# Patient Record
Sex: Female | Born: 1945
Health system: Southern US, Community
[De-identification: ages and names within clinical notes are randomized; demographics above are authoritative.]

## PROBLEM LIST (undated history)

## (undated) DIAGNOSIS — F32A Depression, unspecified: Secondary | ICD-10-CM

## (undated) DIAGNOSIS — K7460 Unspecified cirrhosis of liver: Secondary | ICD-10-CM

## (undated) DIAGNOSIS — M1990 Unspecified osteoarthritis, unspecified site: Secondary | ICD-10-CM

## (undated) DIAGNOSIS — B182 Chronic viral hepatitis C: Secondary | ICD-10-CM

## (undated) DIAGNOSIS — E785 Hyperlipidemia, unspecified: Secondary | ICD-10-CM

## (undated) DIAGNOSIS — F329 Major depressive disorder, single episode, unspecified: Secondary | ICD-10-CM

## (undated) DIAGNOSIS — E119 Type 2 diabetes mellitus without complications: Secondary | ICD-10-CM

## (undated) DIAGNOSIS — I10 Essential (primary) hypertension: Secondary | ICD-10-CM

## (undated) HISTORY — PX: ABDOMINAL HYSTERECTOMY: SHX81

## (undated) HISTORY — PX: BREAST SURGERY: SHX581

## (undated) HISTORY — DX: Hyperlipidemia, unspecified: E78.5

## (undated) HISTORY — PX: CHOLECYSTECTOMY: SHX55

## (undated) HISTORY — DX: Type 2 diabetes mellitus without complications: E11.9

## (undated) HISTORY — PX: APPENDECTOMY: SHX54

## (undated) HISTORY — DX: Unspecified cirrhosis of liver: K74.60

---

## 2001-01-28 ENCOUNTER — Emergency Department (HOSPITAL_COMMUNITY): Admission: EM | Admit: 2001-01-28 | Discharge: 2001-01-28 | Payer: Self-pay | Admitting: Emergency Medicine

## 2001-01-28 ENCOUNTER — Encounter: Payer: Self-pay | Admitting: Emergency Medicine

## 2001-10-07 ENCOUNTER — Encounter: Payer: Self-pay | Admitting: Emergency Medicine

## 2001-10-07 ENCOUNTER — Emergency Department (HOSPITAL_COMMUNITY): Admission: EM | Admit: 2001-10-07 | Discharge: 2001-10-07 | Payer: Self-pay | Admitting: Emergency Medicine

## 2002-03-28 ENCOUNTER — Encounter: Payer: Self-pay | Admitting: Emergency Medicine

## 2002-03-28 ENCOUNTER — Emergency Department (HOSPITAL_COMMUNITY): Admission: EM | Admit: 2002-03-28 | Discharge: 2002-03-28 | Payer: Self-pay | Admitting: Emergency Medicine

## 2003-04-14 ENCOUNTER — Encounter: Payer: Self-pay | Admitting: Emergency Medicine

## 2003-04-14 ENCOUNTER — Emergency Department (HOSPITAL_COMMUNITY): Admission: EM | Admit: 2003-04-14 | Discharge: 2003-04-14 | Payer: Self-pay | Admitting: Emergency Medicine

## 2004-09-15 ENCOUNTER — Emergency Department (HOSPITAL_COMMUNITY): Admission: EM | Admit: 2004-09-15 | Discharge: 2004-09-15 | Payer: Self-pay | Admitting: Emergency Medicine

## 2005-01-07 ENCOUNTER — Emergency Department (HOSPITAL_COMMUNITY): Admission: EM | Admit: 2005-01-07 | Discharge: 2005-01-07 | Payer: Self-pay | Admitting: Emergency Medicine

## 2005-08-17 ENCOUNTER — Emergency Department (HOSPITAL_COMMUNITY): Admission: EM | Admit: 2005-08-17 | Discharge: 2005-08-17 | Payer: Self-pay | Admitting: Emergency Medicine

## 2005-09-09 ENCOUNTER — Emergency Department (HOSPITAL_COMMUNITY): Admission: EM | Admit: 2005-09-09 | Discharge: 2005-09-10 | Payer: Self-pay | Admitting: Emergency Medicine

## 2005-11-08 ENCOUNTER — Emergency Department (HOSPITAL_COMMUNITY): Admission: EM | Admit: 2005-11-08 | Discharge: 2005-11-08 | Payer: Self-pay | Admitting: Emergency Medicine

## 2005-11-15 ENCOUNTER — Emergency Department (HOSPITAL_COMMUNITY): Admission: EM | Admit: 2005-11-15 | Discharge: 2005-11-15 | Payer: Self-pay | Admitting: Emergency Medicine

## 2005-12-26 ENCOUNTER — Emergency Department (HOSPITAL_COMMUNITY): Admission: EM | Admit: 2005-12-26 | Discharge: 2005-12-26 | Payer: Self-pay | Admitting: Emergency Medicine

## 2006-02-15 ENCOUNTER — Emergency Department (HOSPITAL_COMMUNITY): Admission: EM | Admit: 2006-02-15 | Discharge: 2006-02-15 | Payer: Self-pay | Admitting: Emergency Medicine

## 2006-04-04 ENCOUNTER — Emergency Department (HOSPITAL_COMMUNITY): Admission: EM | Admit: 2006-04-04 | Discharge: 2006-04-04 | Payer: Self-pay | Admitting: Emergency Medicine

## 2006-05-15 ENCOUNTER — Inpatient Hospital Stay (HOSPITAL_COMMUNITY): Admission: EM | Admit: 2006-05-15 | Discharge: 2006-05-30 | Payer: Self-pay | Admitting: Emergency Medicine

## 2006-05-23 ENCOUNTER — Ambulatory Visit: Payer: Self-pay | Admitting: Cardiology

## 2006-05-31 ENCOUNTER — Inpatient Hospital Stay (HOSPITAL_COMMUNITY): Admission: EM | Admit: 2006-05-31 | Discharge: 2006-06-06 | Payer: Self-pay | Admitting: Emergency Medicine

## 2006-05-31 ENCOUNTER — Encounter (HOSPITAL_COMMUNITY): Admission: RE | Admit: 2006-05-31 | Discharge: 2006-05-31 | Payer: Self-pay | Admitting: General Surgery

## 2006-05-31 ENCOUNTER — Ambulatory Visit: Payer: Self-pay | Admitting: Internal Medicine

## 2006-06-07 ENCOUNTER — Encounter (HOSPITAL_COMMUNITY): Admission: RE | Admit: 2006-06-07 | Discharge: 2006-07-07 | Payer: Self-pay | Admitting: General Surgery

## 2006-07-19 ENCOUNTER — Ambulatory Visit: Payer: Self-pay | Admitting: Gastroenterology

## 2006-07-24 ENCOUNTER — Ambulatory Visit (HOSPITAL_COMMUNITY): Admission: RE | Admit: 2006-07-24 | Discharge: 2006-07-24 | Payer: Self-pay | Admitting: Gastroenterology

## 2006-07-24 ENCOUNTER — Ambulatory Visit: Payer: Self-pay | Admitting: Internal Medicine

## 2006-11-05 ENCOUNTER — Emergency Department (HOSPITAL_COMMUNITY): Admission: EM | Admit: 2006-11-05 | Discharge: 2006-11-05 | Payer: Self-pay | Admitting: Emergency Medicine

## 2006-11-14 ENCOUNTER — Emergency Department (HOSPITAL_COMMUNITY): Admission: EM | Admit: 2006-11-14 | Discharge: 2006-11-14 | Payer: Self-pay | Admitting: Emergency Medicine

## 2007-01-29 ENCOUNTER — Emergency Department (HOSPITAL_COMMUNITY): Admission: EM | Admit: 2007-01-29 | Discharge: 2007-01-29 | Payer: Self-pay | Admitting: Emergency Medicine

## 2007-02-06 ENCOUNTER — Emergency Department (HOSPITAL_COMMUNITY): Admission: EM | Admit: 2007-02-06 | Discharge: 2007-02-06 | Payer: Self-pay | Admitting: Emergency Medicine

## 2007-02-26 ENCOUNTER — Emergency Department (HOSPITAL_COMMUNITY): Admission: EM | Admit: 2007-02-26 | Discharge: 2007-02-26 | Payer: Self-pay | Admitting: Emergency Medicine

## 2007-04-26 ENCOUNTER — Emergency Department (HOSPITAL_COMMUNITY): Admission: EM | Admit: 2007-04-26 | Discharge: 2007-04-26 | Payer: Self-pay | Admitting: Emergency Medicine

## 2007-07-31 ENCOUNTER — Ambulatory Visit (HOSPITAL_COMMUNITY): Admission: RE | Admit: 2007-07-31 | Discharge: 2007-07-31 | Payer: Self-pay | Admitting: Family Medicine

## 2007-11-09 ENCOUNTER — Emergency Department (HOSPITAL_COMMUNITY): Admission: EM | Admit: 2007-11-09 | Discharge: 2007-11-09 | Payer: Self-pay | Admitting: Emergency Medicine

## 2008-04-19 ENCOUNTER — Emergency Department (HOSPITAL_COMMUNITY): Admission: EM | Admit: 2008-04-19 | Discharge: 2008-04-19 | Payer: Self-pay | Admitting: Emergency Medicine

## 2008-06-10 ENCOUNTER — Emergency Department (HOSPITAL_COMMUNITY): Admission: EM | Admit: 2008-06-10 | Discharge: 2008-06-10 | Payer: Self-pay | Admitting: Emergency Medicine

## 2008-06-19 ENCOUNTER — Emergency Department (HOSPITAL_COMMUNITY): Admission: EM | Admit: 2008-06-19 | Discharge: 2008-06-19 | Payer: Self-pay | Admitting: Emergency Medicine

## 2008-09-15 ENCOUNTER — Emergency Department (HOSPITAL_COMMUNITY): Admission: EM | Admit: 2008-09-15 | Discharge: 2008-09-15 | Payer: Self-pay | Admitting: Emergency Medicine

## 2008-12-22 ENCOUNTER — Ambulatory Visit (HOSPITAL_COMMUNITY): Admission: RE | Admit: 2008-12-22 | Discharge: 2008-12-22 | Payer: Self-pay | Admitting: Family Medicine

## 2009-02-15 ENCOUNTER — Emergency Department (HOSPITAL_COMMUNITY): Admission: EM | Admit: 2009-02-15 | Discharge: 2009-02-15 | Payer: Self-pay | Admitting: Emergency Medicine

## 2009-04-14 ENCOUNTER — Inpatient Hospital Stay (HOSPITAL_COMMUNITY): Admission: EM | Admit: 2009-04-14 | Discharge: 2009-04-23 | Payer: Self-pay | Admitting: Emergency Medicine

## 2009-10-20 ENCOUNTER — Emergency Department (HOSPITAL_COMMUNITY): Admission: EM | Admit: 2009-10-20 | Discharge: 2009-10-20 | Payer: Self-pay | Admitting: Family Medicine

## 2009-10-28 ENCOUNTER — Other Ambulatory Visit: Admission: RE | Admit: 2009-10-28 | Discharge: 2009-10-28 | Payer: Self-pay | Admitting: Otolaryngology

## 2009-11-05 ENCOUNTER — Ambulatory Visit (HOSPITAL_COMMUNITY): Admission: RE | Admit: 2009-11-05 | Discharge: 2009-11-05 | Payer: Self-pay | Admitting: Otolaryngology

## 2009-11-12 ENCOUNTER — Other Ambulatory Visit: Admission: RE | Admit: 2009-11-12 | Discharge: 2009-11-12 | Payer: Self-pay | Admitting: Otolaryngology

## 2009-12-02 ENCOUNTER — Emergency Department (HOSPITAL_COMMUNITY): Admission: EM | Admit: 2009-12-02 | Discharge: 2009-12-02 | Payer: Self-pay | Admitting: Emergency Medicine

## 2009-12-15 ENCOUNTER — Emergency Department (HOSPITAL_COMMUNITY): Admission: EM | Admit: 2009-12-15 | Discharge: 2009-12-15 | Payer: Self-pay | Admitting: Emergency Medicine

## 2010-01-16 ENCOUNTER — Emergency Department (HOSPITAL_COMMUNITY): Admission: EM | Admit: 2010-01-16 | Discharge: 2010-01-16 | Payer: Self-pay | Admitting: Emergency Medicine

## 2010-02-07 ENCOUNTER — Emergency Department (HOSPITAL_COMMUNITY): Admission: EM | Admit: 2010-02-07 | Discharge: 2010-02-07 | Payer: Self-pay | Admitting: Emergency Medicine

## 2010-04-09 ENCOUNTER — Ambulatory Visit (HOSPITAL_COMMUNITY): Admission: RE | Admit: 2010-04-09 | Discharge: 2010-04-09 | Payer: Self-pay | Admitting: Otolaryngology

## 2010-04-23 ENCOUNTER — Emergency Department (HOSPITAL_COMMUNITY): Admission: EM | Admit: 2010-04-23 | Discharge: 2010-04-24 | Payer: Self-pay | Admitting: Emergency Medicine

## 2010-08-05 ENCOUNTER — Emergency Department (HOSPITAL_COMMUNITY)
Admission: EM | Admit: 2010-08-05 | Discharge: 2010-08-05 | Payer: Self-pay | Source: Home / Self Care | Admitting: Emergency Medicine

## 2010-10-03 ENCOUNTER — Emergency Department (HOSPITAL_COMMUNITY)
Admission: EM | Admit: 2010-10-03 | Discharge: 2010-10-03 | Disposition: A | Discharge status: Home / Self Care | Payer: Self-pay | Attending: Emergency Medicine | Admitting: Emergency Medicine

## 2010-10-03 DIAGNOSIS — T148XXA Other injury of unspecified body region, initial encounter: Secondary | ICD-10-CM | POA: Insufficient documentation

## 2010-10-03 DIAGNOSIS — I10 Essential (primary) hypertension: Secondary | ICD-10-CM | POA: Insufficient documentation

## 2010-10-03 DIAGNOSIS — F17200 Nicotine dependence, unspecified, uncomplicated: Secondary | ICD-10-CM | POA: Insufficient documentation

## 2010-10-03 DIAGNOSIS — Z8614 Personal history of Methicillin resistant Staphylococcus aureus infection: Secondary | ICD-10-CM | POA: Insufficient documentation

## 2010-10-03 DIAGNOSIS — Y92410 Unspecified street and highway as the place of occurrence of the external cause: Secondary | ICD-10-CM | POA: Insufficient documentation

## 2010-10-25 ENCOUNTER — Other Ambulatory Visit (HOSPITAL_COMMUNITY): Payer: Self-pay | Admitting: *Deleted

## 2010-10-25 DIAGNOSIS — Z139 Encounter for screening, unspecified: Secondary | ICD-10-CM

## 2010-10-26 ENCOUNTER — Ambulatory Visit (HOSPITAL_COMMUNITY): Payer: Self-pay

## 2010-11-04 ENCOUNTER — Emergency Department (HOSPITAL_COMMUNITY): Payer: Medicaid Other

## 2010-11-04 ENCOUNTER — Emergency Department (HOSPITAL_COMMUNITY)
Admission: EM | Admit: 2010-11-04 | Discharge: 2010-11-04 | Disposition: A | Discharge status: Home / Self Care | Payer: Medicaid Other | Attending: Emergency Medicine | Admitting: Emergency Medicine

## 2010-11-04 DIAGNOSIS — R071 Chest pain on breathing: Secondary | ICD-10-CM | POA: Insufficient documentation

## 2010-11-04 DIAGNOSIS — J45909 Unspecified asthma, uncomplicated: Secondary | ICD-10-CM | POA: Insufficient documentation

## 2010-11-04 DIAGNOSIS — M129 Arthropathy, unspecified: Secondary | ICD-10-CM | POA: Insufficient documentation

## 2010-11-04 DIAGNOSIS — R079 Chest pain, unspecified: Secondary | ICD-10-CM | POA: Insufficient documentation

## 2010-11-04 DIAGNOSIS — K219 Gastro-esophageal reflux disease without esophagitis: Secondary | ICD-10-CM | POA: Insufficient documentation

## 2010-11-04 LAB — POCT CARDIAC MARKERS
CKMB, poc: 2.5 ng/mL (ref 1.0–8.0)
CKMB, poc: 1.8 ng/mL (ref 1.0–8.0)
CKMB, poc: 2.4 ng/mL (ref 1.0–8.0)
Myoglobin, poc: 120 ng/mL (ref 12–200)
Myoglobin, poc: 216 ng/mL (ref 12–200)
Myoglobin, poc: 358 ng/mL (ref 12–200)
Troponin i, poc: <0.05 ng/mL (ref 0.00–0.09)
Troponin i, poc: <0.05 ng/mL (ref 0.00–0.09)
Troponin i, poc: <0.05 ng/mL (ref 0.00–0.09)

## 2010-11-04 LAB — URINE CULTURE
Colony Count: 25000
Culture  Setup Time: 201109032002

## 2010-11-04 LAB — BASIC METABOLIC PANEL
BUN: 13 mg/dL (ref 6–23)
BUN: 17 mg/dL (ref 6–23)
CO2: 23 mEq/L (ref 19–32)
CO2: 26 mEq/L (ref 19–32)
Calcium: 8.7 mg/dL (ref 8.4–10.5)
Calcium: 8.9 mg/dL (ref 8.4–10.5)
Chloride: 103 mEq/L (ref 96–112)
Chloride: 109 mEq/L (ref 96–112)
Creatinine, Ser: 0.74 mg/dL (ref 0.4–1.2)
Creatinine, Ser: 1.31 mg/dL — ABNORMAL HIGH (ref 0.4–1.2)
GFR calc Af Amer: >60 mL/min (ref >60)
GFR calc Af Amer: 50 mL/min — ABNORMAL LOW (ref >60)
GFR calc non Af Amer: >60 mL/min (ref >60)
GFR calc non Af Amer: 41 mL/min — ABNORMAL LOW (ref >60)
Glucose, Bld: 108 mg/dL — ABNORMAL HIGH (ref 70–99)
Glucose, Bld: 102 mg/dL — ABNORMAL HIGH (ref 70–99)
Potassium: 3.3 mEq/L — ABNORMAL LOW (ref 3.5–5.1)
Potassium: 3.7 mEq/L (ref 3.5–5.1)
Sodium: 136 mEq/L (ref 135–145)
Sodium: 140 mEq/L (ref 135–145)

## 2010-11-04 LAB — CBC
HCT: 39.4 % (ref 36.0–46.0)
HCT: 39.4 % (ref 36.0–46.0)
Hemoglobin: 13.1 g/dL (ref 12.0–15.0)
Hemoglobin: 13.1 g/dL (ref 12.0–15.0)
MCH: 29.2 pg (ref 26.0–34.0)
MCH: 30 pg (ref 26.0–34.0)
MCHC: 33.2 g/dL (ref 30.0–36.0)
MCHC: 33.2 g/dL (ref 30.0–36.0)
MCV: 87.9 fL (ref 78.0–100.0)
MCV: 90.6 fL (ref 78.0–100.0)
Platelets: 258 10*3/uL (ref 150–400)
Platelets: 266 10*3/uL (ref 150–400)
RBC: 4.48 MIL/uL (ref 3.87–5.11)
RBC: 4.34 MIL/uL (ref 3.87–5.11)
RDW: 12 % (ref 11.5–15.5)
RDW: 12.7 % (ref 11.5–15.5)
WBC: 7.9 10*3/uL (ref 4.0–10.5)
WBC: 15.7 10*3/uL — ABNORMAL HIGH (ref 4.0–10.5)

## 2010-11-04 LAB — DIFFERENTIAL
Basophils Absolute: 0 10*3/uL (ref 0.0–0.1)
Basophils Absolute: 0 10*3/uL (ref 0.0–0.1)
Basophils Relative: 0 % (ref 0–1)
Basophils Relative: 0 % (ref 0–1)
Eosinophils Absolute: 0.2 10*3/uL (ref 0.0–0.7)
Eosinophils Absolute: 0.2 10*3/uL (ref 0.0–0.7)
Eosinophils Relative: 1 % (ref 0–5)
Eosinophils Relative: 3 % (ref 0–5)
Lymphocytes Relative: 20 % (ref 12–46)
Lymphocytes Relative: 38 % (ref 12–46)
Lymphs Abs: 3 10*3/uL (ref 0.7–4.0)
Lymphs Abs: 3.1 10*3/uL (ref 0.7–4.0)
Monocytes Absolute: 0.9 10*3/uL (ref 0.1–1.0)
Monocytes Absolute: 1.4 10*3/uL — ABNORMAL HIGH (ref 0.1–1.0)
Monocytes Relative: 11 % (ref 3–12)
Monocytes Relative: 9 % (ref 3–12)
Neutro Abs: 11.1 10*3/uL — ABNORMAL HIGH (ref 1.7–7.7)
Neutro Abs: 3.8 10*3/uL (ref 1.7–7.7)
Neutrophils Relative %: 48 % (ref 43–77)
Neutrophils Relative %: 70 % (ref 43–77)

## 2010-11-04 LAB — URINALYSIS, ROUTINE W REFLEX MICROSCOPIC
Bilirubin Urine: NEGATIVE
Glucose, UA: NEGATIVE mg/dL
Hgb urine dipstick: NEGATIVE
Nitrite: NEGATIVE
Protein, ur: NEGATIVE mg/dL
Specific Gravity, Urine: 1.025 (ref 1.005–1.030)
Urobilinogen, UA: 0.2 mg/dL (ref 0.0–1.0)
pH: 5.5 (ref 5.0–8.0)

## 2010-11-09 LAB — COMPREHENSIVE METABOLIC PANEL
ALT: 40 U/L — ABNORMAL HIGH (ref 0–35)
AST: 43 U/L — ABNORMAL HIGH (ref 0–37)
Albumin: 3.2 g/dL — ABNORMAL LOW (ref 3.5–5.2)
Alkaline Phosphatase: 106 U/L (ref 39–117)
BUN: 8 mg/dL (ref 6–23)
CO2: 26 mEq/L (ref 19–32)
Calcium: 8.8 mg/dL (ref 8.4–10.5)
Chloride: 106 mEq/L (ref 96–112)
Creatinine, Ser: 0.81 mg/dL (ref 0.4–1.2)
GFR calc Af Amer: >60 mL/min (ref >60)
GFR calc non Af Amer: >60 mL/min (ref >60)
Glucose, Bld: 100 mg/dL — ABNORMAL HIGH (ref 70–99)
Potassium: 3.8 mEq/L (ref 3.5–5.1)
Sodium: 136 mEq/L (ref 135–145)
Total Bilirubin: 0.4 mg/dL (ref 0.3–1.2)
Total Protein: 8.5 g/dL — ABNORMAL HIGH (ref 6.0–8.3)

## 2010-11-09 LAB — DIFFERENTIAL
Basophils Absolute: 0 10*3/uL (ref 0.0–0.1)
Basophils Relative: 0 % (ref 0–1)
Eosinophils Absolute: 0.3 10*3/uL (ref 0.0–0.7)
Eosinophils Relative: 3 % (ref 0–5)
Lymphocytes Relative: 16 % (ref 12–46)
Lymphs Abs: 1.8 10*3/uL (ref 0.7–4.0)
Monocytes Absolute: 1.1 10*3/uL — ABNORMAL HIGH (ref 0.1–1.0)
Monocytes Relative: 10 % (ref 3–12)
Neutro Abs: 7.9 10*3/uL — ABNORMAL HIGH (ref 1.7–7.7)
Neutrophils Relative %: 71 % (ref 43–77)

## 2010-11-09 LAB — BLOOD GAS, ARTERIAL
Acid-Base Excess: 0.1 mmol/L (ref 0.0–2.0)
Bicarbonate: 24 mEq/L (ref 20.0–24.0)
FIO2: 0.21 %
O2 Saturation: 97.1 %
Patient temperature: 37
TCO2: 21.6 mmol/L (ref 0–100)
pCO2 arterial: 37.8 mmHg (ref 35.0–45.0)
pH, Arterial: 7.42 — ABNORMAL HIGH (ref 7.350–7.400)
pO2, Arterial: 84.9 mmHg (ref 80.0–100.0)

## 2010-11-09 LAB — TROPONIN I: Troponin I: 0.01 ng/mL (ref 0.00–0.06)

## 2010-11-09 LAB — CBC
HCT: 36.2 % (ref 36.0–46.0)
Hemoglobin: 12.5 g/dL (ref 12.0–15.0)
MCHC: 34.6 g/dL (ref 30.0–36.0)
MCV: 88.8 fL (ref 78.0–100.0)
Platelets: 217 10*3/uL (ref 150–400)
RBC: 4.07 MIL/uL (ref 3.87–5.11)
RDW: 12.3 % (ref 11.5–15.5)
WBC: 11.1 10*3/uL — ABNORMAL HIGH (ref 4.0–10.5)

## 2010-11-09 LAB — D-DIMER, QUANTITATIVE: D-Dimer, Quant: 0.34 ug/mL-FEU (ref 0.00–0.48)

## 2010-11-09 LAB — URINALYSIS, ROUTINE W REFLEX MICROSCOPIC
Bilirubin Urine: NEGATIVE
Glucose, UA: NEGATIVE mg/dL
Hgb urine dipstick: NEGATIVE
Ketones, ur: NEGATIVE mg/dL
Nitrite: NEGATIVE
Protein, ur: NEGATIVE mg/dL
Specific Gravity, Urine: 1.025 (ref 1.005–1.030)
Urobilinogen, UA: 0.2 mg/dL (ref 0.0–1.0)
pH: 5.5 (ref 5.0–8.0)

## 2010-11-09 LAB — BRAIN NATRIURETIC PEPTIDE: Pro B Natriuretic peptide (BNP): 92.1 pg/mL (ref 0.0–100.0)

## 2010-11-09 LAB — CK TOTAL AND CKMB (NOT AT ARMC)
CK, MB: 5.3 ng/mL — ABNORMAL HIGH (ref 0.3–4.0)
Relative Index: 1.3 (ref 0.0–2.5)
Total CK: 415 U/L — ABNORMAL HIGH (ref 7–177)

## 2010-11-10 LAB — CBC
HCT: 35.6 % — ABNORMAL LOW (ref 36.0–46.0)
Hemoglobin: 12.4 g/dL (ref 12.0–15.0)
MCHC: 34.7 g/dL (ref 30.0–36.0)
MCV: 89.1 fL (ref 78.0–100.0)
Platelets: 222 10*3/uL (ref 150–400)
RBC: 4 MIL/uL (ref 3.87–5.11)
RDW: 12.4 % (ref 11.5–15.5)
WBC: 10.1 10*3/uL (ref 4.0–10.5)

## 2010-11-10 LAB — BASIC METABOLIC PANEL
BUN: 12 mg/dL (ref 6–23)
CO2: 27 mEq/L (ref 19–32)
Calcium: 8.6 mg/dL (ref 8.4–10.5)
Chloride: 105 mEq/L (ref 96–112)
Creatinine, Ser: 0.82 mg/dL (ref 0.4–1.2)
GFR calc Af Amer: >60 mL/min (ref >60)
GFR calc non Af Amer: >60 mL/min (ref >60)
Glucose, Bld: 130 mg/dL — ABNORMAL HIGH (ref 70–99)
Potassium: 2.7 mEq/L — CL (ref 3.5–5.1)
Sodium: 138 mEq/L (ref 135–145)

## 2010-11-10 LAB — POCT CARDIAC MARKERS
CKMB, poc: 2.5 ng/mL (ref 1.0–8.0)
CKMB, poc: 3.6 ng/mL (ref 1.0–8.0)
Myoglobin, poc: 161 ng/mL (ref 12–200)
Myoglobin, poc: 178 ng/mL (ref 12–200)
Troponin i, poc: <0.05 ng/mL (ref 0.00–0.09)
Troponin i, poc: <0.05 ng/mL (ref 0.00–0.09)

## 2010-11-10 LAB — DIFFERENTIAL
Basophils Absolute: 0 10*3/uL (ref 0.0–0.1)
Basophils Relative: 0 % (ref 0–1)
Eosinophils Absolute: 0.3 10*3/uL (ref 0.0–0.7)
Eosinophils Relative: 3 % (ref 0–5)
Lymphocytes Relative: 34 % (ref 12–46)
Lymphs Abs: 3.5 10*3/uL (ref 0.7–4.0)
Monocytes Absolute: 0.7 10*3/uL (ref 0.1–1.0)
Monocytes Relative: 7 % (ref 3–12)
Neutro Abs: 5.7 10*3/uL (ref 1.7–7.7)
Neutrophils Relative %: 56 % (ref 43–77)

## 2010-11-15 LAB — POCT RAPID STREP A (OFFICE): Streptococcus, Group A Screen (Direct): NEGATIVE

## 2010-11-26 LAB — DIFFERENTIAL
Basophils Absolute: 0 10*3/uL (ref 0.0–0.1)
Basophils Absolute: 0 10*3/uL (ref 0.0–0.1)
Basophils Relative: 0 % (ref 0–1)
Basophils Relative: 0 % (ref 0–1)
Eosinophils Absolute: 0.5 10*3/uL (ref 0.0–0.7)
Eosinophils Absolute: 0.6 10*3/uL (ref 0.0–0.7)
Eosinophils Relative: 11 % — ABNORMAL HIGH (ref 0–5)
Eosinophils Relative: 12 % — ABNORMAL HIGH (ref 0–5)
Lymphocytes Relative: 16 % (ref 12–46)
Lymphocytes Relative: 19 % (ref 12–46)
Lymphs Abs: 0.8 10*3/uL (ref 0.7–4.0)
Lymphs Abs: 1 10*3/uL (ref 0.7–4.0)
Monocytes Absolute: 0.2 10*3/uL (ref 0.1–1.0)
Monocytes Absolute: 0.4 10*3/uL (ref 0.1–1.0)
Monocytes Relative: 4 % (ref 3–12)
Monocytes Relative: 8 % (ref 3–12)
Neutro Abs: 3.2 10*3/uL (ref 1.7–7.7)
Neutro Abs: 3.4 10*3/uL (ref 1.7–7.7)
Neutrophils Relative %: 63 % (ref 43–77)
Neutrophils Relative %: 68 % (ref 43–77)

## 2010-11-26 LAB — CBC
HCT: 26.8 % — ABNORMAL LOW (ref 36.0–46.0)
HCT: 27.3 % — ABNORMAL LOW (ref 36.0–46.0)
Hemoglobin: 9.1 g/dL — ABNORMAL LOW (ref 12.0–15.0)
Hemoglobin: 9.3 g/dL — ABNORMAL LOW (ref 12.0–15.0)
MCHC: 34 g/dL (ref 30.0–36.0)
MCHC: 34.2 g/dL (ref 30.0–36.0)
MCV: 94.1 fL (ref 78.0–100.0)
MCV: 94.3 fL (ref 78.0–100.0)
Platelets: 249 10*3/uL (ref 150–400)
Platelets: 251 10*3/uL (ref 150–400)
RBC: 2.84 MIL/uL — ABNORMAL LOW (ref 3.87–5.11)
RBC: 2.9 MIL/uL — ABNORMAL LOW (ref 3.87–5.11)
RDW: 14.1 % (ref 11.5–15.5)
RDW: 14.5 % (ref 11.5–15.5)
WBC: 5 10*3/uL (ref 4.0–10.5)
WBC: 5.1 10*3/uL (ref 4.0–10.5)

## 2010-11-26 LAB — COMPREHENSIVE METABOLIC PANEL
ALT: 17 U/L (ref 0–35)
AST: 22 U/L (ref 0–37)
Albumin: 2.6 g/dL — ABNORMAL LOW (ref 3.5–5.2)
Alkaline Phosphatase: 58 U/L (ref 39–117)
BUN: 9 mg/dL (ref 6–23)
CO2: 21 mEq/L (ref 19–32)
Calcium: 8.4 mg/dL (ref 8.4–10.5)
Chloride: 108 mEq/L (ref 96–112)
Creatinine, Ser: 0.93 mg/dL (ref 0.4–1.2)
GFR calc Af Amer: >60 mL/min (ref >60)
GFR calc non Af Amer: >60 mL/min (ref >60)
Glucose, Bld: 101 mg/dL — ABNORMAL HIGH (ref 70–99)
Potassium: 4.1 mEq/L (ref 3.5–5.1)
Sodium: 136 mEq/L (ref 135–145)
Total Bilirubin: 0.3 mg/dL (ref 0.3–1.2)
Total Protein: 7.4 g/dL (ref 6.0–8.3)

## 2010-11-26 LAB — BASIC METABOLIC PANEL
BUN: 9 mg/dL (ref 6–23)
CO2: 20 mEq/L (ref 19–32)
Calcium: 8.2 mg/dL — ABNORMAL LOW (ref 8.4–10.5)
Chloride: 105 mEq/L (ref 96–112)
Creatinine, Ser: 0.97 mg/dL (ref 0.4–1.2)
GFR calc Af Amer: >60 mL/min (ref >60)
GFR calc non Af Amer: 58 mL/min — ABNORMAL LOW (ref >60)
Glucose, Bld: 160 mg/dL — ABNORMAL HIGH (ref 70–99)
Potassium: 3.5 mEq/L (ref 3.5–5.1)
Sodium: 133 mEq/L — ABNORMAL LOW (ref 135–145)

## 2010-11-27 LAB — BLOOD GAS, ARTERIAL
Acid-base deficit: 4.1 mmol/L — ABNORMAL HIGH (ref 0.0–2.0)
Bicarbonate: 19.8 mEq/L — ABNORMAL LOW (ref 20.0–24.0)
O2 Content: 8 L/min
O2 Saturation: 98.9 %
Patient temperature: 37
TCO2: 18.3 mmol/L (ref 0–100)
pCO2 arterial: 32.1 mmHg — ABNORMAL LOW (ref 35.0–45.0)
pH, Arterial: 7.407 — ABNORMAL HIGH (ref 7.350–7.400)
pO2, Arterial: 122 mmHg — ABNORMAL HIGH (ref 80.0–100.0)

## 2010-11-27 LAB — CBC
HCT: 21.8 % — ABNORMAL LOW (ref 36.0–46.0)
HCT: 24.8 % — ABNORMAL LOW (ref 36.0–46.0)
HCT: 25.3 % — ABNORMAL LOW (ref 36.0–46.0)
HCT: 25.3 % — ABNORMAL LOW (ref 36.0–46.0)
HCT: 25.7 % — ABNORMAL LOW (ref 36.0–46.0)
HCT: 26 % — ABNORMAL LOW (ref 36.0–46.0)
HCT: 27.4 % — ABNORMAL LOW (ref 36.0–46.0)
HCT: 27.5 % — ABNORMAL LOW (ref 36.0–46.0)
HCT: 27.9 % — ABNORMAL LOW (ref 36.0–46.0)
Hemoglobin: 7.5 g/dL — CL (ref 12.0–15.0)
Hemoglobin: 8.5 g/dL — ABNORMAL LOW (ref 12.0–15.0)
Hemoglobin: 8.6 g/dL — ABNORMAL LOW (ref 12.0–15.0)
Hemoglobin: 8.8 g/dL — ABNORMAL LOW (ref 12.0–15.0)
Hemoglobin: 8.8 g/dL — ABNORMAL LOW (ref 12.0–15.0)
Hemoglobin: 8.8 g/dL — ABNORMAL LOW (ref 12.0–15.0)
Hemoglobin: 9.4 g/dL — ABNORMAL LOW (ref 12.0–15.0)
Hemoglobin: 9.4 g/dL — ABNORMAL LOW (ref 12.0–15.0)
Hemoglobin: 9.6 g/dL — ABNORMAL LOW (ref 12.0–15.0)
MCHC: 33.9 g/dL (ref 30.0–36.0)
MCHC: 34 g/dL (ref 30.0–36.0)
MCHC: 34.2 g/dL (ref 30.0–36.0)
MCHC: 34.2 g/dL (ref 30.0–36.0)
MCHC: 34.2 g/dL (ref 30.0–36.0)
MCHC: 34.3 g/dL (ref 30.0–36.0)
MCHC: 34.3 g/dL (ref 30.0–36.0)
MCHC: 34.4 g/dL (ref 30.0–36.0)
MCHC: 34.7 g/dL (ref 30.0–36.0)
MCV: 92.5 fL (ref 78.0–100.0)
MCV: 92.9 fL (ref 78.0–100.0)
MCV: 93.6 fL (ref 78.0–100.0)
MCV: 93.6 fL (ref 78.0–100.0)
MCV: 93.8 fL (ref 78.0–100.0)
MCV: 94 fL (ref 78.0–100.0)
MCV: 94 fL (ref 78.0–100.0)
MCV: 96 fL (ref 78.0–100.0)
MCV: 96 fL (ref 78.0–100.0)
Platelets: 177 10*3/uL (ref 150–400)
Platelets: 179 10*3/uL (ref 150–400)
Platelets: 183 10*3/uL (ref 150–400)
Platelets: 185 10*3/uL (ref 150–400)
Platelets: 191 10*3/uL (ref 150–400)
Platelets: 205 10*3/uL (ref 150–400)
Platelets: 226 10*3/uL (ref 150–400)
Platelets: 243 10*3/uL (ref 150–400)
Platelets: 252 10*3/uL (ref 150–400)
RBC: 2.27 MIL/uL — ABNORMAL LOW (ref 3.87–5.11)
RBC: 2.64 MIL/uL — ABNORMAL LOW (ref 3.87–5.11)
RBC: 2.64 MIL/uL — ABNORMAL LOW (ref 3.87–5.11)
RBC: 2.73 MIL/uL — ABNORMAL LOW (ref 3.87–5.11)
RBC: 2.74 MIL/uL — ABNORMAL LOW (ref 3.87–5.11)
RBC: 2.77 MIL/uL — ABNORMAL LOW (ref 3.87–5.11)
RBC: 2.92 MIL/uL — ABNORMAL LOW (ref 3.87–5.11)
RBC: 2.96 MIL/uL — ABNORMAL LOW (ref 3.87–5.11)
RBC: 2.98 MIL/uL — ABNORMAL LOW (ref 3.87–5.11)
RDW: 14.1 % (ref 11.5–15.5)
RDW: 14.1 % (ref 11.5–15.5)
RDW: 14.3 % (ref 11.5–15.5)
RDW: 14.4 % (ref 11.5–15.5)
RDW: 14.5 % (ref 11.5–15.5)
RDW: 14.5 % (ref 11.5–15.5)
RDW: 14.8 % (ref 11.5–15.5)
RDW: 14.9 % (ref 11.5–15.5)
RDW: 15 % (ref 11.5–15.5)
WBC: 11.7 10*3/uL — ABNORMAL HIGH (ref 4.0–10.5)
WBC: 5.3 10*3/uL (ref 4.0–10.5)
WBC: 5.4 10*3/uL (ref 4.0–10.5)
WBC: 5.5 10*3/uL (ref 4.0–10.5)
WBC: 6.3 10*3/uL (ref 4.0–10.5)
WBC: 6.7 10*3/uL (ref 4.0–10.5)
WBC: 7 10*3/uL (ref 4.0–10.5)
WBC: 9.4 10*3/uL (ref 4.0–10.5)
WBC: 9.4 10*3/uL (ref 4.0–10.5)

## 2010-11-27 LAB — DIFFERENTIAL
Basophils Absolute: 0 10*3/uL (ref 0.0–0.1)
Basophils Absolute: 0 10*3/uL (ref 0.0–0.1)
Basophils Absolute: 0 10*3/uL (ref 0.0–0.1)
Basophils Absolute: 0 10*3/uL (ref 0.0–0.1)
Basophils Absolute: 0 10*3/uL (ref 0.0–0.1)
Basophils Absolute: 0 10*3/uL (ref 0.0–0.1)
Basophils Absolute: 0 10*3/uL (ref 0.0–0.1)
Basophils Absolute: 0 10*3/uL (ref 0.0–0.1)
Basophils Absolute: 0 10*3/uL (ref 0.0–0.1)
Basophils Relative: 0 % (ref 0–1)
Basophils Relative: 0 % (ref 0–1)
Basophils Relative: 0 % (ref 0–1)
Basophils Relative: 0 % (ref 0–1)
Basophils Relative: 0 % (ref 0–1)
Basophils Relative: 0 % (ref 0–1)
Basophils Relative: 0 % (ref 0–1)
Basophils Relative: 0 % (ref 0–1)
Basophils Relative: 0 % (ref 0–1)
Eosinophils Absolute: 0.1 10*3/uL (ref 0.0–0.7)
Eosinophils Absolute: 0.1 10*3/uL (ref 0.0–0.7)
Eosinophils Absolute: 0.1 10*3/uL (ref 0.0–0.7)
Eosinophils Absolute: 0.2 10*3/uL (ref 0.0–0.7)
Eosinophils Absolute: 0.3 10*3/uL (ref 0.0–0.7)
Eosinophils Absolute: 0.4 10*3/uL (ref 0.0–0.7)
Eosinophils Absolute: 0.5 10*3/uL (ref 0.0–0.7)
Eosinophils Absolute: 0.5 10*3/uL (ref 0.0–0.7)
Eosinophils Absolute: 0.6 10*3/uL (ref 0.0–0.7)
Eosinophils Relative: 1 % (ref 0–5)
Eosinophils Relative: 1 % (ref 0–5)
Eosinophils Relative: 1 % (ref 0–5)
Eosinophils Relative: 12 % — ABNORMAL HIGH (ref 0–5)
Eosinophils Relative: 2 % (ref 0–5)
Eosinophils Relative: 4 % (ref 0–5)
Eosinophils Relative: 7 % — ABNORMAL HIGH (ref 0–5)
Eosinophils Relative: 9 % — ABNORMAL HIGH (ref 0–5)
Eosinophils Relative: 9 % — ABNORMAL HIGH (ref 0–5)
Lymphocytes Relative: 10 % — ABNORMAL LOW (ref 12–46)
Lymphocytes Relative: 11 % — ABNORMAL LOW (ref 12–46)
Lymphocytes Relative: 15 % (ref 12–46)
Lymphocytes Relative: 15 % (ref 12–46)
Lymphocytes Relative: 16 % (ref 12–46)
Lymphocytes Relative: 17 % (ref 12–46)
Lymphocytes Relative: 21 % (ref 12–46)
Lymphocytes Relative: 27 % (ref 12–46)
Lymphocytes Relative: 7 % — ABNORMAL LOW (ref 12–46)
Lymphs Abs: 0.8 10*3/uL (ref 0.7–4.0)
Lymphs Abs: 0.8 10*3/uL (ref 0.7–4.0)
Lymphs Abs: 0.9 10*3/uL (ref 0.7–4.0)
Lymphs Abs: 0.9 10*3/uL (ref 0.7–4.0)
Lymphs Abs: 1 10*3/uL (ref 0.7–4.0)
Lymphs Abs: 1 10*3/uL (ref 0.7–4.0)
Lymphs Abs: 1.1 10*3/uL (ref 0.7–4.0)
Lymphs Abs: 1.1 10*3/uL (ref 0.7–4.0)
Lymphs Abs: 1.9 10*3/uL (ref 0.7–4.0)
Monocytes Absolute: 0.5 10*3/uL (ref 0.1–1.0)
Monocytes Absolute: 0.5 10*3/uL (ref 0.1–1.0)
Monocytes Absolute: 0.7 10*3/uL (ref 0.1–1.0)
Monocytes Absolute: 0.8 10*3/uL (ref 0.1–1.0)
Monocytes Absolute: 0.8 10*3/uL (ref 0.1–1.0)
Monocytes Absolute: 1 10*3/uL (ref 0.1–1.0)
Monocytes Absolute: 1.1 10*3/uL — ABNORMAL HIGH (ref 0.1–1.0)
Monocytes Absolute: 1.1 10*3/uL — ABNORMAL HIGH (ref 0.1–1.0)
Monocytes Absolute: 1.2 10*3/uL — ABNORMAL HIGH (ref 0.1–1.0)
Monocytes Relative: 10 % (ref 3–12)
Monocytes Relative: 10 % (ref 3–12)
Monocytes Relative: 11 % (ref 3–12)
Monocytes Relative: 12 % (ref 3–12)
Monocytes Relative: 12 % (ref 3–12)
Monocytes Relative: 14 % — ABNORMAL HIGH (ref 3–12)
Monocytes Relative: 15 % — ABNORMAL HIGH (ref 3–12)
Monocytes Relative: 9 % (ref 3–12)
Monocytes Relative: 9 % (ref 3–12)
Neutro Abs: 2.8 10*3/uL (ref 1.7–7.7)
Neutro Abs: 3.5 10*3/uL (ref 1.7–7.7)
Neutro Abs: 3.6 10*3/uL (ref 1.7–7.7)
Neutro Abs: 4 10*3/uL (ref 1.7–7.7)
Neutro Abs: 4.2 10*3/uL (ref 1.7–7.7)
Neutro Abs: 4.6 10*3/uL (ref 1.7–7.7)
Neutro Abs: 7.2 10*3/uL (ref 1.7–7.7)
Neutro Abs: 7.3 10*3/uL (ref 1.7–7.7)
Neutro Abs: 9.5 10*3/uL — ABNORMAL HIGH (ref 1.7–7.7)
Neutrophils Relative %: 52 % (ref 43–77)
Neutrophils Relative %: 57 % (ref 43–77)
Neutrophils Relative %: 65 % (ref 43–77)
Neutrophils Relative %: 66 % (ref 43–77)
Neutrophils Relative %: 67 % (ref 43–77)
Neutrophils Relative %: 69 % (ref 43–77)
Neutrophils Relative %: 76 % (ref 43–77)
Neutrophils Relative %: 77 % (ref 43–77)
Neutrophils Relative %: 81 % — ABNORMAL HIGH (ref 43–77)

## 2010-11-27 LAB — COMPREHENSIVE METABOLIC PANEL
ALT: 14 U/L (ref 0–35)
ALT: 15 U/L (ref 0–35)
ALT: 16 U/L (ref 0–35)
ALT: 16 U/L (ref 0–35)
ALT: 17 U/L (ref 0–35)
ALT: 18 U/L (ref 0–35)
ALT: 18 U/L (ref 0–35)
AST: 21 U/L (ref 0–37)
AST: 21 U/L (ref 0–37)
AST: 22 U/L (ref 0–37)
AST: 25 U/L (ref 0–37)
AST: 28 U/L (ref 0–37)
AST: 30 U/L (ref 0–37)
AST: 30 U/L (ref 0–37)
Albumin: 2.4 g/dL — ABNORMAL LOW (ref 3.5–5.2)
Albumin: 2.5 g/dL — ABNORMAL LOW (ref 3.5–5.2)
Albumin: 2.5 g/dL — ABNORMAL LOW (ref 3.5–5.2)
Albumin: 2.5 g/dL — ABNORMAL LOW (ref 3.5–5.2)
Albumin: 2.5 g/dL — ABNORMAL LOW (ref 3.5–5.2)
Albumin: 2.8 g/dL — ABNORMAL LOW (ref 3.5–5.2)
Albumin: 3.1 g/dL — ABNORMAL LOW (ref 3.5–5.2)
Alkaline Phosphatase: 58 U/L (ref 39–117)
Alkaline Phosphatase: 60 U/L (ref 39–117)
Alkaline Phosphatase: 62 U/L (ref 39–117)
Alkaline Phosphatase: 63 U/L (ref 39–117)
Alkaline Phosphatase: 65 U/L (ref 39–117)
Alkaline Phosphatase: 69 U/L (ref 39–117)
Alkaline Phosphatase: 75 U/L (ref 39–117)
BUN: 10 mg/dL (ref 6–23)
BUN: 11 mg/dL (ref 6–23)
BUN: 12 mg/dL (ref 6–23)
BUN: 12 mg/dL (ref 6–23)
BUN: 13 mg/dL (ref 6–23)
BUN: 14 mg/dL (ref 6–23)
BUN: 4 mg/dL — ABNORMAL LOW (ref 6–23)
CO2: 23 mEq/L (ref 19–32)
CO2: 23 mEq/L (ref 19–32)
CO2: 23 mEq/L (ref 19–32)
CO2: 23 mEq/L (ref 19–32)
CO2: 24 mEq/L (ref 19–32)
CO2: 26 mEq/L (ref 19–32)
CO2: 26 mEq/L (ref 19–32)
Calcium: 7.6 mg/dL — ABNORMAL LOW (ref 8.4–10.5)
Calcium: 7.9 mg/dL — ABNORMAL LOW (ref 8.4–10.5)
Calcium: 8 mg/dL — ABNORMAL LOW (ref 8.4–10.5)
Calcium: 8 mg/dL — ABNORMAL LOW (ref 8.4–10.5)
Calcium: 8 mg/dL — ABNORMAL LOW (ref 8.4–10.5)
Calcium: 8.1 mg/dL — ABNORMAL LOW (ref 8.4–10.5)
Calcium: 8.2 mg/dL — ABNORMAL LOW (ref 8.4–10.5)
Chloride: 102 mEq/L (ref 96–112)
Chloride: 102 mEq/L (ref 96–112)
Chloride: 102 mEq/L (ref 96–112)
Chloride: 104 mEq/L (ref 96–112)
Chloride: 104 mEq/L (ref 96–112)
Chloride: 105 mEq/L (ref 96–112)
Chloride: 105 mEq/L (ref 96–112)
Creatinine, Ser: 1.01 mg/dL (ref 0.4–1.2)
Creatinine, Ser: 1.05 mg/dL (ref 0.4–1.2)
Creatinine, Ser: 1.07 mg/dL (ref 0.4–1.2)
Creatinine, Ser: 1.11 mg/dL (ref 0.4–1.2)
Creatinine, Ser: 1.11 mg/dL (ref 0.4–1.2)
Creatinine, Ser: 1.35 mg/dL — ABNORMAL HIGH (ref 0.4–1.2)
Creatinine, Ser: 1.43 mg/dL — ABNORMAL HIGH (ref 0.4–1.2)
GFR calc Af Amer: 45 mL/min — ABNORMAL LOW (ref >60)
GFR calc Af Amer: 48 mL/min — ABNORMAL LOW (ref >60)
GFR calc Af Amer: >60 mL/min (ref >60)
GFR calc Af Amer: >60 mL/min (ref >60)
GFR calc Af Amer: >60 mL/min (ref >60)
GFR calc Af Amer: >60 mL/min (ref >60)
GFR calc Af Amer: >60 mL/min (ref >60)
GFR calc non Af Amer: 37 mL/min — ABNORMAL LOW (ref >60)
GFR calc non Af Amer: 40 mL/min — ABNORMAL LOW (ref >60)
GFR calc non Af Amer: 50 mL/min — ABNORMAL LOW (ref >60)
GFR calc non Af Amer: 50 mL/min — ABNORMAL LOW (ref >60)
GFR calc non Af Amer: 52 mL/min — ABNORMAL LOW (ref >60)
GFR calc non Af Amer: 53 mL/min — ABNORMAL LOW (ref >60)
GFR calc non Af Amer: 56 mL/min — ABNORMAL LOW (ref >60)
Glucose, Bld: 100 mg/dL — ABNORMAL HIGH (ref 70–99)
Glucose, Bld: 106 mg/dL — ABNORMAL HIGH (ref 70–99)
Glucose, Bld: 111 mg/dL — ABNORMAL HIGH (ref 70–99)
Glucose, Bld: 112 mg/dL — ABNORMAL HIGH (ref 70–99)
Glucose, Bld: 123 mg/dL — ABNORMAL HIGH (ref 70–99)
Glucose, Bld: 86 mg/dL (ref 70–99)
Glucose, Bld: 88 mg/dL (ref 70–99)
Potassium: 3.2 mEq/L — ABNORMAL LOW (ref 3.5–5.1)
Potassium: 3.4 mEq/L — ABNORMAL LOW (ref 3.5–5.1)
Potassium: 3.6 mEq/L (ref 3.5–5.1)
Potassium: 3.7 mEq/L (ref 3.5–5.1)
Potassium: 3.8 mEq/L (ref 3.5–5.1)
Potassium: 3.9 mEq/L (ref 3.5–5.1)
Potassium: 3.9 mEq/L (ref 3.5–5.1)
Sodium: 132 mEq/L — ABNORMAL LOW (ref 135–145)
Sodium: 132 mEq/L — ABNORMAL LOW (ref 135–145)
Sodium: 133 mEq/L — ABNORMAL LOW (ref 135–145)
Sodium: 133 mEq/L — ABNORMAL LOW (ref 135–145)
Sodium: 134 mEq/L — ABNORMAL LOW (ref 135–145)
Sodium: 135 mEq/L (ref 135–145)
Sodium: 135 mEq/L (ref 135–145)
Total Bilirubin: 0.4 mg/dL (ref 0.3–1.2)
Total Bilirubin: 0.5 mg/dL (ref 0.3–1.2)
Total Bilirubin: 0.7 mg/dL (ref 0.3–1.2)
Total Bilirubin: 0.7 mg/dL (ref 0.3–1.2)
Total Bilirubin: 0.8 mg/dL (ref 0.3–1.2)
Total Bilirubin: 0.8 mg/dL (ref 0.3–1.2)
Total Bilirubin: 1.2 mg/dL (ref 0.3–1.2)
Total Protein: 7.1 g/dL (ref 6.0–8.3)
Total Protein: 7.2 g/dL (ref 6.0–8.3)
Total Protein: 7.5 g/dL (ref 6.0–8.3)
Total Protein: 7.7 g/dL (ref 6.0–8.3)
Total Protein: 7.8 g/dL (ref 6.0–8.3)
Total Protein: 8.1 g/dL (ref 6.0–8.3)
Total Protein: 8.6 g/dL — ABNORMAL HIGH (ref 6.0–8.3)

## 2010-11-27 LAB — URINALYSIS, ROUTINE W REFLEX MICROSCOPIC
Bilirubin Urine: NEGATIVE
Glucose, UA: NEGATIVE mg/dL
Hgb urine dipstick: NEGATIVE
Ketones, ur: NEGATIVE mg/dL
Leukocytes, UA: NEGATIVE
Nitrite: POSITIVE — AB
Protein, ur: NEGATIVE mg/dL
Specific Gravity, Urine: 1.01 (ref 1.005–1.030)
Urobilinogen, UA: 1 mg/dL (ref 0.0–1.0)
pH: 5.5 (ref 5.0–8.0)

## 2010-11-27 LAB — CROSSMATCH
ABO/RH(D): O POS
Antibody Screen: NEGATIVE

## 2010-11-27 LAB — TROPONIN I: Troponin I: 0.03 ng/mL (ref 0.00–0.06)

## 2010-11-27 LAB — FERRITIN: Ferritin: 574 ng/mL — ABNORMAL HIGH (ref 10–291)

## 2010-11-27 LAB — CULTURE, BLOOD (ROUTINE X 2)
Culture: NO GROWTH
Culture: NO GROWTH
Culture: NO GROWTH
Culture: NO GROWTH
Culture: NO GROWTH
Culture: NO GROWTH
Report Status: 8282010
Report Status: 8282010
Report Status: 8302010
Report Status: 8302010
Report Status: 9032010
Report Status: 9052010

## 2010-11-27 LAB — URINALYSIS, MICROSCOPIC ONLY
Bilirubin Urine: NEGATIVE
Glucose, UA: NEGATIVE mg/dL
Hgb urine dipstick: NEGATIVE
Ketones, ur: NEGATIVE mg/dL
Leukocytes, UA: NEGATIVE
Nitrite: NEGATIVE
Protein, ur: NEGATIVE mg/dL
Specific Gravity, Urine: 1.01 (ref 1.005–1.030)
Urobilinogen, UA: 0.2 mg/dL (ref 0.0–1.0)
pH: 5 (ref 5.0–8.0)

## 2010-11-27 LAB — BASIC METABOLIC PANEL
BUN: 5 mg/dL — ABNORMAL LOW (ref 6–23)
BUN: 8 mg/dL (ref 6–23)
CO2: 21 mEq/L (ref 19–32)
CO2: 25 mEq/L (ref 19–32)
Calcium: 7.9 mg/dL — ABNORMAL LOW (ref 8.4–10.5)
Calcium: 8.2 mg/dL — ABNORMAL LOW (ref 8.4–10.5)
Chloride: 105 mEq/L (ref 96–112)
Chloride: 107 mEq/L (ref 96–112)
Creatinine, Ser: 1.03 mg/dL (ref 0.4–1.2)
Creatinine, Ser: 1.08 mg/dL (ref 0.4–1.2)
GFR calc Af Amer: >60 mL/min (ref >60)
GFR calc Af Amer: >60 mL/min (ref >60)
GFR calc non Af Amer: 52 mL/min — ABNORMAL LOW (ref >60)
GFR calc non Af Amer: 54 mL/min — ABNORMAL LOW (ref >60)
Glucose, Bld: 104 mg/dL — ABNORMAL HIGH (ref 70–99)
Glucose, Bld: 127 mg/dL — ABNORMAL HIGH (ref 70–99)
Potassium: 3.1 mEq/L — ABNORMAL LOW (ref 3.5–5.1)
Potassium: 3.7 mEq/L (ref 3.5–5.1)
Sodium: 132 mEq/L — ABNORMAL LOW (ref 135–145)
Sodium: 134 mEq/L — ABNORMAL LOW (ref 135–145)

## 2010-11-27 LAB — SEDIMENTATION RATE: Sed Rate: 82 mm/hr — ABNORMAL HIGH (ref 0–22)

## 2010-11-27 LAB — BRAIN NATRIURETIC PEPTIDE: Pro B Natriuretic peptide (BNP): 472 pg/mL — ABNORMAL HIGH (ref 0.0–100.0)

## 2010-11-27 LAB — VANCOMYCIN, TROUGH: Vancomycin Tr: 11.7 ug/mL (ref 10.0–20.0)

## 2010-11-27 LAB — URINE CULTURE
Colony Count: >=100000
Colony Count: NO GROWTH
Culture: NO GROWTH

## 2010-11-27 LAB — IRON AND TIBC
Iron: 16 ug/dL — ABNORMAL LOW (ref 42–135)
Saturation Ratios: 9 % — ABNORMAL LOW (ref 20–55)
TIBC: 176 ug/dL — ABNORMAL LOW (ref 250–470)
UIBC: 160 ug/dL

## 2010-11-27 LAB — TSH: TSH: 2.276 u[IU]/mL (ref 0.350–4.500)

## 2010-11-27 LAB — D-DIMER, QUANTITATIVE (NOT AT ARMC): D-Dimer, Quant: 0.86 ug/mL-FEU — ABNORMAL HIGH (ref 0.00–0.48)

## 2010-11-27 LAB — ANA: Anti Nuclear Antibody(ANA): POSITIVE — AB

## 2010-11-27 LAB — CARDIAC PANEL(CRET KIN+CKTOT+MB+TROPI)
CK, MB: 0.7 ng/mL (ref 0.3–4.0)
Relative Index: INVALID (ref 0.0–2.5)
Total CK: 44 U/L (ref 7–177)
Troponin I: 0.04 ng/mL (ref 0.00–0.06)

## 2010-11-27 LAB — URINE MICROSCOPIC-ADD ON

## 2010-11-27 LAB — PROTIME-INR
INR: 1.1 (ref 0.00–1.49)
Prothrombin Time: 14.2 seconds (ref 11.6–15.2)

## 2010-11-27 LAB — AMMONIA: Ammonia: 19 umol/L (ref 11–35)

## 2010-11-27 LAB — CK: Total CK: 40 U/L (ref 7–177)

## 2010-11-27 LAB — T4, FREE: Free T4: 0.65 ng/dL — ABNORMAL LOW (ref 0.80–1.80)

## 2010-11-27 LAB — APTT: aPTT: 37 seconds (ref 24–37)

## 2010-11-27 LAB — LACTIC ACID, PLASMA: Lactic Acid, Venous: 1.8 mmol/L (ref 0.5–2.2)

## 2010-11-27 LAB — MAGNESIUM: Magnesium: 1.6 mg/dL (ref 1.5–2.5)

## 2010-11-27 LAB — ANTI-NUCLEAR AB-TITER (ANA TITER): ANA Titer 1: NEGATIVE

## 2010-11-27 LAB — LIPASE, BLOOD: Lipase: 14 U/L (ref 11–59)

## 2010-11-29 LAB — COMPREHENSIVE METABOLIC PANEL
ALT: 14 U/L (ref 0–35)
AST: 22 U/L (ref 0–37)
Albumin: 2.9 g/dL — ABNORMAL LOW (ref 3.5–5.2)
Alkaline Phosphatase: 81 U/L (ref 39–117)
BUN: 7 mg/dL (ref 6–23)
CO2: 25 mEq/L (ref 19–32)
Calcium: 8.3 mg/dL — ABNORMAL LOW (ref 8.4–10.5)
Chloride: 104 mEq/L (ref 96–112)
Creatinine, Ser: 0.77 mg/dL (ref 0.4–1.2)
GFR calc Af Amer: >60 mL/min (ref >60)
GFR calc non Af Amer: >60 mL/min (ref >60)
Glucose, Bld: 143 mg/dL — ABNORMAL HIGH (ref 70–99)
Potassium: 3.2 mEq/L — ABNORMAL LOW (ref 3.5–5.1)
Sodium: 136 mEq/L (ref 135–145)
Total Bilirubin: 0.4 mg/dL (ref 0.3–1.2)
Total Protein: 8 g/dL (ref 6.0–8.3)

## 2010-11-29 LAB — DIFFERENTIAL
Basophils Absolute: 0 10*3/uL (ref 0.0–0.1)
Basophils Relative: 0 % (ref 0–1)
Eosinophils Absolute: 0 10*3/uL (ref 0.0–0.7)
Eosinophils Relative: 0 % (ref 0–5)
Lymphocytes Relative: 32 % (ref 12–46)
Lymphs Abs: 1.4 10*3/uL (ref 0.7–4.0)
Monocytes Absolute: 0.6 10*3/uL (ref 0.1–1.0)
Monocytes Relative: 13 % — ABNORMAL HIGH (ref 3–12)
Neutro Abs: 2.4 10*3/uL (ref 1.7–7.7)
Neutrophils Relative %: 55 % (ref 43–77)

## 2010-11-29 LAB — CBC
HCT: 25 % — ABNORMAL LOW (ref 36.0–46.0)
Hemoglobin: 8.5 g/dL — ABNORMAL LOW (ref 12.0–15.0)
MCHC: 34 g/dL (ref 30.0–36.0)
MCV: 99.2 fL (ref 78.0–100.0)
Platelets: 223 10*3/uL (ref 150–400)
RBC: 2.52 MIL/uL — ABNORMAL LOW (ref 3.87–5.11)
RDW: 14.1 % (ref 11.5–15.5)
WBC: 4.5 10*3/uL (ref 4.0–10.5)

## 2011-01-04 NOTE — Discharge Summary (Signed)
NAME:  Megan Odom, Megan Odom            ACCOUNT NO.:  [id number]   MEDICAL RECORD NO.:  [id number]          PATIENT TYPE:  INP   LOCATION:  A211                          FACILITY:  [facility]   PHYSICIAN:  Melissa L. Ladona Ridgel, MD  DATE OF BIRTH:  10/28/1945   DATE OF ADMISSION:  04/13/2009  DATE OF DISCHARGE:  09/02/2010LH                               DISCHARGE SUMMARY   DISCHARGE DIAGNOSES:  1. Urinary tract infection with sepsis.  2. Hepatitis C, off antiviral medications.  3. Anemia.  4. Chronic headache  5. Weakness.  6. Chronic obstructive pulmonary disease.  7. Fever of uncertain origin.  8. Right ovarian cyst.   CONSULTS:  None.   PERTINENT RADIOLOGICAL EXAMS:  Chest x-ray that was done on admission  that showed hyperaerated lungs consistent with an element of COPD.  She  had a CT of her abdomen and pelvis done on August 31 that showed a mass  in the right pelvis.  This was further evaluated with a transvaginal  ultrasound on September 1 that showed 1.9 cm cyst associated with right  ovary had benign imaging characteristics.  Would recommend follow-up  ultrasound and 1 year.   PROCEDURES:  1. On August 26 the patient had a PICC line placed without any      complications.  2. On August 24 the patient received 2 units of O positive packed red      blood cells without any adverse side effects.   HISTORY AND BRIEF HOSPITAL COURSE:  Ms. Megan Odom is a 7-  year-old [place] American female who is obese with a history of  hepatitis C who had been feeling poorly since she started her antiviral  therapy in February 2010.  She had presented to the [facility] Emergency  Department on the 23rd with lethargy, abdominal pain.  She had been  having some vomiting.  She was admitted, started on IV Rocephin  empirically for urinary tract infection.  The next day, she was still  appearing lethargic.  Over the course of the next 24 hours, she  developed hypotension, became septic and  was transferred to the ICU.  After a brief stay in the ICU, the patient's hypotension resolved.  Her  fevers began to decrease but she still had them intermittently.  She did  have some left lower quadrant abdominal pain that persisted.  A CT  abdomen and pelvis was done.  She was found to have a right pelvic mass.  Vaginal ultrasound was done to follow up.  She was found to have a right  ovarian cyst.  The rest of her hospital course will be based on her  problem list.  1. Urinary tract infection with sepsis.  Her first urinary culture      grew out E. coli.  She was treated with Rocephin initially, then      put on both vancomycin and Levaquin.  She will be discharged with 4      more days of Levaquin.  She had a 2nd set of urinary cultures done      4 days into her hospitalization that  did not grow any bacteria.  2. Hepatitis C.  When the patient was admitted, she was on ribavirin      and peginterferon. These were discontinued at admission secondary      to what we thought were side effects from her antiviral.  She will      be discharged off these medications.  3. Chronic anemia.  The patient was admitted with a hemoglobin of 8.6.      This subsequently dropped to 7.5 overnight.  She received 2 units      of packed red blood cells.  Her hemoglobin has remained between 8.8      and 9.4 for the rest of her hospital stay.  This morning,  at      discharge, she has a hemoglobin of 9.1 and an MCV value of 94.3.      Further iron studies were done during her stay.  These revealed a      serum iron of 16, TIBC 176, serum ferritin 574.  She is being      maintained on Procrit monthly as an outpatient.  4. COPD.  The patient does still smoke tobacco.  She received Atrovent      and Ventolin breathing treatments as needed while she was here.  It      is noteworthy that she did have an elevated BNP of 472 on August      24.  I wonder if her wheeze has a component of CHF in it but in       general her COPD remained stable over the course of her      hospitalization.  She will be discharged on p.r.n. breathing      treatments that she has at home without any oral steroids.  5. Fever.  The patient intermittently spiked fevers during her      hospital stay.  These were thought to be associated with her      urinary tract infection and sepsis.  Once her urinary tract      infection had cleared, these decreased; however, she did spike a      fever of about 100 intermittently until or vancomycin was      discontinued.  Since that time, her highest temperature has been      99.1.  She has had no fevers for the past 48 hours.  6. Ovarian cyst.  The patient was complaining of some lower abdominal      pain towards the end of her hospitalization.  It was decided to      check this with a CT abdomen and pelvis at which time we found a      right pelvic mass.  This was followed with a transvaginal      ultrasound, which revealed a right ovarian cyst with benign      characteristics.  The patient should have a follow-up ultrasound in      approximately 1 year.   OTHER PERTINENT LABS:  The patient was noted to have a positive ANA.  Blood cultures drawn during her hospital stay were negative.  She had a  TSH of 2.276 with a free T4 of 0.65.  This morning when I saw Ms.  Odom, she is lying in bed, comfortable but complaining of a  headache.  She tells me she is ready to go home.  She has been eating  well.  She is able to ambulate out in the hallways somewhat; however, if  she exerts up to much, she will become short of breath.  She is clear-  headed, alert, oriented, asking about her disability and financial  circumstances.  She tells me she is ready for discharge.   PHYSICAL EXAM:  GENERAL:  This is a 65 year old African American, obese  female who appears somewhat sluggish still.  VITAL SIGNS:  This morning are temperature 99.4, pulse 71, respirations  24, blood pressure is  115/60.  HEENT:  Head is atraumatic normocephalic.  Eyes have muddy sclera but  are anicteric.  Pupils are equal, round, reactive to light.  Her  conjunctivae are pink.  Nose shows no nasal discharge.  Mouth has very  poor dentition, as a matter of fact, she has a broken tooth that causes  or some difficulty but she has moist mucous membranes, no ulcerations or  lesions.  NECK:  Supple without lymphadenopathy or JVD.  CHEST:  Shows no accessory muscle use.  She does have some minimal  wheeze with deep expiration posteriorly.  HEART:  Regular rate and rhythm with normal S1-S2, no murmurs, rubs or  gallops appreciated.  ABDOMEN:  Her abdomen is obese, minimally tender in her left lower  quadrant with good bowel sounds.  No distention.  LOWER EXTREMITIES: Show no lesions,  minimal edema in her distal tibial  areas bilaterally.  She has 2+ dorsalis pedis pulses.  PSYCHIATRIC:  The  patient's affect is rather flat.  She appears to have some anxiety about  her chronic disease; however, her long and short-term memory are intact.  She has no suicidal ideations.   DISPOSITION:  She will be discharged home today in stable condition.   FOLLOW UP:  She has an appointment with [facility] infectious disease  specialists in [place] on September 17 at 2:40 p.m.  This is with  Dr. Adrian Saran.  She also has an appointment with Dierdre Forth who is  a PA at the [facility].  This will be within  the next 2 weeks.  She will need to have both her hepatitis C status  reviewed as well as her blood pressure and anemia status.   DISCHARGE MEDICATIONS:  1. Procrit 400,000 units 1 dose monthly  2. Multivitamin 1 tablet daily.  3. Neurontin 300 mg 3 tablets daily.  4. Tramadol HCl/acetaminophen 1 tablet 50 mg 3 times daily.  5. Alprazolam, this dose has been decreased to 0.25 mg 1 tablet q.12 h      as needed for anxiety.  6. Clarinex 5 mg 1 tablet daily.  7. Proventil inhaler  as needed for wheeze.  8. Furosemide 40 mg 1 tablet daily.  9. Nexium 40 mg 1 tablet daily.  10.Phenergan 25 mg 1/2 tablet q.6 h as needed for nausea.  11.Paxil 40 mg 1 tablet daily.  12.Norvasc 5 mg 1 tablet daily.  13.Atropine solution 1% drops 1 in each eye twice a day.  14.Omnipred 1% ophthalmic solution 1 drop in each eye daily.  15.Levaquin 500 mg 1 tablet daily for the next 4 days.  Note, the patient has been taken off her Diovan.  She has been taken off  her Pegasys 180 mEq/0.5 mL.  She has been taken off of her ribavirin.  She has also been taken off of her potassium and had her alprazolam dose  cut to a 1/4 of what it was previously.   Approximately 45 minutes was spent on this discharge summary.   Addendum:  I have seen and evaluated this  patient.  I discussed the  discharge plan with the physician's assitant and I concur with the above  plan.   Melissa L. Ladona Ridgel, M.D.      Stephani Police, PA      Melissa L. Ladona Ridgel, MD  Electronically Signed    MLY/MEDQ  D:  04/23/2009  T:  04/23/2009  Job:  [id number]   cc:   [facility] Dept. Dierdre Forth, PA   Dr. Jethro Bolus [facility]  Marcy Panning

## 2011-01-04 NOTE — H&P (Signed)
NAME:  Megan Odom, Megan Odom NO.:  [id number]   MEDICAL RECORD NO.:  [id number]          PATIENT TYPE:  OBV   LOCATION:  A305                          FACILITY:  [facility]   PHYSICIAN:  Renee Ramus, MD       DATE OF BIRTH:  04-25-1946   DATE OF ADMISSION:  04/13/2009  DATE OF DISCHARGE:  LH                              HISTORY & PHYSICAL   PRIMARY CARE PHYSICIANS:  1. Dr. Freida Busman from the [facility] Department.  2. Dr. Lequita Halt from the [facility] Department.   HISTORY OF PRESENT ILLNESS:  The patient is a 65 year old female, who  complains of fever, increased fatigue x1 week prior to admission.  The  patient also complains of headache, emotional liability, feet myalgias,  and weakness.  The patient has been taking ribavirin and peginterferon  for hepatitis C that she received from needle stick.  The patient has  been on therapy for bouts of three-quarters of a year.  She reports that  she has been having these problems, but they have been getting  progressively worse.  She denies chest pain, shortness of breath, PND,  or orthopnea.  She denies diarrhea or constipation.  The patient will be  admitted for overnight observation and cessation of ribavirin and  peginterferon.   PAST MEDICAL HISTORY:  1. Anemia.  2. Hypertension.  3. Obstructive sleep apnea.  4. COPD.  5. Hepatitis C.  6. Gastroesophageal reflux disease.  7. Obesity.   SOCIAL HISTORY:  The patient reports smoking one third to half a pack  per day.  She denies alcohol use.   FAMILY HISTORY:  Not available.   REVIEW OF SYSTEMS:  All other comprehensive review of systems are  negative.   MEDICATIONS:  The patient has allergies to DARVOCET, PENICILLIN, and  TETRACYCLINE.   CURRENT MEDICATIONS:  1. Procrit 40,000 units subcu weekly as directed.  2. Peginterferon subcu weekly.  3. Nexium 40 mg p.o. daily.  4. Lasix 40 mg p.o. daily.  5. Potassium chloride 10 mEq p.o. daily.  6.  Clarinex 5 mg p.o. daily.  7. Bactrim double strength 1 p.o. b.i.d.  8. Norvasc 5 mg p.o. daily.  9. Ribavirin 200 mg 3 pills in the morning and 3 pills at night p.o.  10.Premarin 0.625 mg p.o. daily.  11.Neurontin 300 mg p.o. t.i.d.  12.Diovan/hydrochlorothiazide 180/12.5 one p.o. daily.  13.Tramadol 50 mg p.o. t.i.d. p.r.n. pain.  14.Proventil 1 puff inhaled q.6 h p.r.n. wheezes.   PHYSICAL EXAMINATION:  GENERAL:  This is a morbidly obese black female,  currently no distress.  VITAL SIGNS:  Blood pressure 121/56, heart rate 86, respiratory rate 20,  and temperature 101.2.  HEENT:  No jugular venous distention.  No lymphadenopathy.  Oropharynx  is clear.  Mucous membranes pink and moist.  TMs clear bilaterally.  Pupils equal, reactive to light and accommodation.  Extraocular muscles  are intact.  CARDIOVASCULAR:  Regular rate and rhythm without murmurs, rubs, or  gallops.  PULMONARY:  Lungs are clear to auscultation bilaterally.  ABDOMEN:  Obese, nontender, and nondistended without hepatosplenomegaly.  Bowel sounds  are present.  She has no rebound or guarding.  EXTREMITIES:  She has no clubbing, cyanosis, or edema.  NEURO:  Cranial nerves II through XII are grossly intact.  She has no  focal neurological deficits.   LABORATORY DATA:  White count 9.4, H and H 8.6 and 5.3, MCV 96,  platelets 205.  Sodium 133, potassium 3.8, chloride 102, bicarb 26, BUN  14, creatinine 1.11, baseline creatinine 0.8, and glucose 106.  Urine  shows specific gravity of 1.026, positive for nitrites, and bacteria.   STUDIES:  Chest x-ray shows COPD, but no acute changes.   ASSESSMENT AND PLAN:  1. Fatigue, fever, emotional liability, myalgias, and weakness.  These      are all side effects to her peginterferon and ribavirin, we will      discontinue those.  Currently, we will keep for observation and      recheck her condition in the a.m.  2. Hypertension, currently stable.  The patient is slightly       hypotensive.  Upon repeat checks, we will hold her antihypertensive      medications.  3. Chronic obstructive pulmonary disease.  We will place the patient      on oxygen, check an ABG, and consider evaluation for home O2.  We      have also encouraged the patient not to smoke.  4. Hepatitis C.  We will discontinue ribavirin and peg as above.  The      patient does not show signs of active hepatitis C infection at this      time next.  5. Gastroesophageal reflux disease.  Continue proton pump inhibitor.  6. Dehydration.  We will give her intravenous fluids.  7. Anemia.  The patient may be suffering from aplastic anemia      secondary to her medications or she may have a iron-deficiency      anemia from on other cause.  We will check iron and ferritin.  Her      previous B12 and folate levels were fine.  We will hold EPO at this      time.  8. Urinary tract infection.  We will place the patient on Avelox,      given her intolerance to PENICILLINS.  9. Disposition.  We will keep the patient for observation likely      discharge in a.m.  H and P was constructed by reviewing past      medical history conferring with emergency medical room physician,      reviewing the emergency medical record.  Time spent 1 hour.      Renee Ramus, MD  Electronically Signed     JF/MEDQ  D:  04/13/2009  T:  04/14/2009  Job:  [id number]

## 2011-01-07 NOTE — Group Therapy Note (Signed)
NAME:  Megan Odom, Megan Odom            ACCOUNT NO.:  [id number]   MEDICAL RECORD NO.:  [id number]          PATIENT TYPE:  INP   LOCATION:  A339                          FACILITY:  [facility]   PHYSICIAN:  Margaretmary Dys, M.D.DATE OF BIRTH:  08-30-1945   DATE OF PROCEDURE:  05/28/2006  DATE OF DISCHARGE:                                   PROGRESS NOTE   SUBJECTIVE:  The patient continues to complain of pain, generalized body  aches and pain, especially in the axilla.  She has been requesting for pain  medication but does not think it is helping her that much.  She denies any  fevers but states she has had some chills overnight.  She still feels  generally weak and tired.   OBJECTIVE:  Conscious, alert, comfortable anxious-looking lady.  VITAL SIGNS:  Blood pressure was 122/59, pulse of 69, respiration of 20.  Temperature 99.4, oxygen saturation was 92% on room air.  HEENT:  Normocephalic, atraumatic.  Oral mucosa was moist with no exudate.  NECK:  Supple with no JVD.  LUNGS:  Clear equally.  Good air entry bilaterally.  HEART:  S1, S2 regular.  No S3 gallops or rubs.  ABDOMEN:  Soft, nontender.  Bowel sounds positive.  EXTREMITIES:  No edema.   LABORATORY/DIAGNOSTIC STUDIES:  White blood cell count was 3.3, hemoglobin  of 10.1, hematocrit 29.8, platelet count was 253,000 with no left shift.  Sodium was 130, potassium 4.1, chloride 101, co2 25, glucose of 85, BUN of  18, creatinine 0.9, calcium was 7.7.   ASSESSMENT:  1. Methicillin-resistant Staphylococcus aureus cellulitis with abscess of      the right axilla.  The patient is much better.  Will continue dressing      chronically on Doxycycline.  Bactrim was discontinued due to ulcers      that formed in her mouth.  She does not have any skin rash to suggest      Stevens-Johnson syndrome.  2. Pneumonia.  The patient currently remains afebrile and will remain on      Doxycycline.  The patient was previously on Levaquin.  This was  discontinued.   DISPOSITION:  Overall the patient is improving.  She continues to complain  of generalized weakness and pain, and does have some degree of anxiety.  Will continue on current treatment and I suspect she might be able to go  home in the next 1-2 days.      Margaretmary Dys, M.D.  Electronically Signed    AM/MEDQ  D:  05/28/2006  T:  05/28/2006  Job:  [id number]

## 2011-01-07 NOTE — H&P (Signed)
NAME:  Megan Odom, Megan Odom            ACCOUNT NO.:  [id number]   MEDICAL RECORD NO.:  [id number]          PATIENT TYPE:  INP   LOCATION:  A322                          FACILITY:  [facility]   PHYSICIAN:  Lonia Blood, M.D.      DATE OF BIRTH:  06-19-1946   DATE OF ADMISSION:  05/31/2006  DATE OF DISCHARGE:  LH                                HISTORY & PHYSICAL   PRIMARY CARE PHYSICIAN:  Is unassigned.   PRESENTING COMPLAINT:  Worsening infection and not feeling well.   HISTORY OF PRESENT ILLNESS:  The patient is a 65 year old female that was  just discharged yesterday from the hospital after admission with left chest  wall abscess that grew methicillin-resistant Staphylococcus aureus.  The  patient was on vancomycin, continued to spike fever.  She was also on  Bactrim and was having fever.  She was presumed to have antibiotic  associated fever as well.  She had an incision and drainage prior to being  discharged.  She went home yesterday and apparently was feeling bad.  She  continued to have fever, feels weak and decided to come to the emergency  room.  She was seen in the emergency room and subsequently is being  readmitted.   For past medical history, social history, medication, review of systems,  etc., please see my recent H&P as well as discharge summary by Osvaldo Shipper, MD yesterday.   PHYSICAL EXAMINATION:  VITAL SIGNS:  Temperature is 99.1, blood pressure  128/74, pulse 72 and respiratory rate 22, oxygen saturations 96% room air.  GENERAL:  She is awake, alert, oriented, in no acute distress.  HEENT:  Pupils equal, round and reactive to light and accommodation.  Extraocular movements are intact.  NECK:  Supple, no jugular venous distention noted.  LUNGS:  She has good air entry bilaterally, no wheezes or rales.  CARDIOVASCULAR:  S1, S2, no murmurs.  ABDOMEN:  Obese, nontender, positive bowel sounds.  EXTREMITIES:  No cyanosis, clubbing or edema.   Left chest wall and axilla  showed draining abscess.  Warm and red.  Not  clear if there is any change since late yesterday.   LABORATORY DATA:  White count 2200 with a left shift.  ANC 15.1.  Hemoglobin  is 9.7, platelet count 324,000.  Sodium 132, potassium 3.6, chloride 103,  CO2 23, glucose 108, BUN 20, creatinine 1.1, calcium 7.8.  Total protein  8.0, albumin is 2.3, AST 36, ALT 34, alkaline phosphatase 76, total  bilirubin 1.4.   ASSESSMENT:  A 65 year old female being readmitted with muscle abscess who  apparently went home and was not feeling well.  Her infection was also not  getting any better.   PLAN:  1. Methicillin-resistant Staphylococcus aureus abscess.  Will readmit the      patient.  Restart her vancomycin IV.      Try to transition her to something like doxycycline.  The patient is      apparently weak, so will hydrate her.  2. Pain control.  Will continue with her home medications at this point.      Since she  is being readmitted, we will resume all her previous orders      as they were yesterday prior to discharge.      Lonia Blood, M.D.  Electronically Signed     LG/MEDQ  D:  05/31/2006  T:  06/01/2006  Job:  [id number]

## 2011-01-07 NOTE — Discharge Summary (Signed)
NAME:  Megan Odom, Megan Odom NO.:  [id number]   MEDICAL RECORD NO.:  [id number]          PATIENT TYPE:  INP   LOCATION:  A339                          FACILITY:  [facility]   PHYSICIAN:  Megan Shipper, MD     DATE OF BIRTH:  24-Jan-1946   DATE OF ADMISSION:  05/31/2006  DATE OF DISCHARGE:  10/16/2007LH                                 DISCHARGE SUMMARY   PRIMARY CARE PHYSICIAN:  The patient does not have a PMD, she is requesting  Dr. Juanetta Odom.  An appointment was attempted to be made for this patient.   GENERAL SURGEON:  Megan Odom, M.D.   GASTROENTEROLOGIST:  Megan Odom, M.D.   DISCHARGE DIAGNOSIS:  1. Right axillary cellulitis and abscess status post incision and      drainage, Methicillin resistant Staphylococcus aureus infection stable.  2. Anemia of unclear etiology, stable.  3. Status post blood transfusion.  4. Hypokalemia requiring treatment.  5. Elevated liver function tests secondary to hepatitis C requiring follow      up.  6. Depression, stable.  7. Hypertension, stable.   Please review H&P dictated by Dr. Mikeal Odom regarding the patient's presenting  illness.  Please also review the discharge summary dictated by myself on  October 9 for details regarding the patient's previous hospitalization.   BRIEF HOSPITAL COURSE:  Briefly, this is a 65 year old African American  female who was discharged on October 9 after having a prolonged course for  right axillary cellulitis and abscess requiring I&D by Dr. Malvin Odom.  She  grew MRSA at that site, however, it was community acquired.  She pretty much  was complaining of profound weakness and malaise during the entire  hospitalization the previous time.  She also had a fever which was not  subsiding for which she underwent a complete evaluation.  The patient was  finally discharged even though she was running low grade temperatures.  She  even had an echo which did not show infectious endocarditis.   The  patient returned the following day because of recurrence of her fever.  She was admitted to the hospital and started on vancomycin.  She was also  given levofloxacin.  Slowly, however, the patient's fever subsided.  No  other etiology was found.  It was felt that she might have had a viral  syndrome which would account for her temperatures.  The patient started  feeling well.  Her p.o. intake improved and she was also able to ambulate,  well.  However, her hemoglobin was noted to be 7.8 on October 12.  She did  not have any overt bleeding, however, was complaining of some upper  abdominal discomfort.  Her occult blood testing, however, was negative.  Because of her anemia, she underwent an EGD and colonoscopy.  Her  colonoscopy was poor prep, hence, she will need a barium enema at some  point.  EGD showed some esophagitis, hence, PPI was recommended.  No other  lesions were found.  The patient was also transfused some blood over the  weekend.  Her H&H remained stable.   Hypokalemia was detected which was corrected with  the aid of potassium.  She  is on Lasix which is the reason for her hypokalemia.   She was also found to have elevated LFTs during the previous admission which  was thought to be related to medications as they were subsiding quickly.  However, at this time, also her LFTs were high which prompted acute  hepatitis profile which came back positive for hepatitis C.  Hence, she will  need follow up with gastroenterologist for further management of this issue.   On the day of discharge, the patient was feeling quite well.  She wanted to  go home, hence, we did not see any reason why not.   She was asked to continue all of the medications that were prescribed during  the previous discharge which included Doxycycline, Combivent, Norvasc,  Lasix, Neurontin, Nexium, Paxil, and potassium chloride.  She was also  prescribed Magic mouthwash during admission.   Follow up with Dr.  Juanetta Odom, with Dr. Malvin Odom.  Diet is heart healthy diet.  Physical activity with no restrictions.  She will need outpatient wound care  for her right axillary wound.   Consultation during this admission obtained from gastroenterologist and she  underwent an EGD and a colonoscopy.  Please review the dictated report on  these.      Megan Shipper, MD  Electronically Signed     GK/MEDQ  D:  06/06/2006  T:  06/06/2006  Job:  [id number]   cc:   Megan Odom, M.D.  Fax: [phone]   Megan Odom, M.D.  [address]  [place]  [place] [address]   Megan Odom, M.D.  Fax: [phone]

## 2011-01-07 NOTE — Discharge Summary (Signed)
NAME:  ABBAGAYLE, Megan Odom NO.:  [id number]   MEDICAL RECORD NO.:  [id number]          PATIENT TYPE:  INP   LOCATION:  A339                          FACILITY:  [facility]   PHYSICIAN:  Osvaldo Shipper, MD     DATE OF BIRTH:  1946/04/08   DATE OF ADMISSION:  05/15/2006  DATE OF DISCHARGE:  10/09/2007LH                                 DISCHARGE SUMMARY   Please review H&P dictated by Dr. Mikeal Hawthorne for details regarding patient's  present illness.   DISCHARGE DIAGNOSES:  1. Methicillin resistant Staphylococcus aureus cellulitis with abscess      formation in the right axilla, status post incision and drainage,      improved.  2. Right lower lobe pneumonia, improved.  3. Persistent fever of unclear etiology.  4. Hepatitis likely secondary to medications, improved.  5. Anemia, stable.  6. Hypertension, stable.  7. Possible obesity hypoventilation syndrome versus obstructive sleep      apnea requiring further evaluation.   BRIEF HOSPITAL COURSE:  PROBLEM #1 -  RIGHT AXILLARY ABSCESS:  This is a 65-  year-old [place] American female who presented complaining of pain in the  right chest wall region.  She underwent evaluation in the emergency  department which suggested cellulitis of the right axillary/right breast.  Because of the concern for MRSA, patient was started on vancomycin and  Levaquin.  Patient underwent a CAT scan of her chest which showed prominent  axillary lymph nodes thought to be likely reactive.  No abscess was noted on  the CAT scan.  Patient was continued on antibiotics, however, she continued  to spike temperatures with no improvement in symptoms.  Hence, Dr. Malvin Johns,  general surgeon, was consulted to evaluate this patient who felt that the  patient had some fluctuation.  Hence, he took her to the OR for I&D and  indeed abscess was drained.  The abscess was sent for culture sensitivity  which grew community acquired MRSA.  Patient was continued for about  7-8  days on vancomycin and then was switched over the doxycycline and Bactrim.  Patient developed some oral ulcerations to the Bactrim.  As a result, this  was discontinued.  Patient was discontinued on doxycycline as she had shown  considerable improvement in her wound healing.  Patient will follow with Dr.  Malvin Johns in about a week's time.  She will also need outpatient wound care  which will be set up for her.   PROBLEM #2 -  FEVER:  Patient continued to have fever despite being on  antibiotics.  Chest x-ray was subsequently done which showed a right lower  lobe infiltrate.  Patient was on Levaquin.  It was possible that the patient  also aspirated.  Patient was continued to monitor in the hospital.  The  subsequent x-ray showed clearing up of the air space disease.  Despite this,  the patient continued to have fever.  We obtained Doppler study of the lower  extremities that were negative for DVT.  We obtained multiple blood cultures  on this individual which all came back negative.  We finally gave  consideration  to the thought that antibiotics may somehow be responsible for  her fever, hence her vancomycin was also discontinued as she was switched  over to just p.o. medications.  Patient started showing some improvement.  However, when she was started on Bactrim, she again started spiking  temperatures and developed oral ulcers.  Hence, Bactrim was thought to be  the cause.  She did not develop any evidence of Stevens-Johnson Syndrome.  She did have elevation in her liver enzymes which also started trending  downwards.  Today her temperature was 100.6 and 100.7.  Patient has been  feeling well otherwise.  Hence, we felt that patient was safe enough to go  home.  She was told that if she develops high temperatures again, which was  not relieved with Tylenol, she needed to seek attention immediately.   PROBLEM #3 -  HEADACHE:  Patient started complaining of headache about 2-3  days  ago.  This prompted a CAT scan of the head which was unremarkable.  There were no signs or symptoms suggestive of meningitis in this individual.   PROBLEM #4 -  HEPATITIS:  As mentioned above, Bactrim was likely responsible  for acute elevation in her liver enzymes which also started trending  downwards.   PROBLEM #5 -  ANEMIA:  Remained stable in this individual. She did not  require transfusions.  Her ferritin was not measured.  Her B12 and folate  were okay.   Patient also was complaining of back pain and lower extremity pain. This  prompted initially x-ray of the lumbosacral spine which did not reveal any  acute findings, did suggest some disc degenerative changes.  Since she was  having some radicular pain, we obtained an MRI of the lumbar spine which  showed multilevel degenerative disc and facet disease changes in the L4-5  region with some bilateral neural foraminal narrowing in the L4-5 region  with question compression of the L4 root.  Small L5 central disc protrusion  was also appreciated.  Patient was, however, able to ambulate.  She did not  have any neurological manifestations.  She was told that she would need to  exercise regularly.  She will need to lose some weight and she will also  benefit from physical therapy.  PT did see the patient, however, patient was  not very motivated.  She was also told that if her pain persists or gets  worse or if she experiences weakness in her lower extremities, she would  benefit from a neurosurgical consultation.  She was also complaining of hip  pain but x-ray did not suggest any abnormalities in the hip.   At times, patient would complain of soreness and aches all over the body.  She would appear to be very tearful, hence the question regarding her mood.  It appeared that the patient was depressed without any suicidal or homicidal thoughts.  She said she was having a lot of stress because of her work,  because of her home  environment and that her fiance was very sick.  Patient  was started on Paxil for these symptoms and she showed improvement in her  mood after about a week or so.   Other studies do not include a mammogram and ultrasound of the breast which  did not reveal any evidence for malignancy.  She also underwent a 2-D  echocardiogram because of her continuous fevers did not suggest any  endocarditis and it showed normal left ventricular size with mild concentric  hypertrophy  and normal regional and global function was noted.   DISCHARGE MEDICATIONS:  1. Doxycycline 100 mg b.i.d. for 10 days.  2. Combivent 2 puffs inhaled q.6h.  3. Norvasc 5 mg p.o. daily.  4. Lasix 40 mg p.o. daily.  5. Neurontin 300 mg daily.  6. Nexium 40 mg daily.  7. Paxil 20 mg daily.  8. Potassium chloride 10 mEq daily.   She was to otherwise continue her other outpatient medications as before.  She was asked to discontinue Celebrex for now.   FOLLOW UP:  1. Dr. Malvin Johns in one week.  2. Outpatient wound care as instructed by Dr. Malvin Johns.  3. Dr. Juanetta Gosling in 2-3 weeks.  Patient would like to see him as her primary      doctor.   DIET:  Heart-healthy diet.   ACTIVITY:  No restrictions are placed.   OTHER STUDIES ORDERED:  Sleep study with Dr. Ronal Fear office.  I do not  remember the exact date this has been scheduled for.   Sheets note patient at times would be mildly hypoxic.  She would appear to  be snoring at times.  Patient is a morbidly obese individual, hence  possibilities for OHS versus OSA are present.  Sleep study should help [place]  evaluate this further.   CONSULTATION:  Dr. Malvin Johns.   PROCEDURE:  I&D of her axillary abscess which grew MRSA.   Patient was also prescribed mupirocin ointment to bilateral nostrils for  five days for MRSA.   Discharge time was about 45 minutes.      Osvaldo Shipper, MD  Electronically Signed     GK/MEDQ  D:  05/30/2006  T:  05/30/2006  Job:  [id number]   cc:    Ramon Dredge L. Juanetta Gosling, M.D.  Fax: [phone]

## 2011-01-07 NOTE — Op Note (Signed)
NAME:  Megan Odom, Megan Odom            ACCOUNT NO.:  [id number]   MEDICAL RECORD NO.:  [id number]          PATIENT TYPE:  INP   LOCATION:  A339                          FACILITY:  [facility]   PHYSICIAN:  Lionel December, M.D.    DATE OF BIRTH:  January 25, 1946   DATE OF PROCEDURE:  06/05/2006  DATE OF DISCHARGE:  06/06/2006                                 OPERATIVE REPORT   PROCEDURE:  Esophagogastroduodenoscopy followed by colonoscopy.   INDICATIONS:  Hilda Lias is a 65 year old African American female with multiple  medical problems who has epigastric pain and intermittent hematochezia.  She  also has anemia and has required 2 units of PRBCs.  Her anemia is felt to be  multifactorial and not solely on the basis of her hematochezia.  She could  have peptic ulcer disease as she has been using NSAIDs on an outpatient  basis.  The procedure risks were reviewed with the patient and informed  consent was obtained.   MEDS FOR CONSCIOUS SEDATION:  Benzocaine spray for pharyngeal topical  anesthesia, Demerol 50 mg IV, Versed 9 mg IV.   FINDINGS:  The procedure was performed in the endoscopy suite.  The  patient's vital signs and O2 sat were monitored during the procedure and  remained stable.   ESOPHAGOGASTRODUODENOSCOPY:  The patient was placed in the left lateral  position.  The Olympus videoscope was passed via oropharynx without any  difficulty into esophagus.   Esophagus:  The mucosa of the esophagus was normal except there was erythema  and friability at the GE junction and a non-critical ring.  The GE junction  was at 35 cm and hiatus was at 38.  No esophageal varices were noted.  Please note that the patient has HCV positive HC antibody and ultrasound  shows a fatty liver.   Stomach:  The stomach was empty and distended very well insufflation.  The  folds of the proximal stomach were normal.  Examination of the mucosa at  body, antrum, pyloric channel as well as angularis, fundus and cardia was  normal.   Duodenum:  The bulbar mucosa was normal.  The scope was passed to the second  part of the duodenum where mucosa and folds were normal.  The endoscope was  withdrawn and the patient prepared for procedure #2.   COLONOSCOPY:  Rectal examination was performed.  No abnormality noted on  external or digital exam.  The Olympus videoscope was placed in the rectum  and advanced under vision into the sigmoid colon and beyond.  The  preparation was fair.  She had lot of thick liquid stool and pieces of  formed stool.  With a lot of washing and suctioning, I was able to advance  the scope into the hepatic flexure and ascending colon.  However, cecal  landmarks were not seen.  As the scope was withdrawn, the colonic mucosa was  examined for the second time and no abnormalities noted.  The rectal mucosa  was normal.  The scope was retroflexed to examine anorectal junction and  small hemorrhoids were noted below the dentate line.  The endoscope was  straightened  and withdrawn.  The patient tolerated the procedure well.   FINAL DIAGNOSIS:  1. Small sliding hiatal hernia with mild changes of reflux esophagitis      limited to the GE junction and noncritical ring which was not      manipulated.  2. Small sliding hiatal hernia.  3. Normal exam of the stomach, first and second part of the duodenum.  4. Incomplete colonoscopy to hepatic flexure.  Exam was suboptimal because      of prep.   RECOMMENDATIONS:  1. She will have BE at a later date.  2. Continue anti-reflux measures and PPI.  3. She will have HCV, RNA by PCR quantitative and a genotype.      Lionel December, M.D.  Electronically Signed     NR/MEDQ  D:  06/05/2006  T:  06/06/2006  Job:  [id number]   cc:   Hanley Hays. Josefine Class, M.D.  Fax: [phone]

## 2011-01-07 NOTE — Group Therapy Note (Signed)
NAME:  Megan Odom, Megan Odom            ACCOUNT NO.:  [id number]   MEDICAL RECORD NO.:  [id number]          PATIENT TYPE:  INP   LOCATION:  A339                          FACILITY:  [facility]   PHYSICIAN:  Margaretmary Dys, M.D.DATE OF BIRTH:  Oct 17, 1945   DATE OF PROCEDURE:  DATE OF DISCHARGE:                                   PROGRESS NOTE   SUBJECTIVE:  Patient feels slightly better. She developed some sores in the  mouth yesterday after starting on Bactrim, says the sores are about the  same, Pain in the right axilla is better. She denies any fevers or chills.  No abdominal pain or chest pain.   OBJECTIVE:  GENERAL: Patient is alert, comfortable in no acute distress.  VITAL SIGNS: Blood pressure 122/73, pulse 72, respiration 24, temperature  98.5.  HEENT: Normocephalic atraumatic, patient has multiple ulcers on the tongue  and buccal mucosa.  There was no evidence of thrush noted.  NECK: Supple, no JVD.  LUNGS: Clear clinically with good air entry bilaterally.  HEART: S1 and S3 regular, no S3, gallops or rubs.  ABDOMEN: Obese but soft.  EXTREMITIES: No edema.   Axilla shows abscess healing fairly well.   LABORATORY DATA:  White blood cell count was 3.3, hemoglobin 10.2,  hematocrit 30.2, platelet count 260 with no left shift, sodium 131,  potassium 4.3, chloride 102, C02 24, glucose 91, BUN 18, creatinine 1.0,  AST 119,  ALT of 63. Total protein 6.9, albumin 2.3, calcium 7.9, urinalysis  was negative.   ASSESSMENT & PLAN:  Mr. Lenhard has methicillin-resistant Staphylococcus  aureus (MRSA) abscess of her right axilla, the patient has done very well  and is responding to antibiotics.  She is status post incision and drainage.  She is currently only on Doxycycline as the Bactrim was discontinued  yesterday due to developing mouth sores. There was concern for possible  Levonne Spiller Syndrome. I would observe patient for one to two more days  .  We anticipate discharge in the  next couple of days.      Margaretmary Dys, M.D.  Electronically Signed     AM/MEDQ  D:  05/27/2006  T:  05/28/2006  Job:  [id number]

## 2011-01-07 NOTE — Op Note (Signed)
NAME:  Megan Odom, DEPOLO NO.:  [id number]   MEDICAL RECORD NO.:  [id number]          PATIENT TYPE:  INP   LOCATION:  A339                          FACILITY:  [facility]   PHYSICIAN:  Barbaraann Barthel, M.D. DATE OF BIRTH:  [birth year]   DATE OF PROCEDURE:  05/18/2006  DATE OF DISCHARGE:                                 OPERATIVE REPORT   SURGICAL DIAGNOSIS:  Right axillary abscess.   NOTE:  This is a 65 year old black female, obese nondiabetic, who presented  to the medical service with cellulitis and an abscess pm the right axilla.  Surgery was consulted, and after antibiotics she was taken to surgery.  We  discussed complications, not limited to but including: bleeding, infection,  and the possibility more surgery may be needed.  Informed consent was  obtained.   PROCEDURE:  Incision and drainage, with debridement of right axillary  abscess.   SPECIMENS:  Cultures obtained for aerobic and anaerobic growth.   TECHNIQUE:  The patient was placed slightly on her left side and her right  axilla was prepped with Betadine solution and draped in the usual manner,  after endotracheal intubation.  An incision was carried out over the  fluctuant mass; obvious pus was removed and then necrotic tissue was  debrided.  Cultures were obtained.  We irrigated copiously with normal  saline solution, controlled the bleeding with a cautery device and packed  the wound open with saline-soaked 2-inch roll gauze.  A sterile dressing was  applied.  Prior to closure, all sponge, needle and instrument counts were  found be correct.  Estimated blood loss was minimal.  The patient received  approximately a liter of crystalloids intraoperatively.  There were no  complications.   DISPOSITION/FOLLOW-UP:  This patient will be returned to the medical service  and I will follow along with the physical therapy department (who had been  consulted for wound care).  This has been explained to  them.      Barbaraann Barthel, M.D.  Electronically Signed     WB/MEDQ  D:  05/18/2006  T:  05/20/2006  Job:  [id number]   cc:   Physical Therapy Department   Hospitalist

## 2011-01-07 NOTE — Group Therapy Note (Signed)
NAME:  Megan Odom, Megan Odom            ACCOUNT NO.:  [id number]   MEDICAL RECORD NO.:  [id number]          PATIENT TYPE:  INP   LOCATION:  A339                          FACILITY:  [facility]   PHYSICIAN:  Margaretmary Dys, M.D.DATE OF BIRTH:  [birth year]   DATE OF PROCEDURE:  05/21/2006  DATE OF DISCHARGE:                                   PROGRESS NOTE   SUBJECTIVE:  The patient is status post I&D for a right axillary abscess.  Was informed this morning that this patient is growing MRSA from the wound  cultures.  She denies any headaches.  No chest pain.  No fevers or chills.  She appears anxious.   OBJECTIVE:  GENERAL:  Anxious, but comfortable, no sign of acute distress.  VITAL SIGNS:  Blood pressure 173/54, pulse 76, respirations 26, T-max 98.3,  oxygen saturation 92% on 2 Liters.  HEENT:  Normocephalic, atraumatic.  Oral mucosa was moist with no exudates.  NECK:  Supple, no JVD, no lymphadenopathy.  LUNGS:  Reduced air entry bilaterally with some crackles at the right base.  HEART:  S1 and S2 regular.  ABDOMEN:  Soft, nontender.  Bowel sounds are positive.  AXILLA:  Wound appears clean and is packed.   LABORATORY DATA:  White blood cell count is improved to 7.8, hemoglobin  10.2, hematocrit 30.1, platelet count was 261, neutrophils of 79%, sodium  134, potassium 4.7, chloride 100, CO2 29, glucose 112, BUN 4, creatinine  0.8, calcium 8.  Wound culture is growing moderate Methicillin resistant Staphylococcus  aureus.   ASSESSMENT AND PLAN:  1. Methicillin resistant Staphylococcus aureus abscess of the right axilla      status post incision and drainage.  The patient does not appear septic,      has no fever.  Will continue her Vancomycin at this time.  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  Will continue treatment for the      infection at this time.  2. Hypertension.  The patient is stable.   Continue current medications.  3. Pneumonia with some element of chronic obstructive pulmonary disease.      Oxygen saturation is improved.  Will continue on Levaquin for now.  She      also likely has some component of obesity hypoventilation syndrome.   DISPOSITION:  The patient has a Methicillin resistant Staphylococcus aureus  abscess of her right axilla status post incision and drainage.  Will  continue on Vancomycin, continue Levaquin for pneumonia.      Margaretmary Dys, M.D.  Electronically Signed     AM/MEDQ  D:  05/21/2006  T:  05/21/2006  Job:  [id number]

## 2011-01-07 NOTE — Procedures (Signed)
NAME:  Megan Odom, Megan Odom NO.:  [id number]   MEDICAL RECORD NO.:  [id number]          PATIENT TYPE:  INP   LOCATION:  A339                          FACILITY:  [facility]   PHYSICIAN:  Gerrit Friends. Dietrich Pates, MD, FACCDATE OF BIRTH:  1946-03-17   DATE OF PROCEDURE:  05/23/2006  DATE OF DISCHARGE:                                  ECHOCARDIOGRAM   CLINICAL DATA:  This is a 65 year old gentleman with fever and sepsis.   FINDINGS:  Aorta is 3.1, left atrium is 4, septum is 1.4, posterior wall is  1.3, LV diastole is 4.5 and LV systole is 2.8.   IMPRESSION:  1. Technically adequate echocardiographic study.  2. Very mild left atrial enlargement; normal right atrium.  3. Normal right ventricular size and function; borderline right      ventricular hypertrophy.  4. Minimal aortic valvular sclerosis; mild calcification of the proximal      ascending aortic wall.  5. Normal mitral valve; mild annular calcification.  6. Normal tricuspid valve; physiologic regurgitation.  7. Normal left ventricular size; mild concentric hypertrophy; normal      regional and global function.      Gerrit Friends. Dietrich Pates, MD, [facility]  Electronically Signed     RMR/MEDQ  D:  05/23/2006  T:  05/24/2006  Job:  [id number]   cc:   Osvaldo Shipper, MD

## 2011-01-07 NOTE — Procedures (Signed)
   NAME:  JESSIA, KIEF                      ACCOUNT NO.:  [id number]   MEDICAL RECORD NO.:  [id number]                   PATIENT TYPE:  EMS   LOCATION:  ED                                   FACILITY:  [facility]   PHYSICIAN:  Fredirick Maudlin, M.D.              DATE OF BIRTH:  [birth year]   DATE OF PROCEDURE:  03/28/2002  DATE OF DISCHARGE:                                EKG INTERPRETATION   TIME:  1617 hours on March 28, 2002.  The rhythm is sinus rhythm with a rate  of about 70.  There is left atrial enlargement.  There are slow R waves  rushing across the precordium which may indicate a previous anterior  myocardial infarction. A clinical correlation is suggested.  There is also  ST elevation in V1, V2, V3, and V4 which could indicate some acute change,  and again clinical correlation is suggested.   IMPRESSION:  Abnormal electrocardiogram.                                               Fredirick Maudlin, M.D.    ELH/MEDQ  D:  03/28/2002  T:  03/31/2002  Job:  [id number]

## 2011-01-07 NOTE — Consult Note (Signed)
NAME:  Megan Odom, Megan Odom            ACCOUNT NO.:  [id number]   MEDICAL RECORD NO.:  [id number]          PATIENT TYPE:  INP   LOCATION:  A228                          FACILITY:  [facility]   PHYSICIAN:  Lionel December, M.D.    DATE OF BIRTH:  September 01, 1945   DATE OF CONSULTATION:  06/02/2006  DATE OF DISCHARGE:                                   CONSULTATION   REASON FOR CONSULTATION:  Anemia, elevated LFTs.   PHYSICIAN REQUESTING CONSULTATION:  [facility].   HISTORY OF PRESENT ILLNESS:  The patient is a 65 year old African-American  female who was hospitalized between September 24 through May 30, 2006,  with right axillary cellulitis secondary to MRSA.  The patient was  discharged on May 30, 2006, only to return the following days with  complaints of not feeling well.  She states she was feeling very weak.  She  has had fever within the last 24 hours.  She also reports postprandial upper  abdominal pain associated with nausea, which is worse.  She had some during  her last hospitalization as well.  She also has chronic GERD which is fairly  well controlled on Nexium 40 mg daily.  Denies any dysphagia, odynophagia.  She complains of diarrhea more recently, especially when she is receiving IV  vancomycin.  It is notable that she has not had any BMs in the last 24  hours.  She complains of small-volume hematochezia.  No melena.   With this readmission she has had a significant drop in her hemoglobin.  She  was generally in the 9-10 range and now today is 7.8.  There has been no  noted large overt GI bleeding.  It is also notable that she had elevated  LFTs during her recent hospitalization, predominantly transaminitis.  At the  time she was noted to have significant reaction to Biaxin with oral ulcers  and was felt that she might be having elevated LFTs related to medication.  On May 31, 2006, her LFTs were normal except albumin of 2.3 and total  bilirubin 1.4.  She also has a  normal B12 and folate level.   MEDICATIONS AT HOME:  1. Doxycycline 100 mg b.i.d.  2. Combivent two puffs inhaler q.6h.  3. Albuterol p.r.n.  4. Norvasc 5 mg daily.  5. Lasix 40 mg daily.  6. Neurontin 300 mg daily.  7. Nexium 40 mg daily.  8. Paxil 20 mg daily.  9. Potassium chloride 10 mEq daily.  10.Diovan HCT 160/12.5 mg daily.  11.Premarin 0.9 mg daily.  12.Allegra 180 mg daily.   ALLERGIES:  1. DARVOCET.  2. PENICILLIN.  3. Recently she developed itching with DILAUDID.   PAST MEDICAL HISTORY:  1. Arthritis.  2. Hypertension.  3. GERD.  4. Seasonal allergies.  5. Tobacco abuse.  6. Asthma.  7. MRSA cellulitis with abscess right axilla requiring I&D recently.  8. Recent right lower lobe pneumonia.  9. Anemia.  10.Hepatitis possibly related to Bactrim.  11.Degenerative disc disease.  12.Status post cholecystectomy and partial hysterectomy.   FAMILY HISTORY:  Negative for chronic GI illnesses, colorectal cancer.  SOCIAL HISTORY:  She lives with her husband.  She smokes a half a pack per  day.  No alcohol use or IV drug use.   REVIEW OF SYSTEMS:  See HPI for GI.  CARDIOPULMONARY:  She denies any chest  pain or shortness of breath.   PHYSICAL EXAMINATION:  VITAL SIGNS:  Temperature 97.9, pulse 66,  respirations 24, blood pressure 122/69.  GENERAL:  Pleasant, morbidly-obese African-American female in no acute  distress.  SKIN:  Warm and dry, no jaundice.  HEENT:  Sclerae nonicteric, oropharyngeal mucosa moist and pink.  No  lymphadenopathy.  CHEST:  Lungs are clear to auscultation.  CARDIAC:  Reveals regular rate and rhythm; no murmurs, rubs, or gallops.  ABDOMEN:  Positive bowel sounds, obese but symmetrical, soft.  Mild to  moderate epigastric tenderness to deep palpation.  No organomegaly or masses  appreciated but limited due to body habitus.  No rebound tenderness or  guarding.  EXTREMITIES:  No edema.   LABORATORY DATA:  White count 10,600; hemoglobin  7.8; hematocrit 23.2; MCV  89; platelets 333,000.  Sodium 128, potassium 3.9, BUN 31, creatinine 2.1,  glucose 86.   IMPRESSION:  1. The patient is a 65 year old lady who recently had a prolonged      hospitalization for methicillin-resistant Staphylococcus aureus      cellulitis with abscess of the right axilla.  She now has acutely      worsening anemia with hemoglobin down to 7.8.  She describes a very      small-volume hematochezia recently but no significant amount of notable      bleeding.  Hemoccults are pending.  She is complaining quite a bit of      postprandial epigastric pain.  She has used significant NSAIDs      recently, had been on ibuprofen for quite a while and has been on      Celebrex since the 1990s per her report.  Regarding her anemia and      abdominal pain, she may benefit from upper endoscopy.  Would recommend      colonoscopy at a later date as she has never had one.  She also was      noted to have elevated LFTs during recent admission, possibly drug      related (Biaxin).  Last LFTs 2 days ago were unremarkable.  Will follow      up pending abdominal ultrasound and hepatitis panel.  2. Complains of diarrhea, no bowel movement in over 24 hours.   RECOMMENDATIONS:  1. Will discuss further with Dr. Karilyn Cota.  Possible EGD.  2. Follow up hemoccult and abdominal ultrasound.  3. Colonoscopy at a later date (as outpatient).  4. Further recommendations to follow.      Tana Coast, P.A.      Lionel December, M.D.  Electronically Signed    LL/MEDQ  D:  06/02/2006  T:  06/03/2006  Job:  [id number]

## 2011-01-07 NOTE — Group Therapy Note (Signed)
NAME:  Megan Odom, Megan Odom            ACCOUNT NO.:  [id number]   MEDICAL RECORD NO.:  [id number]          PATIENT TYPE:  INP   LOCATION:  A339                          FACILITY:  [facility]   PHYSICIAN:  Margaretmary Dys, M.D.DATE OF BIRTH:  1946-03-02   DATE OF PROCEDURE:  05/20/2006  DATE OF DISCHARGE:                                   PROGRESS NOTE   SUBJECTIVE:  Status post I&D for right axillary abscess.  She continues to  have a lot of pain and some fevers.  She denies any headaches.  No chest  pain.   OBJECTIVE:  Conscious, alert, comfortable.  Not in acute distress.  VITAL SIGNS:  Blood pressure was 108/63, pulse 70, respirations 16, T-max  was 99.3 degrees Fahrenheit.  Oxygen saturation was 91% on 2.5 liters.  HEENT EXAM:  Normocephalic, atraumatic.  Oral mucosa was moist.  NECK:  Supple.  No JVD, no lymphadenopathy.  LUNGS:  Clear clinically with good air entry bilaterally.  ABDOMEN:  Soft but soft, nontender, bowel sounds positive.  EXTREMITIES:  No edema.  AXILLA:  The patient is status post I&D.  The site was packed.   LABORATORY DATA:  We do not have results of wound culture, but blood  cultures have been negative thus far.  Wound cultures are also negative thus  far.   ASSESSMENT AND PLAN:  1. Right axilla abscess, status post incision and drainage by Dr.      Malvin Johns.  The patient is doing better although continues to have some      pain in the axilla.  Continue on pain medication and intravenous      antibiotics.  The patient is being treated with intravenous vancomycin      and levofloxacin.  The patient is on vancomycin because of concern for      methicillin-resistant Staphylococcus aureus as her fiance has had      extensive problems with methicillin-resistant Staphylococcus aureus      septic arthritis of his right knee and was actually under the      hospitalists' service over the past 6 months with intermittent      admissions. We will continue dressing as per  general surgery's      recommendation.  2. Hypertension.  The patient is stable.  Blood pressure in satisfactory      range.  Continue current medications.  3. Degenerative joint disease.  Continue Celebrex and p.r.n. analgesia.  4. Asthma/Pneumonia .  The patient is stable.  I think she may have a      component of chronic obstructive pulmonary disease.  I am a little bit      worried about her oxygen level is not that high.  It could be obesity      hypoventilation syndrome.  Will watch this and if the oxygen level      continues to stay low, we may do a blood gas and evaluate her for      chronic obstructive pulmonary disease including pulmonary consult and      pulmonary function tests.   DISPOSITION:  Continue dressing of axillary abscess  and await final results  of cultures.  Anticipate discharge in the next 1-2 days.      Margaretmary Dys, M.D.  Electronically Signed     AM/MEDQ  D:  05/20/2006  T:  05/20/2006  Job:  [id number]

## 2011-01-07 NOTE — H&P (Signed)
NAME:  Megan Odom, Megan Odom            ACCOUNT NO.:  [id number]   MEDICAL RECORD NO.:  [id number]          PATIENT TYPE:  INP   LOCATION:  A339                          FACILITY:  [facility]   PHYSICIAN:  Lonia Blood, M.D.      DATE OF BIRTH:  1946-03-10   DATE OF ADMISSION:  05/15/2006  DATE OF DISCHARGE:  LH                                HISTORY & PHYSICAL   PRIMARY CARE PHYSICIAN:  Health department in [place].   CHIEF COMPLAINT:  Right chest wall pain and swelling.   HISTORY OF PRESENT ILLNESS:  The patient is a 65 year old female with  history of asthma, tobacco abuse and arthritis who is a poor historian.  She  apparently has been having pain and itching on the right side of her chest  wall which has been going on for 2 days now.  She has had some fever with  ache, but no chills.  Her breast has started expanding and becoming red and  painful, hence, she came to the emergency room.  Denied any trauma, no  insect bite.  The patient also denied any cuts.   PAST MEDICAL HISTORY:  1. Arthritis.  2. Hypertension.  3. GERD.  4. Seasonal allergies.  5. Tobacco abuse.  6. Asthma.   ALLERGIES:  DARVOCET-N 100, PENICILLIN and recently she is itching with  DILAUDID in the ER.   MEDICATIONS:  1. Celebrex 200 mg daily.  2. Nexium 40 mg daily.  3. Diovan HCT 160/12.5 one tablet daily.  4. Premarin 0.9 mg daily.  5. Norvasc 5 mg daily.  6. Proventil 2 puffs twice a day.  7. Lasix 40 mg daily.  8. Potassium chloride 10 mEq daily.  9. Allegra 180 mg daily.  10.Neurontin 300 mg daily.  11.Oxycodone 10/325 p.r.n.  12.Albuterol p.r.n.   SOCIAL HISTORY:  The patient lives with her husband here in [place].  She  smokes about 1/2 pack per day.  Denied any alcohol use.  Denied any IV drug  use.   FAMILY HISTORY:  Mainly hypertension.  There is also diabetes in grandmother  and aunt.   REVIEW OF SYSTEMS:  A 12-point review of system is mainly significant for  right leg pain which is  chronic.  Otherwise, per HPI.   PHYSICAL EXAMINATION:  VITAL SIGNS:  Temperature 97.4, blood pressure  129/76, pulse 78, respirations 20, saturations 97% on room air.  GENERAL:  The patient is awake, alert, morbidly obese, poor historian, but  in no acute distress.  HEENT:  PERRLA.  EOMI.  She has injected conjunctivae.  NECK:  Supple, no JVD or lymphadenopathy.  RESPIRATORY:  Good air entry bilaterally.  No wheezes or rales.  CHEST:  Right chest wall area posterolaterally has an indurated, red, tender  area covering about 10 x 12 cm.  It is not fluctuant.  Multiple skin tags in  the area.  No open wounds.  CARDIAC:  S1, S2, no murmurs.  ABDOMEN:  Obese, nontender, positive bowel sounds.  She has a visible scar  in the lower abdomen around suprapubic area.  EXTREMITIES:  No clubbing, cyanosis or  edema.  No visible swelling or  tenderness in joints.   LABORATORY DATA AND X-RAY FINDINGS:  White count 13.5, hemoglobin 12.4,  platelet count 230 with a left shift, ANC 9.6.  Sodium 137, potassium 3.6,  chloride 105, CO2 25, glucose 109, BUN 11, creatinine 0.7, calcium 8.7.  Total protein 8.1, albumin 2.9, AST 28, ALT 32, Alk phos 79, total bilirubin  0.8.   ASSESSMENT:  This is a 65 year old female presenting with right chest wall  cellulitis.  Chances are with a lot of skin tags in the area, the patient  may have been scratching and entered infection through the skin.  There is a  high risk for community-acquired methicillin-resistant Staphylococcus aureus  based on the presentation.   PLAN:  1. Cellulitis.  The patient is not diabetic.  Chances are this is straight      forward community-acquired MRSA.  I will start the patient on      vancomycin.  However, will rule out other possibilities.  She is      penicillin allergic and I will put her on Levaquin as well to cover up      for other polymicrobial infections while getting blood cultures.      Hopefully, the patient will improve  with combination of antibiotics.      If we get any cultures back, we will narrow down her antibiotic.  2. Hypertension.  I will continue with her home medications.  3. Arthritis.  I will continue with her Celebrex and other pain      medications as well.  4. Asthma.  The patient is not having any exacerbation at this point.  I      will put her on albuterol and Atrovent and then continue with her      Proventil.  5. Arthritis.  This seems to be bothering patient acutely.  I will      continue Celebrex, however, will consider imaging her joints once she      is more stable.  6. The rest of her medical problems seem to be stable.  Will check a CT of      her chest wall to rule out any abscess.  Continue with treatment as      dictated above.      Lonia Blood, M.D.  Electronically Signed     LG/MEDQ  D:  05/15/2006  T:  05/15/2006  Job:  [id number]

## 2011-01-07 NOTE — Consult Note (Signed)
NAME:  Megan Odom, BROBERG NO.:  [id number]   MEDICAL RECORD NO.:  [id number]          PATIENT TYPE:  INP   LOCATION:  A339                          FACILITY:  [facility]   PHYSICIAN:  Barbaraann Barthel, M.D. DATE OF BIRTH:  1945/09/12   DATE OF CONSULTATION:  05/17/2006  DATE OF DISCHARGE:                                   CONSULTATION   Surgery was asked to see this 65 year old black female for a right axillary  abscess.  In essence, she is a non-diabetic obese female who has had right  axillary discomfort for approximately 5 days.  She has been admitted to the  hospital for diagnosis, and her diagnosis has been cellulitis, hypertension,  arthritis, and a history of asthma and with continued tobacco use, and  arthritis which has been further evaluated by CT scan.   I have reviewed the chart and discussed the need for an I&D for her in  detail, and we will plan to do this tomorrow.  We will continue antibiotics  today with warm compresses and plan to do this first thing in the morning.      Barbaraann Barthel, M.D.  Electronically Signed     WB/MEDQ  D:  05/17/2006  T:  05/18/2006  Job:  [id number]   cc:   Barbaraann Barthel, M.D.  Fax: [phone]   Hospitalists

## 2011-01-28 ENCOUNTER — Other Ambulatory Visit (HOSPITAL_COMMUNITY): Payer: Self-pay | Admitting: Family Medicine

## 2011-01-28 DIAGNOSIS — Z139 Encounter for screening, unspecified: Secondary | ICD-10-CM

## 2011-02-07 ENCOUNTER — Ambulatory Visit (HOSPITAL_COMMUNITY)
Admission: RE | Admit: 2011-02-07 | Discharge: 2011-02-07 | Disposition: A | Discharge status: Home / Self Care | Payer: Medicaid Other | Source: Ambulatory Visit | Attending: Family Medicine | Admitting: Family Medicine

## 2011-02-07 DIAGNOSIS — Z1231 Encounter for screening mammogram for malignant neoplasm of breast: Secondary | ICD-10-CM | POA: Insufficient documentation

## 2011-02-08 ENCOUNTER — Ambulatory Visit (HOSPITAL_COMMUNITY): Payer: Medicaid Other

## 2011-02-08 ENCOUNTER — Inpatient Hospital Stay (HOSPITAL_COMMUNITY): Admission: RE | Admit: 2011-02-08 | Payer: Medicaid Other | Source: Ambulatory Visit

## 2011-02-26 ENCOUNTER — Emergency Department (HOSPITAL_COMMUNITY): Payer: Medicare PPO

## 2011-02-26 ENCOUNTER — Emergency Department (HOSPITAL_COMMUNITY)
Admission: EM | Admit: 2011-02-26 | Discharge: 2011-02-26 | Disposition: A | Discharge status: Home / Self Care | Payer: Medicare PPO | Attending: Emergency Medicine | Admitting: Emergency Medicine

## 2011-02-26 ENCOUNTER — Other Ambulatory Visit: Payer: Self-pay

## 2011-02-26 ENCOUNTER — Encounter: Payer: Self-pay | Admitting: Emergency Medicine

## 2011-02-26 DIAGNOSIS — R42 Dizziness and giddiness: Secondary | ICD-10-CM | POA: Insufficient documentation

## 2011-02-26 DIAGNOSIS — F17200 Nicotine dependence, unspecified, uncomplicated: Secondary | ICD-10-CM | POA: Insufficient documentation

## 2011-02-26 DIAGNOSIS — R51 Headache: Secondary | ICD-10-CM | POA: Insufficient documentation

## 2011-02-26 DIAGNOSIS — G43909 Migraine, unspecified, not intractable, without status migrainosus: Secondary | ICD-10-CM

## 2011-02-26 DIAGNOSIS — R112 Nausea with vomiting, unspecified: Secondary | ICD-10-CM | POA: Insufficient documentation

## 2011-02-26 DIAGNOSIS — I10 Essential (primary) hypertension: Secondary | ICD-10-CM | POA: Insufficient documentation

## 2011-02-26 DIAGNOSIS — R079 Chest pain, unspecified: Secondary | ICD-10-CM | POA: Insufficient documentation

## 2011-02-26 HISTORY — DX: Essential (primary) hypertension: I10

## 2011-02-26 HISTORY — DX: Chronic viral hepatitis C: B18.2

## 2011-02-26 HISTORY — DX: Unspecified osteoarthritis, unspecified site: M19.90

## 2011-02-26 LAB — CBC
HCT: 42 % (ref 36.0–46.0)
Hemoglobin: 13.9 g/dL (ref 12.0–15.0)
MCH: 30.1 pg (ref 26.0–34.0)
MCHC: 33.1 g/dL (ref 30.0–36.0)
MCV: 90.9 fL (ref 78.0–100.0)
Platelets: 288 10*3/uL (ref 150–400)
RBC: 4.62 MIL/uL (ref 3.87–5.11)
RDW: 12 % (ref 11.5–15.5)
WBC: 11.4 10*3/uL — ABNORMAL HIGH (ref 4.0–10.5)

## 2011-02-26 LAB — BASIC METABOLIC PANEL
BUN: 12 mg/dL (ref 6–23)
CO2: 27 mEq/L (ref 19–32)
Calcium: 9.1 mg/dL (ref 8.4–10.5)
Chloride: 99 mEq/L (ref 96–112)
Creatinine, Ser: 0.95 mg/dL (ref 0.50–1.10)
GFR calc Af Amer: >60 mL/min (ref >60)
GFR calc non Af Amer: 59 mL/min — ABNORMAL LOW (ref >60)
Glucose, Bld: 112 mg/dL — ABNORMAL HIGH (ref 70–99)
Potassium: 3.9 mEq/L (ref 3.5–5.1)
Sodium: 135 mEq/L (ref 135–145)

## 2011-02-26 LAB — TROPONIN I: Troponin I: <0.3 ng/mL (ref <0.30)

## 2011-02-26 LAB — CK TOTAL AND CKMB (NOT AT ARMC)
CK, MB: 2.1 ng/mL (ref 0.3–4.0)
Relative Index: INVALID (ref 0.0–2.5)
Total CK: 92 U/L (ref 7–177)

## 2011-02-26 MED ORDER — SODIUM CHLORIDE 0.9 % IV SOLN
Freq: Once | INTRAVENOUS | Status: AC
  Administered 2011-02-26: 18:00:00 via INTRAVENOUS

## 2011-02-26 MED ORDER — MORPHINE SULFATE 4 MG/ML IJ SOLN
4.0000 mg | Freq: Once | INTRAMUSCULAR | Status: AC
  Administered 2011-02-26: 4 mg via INTRAVENOUS
  Filled 2011-02-26: qty 1

## 2011-02-26 MED ORDER — METOCLOPRAMIDE HCL 5 MG/ML IJ SOLN
10.0000 mg | Freq: Once | INTRAMUSCULAR | Status: AC
  Administered 2011-02-26: 10 mg via INTRAVENOUS
  Filled 2011-02-26: qty 2

## 2011-02-26 MED ORDER — BUTALBITAL-ASA-CAFFEINE 50-325-40 MG PO CAPS: 1.0000 | ORAL_CAPSULE | Freq: Two times a day (BID) | ORAL | Status: AC | PRN

## 2011-02-26 NOTE — ED Notes (Signed)
Pt c/o headache continuously x 2-3 months and dizziness. Pt states that she started having chest pain this afternoon. Alert and oriented x 3. Skin warm and dry. Color pink. Breath sounds clear and equal bilaterally. CCM showing NSR.

## 2011-02-26 NOTE — ED Notes (Signed)
Headache everyday x 2-3 months. Nad. Nondiaphoretic. Does not appear sob

## 2011-02-26 NOTE — ED Notes (Signed)
Family at bedside. 

## 2011-02-26 NOTE — ED Notes (Signed)
Pt back from x-ray. Family at bedside. CCM showing NSR. Denies pain at this time.

## 2011-02-26 NOTE — ED Provider Notes (Signed)
History     Chief Complaint  Patient presents with  . Headache  . Dizziness  . Chest Pain   HPI Comments: Pt also had an episode of brief chest pain today.  No shortness of breath.  Left chest, feels like a quivering. Her main complaint is the headache.  Her doctor told her if it persisted she would get a head CT.  Pt has had episodes of chest pain before.  No history of CAD, PE.  Patient is a 65 y.o. female presenting with headaches and chest pain. The history is provided by the patient.  Headache  This is a chronic problem. Episode onset: several months ago. The problem occurs constantly. The problem has been gradually worsening. The headache is associated with nothing. The pain is located in the bilateral region. The quality of the pain is described as throbbing. The pain is moderate. The pain does not radiate. Associated symptoms include nausea and vomiting. Pertinent negatives include no anorexia, no malaise/fatigue, no near-syncope, no syncope and no shortness of breath. Treatments tried: Pt tried otc meds with some relief but it returns.  Chest Pain Primary symptoms include nausea and vomiting. Pertinent negatives for primary symptoms include no syncope and no shortness of breath.  Pertinent negatives for associated symptoms include no near-syncope.     Past Medical History  Diagnosis Date  . Hypertension   . Asthma   . Arthritis   . Hep C w/o coma, chronic     Past Surgical History  Procedure Date  . Cholecystectomy   . Appendectomy   . Abdominal hysterectomy   . Breast surgery     History reviewed. No pertinent family history.  History  Substance Use Topics  . Smoking status: Current Everyday Smoker  . Smokeless tobacco: Not on file  . Alcohol Use: No    OB History    Grav Para Term Preterm Abortions TAB SAB Ect Mult Living                  Review of Systems  Constitutional: Negative.  Negative for malaise/fatigue.  Respiratory: Negative for shortness of  breath.   Cardiovascular: Positive for chest pain. Negative for syncope and near-syncope.  Gastrointestinal: Positive for nausea and vomiting. Negative for anorexia.  Neurological: Positive for headaches.    Physical Exam  BP 128/80  Pulse 81  Temp(Src) 98.3 F (36.8 C) (Oral)  Resp 20  Ht 5' 5.5" (1.664 m)  Wt 268 lb (121.564 kg)  BMI 43.92 kg/m2  SpO2 99%  Physical Exam  Constitutional: No distress.       obese  HENT:  Head: Normocephalic and atraumatic.  Right Ear: External ear normal.  Left Ear: External ear normal.  Eyes: Conjunctivae are normal. Right eye exhibits no discharge. Left eye exhibits no discharge. No scleral icterus.  Neck: Neck supple. No tracheal deviation present.  Cardiovascular: Normal rate, regular rhythm and intact distal pulses.   Pulmonary/Chest: Effort normal and breath sounds normal. No stridor. No respiratory distress. She has no wheezes. She has no rales. She exhibits tenderness.  Abdominal: Soft. Bowel sounds are normal. She exhibits no distension. There is no tenderness. There is no rebound and no guarding.  Musculoskeletal: She exhibits no edema and no tenderness.  Neurological: She is alert. She has normal strength. She displays no tremor. No cranial nerve deficit ( no gross defecits noted) or sensory deficit. She exhibits normal muscle tone. She displays no seizure activity. Coordination normal. GCS eye subscore is 4.  GCS verbal subscore is 5. GCS motor subscore is 6.       5/5 UE and LE strength, no pronator drift,  No drift le bilaterally  Skin: Skin is warm and dry. No rash noted.  Psychiatric: She has a normal mood and affect.    ED Course  Procedures EKG, rate 74, normal interval, rhythm, axis and st t waves.  No old for comparison MDM All labs and xrays reviewed.  EKG documented in addendum.  Symptoms atypical for cardiac etiology.  Suspect more of a migraine type headache regarding her headache.  Will dc home on po meds.  Follow  upwith PCP      Celene Kras, MD 02/26/11 Ernestina [place]

## 2011-02-26 NOTE — ED Notes (Signed)
Pt to CT. Still c/o chest pain. CCM showing NSR. Pt asking for ice chips. IV infusing with no edema or redness.

## 2011-02-26 NOTE — ED Provider Notes (Signed)
Date: 02/07/2011   Rate: 74  Rhythm: normal sinus rhythm  QRS Axis: normal  Intervals: normal  ST/T Wave abnormalities: normal  Conduction Disutrbances:none  Narrative Interpretation:   Old EKG Reviewed: none available   Celene Kras, MD 02/26/11 [id number]

## 2011-02-26 NOTE — ED Notes (Signed)
AVW:[id number]<BJ> Expected date:02/26/11<BR> Expected time: 3:51 PM<BR> Means of arrival:<BR> Comments:<BR> EMS,

## 2011-05-16 LAB — CULTURE, ROUTINE-ABSCESS: Gram Stain: NONE SEEN

## 2011-05-23 LAB — CULTURE, ROUTINE-ABSCESS: Gram Stain: NONE SEEN

## 2011-06-03 LAB — BASIC METABOLIC PANEL
BUN: 11
CO2: 29
Calcium: 8.8
Chloride: 102
Creatinine, Ser: 0.8
GFR calc Af Amer: >60
GFR calc non Af Amer: >60
Glucose, Bld: 104 — ABNORMAL HIGH
Potassium: 3.3 — ABNORMAL LOW
Sodium: 135

## 2011-06-03 LAB — DIFFERENTIAL
Basophils Absolute: 0
Basophils Relative: 0
Eosinophils Absolute: 0.2
Eosinophils Relative: 2
Lymphocytes Relative: 29
Lymphs Abs: 2.5
Monocytes Absolute: 0.8 — ABNORMAL HIGH
Monocytes Relative: 9
Neutro Abs: 5
Neutrophils Relative %: 59

## 2011-06-03 LAB — POCT CARDIAC MARKERS
CKMB, poc: 1.8
Myoglobin, poc: 86.9
Operator id: 209231
Troponin i, poc: <0.05

## 2011-06-03 LAB — CBC
HCT: 38.7
Hemoglobin: 12.9
MCHC: 33.3
MCV: 89.8
Platelets: 239
RBC: 4.31
RDW: 12.2
WBC: 8.5

## 2011-06-08 LAB — COMPREHENSIVE METABOLIC PANEL
ALT: 27
AST: 27
Albumin: 2.6 — ABNORMAL LOW
Alkaline Phosphatase: 83
BUN: 12
CO2: 29
Calcium: 8.1 — ABNORMAL LOW
Chloride: 108
Creatinine, Ser: 0.96
GFR calc Af Amer: >60
GFR calc non Af Amer: 60 — ABNORMAL LOW
Glucose, Bld: 100 — ABNORMAL HIGH
Potassium: 4
Sodium: 140
Total Bilirubin: 0.6
Total Protein: 6.5

## 2011-06-08 LAB — CBC
HCT: 36.8
Hemoglobin: 12.5
MCHC: 34
MCV: 88.7
Platelets: 293
RBC: 4.15
RDW: 12.1
WBC: 9.8

## 2011-06-08 LAB — DIFFERENTIAL
Basophils Absolute: 0.1
Basophils Relative: 2 — ABNORMAL HIGH
Eosinophils Absolute: 0.4
Eosinophils Relative: 4
Lymphocytes Relative: 39
Lymphs Abs: 3.8 — ABNORMAL HIGH
Monocytes Absolute: 0.3
Monocytes Relative: 3
Neutro Abs: 5.1
Neutrophils Relative %: 52

## 2011-06-08 LAB — URINALYSIS, ROUTINE W REFLEX MICROSCOPIC
Bilirubin Urine: NEGATIVE
Glucose, UA: NEGATIVE
Hgb urine dipstick: NEGATIVE
Ketones, ur: NEGATIVE
Nitrite: NEGATIVE
Protein, ur: NEGATIVE
Specific Gravity, Urine: 1.025
Urobilinogen, UA: 0.2
pH: 6.5

## 2011-07-09 ENCOUNTER — Emergency Department (HOSPITAL_COMMUNITY)
Admission: EM | Admit: 2011-07-09 | Discharge: 2011-07-09 | Disposition: A | Discharge status: Home / Self Care | Payer: Medicare PPO | Attending: Emergency Medicine | Admitting: Emergency Medicine

## 2011-07-09 ENCOUNTER — Emergency Department (HOSPITAL_COMMUNITY): Payer: Medicare PPO

## 2011-07-09 ENCOUNTER — Encounter (HOSPITAL_COMMUNITY): Payer: Self-pay

## 2011-07-09 ENCOUNTER — Other Ambulatory Visit: Payer: Self-pay

## 2011-07-09 DIAGNOSIS — IMO0001 Reserved for inherently not codable concepts without codable children: Secondary | ICD-10-CM | POA: Insufficient documentation

## 2011-07-09 DIAGNOSIS — R062 Wheezing: Secondary | ICD-10-CM | POA: Insufficient documentation

## 2011-07-09 DIAGNOSIS — Z79899 Other long term (current) drug therapy: Secondary | ICD-10-CM | POA: Insufficient documentation

## 2011-07-09 DIAGNOSIS — R0602 Shortness of breath: Secondary | ICD-10-CM | POA: Insufficient documentation

## 2011-07-09 DIAGNOSIS — R6883 Chills (without fever): Secondary | ICD-10-CM | POA: Insufficient documentation

## 2011-07-09 DIAGNOSIS — R51 Headache: Secondary | ICD-10-CM | POA: Insufficient documentation

## 2011-07-09 DIAGNOSIS — J069 Acute upper respiratory infection, unspecified: Secondary | ICD-10-CM | POA: Insufficient documentation

## 2011-07-09 DIAGNOSIS — R0982 Postnasal drip: Secondary | ICD-10-CM | POA: Insufficient documentation

## 2011-07-09 DIAGNOSIS — R070 Pain in throat: Secondary | ICD-10-CM | POA: Insufficient documentation

## 2011-07-09 DIAGNOSIS — F17200 Nicotine dependence, unspecified, uncomplicated: Secondary | ICD-10-CM | POA: Insufficient documentation

## 2011-07-09 DIAGNOSIS — J40 Bronchitis, not specified as acute or chronic: Secondary | ICD-10-CM

## 2011-07-09 MED ORDER — PREDNISONE 20 MG PO TABS
60.0000 mg | ORAL_TABLET | Freq: Once | ORAL | Status: AC
  Administered 2011-07-09: 60 mg via ORAL
  Filled 2011-07-09: qty 3

## 2011-07-09 MED ORDER — ALBUTEROL SULFATE (5 MG/ML) 0.5% IN NEBU
2.5000 mg | INHALATION_SOLUTION | Freq: Once | RESPIRATORY_TRACT | Status: AC
  Administered 2011-07-09: 2.5 mg via RESPIRATORY_TRACT
  Filled 2011-07-09: qty 0.5

## 2011-07-09 MED ORDER — HYDROCOD POLST-CHLORPHEN POLST 10-8 MG/5ML PO LQCR
5.0000 mL | Freq: Once | ORAL | Status: AC
  Administered 2011-07-09: 5 mL via ORAL
  Filled 2011-07-09: qty 5

## 2011-07-09 MED ORDER — HYDROCOD POLST-CHLORPHEN POLST 10-8 MG/5ML PO LQCR: 5.0000 mL | Freq: Two times a day (BID) | ORAL | Status: DC

## 2011-07-09 MED ORDER — PREDNISONE 10 MG PO TABS: ORAL_TABLET | ORAL | Status: DC

## 2011-07-09 MED ORDER — IPRATROPIUM BROMIDE 0.02 % IN SOLN
0.5000 mg | Freq: Once | RESPIRATORY_TRACT | Status: AC
  Administered 2011-07-09: 0.5 mg via RESPIRATORY_TRACT
  Filled 2011-07-09: qty 2.5

## 2011-07-09 MED ORDER — PROMETHAZINE HCL 12.5 MG PO TABS: 12.5000 mg | ORAL_TABLET | Freq: Four times a day (QID) | ORAL | Status: DC | PRN

## 2011-07-09 NOTE — ED Notes (Signed)
Pt c/o cough, congestion, chest pain, and abd pain x 3 days.  Says feels like she has pneumonia.

## 2011-07-09 NOTE — ED Provider Notes (Signed)
History     CSN: [id number] Arrival date & time: 07/09/2011 10:30 AM   None     Chief Complaint  Patient presents with  . URI    (Consider location/radiation/quality/duration/timing/severity/associated sxs/prior treatment) Patient is a 65 y.o. female presenting with cough. The history is provided by the patient.  Cough This is a new problem. The current episode started more than 2 days ago. The problem occurs every few minutes. The problem has been gradually worsening. The cough is non-productive. There has been no fever. Associated symptoms include chills, headaches, sore throat, myalgias, shortness of breath and wheezing. Pertinent negatives include no chest pain. She has tried decongestants and cough syrup for the symptoms. The treatment provided no relief. She is a smoker. Her past medical history is significant for bronchitis, pneumonia and asthma.    Past Medical History  Diagnosis Date  . Hypertension   . Asthma   . Arthritis   . Hep C w/o coma, chronic     Past Surgical History  Procedure Date  . Cholecystectomy   . Appendectomy   . Abdominal hysterectomy   . Breast surgery     No family history on file.  History  Substance Use Topics  . Smoking status: Current Everyday Smoker  . Smokeless tobacco: Not on file  . Alcohol Use: No    OB History    Grav Para Term Preterm Abortions TAB SAB Ect Mult Living                  Review of Systems  Constitutional: Positive for chills. Negative for activity change.       All ROS Neg except as noted in HPI  HENT: Positive for congestion, sore throat and postnasal drip. Negative for nosebleeds and neck pain.   Eyes: Negative for photophobia and discharge.  Respiratory: Positive for cough, shortness of breath and wheezing.   Cardiovascular: Negative for chest pain and palpitations.  Gastrointestinal: Negative for abdominal pain and blood in stool.  Genitourinary: Negative for dysuria, frequency and hematuria.    Musculoskeletal: Positive for myalgias. Negative for back pain and arthralgias.  Skin: Negative.   Neurological: Positive for headaches. Negative for dizziness, seizures and speech difficulty.  Psychiatric/Behavioral: Negative for hallucinations and confusion.    Allergies  Darvocet; Penicillins; and Tetracyclines & related  Home Medications   Current Outpatient Rx  Name Route Sig Dispense Refill  . ALBUTEROL SULFATE HFA 108 (90 BASE) MCG/ACT IN AERS Inhalation Inhale 2 puffs into the lungs every 6 (six) hours as needed. Shortness of breath     . ALPRAZOLAM 0.5 MG PO TABS Oral Take 0.5 mg by mouth at bedtime as needed. Anxiety      . AMITRIPTYLINE HCL 10 MG PO TABS Oral Take 10 mg by mouth at bedtime.      Marland Kitchen AMLODIPINE BESYLATE 5 MG PO TABS Oral Take 5 mg by mouth daily.      . ASPIRIN EC 81 MG PO TBEC Oral Take 81 mg by mouth daily.      . BECLOMETHASONE DIPROPIONATE 40 MCG/ACT IN AERS Inhalation Inhale 2 puffs into the lungs 2 (two) times daily.      Marland Kitchen BENZONATATE 100 MG PO CAPS Oral Take 200 mg by mouth 3 (three) times daily as needed. cough     . OMEGA-3 FATTY ACIDS 1000 MG PO CAPS Oral Take 2 g by mouth daily.      Marland Kitchen FLUTICASONE PROPIONATE 50 MCG/ACT NA SUSP Nasal Place 2 sprays into  the nose daily.      Marland Kitchen FLUTICASONE-SALMETEROL 250-50 MCG/DOSE IN AEPB Inhalation Inhale 1 puff into the lungs every 12 (twelve) hours.      . FUROSEMIDE 40 MG PO TABS Oral Take 40 mg by mouth daily.      . IBUPROFEN 800 MG PO TABS Oral Take 800 mg by mouth every 8 (eight) hours as needed. Pain      . LORATADINE 10 MG PO TABS Oral Take 5 mg by mouth daily.      Marland Kitchen OMEPRAZOLE 40 MG PO CPDR Oral Take 40 mg by mouth 2 (two) times daily.      Marland Kitchen PAROXETINE HCL 20 MG PO TABS Oral Take 20 mg by mouth every morning.      Marland Kitchen POTASSIUM CHLORIDE CRYS CR 20 MEQ PO TBCR Oral Take 20 mEq by mouth 2 (two) times daily.      . TRAMADOL HCL 50 MG PO TABS Oral Take 50 mg by mouth every 6 (six) hours as needed. Pain       . VALSARTAN-HYDROCHLOROTHIAZIDE 160-12.5 MG PO TABS Oral Take 1 tablet by mouth daily.        BP 140/82  Pulse 60  Temp(Src) 98.5 F (36.9 C) (Oral)  Resp 20  Ht 5\' 5"  (1.651 m)  Wt 256 lb (116.121 kg)  BMI 42.60 kg/m2  SpO2 98%  Physical Exam  Nursing note and vitals reviewed. Constitutional: She is oriented to person, place, and time. She appears well-developed and well-nourished.  Non-toxic appearance.  HENT:  Head: Normocephalic.  Right Ear: Tympanic membrane and external ear normal.  Left Ear: Tympanic membrane and external ear normal.       Mild to mod nasal congestion present.  Eyes: EOM and lids are normal. Pupils are equal, round, and reactive to light.  Neck: Normal range of motion. Neck supple. Carotid bruit is not present.  Cardiovascular: Normal rate, regular rhythm, normal heart sounds, intact distal pulses and normal pulses.   Pulmonary/Chest: Tachypnea noted. She has wheezes. She has rhonchi.  Abdominal: Soft. Bowel sounds are normal. There is no tenderness. There is no guarding.  Musculoskeletal: Normal range of motion.  Lymphadenopathy:       Head (right side): No submandibular adenopathy present.       Head (left side): No submandibular adenopathy present.    She has no cervical adenopathy.  Neurological: She is alert and oriented to person, place, and time. She has normal strength. No cranial nerve deficit or sensory deficit.  Skin: Skin is warm and dry.  Psychiatric: She has a normal mood and affect. Her speech is normal.    ED Course  Procedures (including critical care time)  Labs Reviewed - No data to display Dg Chest 2 View  07/09/2011  *RADIOLOGY REPORT*  Clinical Data: Cough.  History of asthma.  CHEST - 2 VIEW  Comparison: PA and lateral chest 04/20/2009 single view of the chest 02/26/2011.  Findings: The lungs are clear.  Heart size is normal.  No pneumothorax or pleural effusion.  IMPRESSION: No acute disease.  Original Report Authenticated  By: Bernadene Bell. Maricela Curet, M.D.     Dx: Bronchitis  2. URI   MDM  I have reviewed nursing notes, vital signs, and all appropriate lab and imaging results for this patient. After albuterol, prednisone and cough meds, pt breathing easier. Pulse ox 95 to 97% on room air. Pt advised to use albuterol every 4 hours. Prednisone daily. And Tussionex for cough. She is to  return if any changes or problem.        Kathie Dike, [place] 07/10/11 1023

## 2011-07-09 NOTE — ED Notes (Signed)
Pt requested ice cream. Pt advocate provided ice cream for pt.

## 2011-07-10 NOTE — ED Provider Notes (Signed)
Medical screening examination/treatment/procedure(s) were performed by non-physician practitioner and as supervising physician I was immediately available for consultation/collaboration.   Shelda Jakes, MD 07/10/11 1025

## 2011-10-03 ENCOUNTER — Emergency Department (HOSPITAL_COMMUNITY)
Admission: EM | Admit: 2011-10-03 | Discharge: 2011-10-03 | Disposition: A | Discharge status: Home / Self Care | Payer: Medicare PPO | Attending: Emergency Medicine | Admitting: Emergency Medicine

## 2011-10-03 ENCOUNTER — Encounter (HOSPITAL_COMMUNITY): Payer: Self-pay

## 2011-10-03 ENCOUNTER — Emergency Department (HOSPITAL_COMMUNITY): Payer: Medicare PPO

## 2011-10-03 DIAGNOSIS — R5381 Other malaise: Secondary | ICD-10-CM | POA: Insufficient documentation

## 2011-10-03 DIAGNOSIS — J3489 Other specified disorders of nose and nasal sinuses: Secondary | ICD-10-CM | POA: Insufficient documentation

## 2011-10-03 DIAGNOSIS — R059 Cough, unspecified: Secondary | ICD-10-CM | POA: Insufficient documentation

## 2011-10-03 DIAGNOSIS — I10 Essential (primary) hypertension: Secondary | ICD-10-CM | POA: Insufficient documentation

## 2011-10-03 DIAGNOSIS — B1920 Unspecified viral hepatitis C without hepatic coma: Secondary | ICD-10-CM | POA: Insufficient documentation

## 2011-10-03 DIAGNOSIS — J45909 Unspecified asthma, uncomplicated: Secondary | ICD-10-CM | POA: Insufficient documentation

## 2011-10-03 DIAGNOSIS — J40 Bronchitis, not specified as acute or chronic: Secondary | ICD-10-CM | POA: Insufficient documentation

## 2011-10-03 DIAGNOSIS — R05 Cough: Secondary | ICD-10-CM | POA: Insufficient documentation

## 2011-10-03 DIAGNOSIS — R509 Fever, unspecified: Secondary | ICD-10-CM | POA: Insufficient documentation

## 2011-10-03 DIAGNOSIS — IMO0001 Reserved for inherently not codable concepts without codable children: Secondary | ICD-10-CM | POA: Insufficient documentation

## 2011-10-03 MED ORDER — AZITHROMYCIN 250 MG PO TABS: ORAL_TABLET | ORAL | Status: DC

## 2011-10-03 MED ORDER — ALBUTEROL SULFATE (5 MG/ML) 0.5% IN NEBU
5.0000 mg | INHALATION_SOLUTION | Freq: Once | RESPIRATORY_TRACT | Status: AC
  Administered 2011-10-03: 5 mg via RESPIRATORY_TRACT
  Filled 2011-10-03: qty 1

## 2011-10-03 MED ORDER — PREDNISONE 10 MG PO TABS: ORAL_TABLET | ORAL | Status: DC

## 2011-10-03 MED ORDER — IPRATROPIUM BROMIDE 0.02 % IN SOLN
0.5000 mg | Freq: Once | RESPIRATORY_TRACT | Status: AC
  Administered 2011-10-03: 0.5 mg via RESPIRATORY_TRACT
  Filled 2011-10-03: qty 2.5

## 2011-10-03 MED ORDER — HYDROCOD POLST-CHLORPHEN POLST 10-8 MG/5ML PO LQCR
5.0000 mL | Freq: Once | ORAL | Status: AC
  Administered 2011-10-03: 5 mL via ORAL
  Filled 2011-10-03: qty 5

## 2011-10-03 MED ORDER — GUAIFENESIN-CODEINE 100-10 MG/5ML PO SYRP: 10.0000 mL | ORAL_SOLUTION | Freq: Three times a day (TID) | ORAL | Status: AC | PRN

## 2011-10-03 NOTE — ED Notes (Signed)
Pt presents with fever, cough, generalized body aches and cough for several days.

## 2011-10-03 NOTE — ED Provider Notes (Signed)
History     CSN: [id number]  Arrival date & time 10/03/11  1617   First MD Initiated Contact with Patient 10/03/11 1748      Chief Complaint  Patient presents with  . Cough  . Generalized Body Aches  . Fever  . Weakness    (Consider location/radiation/quality/duration/timing/severity/associated sxs/prior treatment) HPI Comments: Patient complains of occasionally productive cough, generalized body aches and nasal congestion for one week. She states she developed a low-grade fever today. She states she has been using her albuterol inhaler and over-the-counter cough medications without relief.  Cough is occasionally productive of yellow sputum.  She also complains of tightness in her chest with excessive coughing.  She denies abdominal pain, shortness of breath, or urinary symptoms.  She states she has a history of asthma  Patient is a 66 y.o. female presenting with cough, fever, and weakness. The history is provided by the patient. No language interpreter was used.  Cough This is a new problem. The current episode started more than 2 days ago. The problem occurs every few minutes. The problem has not changed since onset.The cough is productive of sputum. The maximum temperature recorded prior to her arrival was 100 to 100.9 F. The fever has been present for 1 to 2 days. Associated symptoms include chills, sweats, headaches, rhinorrhea, sore throat, myalgias and wheezing. Pertinent negatives include no ear congestion, no ear pain and no shortness of breath. She has tried cough syrup for the symptoms. The treatment provided no relief. She is a smoker. Her past medical history is significant for bronchitis and asthma. Her past medical history does not include pneumonia or COPD.  Fever Primary symptoms of the febrile illness include fever, headaches, cough, wheezing and myalgias. Primary symptoms do not include shortness of breath. The current episode started today. This is a new problem. The  problem has not changed since onset. The headache is associated with weakness.  The cough began 6 to 7 days ago. The cough is new. The cough is productive, harsh and hoarse. The sputum is yellow. It is exacerbated by smoking.  Wheezing began today. Wheezing occurs intermittently. The wheezing has been unchanged since its onset. It is unknown what precipitated the wheezing. The patient's medical history is significant for asthma. The patient's medical history does not include COPD.  The myalgias are associated with weakness.  Weakness The primary symptoms include headaches and fever.  The headache is associated with weakness.  Additional symptoms include weakness.    Past Medical History  Diagnosis Date  . Hypertension   . Asthma   . Arthritis   . Hep C w/o coma, chronic     Past Surgical History  Procedure Date  . Cholecystectomy   . Appendectomy   . Abdominal hysterectomy   . Breast surgery     History reviewed. No pertinent family history.  History  Substance Use Topics  . Smoking status: Current Everyday Smoker  . Smokeless tobacco: Not on file  . Alcohol Use: No    OB History    Grav Para Term Preterm Abortions TAB SAB Ect Mult Living                  Review of Systems  Constitutional: Positive for fever and chills.  HENT: Positive for sore throat and rhinorrhea. Negative for ear pain.   Respiratory: Positive for cough and wheezing. Negative for shortness of breath.   Musculoskeletal: Positive for myalgias.  Neurological: Positive for weakness and headaches.  All other  systems reviewed and are negative.    Allergies  Darvocet; Penicillins; and Tetracyclines & related  Home Medications   Current Outpatient Rx  Name Route Sig Dispense Refill  . ALBUTEROL SULFATE HFA 108 (90 BASE) MCG/ACT IN AERS Inhalation Inhale 2 puffs into the lungs every 6 (six) hours as needed. Shortness of breath     . ALBUTEROL SULFATE (2.5 MG/3ML) 0.083% IN NEBU Nebulization Take  2.5 mg by nebulization every 6 (six) hours as needed. For shortness of breath    . AMITRIPTYLINE HCL 10 MG PO TABS Oral Take 10 mg by mouth at bedtime.      Marland Kitchen AMLODIPINE BESYLATE 5 MG PO TABS Oral Take 5 mg by mouth daily.      . ASPIRIN EC 81 MG PO TBEC Oral Take 81 mg by mouth daily.      . ASPIRIN-ACETAMINOPHEN-CAFFEINE 250-250-65 MG PO TABS Oral Take 1 tablet by mouth as needed. For headache pain    . BECLOMETHASONE DIPROPIONATE 40 MCG/ACT IN AERS Inhalation Inhale 2 puffs into the lungs 2 (two) times daily.      . OMEGA-3 FATTY ACIDS 1000 MG PO CAPS Oral Take 2 g by mouth daily.      Marland Kitchen FLUTICASONE PROPIONATE 50 MCG/ACT NA SUSP Nasal Place 2 sprays into the nose daily.      . FUROSEMIDE 40 MG PO TABS Oral Take 40 mg by mouth daily.      . IBUPROFEN 800 MG PO TABS Oral Take 800 mg by mouth every 8 (eight) hours as needed. Pain      . LORATADINE 10 MG PO TABS Oral Take 5 mg by mouth daily.      Marland Kitchen OMEPRAZOLE 40 MG PO CPDR Oral Take 40 mg by mouth 2 (two) times daily.      Marland Kitchen PAROXETINE HCL 20 MG PO TABS Oral Take 20 mg by mouth 2 (two) times daily.     Marland Kitchen POTASSIUM CHLORIDE CRYS ER 20 MEQ PO TBCR Oral Take 20 mEq by mouth daily.     . TRAMADOL HCL 50 MG PO TABS Oral Take 50 mg by mouth every 6 (six) hours as needed. For pain    . TRAMADOL-ACETAMINOPHEN 37.5-325 MG PO TABS Oral Take 1 tablet by mouth every 6 (six) hours as needed. For pain    . VALSARTAN-HYDROCHLOROTHIAZIDE 160-12.5 MG PO TABS Oral Take 1 tablet by mouth daily.        BP 138/65  Pulse 71  Temp(Src) 99.1 F (37.3 C) (Oral)  Resp 20  Ht 5\' 5"  (1.651 m)  Wt 266 lb (120.657 kg)  BMI 44.26 kg/m2  SpO2 100%  Physical Exam  Nursing note and vitals reviewed. Constitutional: She is oriented to person, place, and time. She appears well-developed and well-nourished. No distress.  HENT:  Head: Normocephalic and atraumatic.  Mouth/Throat: Oropharynx is clear and moist. No oropharyngeal exudate.  Neck: Normal range of motion.  Neck supple.  Cardiovascular: Normal rate, regular rhythm and normal heart sounds.   Pulmonary/Chest: Effort normal. No respiratory distress. She has wheezes. She has no rales.       Coarse lung sounds bilaterally with inspir and expir wheezing.    Abdominal: Soft. She exhibits no distension. There is no tenderness.  Musculoskeletal: Normal range of motion. She exhibits no edema and no tenderness.  Lymphadenopathy:    She has no cervical adenopathy.  Neurological: She is alert and oriented to person, place, and time. She exhibits normal muscle tone. Coordination normal.  Skin: Skin is warm and dry.    ED Course  Procedures (including critical care time)  Labs Reviewed - No data to display Dg Chest 2 View  10/03/2011  *RADIOLOGY REPORT*  Clinical Data: Cough.  Body headache.  Fever.  Weakness.  CHEST - 2 VIEW  Comparison: 07/09/2011  Findings: Heart size is normal.  Mediastinal shadows are unremarkable except calcification of the thoracic aorta.  There is mild bronchial thickening but no infiltrate, mass, effusion or collapse.  No significant bony finding.  IMPRESSION: Bronchial thickening consistent with bronchitis.  No consolidation or collapse.  Original Report Authenticated By: Thomasenia Sales, M.D.        MDM      Patient is alert, no acute distress. Chest low-grade fever without hypoxia, tachypnea, or tachycardia. Mucous membranes are moist. She is nontoxic appearing. Lung sounds are coarse bilaterally with scattered inspiratory and expiratory wheezing. I will prescribe antibiotic, cough medications, and steroids. Patient has albuterol nebulizer solution at home she agrees to close followup with her primary care physician, Dr. Felecia Shelling   Pt feels improved after observation and/or treatment in ED. Patient / Family / Caregiver understand and agree with initial ED impression and plan with expectations set for ED visit. Pt stable in ED with no significant deterioration in condition.    Catrena Vari L. Karns, [place] 10/04/11 2212

## 2011-10-03 NOTE — ED Notes (Signed)
Breathing tx in progress.

## 2011-10-03 NOTE — ED Notes (Signed)
Alert, talking, says she needs a breathing treatment., says she has been sick for 3-4 days, with fever, cough,body aches, chest and head pain.

## 2011-10-04 NOTE — ED Provider Notes (Signed)
Medical screening examination/treatment/procedure(s) were performed by non-physician practitioner and as supervising physician I was immediately available for consultation/collaboration.  Aubert Choyce S. Kenyatta Keidel, MD 10/04/11 2226 

## 2011-12-01 ENCOUNTER — Ambulatory Visit (HOSPITAL_COMMUNITY)
Admission: RE | Admit: 2011-12-01 | Discharge: 2011-12-01 | Disposition: A | Discharge status: Home / Self Care | Payer: Medicare PPO | Source: Ambulatory Visit | Attending: Internal Medicine | Admitting: Internal Medicine

## 2011-12-01 ENCOUNTER — Other Ambulatory Visit (HOSPITAL_COMMUNITY): Payer: Self-pay | Admitting: Internal Medicine

## 2011-12-01 DIAGNOSIS — M25569 Pain in unspecified knee: Secondary | ICD-10-CM | POA: Insufficient documentation

## 2011-12-01 DIAGNOSIS — R52 Pain, unspecified: Secondary | ICD-10-CM

## 2011-12-01 DIAGNOSIS — M25559 Pain in unspecified hip: Secondary | ICD-10-CM | POA: Insufficient documentation

## 2012-02-16 ENCOUNTER — Encounter: Payer: Self-pay | Admitting: Orthopedic Surgery

## 2012-02-16 ENCOUNTER — Ambulatory Visit (INDEPENDENT_AMBULATORY_CARE_PROVIDER_SITE_OTHER): Payer: Medicare PPO | Admitting: Orthopedic Surgery

## 2012-02-16 ENCOUNTER — Ambulatory Visit (INDEPENDENT_AMBULATORY_CARE_PROVIDER_SITE_OTHER): Payer: Medicare PPO

## 2012-02-16 VITALS — BP 140/80 | Ht 65.0 in | Wt 267.0 lb

## 2012-02-16 DIAGNOSIS — M25569 Pain in unspecified knee: Secondary | ICD-10-CM

## 2012-02-16 DIAGNOSIS — M25562 Pain in left knee: Secondary | ICD-10-CM

## 2012-02-16 DIAGNOSIS — M549 Dorsalgia, unspecified: Secondary | ICD-10-CM | POA: Insufficient documentation

## 2012-02-16 DIAGNOSIS — M25561 Pain in right knee: Secondary | ICD-10-CM | POA: Insufficient documentation

## 2012-02-16 NOTE — Progress Notes (Signed)
  Subjective:    Patient ID: Megan Odom, female    DOB: 04-02-1946, 66 y.o.   MRN: [id number]   Referral from Dr. Felecia Shelling  Chief Complaint  Patient presents with  . Knee Pain    bilateral knee pain x years, gradual onset, no known injury    Knee Pain    This is a 66 year old female with multiple medical problems he takes multiple medications and has morbid obesity who presents with bilateral leg pain and bilateral knee pain. She has a history of lumbar disc disease with an MRI from 2007 showing she had spinal stenosis back then. She complains of sharp throbbing stabbing 8/10 constant pain with numbness and tingling in her lower extremities and anterior knee pain and difficulty climbing stairs. She has difficulty standing she's had multiple episodes of her legs giving way. She takes tramadol, ibuprofen and Lortab for pain relief. She has not had any other treatment at this point  Past Medical History  Diagnosis Date  . Hypertension   . Asthma   . Arthritis   . Hep C w/o coma, chronic      Review of Systems Weight gain, blurred vision, redness and watering of the eyes. Heart palpitations. Shortness of breath, wheezing, cough, snoring. Nausea. Joint pain and stiffness muscle pain. Rash and itching. Anxiety. Heat intolerance. Seasonal allergies.      Objective:   Physical Exam  BP 140/80  Ht 5\' 5"  (1.651 m)  Wt 267 lb (121.11 kg)  BMI 44.43 kg/m2 General appearance obesity, well-groomed, no deformity. She is oriented x3 Her mood and affect are normal Her spine is noted for increased lumbar lordosis She ambulates without assistive device but has a waddling gait  Lower extremities she is limited for flexion by her abdominal obesity. Knee flexion 115. Knee alignment valgus. Knee stability normal. Strength in lower extremities normal muscle tone. Skin normal.  She has crepitance and positive patellofemoral compression test for eliciting pain.  Distal pulses are normal,  minimal edema. Normal temperature. No lymphadenopathy. Normal sensation. Straight leg raise reproduces back pain on the right at 45 negative on the left. Balance was normal.  Imaging  MRI from 2007 which showed spinal stenosis at that time primarily at L4 and 5 and then multilevel disc disease not as bad at other levels. A right knee film shows some mild degenerative change of left knee film today shows mild degenerative changes.      Assessment & Plan:  The x-rays show that she does have and very minimal knee pathology. She says her right hip hurts and it's actually coming from her back. She has spinal stenosis and anterior patellofemoral disease.  Surgery cannot fix her problem she needs to have a significant weight loss program. She needs to pool therapy. She needs physical therapy. If she does not improve she should have an MRI and a referral to a lumbar spine surgeon

## 2012-02-16 NOTE — Patient Instructions (Addendum)
START PHYSICAL THERAPY   CONTINUE IBUPROFEN LORTAB TRAMADOL

## 2012-02-17 ENCOUNTER — Ambulatory Visit (HOSPITAL_COMMUNITY)
Admission: RE | Admit: 2012-02-17 | Discharge: 2012-02-17 | Disposition: A | Discharge status: Home / Self Care | Payer: Medicare PPO | Source: Ambulatory Visit | Attending: Orthopedic Surgery | Admitting: Orthopedic Surgery

## 2012-02-17 DIAGNOSIS — M545 Low back pain, unspecified: Secondary | ICD-10-CM | POA: Insufficient documentation

## 2012-02-17 DIAGNOSIS — R262 Difficulty in walking, not elsewhere classified: Secondary | ICD-10-CM | POA: Insufficient documentation

## 2012-02-17 DIAGNOSIS — M25569 Pain in unspecified knee: Secondary | ICD-10-CM | POA: Insufficient documentation

## 2012-02-17 DIAGNOSIS — M6281 Muscle weakness (generalized): Secondary | ICD-10-CM | POA: Insufficient documentation

## 2012-02-17 DIAGNOSIS — R29898 Other symptoms and signs involving the musculoskeletal system: Secondary | ICD-10-CM | POA: Insufficient documentation

## 2012-02-17 DIAGNOSIS — R269 Unspecified abnormalities of gait and mobility: Secondary | ICD-10-CM | POA: Insufficient documentation

## 2012-02-17 DIAGNOSIS — IMO0001 Reserved for inherently not codable concepts without codable children: Secondary | ICD-10-CM | POA: Insufficient documentation

## 2012-02-17 DIAGNOSIS — M25559 Pain in unspecified hip: Secondary | ICD-10-CM | POA: Insufficient documentation

## 2012-02-17 NOTE — Evaluation (Addendum)
Physical Therapy Evaluation  Patient Details  Name: Megan Odom MRN: [id number] Date of Birth: 1945-12-27  Today's Date: 02/17/2012 Time: 0850-0935 PT Time Calculation (min): 45 min  Visit#: 1  of 16   Re-eval: 03/18/12 Assessment Diagnosis: lumbar pain, B knee pain Next MD Visit: July  Authorization: [facility] sent but not authorized yet  Authorization Time Period:    Authorization Visit#:   of     Past Medical History:  Past Medical History  Diagnosis Date  . Hypertension   . Asthma   . Arthritis   . Hep C w/o coma, chronic    Past Surgical History:  Past Surgical History  Procedure Date  . Cholecystectomy   . Appendectomy   . Abdominal hysterectomy   . Breast surgery     Subjective Symptoms/Limitations Symptoms: Pt has had radicular back pain into R hip and pain in both knees for a long time.  She opted to come to therapy due to the fact that she states she is starting to become very limited in her ablility to do anything.  Pertinent History: DM; obesity How long can you sit comfortably?: Able to sit for 45 minutes but she has significant increase pain. How long can you stand comfortably?: Able to  stand for ten minutes How long can you walk comfortably?: able to walk for less than five minutes  Patient Stated Goals: decrease pain and improve functional abiltity Pain Assessment Currently in Pain?: Yes Pain Score:   8 Pain Location: Back Pain Orientation: Lower Pain Type: Chronic pain Pain Radiating Towards: R hip Pain Onset: More than a month ago Pain Frequency: Intermittent Pain Relieving Factors: rest Multiple Pain Sites: Yes  Cognition/Observation Cognition Overall Cognitive Status: Appears within functional limits for tasks assessed  Sensation/Coordination/Flexibility/Functional Tests Functional Tests Functional Tests: LEFS 16/64  Assessment RLE Strength Right Hip Flexion: 3/5 Right Hip Extension: 3-/5 Right Hip ABduction: 3/5 Right Hip  ADduction: 3-/5 Right Knee Flexion: 3+/5 Right Knee Extension: 4/5 Right Ankle Dorsiflexion: 4/5 LLE Strength Left Hip Flexion: 4/5 Left Hip Extension: 3-/5 Left Hip ABduction: 3/5 Left Hip ADduction: 3/5 Left Knee Flexion: 3/5 Left Knee Extension: 4/5 Lumbar AROM Lumbar Flexion: decreased 30% increased pain upon return Lumbar Extension: wnl improved pain Lumbar - Right Side Bend: wfl Lumbar - Left Side Bend: wfl Lumbar - Right Rotation: wfl Lumbar - Left Rotation: wfl  Exercise/Treatments    Seated Other Seated Lumbar Exercises: adductor isometric x 10 Supine Ab Set: 10 reps Glut Set: 10 reps Sidelying Hip Abduction: 10 reps    Physical Therapy Assessment and Plan PT Assessment and Plan Clinical Impression Statement: Pt with multiple complaints whos radicular back pain and knee pain are complicated by obesity and significant mm weakness who will benefit from therapy to begin a lumbar stabilization program. PT Frequency: Min 2X/week PT Duration: 8 weeks PT Treatment/Interventions: Therapeutic exercise;Therapeutic activities;Functional mobility training;Patient/family education PT Plan: begin bent knee raise, bridge, clam, floating SLR(start knees bent extend one leg small pulses up and down) bike next treatment. 3rd will begin prone ex pt will need picture of new ex    Goals Home Exercise Program Pt will Perform Home Exercise Program: Independently PT Short Term Goals Time to Complete Short Term Goals: 4 weeks PT Short Term Goal 1: I HEP PT Short Term Goal 2: Pain decreased by 3 levels PT Short Term Goal 3: Pt to be able to stand for 15 minutes without pain PT Short Term Goal 4: P to be able to  walk for 15 minutes without pain PT Long Term Goals Time to Complete Long Term Goals: 8 weeks PT Long Term Goal 1: I in advance HEp PT Long Term Goal 2: Pain to be no greater than a 4 80% of the day Long Term Goal 3: Pt to be able to sit for an hour  Long Term Goal 4: Pt to  be able to walk for 20 minutes. PT Long Term Goal 5: Pt to be able to stand for 20 minutes Additional PT Long Term Goals?: Yes PT Long Term Goal 6: LEFS to be increased to at least 36/64   GP G-code based on LEFS CL  Goal CI Problem List Patient Active Problem List  Diagnosis  . Back pain  . Bilateral anterior knee pain  . Knee pain, left  . Difficulty in walking  . Bilateral leg weakness    PT - End of Session Activity Tolerance: Patient tolerated treatment well General Behavior During Session: [facility] for tasks performed Cognition: [facility] for tasks performed PT Plan of Care PT Home Exercise Plan: given    Megan Odom,Megan Odom 02/17/2012, 11:26 AM  Physician Documentation Your signature is required to indicate approval of the treatment plan as stated above.  Please sign and either send electronically or make a copy of this report for your files and return this physician signed original.   Please mark one 1.__approve of plan  2. ___approve of plan with the following conditions.   ______________________________                                                          _____________________ Physician Signature                                                                                                             Date

## 2012-02-24 ENCOUNTER — Ambulatory Visit (HOSPITAL_COMMUNITY)
Admission: RE | Admit: 2012-02-24 | Discharge: 2012-02-24 | Disposition: A | Discharge status: Home / Self Care | Payer: Medicare HMO | Source: Ambulatory Visit | Attending: Internal Medicine | Admitting: Internal Medicine

## 2012-02-24 DIAGNOSIS — R269 Unspecified abnormalities of gait and mobility: Secondary | ICD-10-CM | POA: Insufficient documentation

## 2012-02-24 DIAGNOSIS — M545 Low back pain, unspecified: Secondary | ICD-10-CM | POA: Insufficient documentation

## 2012-02-24 DIAGNOSIS — IMO0001 Reserved for inherently not codable concepts without codable children: Secondary | ICD-10-CM | POA: Insufficient documentation

## 2012-02-24 DIAGNOSIS — M25569 Pain in unspecified knee: Secondary | ICD-10-CM | POA: Insufficient documentation

## 2012-02-24 DIAGNOSIS — M6281 Muscle weakness (generalized): Secondary | ICD-10-CM | POA: Insufficient documentation

## 2012-02-24 DIAGNOSIS — R262 Difficulty in walking, not elsewhere classified: Secondary | ICD-10-CM

## 2012-02-24 DIAGNOSIS — M25559 Pain in unspecified hip: Secondary | ICD-10-CM | POA: Insufficient documentation

## 2012-02-24 DIAGNOSIS — R29898 Other symptoms and signs involving the musculoskeletal system: Secondary | ICD-10-CM

## 2012-02-24 NOTE — Progress Notes (Signed)
Physical Therapy Treatment Patient Details  Name: Megan Odom MRN: [id number] Date of Birth: May 23, 1946  Today's Date: 02/24/2012 Time: 1125-1200 PT Time Calculation (min): 35 min Charges:  Visit#: 2  of 16   Re-eval: 03/18/12    Authorization: [employer] sent but not authorized yet   Subjective: Symptoms/Limitations Symptoms: Pt reports that she is doing ok.  She still is having pain to her R knee, R hip and R shoulder  Precautions/Restrictions     Exercise/Treatments Aerobic Tread Mill: 2 min @1 .4 mph, 2 HHA w/max cueing for technique and posture Standing Other Standing Lumbar Exercises: Gait Training w/max cueing for foot placement 4 RT in long hallway Supine Ab Set: 5 reps;Other (comment) (10 sec holds) Glut Set: 5 reps;Other (comment) (10 sec holds) Clam: 10 reps;Other (comment) (BLE w/ab set) Bent Knee Raise: 10 reps (BLE) Bridge: 10 reps (w/ab set) Straight Leg Raise: 5 reps (floating) Isometric Hip Flexion: 5 reps;5 seconds;Other (comment) (BLE) Sidelying Hip Abduction: 10 reps;Other (comment) (BLE) Prone  Straight Leg Raise: 10 reps (BLE) Other Prone Lumbar Exercises: heel squeeze 7x5 sec holds  Physical Therapy Assessment and Plan PT Assessment and Plan Clinical Impression Statement: Reviewed HEP, pt reports mod pain with gluteal sets which resolves over time.  Required mod-max cueing to maintain ab set w/activities.  has significant weakness and impaired coordination to RLE and R TrA compared to L. Continues to struggle with dyspnea during mat exercises and TM training.   Has significant improvement in independent gait mechanics after treatment.  PT Plan: bike next treatment. 3rd will begin prone ex pt will need picture of new ex     Goals    Problem List Patient Active Problem List  Diagnosis  . Back pain  . Bilateral anterior knee pain  . Knee pain, left  . Difficulty in walking  . Bilateral leg weakness    PT - End of Session Activity  Tolerance: Patient tolerated treatment well General Behavior During Session: [facility] for tasks performed Cognition: [facility] for tasks performed PT Plan of Care PT Home Exercise Plan: updated w iso hip, bridges, SLR (prone and supine), heel squeezes, bent knee raise PT Patient Instructions: encourgaed to continue with her HEP 2-3x a day for best results and to continue with proper gait mechanics Consulted and Agree with Plan of Care: Patient  GP    Cady Hafen 02/24/2012, 12:17 PM

## 2012-03-01 ENCOUNTER — Ambulatory Visit (HOSPITAL_COMMUNITY)
Admission: RE | Admit: 2012-03-01 | Discharge: 2012-03-01 | Disposition: A | Discharge status: Home / Self Care | Payer: Medicare HMO | Source: Ambulatory Visit | Attending: Internal Medicine | Admitting: Internal Medicine

## 2012-03-01 DIAGNOSIS — R29898 Other symptoms and signs involving the musculoskeletal system: Secondary | ICD-10-CM

## 2012-03-01 DIAGNOSIS — R262 Difficulty in walking, not elsewhere classified: Secondary | ICD-10-CM

## 2012-03-01 NOTE — Progress Notes (Signed)
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 [place] and felt a sharp pain in her knee    Exercise/Treatments      Aerobic Stationary Bike: 1.0x 6.0 Tread Mill: 1.2 x 3:30   Standing Heel Raises: 10 reps Functional Squats: 10 reps Seated Long Arc Quad on Chair: Strengthening;10 reps;Weights;Limitations LAQ on Chair Weights (lbs): 4 LAQ on Chair Limitations: sit tall abs tight Supine Bent Knee Raise: 10 reps Bridge: 10 reps Straight Leg Raise: 10 reps (floating) Isometric Hip Flexion: 10 reps Sidelying Hip Abduction: 10 reps Prone  Straight Leg Raise: 10 reps Other Prone Lumbar Exercises:  (heelsqueeze x 15)      Physical Therapy Assessment and Plan PT Assessment and Plan Clinical Impression Statement: Pt improving with stabilization but still needs verbal cuing for proper technique.  Added new ex without difficulty Rehab Potential: Good PT Plan: begin SAR and opposite arm leg raise next treatment.  Give pictures of new ex for Advance HEP    Goals    Problem List Patient Active Problem List  Diagnosis  . Back pain  . Bilateral anterior knee pain  . Knee pain, left  . Difficulty in walking  . Bilateral leg weakness    PT - End of Session Activity Tolerance: Patient tolerated treatment well General Behavior During Session: [facility] for tasks performed Cognition: [facility] for tasks performed PT Plan of Care PT Home Exercise Plan: added LAQ, heel raise and minis squats.    Melynda Krzywicki,CINDY 03/01/2012, 8:54 AM

## 2012-03-02 ENCOUNTER — Ambulatory Visit (HOSPITAL_COMMUNITY)
Admission: RE | Admit: 2012-03-02 | Discharge: 2012-03-02 | Disposition: A | Discharge status: Home / Self Care | Payer: Medicare HMO | Source: Ambulatory Visit | Attending: Physical Therapy | Admitting: Physical Therapy

## 2012-03-02 NOTE — Progress Notes (Signed)
Physical Therapy Treatment Patient Details  Name: Megan Odom MRN: [id number] Date of Birth: [birth year]  Today's Date: 03/02/2012 Time: 7829-5621 PT Time Calculation (min): 43 min Charges: 3' manual, 106' TE Visit#: 4  of 16   Re-eval: 03/18/12    Authorization: [employer] sent but not authorized yet   Authorization Time Period:    Authorization Visit#:   of     Subjective: Symptoms/Limitations Symptoms: Pt reports she contiues to have pain in her R knee and it is sharp pain.  Pain Assessment Currently in Pain?: Yes Pain Score:   8 Pain Location: Knee Pain Orientation: Right  Exercise/Treatments Stretches Prone on Elbows Stretch: Limitations Prone on Elbows Stretch Limitations: 2 minutes Aerobic Stationary Bike: 6 min 1.5 for activity tolerance and strength.  cueing to keep feet straight up.  Tread Mill: 1.2 x2 minutes w/max cueing for stride length, foot mechanics and posture Standing Heel Raises: 15 reps;Limitations Heel Raises Limitations: Toe Raises:  Functional Squats: 10 reps;Limitations Functional Squats Limitations: TC's and VC's for proper activation Other Standing Lumbar Exercises: Side stepping w/heel leading 2 RT Sidelying Clam: 10 reps;Other (comment) (10 sec holds) Hip Abduction: 10 reps;Other (comment) (BLE) Prone  Single Arm Raise: Right;Left;10 reps Straight Leg Raise: 10 reps (BLE)  Manual Therapy Manual Therapy: Joint mobilization Joint Mobilization: Patellar mobs in all directions, Grade II to proximal tibia and fibular head to decrease pain.  Physical Therapy Assessment and Plan PT Assessment and Plan Clinical Impression Statement: Pt had mild improvement after joint mobilization to R knee. Pt is improving her core coordination and able to independently contract TrA musculature.  Did not add Opp Arm/eg in prone today secodnary to continued posterior weakness and impaired LE coordination with SLR.  Requires max cueing for proper gait mechanics  and is greatly limited by overall generalized core and LE weakness.  Rehab Potential: Good PT Plan: Continue to progress LE and core strength and gait mechanics.     Problem List Patient Active Problem List  Diagnosis  . Back pain  . Bilateral anterior knee pain  . Knee pain, left  . Difficulty in walking  . Bilateral leg weakness    PT - End of Session Activity Tolerance: Patient tolerated treatment well General Behavior During Session: [facility] for tasks performed PT Plan of Care PT Patient Instructions: Continue with core exercises when low back starts to hurt.   Emeline Simpson 03/02/2012, 10:15 AM

## 2012-03-07 ENCOUNTER — Other Ambulatory Visit (HOSPITAL_COMMUNITY): Payer: Self-pay | Admitting: Internal Medicine

## 2012-03-07 DIAGNOSIS — N6320 Unspecified lump in the left breast, unspecified quadrant: Secondary | ICD-10-CM

## 2012-03-08 ENCOUNTER — Ambulatory Visit (HOSPITAL_COMMUNITY): Payer: Medicare PPO | Admitting: Physical Therapy

## 2012-03-08 ENCOUNTER — Emergency Department (HOSPITAL_COMMUNITY)
Admission: EM | Admit: 2012-03-08 | Discharge: 2012-03-08 | Disposition: A | Discharge status: Home / Self Care | Payer: Medicare HMO | Attending: Emergency Medicine | Admitting: Emergency Medicine

## 2012-03-08 ENCOUNTER — Encounter (HOSPITAL_COMMUNITY): Payer: Self-pay

## 2012-03-08 DIAGNOSIS — I10 Essential (primary) hypertension: Secondary | ICD-10-CM | POA: Insufficient documentation

## 2012-03-08 DIAGNOSIS — F17200 Nicotine dependence, unspecified, uncomplicated: Secondary | ICD-10-CM | POA: Insufficient documentation

## 2012-03-08 DIAGNOSIS — N61 Mastitis without abscess: Secondary | ICD-10-CM

## 2012-03-08 DIAGNOSIS — M129 Arthropathy, unspecified: Secondary | ICD-10-CM | POA: Insufficient documentation

## 2012-03-08 DIAGNOSIS — B1920 Unspecified viral hepatitis C without hepatic coma: Secondary | ICD-10-CM | POA: Insufficient documentation

## 2012-03-08 DIAGNOSIS — N63 Unspecified lump in unspecified breast: Secondary | ICD-10-CM | POA: Insufficient documentation

## 2012-03-08 MED ORDER — SULFAMETHOXAZOLE-TRIMETHOPRIM 800-160 MG PO TABS: 1.0000 | ORAL_TABLET | Freq: Two times a day (BID) | ORAL | Status: AC

## 2012-03-08 MED ORDER — HYDROCODONE-ACETAMINOPHEN 5-325 MG PO TABS: 1.0000 | ORAL_TABLET | Freq: Four times a day (QID) | ORAL | Status: AC | PRN

## 2012-03-08 NOTE — ED Notes (Signed)
Big knot in my left breast per pt. Hurts to the touch per pt. Draining a brownish color per pt.

## 2012-03-08 NOTE — ED Provider Notes (Signed)
History  This chart was scribed for Megan Lennert, MD by Erskine Emery. This patient was seen in room APA03/APA03 and the patient's care was started at 07:06.   CSN: [id number]  Arrival date & time 03/08/12  0510   First MD Initiated Contact with Patient 03/08/12 [phone]      Chief Complaint  Patient presents with  . Breast Mass    (Consider location/radiation/quality/duration/timing/severity/associated sxs/prior Treatment)  Patient is a 66 y.o. female presenting with chest pain. The history is provided by the patient. No language interpreter was used.  Chest Pain The chest pain began yesterday. Chest pain occurs constantly. The chest pain is unchanged. The severity of the pain is moderate. The pain does not radiate. Pertinent negatives for primary symptoms include no cough and no abdominal pain.  Her past medical history is significant for hypertension.  Pertinent negatives for past medical history include no seizures.   Dr. Felecia Shelling is the pt's PCP.   Past Medical History  Diagnosis Date  . Hypertension   . Asthma   . Arthritis   . Hep C w/o coma, chronic     Past Surgical History  Procedure Date  . Cholecystectomy   . Appendectomy   . Abdominal hysterectomy   . Breast surgery     Family History  Problem Relation Age of Onset  . Arthritis    . Cancer    . Asthma    . Diabetes      History  Substance Use Topics  . Smoking status: Current Everyday Smoker  . Smokeless tobacco: Not on file  . Alcohol Use: No    OB History    Grav Para Term Preterm Abortions TAB SAB Ect Mult Living                  Review of Systems  HENT: Negative for congestion, sinus pressure and ear discharge.   Eyes: Negative for discharge.  Respiratory: Negative for cough.   Cardiovascular: Negative for chest pain.  Gastrointestinal: Negative for abdominal pain and diarrhea.  Genitourinary: Negative for frequency and hematuria.  Musculoskeletal: Negative for back pain.  Skin:  Negative for rash.  Neurological: Negative for seizures and headaches.  Hematological: Negative.   Psychiatric/Behavioral: Negative for hallucinations.    Allergies  Darvocet; Penicillins; and Tetracyclines & related  Home Medications   Current Outpatient Rx  Name Route Sig Dispense Refill  . ALBUTEROL SULFATE HFA 108 (90 BASE) MCG/ACT IN AERS Inhalation Inhale 2 puffs into the lungs every 6 (six) hours as needed. Shortness of breath     . ALBUTEROL SULFATE (2.5 MG/3ML) 0.083% IN NEBU Nebulization Take 2.5 mg by nebulization every 6 (six) hours as needed. For shortness of breath    . AMITRIPTYLINE HCL 10 MG PO TABS Oral Take 10 mg by mouth at bedtime.      . ASPIRIN EC 81 MG PO TBEC Oral Take 81 mg by mouth daily.      Marland Kitchen BENZONATATE PO Oral Take by mouth.    Marland Kitchen SLEEP AID PO Oral Take by mouth.    . OMEGA-3 FATTY ACIDS 1000 MG PO CAPS Oral Take 2 g by mouth daily.      Marland Kitchen FLUTICASONE PROPIONATE 50 MCG/ACT NA SUSP Nasal Place 2 sprays into the nose daily.      . FUROSEMIDE 40 MG PO TABS Oral Take 40 mg by mouth daily.      Marland Kitchen LORATADINE 10 MG PO TABS Oral Take 5 mg by mouth daily.      Marland Kitchen  OMEPRAZOLE 40 MG PO CPDR Oral Take 40 mg by mouth 2 (two) times daily.      Marland Kitchen PAROXETINE HCL 20 MG PO TABS Oral Take 20 mg by mouth 2 (two) times daily.     Marland Kitchen POTASSIUM CHLORIDE CRYS ER 20 MEQ PO TBCR Oral Take 20 mEq by mouth daily.     . TRAMADOL HCL 50 MG PO TABS Oral Take 50 mg by mouth every 6 (six) hours as needed. For pain    . TRAMADOL-ACETAMINOPHEN 37.5-325 MG PO TABS Oral Take 1 tablet by mouth every 6 (six) hours as needed. For pain    . VALSARTAN-HYDROCHLOROTHIAZIDE 160-12.5 MG PO TABS Oral Take 1 tablet by mouth daily.      Marland Kitchen AMLODIPINE BESYLATE 5 MG PO TABS Oral Take 5 mg by mouth daily.      . ASPIRIN-ACETAMINOPHEN-CAFFEINE 250-250-65 MG PO TABS Oral Take 1 tablet by mouth as needed. For headache pain    . AZITHROMYCIN 250 MG PO TABS  Take two tablets on day one, then one tab qd days 2-5 6  tablet 0  . BECLOMETHASONE DIPROPIONATE 40 MCG/ACT IN AERS Inhalation Inhale 2 puffs into the lungs 2 (two) times daily.      . IBUPROFEN 800 MG PO TABS Oral Take 800 mg by mouth every 8 (eight) hours as needed. Pain      . PREDNISONE 10 MG PO TABS  Take 6 tablets day one, 5 tablets day two, 4 tablets day three, 3 tablets day four, 2 tablets day five, then 1 tablet day six 21 tablet 0    Triage Vitals: BP 123/59  Pulse 62  Temp 98.8 F (37.1 C) (Oral)  Resp 14  SpO2 96%  Physical Exam  Constitutional: She is oriented to person, place, and time. She appears well-developed.  HENT:  Head: Normocephalic and atraumatic.  Eyes: Conjunctivae and EOM are normal. No scleral icterus.  Neck: Neck supple. No thyromegaly present.  Cardiovascular: Normal rate and regular rhythm.  Exam reveals no gallop and no friction rub.   No murmur heard. Pulmonary/Chest: No stridor. She has no wheezes. She has no rales. She exhibits no tenderness.  Abdominal: She exhibits no distension. There is no tenderness. There is no rebound.  Musculoskeletal: Normal range of motion. She exhibits no edema.  Lymphadenopathy:    She has no cervical adenopathy.  Neurological: She is oriented to person, place, and time. Coordination normal.  Skin: No rash noted. There is erythema.       Areola on left breast is swollen, tender, and erythematous.   Psychiatric: She has a normal mood and affect. Her behavior is normal.    ED Course  Procedures (including critical care time)  DIAGNOSTIC STUDIES: Oxygen Saturation is 96% on room air, adequate by my interpretation.    COORDINATION OF CARE:  7:19--I discussed treatment plan including antibiotics with pt and pt agreed. I instructed the pt to follow up with her PCP.     Labs Reviewed - No data to display No results found.   No diagnosis found.    MDM   The chart was scribed for me under my direct supervision.  I personally performed the history, physical, and  medical decision making and all procedures in the evaluation of this patient.Megan Lennert, MD 03/08/12 [phone]

## 2012-03-09 ENCOUNTER — Ambulatory Visit (HOSPITAL_COMMUNITY)
Admission: RE | Admit: 2012-03-09 | Discharge: 2012-03-09 | Disposition: A | Discharge status: Home / Self Care | Payer: Medicare HMO | Source: Ambulatory Visit | Attending: Internal Medicine | Admitting: Internal Medicine

## 2012-03-09 NOTE — Progress Notes (Signed)
Physical Therapy Treatment Patient Details  Name: Megan Odom MRN: [id number] Date of Birth: Oct 24, 1945  Today's Date: 03/09/2012 Time: 0825 (Pt 25' late for appt; she thought it was at 8:30)-0849 PT Time Calculation (min): 24 min  Visit#: 5  of 16   Re-eval: 03/18/12 Charges: Therex x 23'  Authorization: [employer] sent but not authorized yet     Subjective: Symptoms/Limitations Symptoms: Pt states that she had to go to ED yesterday because a knot in her breat began to drain. Pt states she is going to her primary as soon as possible.  Pain Assessment Currently in Pain?: Yes Pain Score:   8 Pain Location: Knee Pain Orientation: Right   Exercise/Treatments Standing TM 1.2 x 5' (Decreased need for gait cueing this session. Completed more for strengthening and activity tolerance.) Heel Raises: 15 reps;Limitations Heel Raises Limitations: Toe Raises x15 Functional Squats: 10 reps Other Standing Lumbar Exercises: Side stepping w/heel leading 2 RT Seated Long Arc Quad on Chair: 15 reps LAQ on Chair Weights (lbs): 4 LAQ on Chair Limitations: sit tall abs tight  Physical Therapy Assessment and Plan PT Assessment and Plan Clinical Impression Statement: Tx limited by time. Pt is easily fatigued and requires frequent rest breaks. Pt also requires constant cueing for posture. Pt does display improved gait mechanics on tm and decreased need for cueing with functional squats.  Rehab Potential: Good PT Plan: Continue to progress LE and core strength and gait mechanics.      Problem List Patient Active Problem List  Diagnosis  . Back pain  . Bilateral anterior knee pain  . Knee pain, left  . Difficulty in walking  . Bilateral leg weakness    PT - End of Session Activity Tolerance: Patient tolerated treatment well General Behavior During Session: [facility] for tasks performed Cognition: [facility] for tasks performed   Seth Bake, PTA 03/09/2012, 8:54 AM

## 2012-03-14 ENCOUNTER — Ambulatory Visit (HOSPITAL_COMMUNITY): Admission: RE | Admit: 2012-03-14 | Payer: Medicare HMO | Source: Ambulatory Visit

## 2012-03-15 ENCOUNTER — Ambulatory Visit (HOSPITAL_COMMUNITY): Payer: Medicare PPO | Admitting: Physical Therapy

## 2012-03-16 ENCOUNTER — Ambulatory Visit (HOSPITAL_COMMUNITY)
Admission: RE | Admit: 2012-03-16 | Discharge: 2012-03-16 | Disposition: A | Discharge status: Home / Self Care | Payer: Medicare HMO | Source: Ambulatory Visit | Attending: Internal Medicine | Admitting: Internal Medicine

## 2012-03-16 DIAGNOSIS — R29898 Other symptoms and signs involving the musculoskeletal system: Secondary | ICD-10-CM

## 2012-03-16 DIAGNOSIS — R262 Difficulty in walking, not elsewhere classified: Secondary | ICD-10-CM

## 2012-03-16 NOTE — Progress Notes (Signed)
Physical Therapy Treatment Patient Details  Name: Megan Odom MRN: [id number] Date of Birth: 22-Oct-1945  Today's Date: 03/16/2012 Time: 0850-0935 PT Time Calculation (min): 45 min  Visit#: 6  of 16   Re-eval: 03/16/12    Authorization:    Authorization Time Period:    Authorization Visit#:   of     Subjective: Symptoms/Limitations Symptoms: Pt states her stomach is sore.  Pt states more knee pain than back today. Pain Assessment Pain Score:   8 Pain Location: Knee Pain Orientation: Right Pain Type: Chronic pain  Precautions/Restrictions     Exercise/Treatments   Aerobic Stationary Bike: 2.5 x 6' Tread Mill: 1.5 x 5'   Standing Heel Raises: 10 reps Functional Squat: 10 reps Rocker Board: 1 minute Seated Long Arc Quad: Both;10 reps;Weights Long Arc Quad Weight: 5 lbs. Supine Bridges: 10 reps Other Supine Knee Exercises: bent knee raise, isometric hip flexx 10     Physical Therapy Assessment and Plan PT Assessment and Plan Clinical Impression Statement: Pt added new stab ex.  PT needed constant verbal cue to keep back stability during exercises but was able to do correctly with verbal cuing. Pt requested BP at end of session BP 128/78. PT Plan: begin prone stab ex next treatment.  Use two pillow for lumbar support.    Goals    Problem List Patient Active Problem List  Diagnosis  . Back pain  . Bilateral anterior knee pain  . Knee pain, left  . Difficulty in walking  . Bilateral leg weakness    PT - End of Session Activity Tolerance: Patient tolerated treatment well  GP    Donaven Criswell,CINDY 03/16/2012, 9:40 AM

## 2012-03-21 ENCOUNTER — Other Ambulatory Visit (HOSPITAL_COMMUNITY): Payer: Self-pay | Admitting: Internal Medicine

## 2012-03-21 ENCOUNTER — Ambulatory Visit (HOSPITAL_COMMUNITY)
Admission: RE | Admit: 2012-03-21 | Discharge: 2012-03-21 | Disposition: A | Discharge status: Home / Self Care | Payer: Medicare HMO | Source: Ambulatory Visit | Attending: Internal Medicine | Admitting: Internal Medicine

## 2012-03-21 DIAGNOSIS — N6320 Unspecified lump in the left breast, unspecified quadrant: Secondary | ICD-10-CM

## 2012-03-21 DIAGNOSIS — N63 Unspecified lump in unspecified breast: Secondary | ICD-10-CM | POA: Insufficient documentation

## 2012-03-22 ENCOUNTER — Ambulatory Visit (HOSPITAL_COMMUNITY)
Admission: RE | Admit: 2012-03-22 | Discharge: 2012-03-22 | Disposition: A | Discharge status: Home / Self Care | Payer: Medicare HMO | Source: Ambulatory Visit | Attending: Orthopedic Surgery | Admitting: Orthopedic Surgery

## 2012-03-22 DIAGNOSIS — R269 Unspecified abnormalities of gait and mobility: Secondary | ICD-10-CM | POA: Insufficient documentation

## 2012-03-22 DIAGNOSIS — M545 Low back pain, unspecified: Secondary | ICD-10-CM | POA: Insufficient documentation

## 2012-03-22 DIAGNOSIS — M25569 Pain in unspecified knee: Secondary | ICD-10-CM | POA: Insufficient documentation

## 2012-03-22 DIAGNOSIS — M25559 Pain in unspecified hip: Secondary | ICD-10-CM | POA: Insufficient documentation

## 2012-03-22 DIAGNOSIS — R262 Difficulty in walking, not elsewhere classified: Secondary | ICD-10-CM

## 2012-03-22 DIAGNOSIS — IMO0001 Reserved for inherently not codable concepts without codable children: Secondary | ICD-10-CM | POA: Insufficient documentation

## 2012-03-22 DIAGNOSIS — R29898 Other symptoms and signs involving the musculoskeletal system: Secondary | ICD-10-CM

## 2012-03-22 DIAGNOSIS — M6281 Muscle weakness (generalized): Secondary | ICD-10-CM | POA: Insufficient documentation

## 2012-03-22 NOTE — Evaluation (Addendum)
Physical Therapy Re-evaluation  Patient Details  Name: Megan Odom MRN: [id number] Date of Birth: [birth year]  Today's Date: 03/22/2012 Time: 0803-0848 PT Time Calculation (min): 45 min  Visit#: 8  of 16   Re-eval: 04/21/12 Assessment Diagnosis: lumbar pain, B knee pain  Authorization: [employer] check with Angelica Chessman- on authorization  Past Medical History:  Past Medical History  Diagnosis Date  . Hypertension   . Asthma   . Arthritis   . Hep C w/o coma, chronic    Past Surgical History:  Past Surgical History  Procedure Date  . Cholecystectomy   . Appendectomy   . Abdominal hysterectomy   . Breast surgery     Subjective Symptoms/Limitations Symptoms: Megan Odom states that her legs are tired and my hip is bothering me today.  Pt admitts to not walking at home explained how important it is to walk each day How long can you sit comfortably?: The patient states that she is able to sit for an hour and a half now she was only able to sit for 45 min. How long can you stand comfortably?: The patient states that she can stand for 15-20 minutes now where she was standing 10 minutes. How long can you walk comfortably?: the patient states she can walk for 10-15 min. was less than five Pain Assessment Pain Score:   7 Pain Location: Knee Pain Orientation: Right  Cognition Overall Cognitive Status: Appears within functional limits for tasks assessed  Sensation/Coordination/Flexibility/Functional Tests Functional Tests Functional Tests: LEFS was 16/64; now  35/64  Assessment RLE Strength Right Hip Flexion: 4/5 (was 3/5) Right Hip Extension: 3/5 (was 3-/5) Right Hip ABduction: 3+/5 (3/5) Right Hip ADduction: 3-/5 (was 3-/5) Right Knee Flexion: 3+/5 (3+/5) Right Knee Extension: 4/5 (was 4/5) Right Ankle Dorsiflexion: 5/5 (was 4/5) LLE Strength Left Hip Flexion: 5/5 (was 4/5) Left Hip Extension: 3/5 (was 3-/5) Left Hip ABduction: 3/5 Left Hip ADduction: 3/5 (was  3/5) Left Knee Flexion: 3+/5 (was 3/5) Left Knee Extension: 5/5 (was 4/5) Lumbar AROM Lumbar Flexion: wnl was decreased 30% Lumbar Extension: wnl improved pain Lumbar - Right Side Bend: wfl Lumbar - Left Side Bend: wfl Lumbar - Right Rotation: wfl Lumbar - Left Rotation: wfl  Exercise/Treatments   Aerobic Stationary Bike: 6' @ 2.0 Tread Mill: 1.5 x 5:00 Sidelying Hip Abduction: 15 reps Prone  Single Arm Raise: 10 reps Straight Leg Raise: 10 reps Other Prone Lumbar Exercises: Heel squeeze x 10   Physical Therapy Assessment and Plan PT Assessment and Plan Clinical Impression Statement: Pt improving in stab exercises; pain is slowly coming down; Pt has shown improvement in strength and mobility  BP 124/78 Rehab Potential: Good    Goals Home Exercise Program PT Goal: Perform Home Exercise Program - Progress: Progressing toward goal PT Short Term Goals PT Short Term Goal 1 - Progress: Progressing toward goal PT Short Term Goal 2 - Progress: Progressing toward goal PT Short Term Goal 3 - Progress: Met PT Short Term Goal 4 - Progress: Met PT Long Term Goals PT Long Term Goal 1 - Progress: Progressing toward goal PT Long Term Goal 2: Pt pain is still between a 6-8 PT Long Term Goal 2 - Progress: Not met Long Term Goal 3: Pt is able to sit for an hour Long Term Goal 3 Progress: Met Long Term Goal 4: pt able to walk 15 minutes at a the max Long Term Goal 4 Progress: Progressing toward goal PT Long Term Goal 5: Pt can stand between  15-20 min. at a time. Long Term Goal 5 Progress: Progressing toward goal  Problem List Patient Active Problem List  Diagnosis  . Back pain  . Bilateral anterior knee pain  . Knee pain, left  . Difficulty in walking  . Bilateral leg weakness    PT - End of Session Activity Tolerance: Patient tolerated treatment well General Behavior During Session: [facility] for tasks performed Cognition: [facility] for tasks performed GP G-code based on LEFS  CK   Goal CI  Continue therapy   Megan Odom,CINDY 03/22/2012, 12:13 PM  Physician Documentation Your signature is required to indicate approval of the treatment plan as stated above.  Please sign and either send electronically or make a copy of this report for your files and return this physician signed original.   Please mark one 1.__approve of plan  2. ___approve of plan with the following conditions.   ______________________________                                                          _____________________ Physician Signature                                                                                                             Date

## 2012-03-23 ENCOUNTER — Ambulatory Visit (HOSPITAL_COMMUNITY): Payer: Medicare PPO | Admitting: Physical Therapy

## 2012-03-29 ENCOUNTER — Ambulatory Visit (HOSPITAL_COMMUNITY)
Admission: RE | Admit: 2012-03-29 | Discharge: 2012-03-29 | Disposition: A | Discharge status: Home / Self Care | Payer: Medicare HMO | Source: Ambulatory Visit | Attending: Orthopedic Surgery | Admitting: Orthopedic Surgery

## 2012-03-29 NOTE — Progress Notes (Signed)
Physical Therapy Treatment Patient Details  Name: Megan Odom MRN: [id number] Date of Birth: 1946/02/26  Today's Date: 03/29/2012 Time: 0804-0850 PT Time Calculation (min): 46 min Visit#: 9  of 16   Re-eval: 04/20/12 Authorization: [employer]: checked with Angelica Chessman and has not received authorization yet; states she re-faxed it.  Charges:  therex 39'   USE LUMBAR FLOW SHEET    Subjective: Symptoms/Limitations Symptoms: Pt. states her knees were a little sore this more, bilateral and equal. Pain Assessment Currently in Pain?: Yes Pain Score:   7 Pain Location: Knee Pain Orientation: Right   Exercise/Treatments Stretches Prone on Elbows Stretch: Limitations Prone on Elbows Stretch Limitations: 2 minutes Aerobic Stationary Bike: 6' @ 3.0 UBE (Upper Arm Bike): 4' backward Standing Heel Raises: 15 reps;Limitations Heel Raises Limitations: Toe Raises x15 Functional Squats: 15 reps Functional Squats Limitations: TC's and VC's for proper activation Other Standing Lumbar Exercises: Side stepping w/heel leading 2 RT Other Standing Lumbar Exercises: rockerboard 2 minutes Seated Long Arc Quad on Chair: 15 reps LAQ on Chair Weights (lbs): 5 Supine Bent Knee Raise: 10 reps Bridge: 10 reps Straight Leg Raise: 10 reps Sidelying Hip Abduction: 15 reps Prone  Single Arm Raise: 10 reps Straight Leg Raise: 10 reps Other Prone Lumbar Exercises: Heel squeeze x 10 Other Prone Lumbar Exercises: hamstring curl 15 reps 3#  Manual Therapy Manual Therapy: Massage Massage: To R knee X 5'  Physical Therapy Assessment and Plan PT Assessment and Plan Clinical Impression Statement: Able to increase reps today without diffiuculty; requires constant verbal and tactile cues to perform with stabilization from core muscles/complete activities slowly.  Pt. requested massage of R knee; reported pain reduction to 6/10 after treatment (decreased 1 level). Pt. questioning how to loose weight; stating  she is disgusted with it; educated pt. on a walking program/cardiac activity for weight loss. PT Plan: Continue with core stab and LE strengthening.     Problem List Patient Active Problem List  Diagnosis  . Back pain  . Bilateral anterior knee pain  . Knee pain, left  . Difficulty in walking  . Bilateral leg weakness    PT - End of Session Activity Tolerance: Patient tolerated treatment well General Behavior During Session: [facility] for tasks performed Cognition: [facility] for tasks performed   Lurena Nida, PTA/CLT 03/29/2012, 9:03 AM

## 2012-03-30 ENCOUNTER — Ambulatory Visit (HOSPITAL_COMMUNITY)
Admission: RE | Admit: 2012-03-30 | Discharge: 2012-03-30 | Disposition: A | Discharge status: Home / Self Care | Payer: Medicare HMO | Source: Ambulatory Visit | Attending: Orthopedic Surgery | Admitting: Orthopedic Surgery

## 2012-03-30 NOTE — Progress Notes (Signed)
Physical Therapy Treatment Patient Details  Name: Megan Odom MRN: [id number] Date of Birth: [birth year]  Today's Date: 03/30/2012 Time: 0803-0853 PT Time Calculation (min): 50 min  Visit#: 10  of 16   Re-eval: 04/20/12   Subjective: Symptoms/Limitations Symptoms: Pt states that she was at the pool this morning; states her right knee is really bothering her especially when she goes up and down the step.s. Pertinent History: Pt request BP and HR :  140/80  68      Exercise/Treatments   Aerobic Elliptical: 1:00 UBE (Upper Arm Bike): 4' backward   Standing Heel Raises: 15 reps Functional Squats: 15 reps Seated   Supine Dead Bug: 15 reps Bridge: 15 reps;Limitations Bridge Limitations: with ball squeeze Straight Leg Raise: 15 reps Sidelying Hip Abduction: 10 reps;Weights Hip Abduction Weights (lbs): 3 Prone  Straight Leg Raise: 10 reps Opposite Arm/Leg Raise: 5 reps Other Prone Lumbar Exercises: ham curl w/3#/IR/ER     Physical Therapy Assessment and Plan PT Assessment and Plan PT Frequency: Min 2X/week    Goals    Problem List Patient Active Problem List  Diagnosis  . Back pain  . Bilateral anterior knee pain  . Knee pain, left  . Difficulty in walking  . Bilateral leg weakness    General Behavior During Session: [facility] for tasks performed      RUSSELL,CINDY 03/30/2012, 8:57 AM

## 2012-04-05 ENCOUNTER — Ambulatory Visit (HOSPITAL_COMMUNITY)
Admission: RE | Admit: 2012-04-05 | Discharge: 2012-04-05 | Disposition: A | Discharge status: Home / Self Care | Payer: Medicare HMO | Source: Ambulatory Visit | Attending: Orthopedic Surgery | Admitting: Orthopedic Surgery

## 2012-04-05 DIAGNOSIS — R29898 Other symptoms and signs involving the musculoskeletal system: Secondary | ICD-10-CM

## 2012-04-05 DIAGNOSIS — R262 Difficulty in walking, not elsewhere classified: Secondary | ICD-10-CM

## 2012-04-05 NOTE — Progress Notes (Signed)
Physical Therapy Treatment Patient Details  Name: Megan Odom MRN: [id number] Date of Birth: [birth year]  Today's Date: 04/05/2012 Time: 0758-0900 PT Time Calculation (min): 62 min  Visit#: 11  of 16   Re-eval: 04/20/12  Charge: therex 44', manual 8', ice 10  Authorization: [employer]: checked with Angelica Chessman and has not received authorization yet; states she re-faxed it.   Subjective: Symptoms/Limitations Symptoms: Pt stated R knee really bothering her this morning, pain scale 7/10.  Back is feeling good today.  Pt stated pain increase with stairs.  BP 138/80 and weighs 264.5 lbs today, pt reported lost 2 lbs. Pain Assessment Currently in Pain?: Yes Pain Score:   7 Pain Location: Knee Pain Orientation: Right  Objective:   Exercise/Treatments Aerobic Stationary Bike: 6' @ 3.0 Standing Heel Raises: 15 reps Heel Raises Limitations: Toe Raises x15 Functional Squats: 15 reps;Limitations Functional Squats Limitations: Functional squats lifting orange Seated Long Arc Quad on Chair: 15 reps LAQ on Chair Weights (lbs): 5 LAQ on Chair Limitations: sit tall abs tight Sidelying Clam: 10 reps;Other (comment) Hip Abduction: 15 reps;Weights Hip Abduction Weights (lbs): 3 Prone  Straight Leg Raise: 15 reps Opposite Arm/Leg Raise: 10 reps Other Prone Lumbar Exercises: Multifidus Other Prone Lumbar Exercises: ham curl w/3#/IR/ER  Modalities Modalities: Cryotherapy Manual Therapy Manual Therapy: Joint mobilization Joint Mobilization: Patellar mobs in all directions, Grade II to proximal tibia and fibular head to decrease pain Massage: To R knee x 6' Cryotherapy Number Minutes Cryotherapy: 10 Minutes Cryotherapy Location: Knee Type of Cryotherapy: Ice pack  Physical Therapy Assessment and Plan PT Assessment and Plan Clinical Impression Statement: Changed aerobics back to bike secondary to increased knee pain at beginning of session.  Added new therex activities to address lumbar  and LE weakness, pt required constant verabl and tactile cues for stability with core activities.  Pt stated knee pain reduced at end of session following manual joint mobilization and ice. PT Plan: Continue with core stabilization and LE strengthening.  Resume elliptical or begin treadmill next session at beginning of next  session.    Goals    Problem List Patient Active Problem List  Diagnosis  . Back pain  . Bilateral anterior knee pain  . Knee pain, left  . Difficulty in walking  . Bilateral leg weakness    PT - End of Session Activity Tolerance: Patient tolerated treatment well General Behavior During Session: [facility] for tasks performed Cognition: [facility] for tasks performed  GP    Juel Burrow 04/05/2012, 9:07 AM

## 2012-04-06 ENCOUNTER — Inpatient Hospital Stay (HOSPITAL_COMMUNITY): Admission: RE | Admit: 2012-04-06 | Payer: Medicare HMO | Source: Ambulatory Visit | Admitting: Physical Therapy

## 2012-04-12 ENCOUNTER — Ambulatory Visit (HOSPITAL_COMMUNITY): Payer: Medicare HMO

## 2012-04-12 ENCOUNTER — Ambulatory Visit (HOSPITAL_COMMUNITY)
Admission: RE | Admit: 2012-04-12 | Discharge: 2012-04-12 | Disposition: A | Discharge status: Home / Self Care | Payer: Medicare HMO | Source: Ambulatory Visit | Attending: Orthopedic Surgery | Admitting: Orthopedic Surgery

## 2012-04-12 DIAGNOSIS — R262 Difficulty in walking, not elsewhere classified: Secondary | ICD-10-CM

## 2012-04-12 DIAGNOSIS — R29898 Other symptoms and signs involving the musculoskeletal system: Secondary | ICD-10-CM

## 2012-04-12 NOTE — Progress Notes (Signed)
Physical Therapy Treatment Patient Details  Name: Megan Odom MRN: [id number] Date of Birth: March 16, 1946  Today's Date: 04/12/2012 Time: 0800-0856 PT Time Calculation (min): 56 min  Visit#: 12  of 16   Re-eval: 04/20/12  Charge: therex 38, manual 8'. ice 10'  Authorization: approved from 03/16/2012-04/30/2012   Subjective: Symptoms/Limitations Symptoms: "R knee hurts when I bend it" pain scale 6/10 today. Pain Assessment Currently in Pain?: Yes Pain Score:   6 Pain Location: Knee Pain Orientation: Right  Objective:   Exercise/Treatments Aerobic Elliptical: 4' @ L1 Standing Heel Raises: 15 reps Heel Raises Limitations: Toe Raises x15 Functional Squats: 15 reps;Limitations Functional Squats Limitations: proper lifting orange ball Seated Long Arc Quad on Chair: 15 reps LAQ on Chair Weights (lbs): 5 LAQ on Chair Limitations: sit tall abs tight Supine Dead Bug: 15 reps Bridge: 15 reps;Limitations Bridge Limitations: with ball squeeze Straight Leg Raise: 15 reps Sidelying Clam: 10 reps;Other (comment) Hip Abduction: 15 reps;Weights Hip Abduction Weights (lbs): 3  Modalities Modalities: Cryotherapy Manual Therapy Manual Therapy: Joint mobilization Joint Mobilization: Patellar mobs in all directions, Grade II to proximal tibia and fibular head to decrease pain Cryotherapy Number Minutes Cryotherapy: 10 Minutes Cryotherapy Location: Knee Type of Cryotherapy: Ice pack  Physical Therapy Assessment and Plan PT Assessment and Plan Clinical Impression Statement: Pt wtih decreased activity tolerance, reumed elliptical with increased fatigue.  Pt able to complete therex with multimodal cueing for proper form, technique and for core activation with activity.  Able to reduce knee pain with manual and ice at end of session. PT Plan: Continue with core stabilization and LE strengthening.    Goals    Problem List Patient Active Problem List  Diagnosis  . Back pain  .  Bilateral anterior knee pain  . Knee pain, left  . Difficulty in walking  . Bilateral leg weakness    PT - End of Session Activity Tolerance: Patient tolerated treatment well General Behavior During Session: [facility] for tasks performed Cognition: [facility] for tasks performed  GP    Juel Burrow 04/12/2012, 9:31 AM

## 2012-04-13 ENCOUNTER — Ambulatory Visit (HOSPITAL_COMMUNITY)
Admission: RE | Admit: 2012-04-13 | Discharge: 2012-04-13 | Disposition: A | Discharge status: Home / Self Care | Payer: Medicare HMO | Source: Ambulatory Visit | Attending: Internal Medicine | Admitting: Internal Medicine

## 2012-04-13 ENCOUNTER — Ambulatory Visit (HOSPITAL_COMMUNITY): Payer: Medicare HMO | Admitting: Physical Therapy

## 2012-04-13 DIAGNOSIS — R262 Difficulty in walking, not elsewhere classified: Secondary | ICD-10-CM

## 2012-04-13 DIAGNOSIS — R29898 Other symptoms and signs involving the musculoskeletal system: Secondary | ICD-10-CM

## 2012-04-13 NOTE — Progress Notes (Signed)
Physical Therapy Treatment Patient Details  Name: Megan Odom MRN: [id number] Date of Birth: Dec 08, 1945  Today's Date: 04/13/2012 Time: 1308-6578 PT Time Calculation (min): 60 min  Visit#: 13  of 16   Re-eval: 04/20/12  Charge: therex 38 Manual 8 Ice 10  Authorization: approved from 03/16/2012-04/30/2012   Subjective: Symptoms/Limitations Symptoms: "Knees feeling better today though it still hurts when I bend it", pain scale 5/10 today.  Backs feeling good today.  Pt reported going to the [facility] this morning and completeing water aerobics. Pain Assessment Currently in Pain?: Yes Pain Location: Knee Pain Orientation: Right  Objective:   Exercise/Treatments Aerobic Elliptical: 4' @ L1 UBE (Upper Arm Bike): 5' @ 5.0 backwards Standing Heel Raises: 15 reps Heel Raises Limitations: Toe Raises x15 Functional Squats: 15 reps;Limitations Functional Squats Limitations: proper lifting orange ball Scapular Retraction: 15 reps;Theraband Theraband Level (Scapular Retraction): Level 3 (Green) Row: 15 reps;Theraband Theraband Level (Row): Level 3 (Green) Shoulder Extension: 15 reps;Theraband Theraband Level (Shoulder Extension): Level 3 (Green) Seated Other Seated Lumbar Exercises: Cybex h/s 3 PL 15 reps Other Seated Lumbar Exercises: Cybex quad 1.5 Pl 15 reps Prone  Opposite Arm/Leg Raise: 10 reps Other Prone Lumbar Exercises: Multifidus 15x 10" holds following verbal/tactile cueing  Modalities Modalities: Cryotherapy Manual Therapy Joint Mobilization: Patellar mobs in all directions, Grade II to proximal tibia and fibular head to decrease pain Cryotherapy Number Minutes Cryotherapy: 10 Minutes Cryotherapy Location: Knee Type of Cryotherapy: Ice pack  Physical Therapy Assessment and Plan PT Assessment and Plan Clinical Impression Statement: LE strength improving, able to progress to cybex quad and hamstring machines with cueing for control.  Resumed theraband for postural  strengthening with multimodal cuieng required for form and technique.  Able to reduce pain with manual joint mobs and ice at the end of session.  Pt expressed concern with her balance, would like to perform balance activities next session. PT Plan: Continue with current POC for LE strengthening and core stabilization.  Next session begin rocker board, tandem stance/gait and SLS.    Goals    Problem List Patient Active Problem List  Diagnosis  . Back pain  . Bilateral anterior knee pain  . Knee pain, left  . Difficulty in walking  . Bilateral leg weakness    PT - End of Session Activity Tolerance: Patient tolerated treatment well General Behavior During Session: [facility] for tasks performed Cognition: [facility] for tasks performed  GP    Juel Burrow 04/13/2012, 9:29 AM

## 2012-04-18 ENCOUNTER — Ambulatory Visit (HOSPITAL_COMMUNITY): Payer: Medicare HMO

## 2012-04-19 ENCOUNTER — Ambulatory Visit (HOSPITAL_COMMUNITY): Payer: Medicare HMO

## 2012-04-19 ENCOUNTER — Inpatient Hospital Stay (HOSPITAL_COMMUNITY): Admission: RE | Admit: 2012-04-19 | Payer: Medicare HMO | Source: Ambulatory Visit | Admitting: Physical Therapy

## 2012-04-20 ENCOUNTER — Ambulatory Visit (HOSPITAL_COMMUNITY)
Admission: RE | Admit: 2012-04-20 | Discharge: 2012-04-20 | Disposition: A | Discharge status: Home / Self Care | Payer: Medicare HMO | Source: Ambulatory Visit | Attending: Orthopedic Surgery | Admitting: Orthopedic Surgery

## 2012-04-20 ENCOUNTER — Ambulatory Visit (HOSPITAL_COMMUNITY): Payer: Medicare HMO | Admitting: Physical Therapy

## 2012-04-20 DIAGNOSIS — R29898 Other symptoms and signs involving the musculoskeletal system: Secondary | ICD-10-CM

## 2012-04-20 DIAGNOSIS — R262 Difficulty in walking, not elsewhere classified: Secondary | ICD-10-CM

## 2012-04-20 NOTE — Evaluation (Signed)
Physical Therapy Evaluation  Patient Details  Name: Megan Odom MRN: [id number] Date of Birth: 27-Jan-1946  Today's Date: 04/20/2012 Time: 0800-0930 PT Time Calculation (min): 90 min  Visit#: 14  of 15   Re-eval: 04/27/12 Assessment Diagnosis: lumbar pain, B knee pain  Authorization: approved from 7/26 thrrough 9/9  Authorization Time Period:    Authorization Visit#:   of     Past Medical History:  Past Medical History  Diagnosis Date  . Hypertension   . Asthma   . Arthritis   . Hep C w/o coma, chronic    Past Surgical History:  Past Surgical History  Procedure Date  . Cholecystectomy   . Appendectomy   . Abdominal hysterectomy   . Breast surgery     Subjective Symptoms/Limitations Symptoms: Pt states she felt terrible yesterday and did not get out of bed.  Pt states that she is feeling pain going down her right arm to her finger tips.   Pertinent History: BP 110/70 How long can you sit comfortably?: The patient states that she is able to sit for an hour was and hour and a half due to her knee feeling stiff. How long can you stand comfortably?: The patient is able to stand for 15 minutes was 15 minutes. How long can you walk comfortably?: Th patient states that she can walk for ten minutes was ten minutes. Pain Assessment Currently in Pain?: Yes Pain Score: 10-Worst pain ever (my knee is a 10/10 backi 6/10 ; arm 8/10)  Precautions/Restrictions     Prior Functioning     Cognition/Observation Cognition Overall Cognitive Status: Appears within functional limits for tasks assessed  Sensation/Coordination/Flexibility/Functional Tests Functional Tests Functional Tests: LEFS was  35/ now 28/64  Assessment RLE Strength Right Hip Flexion: 4/5 (was 3/5) Right Hip Extension: 3+/5 (was 3/5) Right Hip ABduction: 3+/5 (was 3+) Right Hip ADduction: 3-/5 (was 3-/5) Right Knee Flexion: 3+/5 (3+/5) Right Knee Extension:  (4+ was 4+/5) Right Ankle Dorsiflexion:  5/5 (was 4/5) LLE Strength Left Hip Flexion: 5/5 (was 4/5) Left Hip Extension: 3+/5 (was 3+/5) Left Hip ABduction: 4/5 (was 3/5) Left Hip ADduction: 4/5 (was 3/5) Left Knee Flexion: 4/5 (was 3+/5 ) Left Knee Extension: 5/5 (was 4/5) Lumbar AROM Lumbar Flexion: wnl was decreased 30% Lumbar Extension: wnl improved pain Lumbar - Right Side Bend: wfl Lumbar - Left Side Bend: wfl Lumbar - Right Rotation: wfl Lumbar - Left Rotation: wfl  Exercise/Treatments   Aerobic Stationary Bike: 6' @ 3.0 Machines for Strengthening   Standing Heel Raises: 15 reps Functional Squats: 15 reps Supine Ab Set: 10 reps Glut Set: 10 reps Bridge: 15 reps Bridge Limitations: with ball squeeze Straight Leg Raise: 5 reps Other Supine Lumbar Exercises: decompressive ex 1-5/ T-band decompressive ex. Prone SLR x 5   Cryotherapy Number Minutes Cryotherapy: 10 Minutes Cryotherapy Location: Knee Type of Cryotherapy: Ice pack  Physical Therapy Assessment and Plan PT Assessment and Plan Clinical Impression Statement: Pt continues to improve in strength but c/o of knee pain. Will attempt pelvic traction for two visits to determine if this helps radicular pain.  Questionable whether knee pain is from OA of the knee or radicular pain from the back. Rehab Potential: Good PT Frequency: Min 2X/week PT Duration:  (one more week) PT Plan: Try pelvic traction max 75/ min 50 x 15 min to attempt to decrease knee pain if caused from radicular sx;  if no improvement after 2 sessions D/C if improved request more visits from insurance.  Goals Home Exercise Program PT Goal: Perform Home Exercise Program - Progress: Met PT Short Term Goals PT Short Term Goal 1 - Progress: Met PT Short Term Goal 2 - Progress: Not met PT Short Term Goal 3 - Progress: Met PT Short Term Goal 4 - Progress: Progressing toward goal PT Long Term Goals PT Long Term Goal 1 - Progress: Met PT Long Term Goal 2: Pain continutes to be high  back is a 6 but knee is an 8;  possible trial of lumbar traction. Long Term Goal 3 Progress: Met Long Term Goal 4: Pt continute to only be able to walk for 10-15 minutes at a time. Long Term Goal 4 Progress: Progressing toward goal PT Long Term Goal 5: Pt continues to be only be able to stand for 15-20 minutes at a time. Long Term Goal 5 Progress: Progressing toward goal  Problem List Patient Active Problem List  Diagnosis  . Back pain  . Bilateral anterior knee pain  . Knee pain, left  . Difficulty in walking  . Bilateral leg weakness    PT - End of Session Activity Tolerance: Patient tolerated treatment well General Behavior During Session: [facility] for tasks performed Cognition: [facility] for tasks performed PT Plan of Care PT Home Exercise Plan: given new decompression exercises.  GP    RUSSELL,CINDY 04/20/2012, 9:45 AM  Physician Documentation Your signature is required to indicate approval of the treatment plan as stated above.  Please sign and either send electronically or make a copy of this report for your files and return this physician signed original.   Please mark one 1.__approve of plan  2. ___approve of plan with the following conditions.   ______________________________                                                          _____________________ Physician Signature                                                                                                             Date

## 2012-04-24 ENCOUNTER — Emergency Department (HOSPITAL_COMMUNITY)
Admission: EM | Admit: 2012-04-24 | Discharge: 2012-04-24 | Disposition: A | Discharge status: Home / Self Care | Payer: Medicare HMO | Attending: Emergency Medicine | Admitting: Emergency Medicine

## 2012-04-24 ENCOUNTER — Emergency Department (HOSPITAL_COMMUNITY): Payer: Medicare HMO

## 2012-04-24 ENCOUNTER — Encounter (HOSPITAL_COMMUNITY): Payer: Self-pay

## 2012-04-24 DIAGNOSIS — R5383 Other fatigue: Secondary | ICD-10-CM | POA: Insufficient documentation

## 2012-04-24 DIAGNOSIS — R079 Chest pain, unspecified: Secondary | ICD-10-CM

## 2012-04-24 DIAGNOSIS — Z8619 Personal history of other infectious and parasitic diseases: Secondary | ICD-10-CM | POA: Insufficient documentation

## 2012-04-24 DIAGNOSIS — I10 Essential (primary) hypertension: Secondary | ICD-10-CM | POA: Insufficient documentation

## 2012-04-24 DIAGNOSIS — Z79899 Other long term (current) drug therapy: Secondary | ICD-10-CM | POA: Insufficient documentation

## 2012-04-24 DIAGNOSIS — H539 Unspecified visual disturbance: Secondary | ICD-10-CM | POA: Insufficient documentation

## 2012-04-24 DIAGNOSIS — R5381 Other malaise: Secondary | ICD-10-CM | POA: Insufficient documentation

## 2012-04-24 DIAGNOSIS — R51 Headache: Secondary | ICD-10-CM

## 2012-04-24 LAB — CBC WITH DIFFERENTIAL/PLATELET
Basophils Absolute: 0 10*3/uL (ref 0.0–0.1)
Basophils Relative: 0 % (ref 0–1)
Eosinophils Absolute: 0.2 10*3/uL (ref 0.0–0.7)
Eosinophils Relative: 2 % (ref 0–5)
HCT: 41.2 % (ref 36.0–46.0)
Hemoglobin: 13.5 g/dL (ref 12.0–15.0)
Lymphocytes Relative: 39 % (ref 12–46)
Lymphs Abs: 3.8 10*3/uL (ref 0.7–4.0)
MCH: 29.9 pg (ref 26.0–34.0)
MCHC: 32.8 g/dL (ref 30.0–36.0)
MCV: 91.2 fL (ref 78.0–100.0)
Monocytes Absolute: 0.7 10*3/uL (ref 0.1–1.0)
Monocytes Relative: 7 % (ref 3–12)
Neutro Abs: 4.9 10*3/uL (ref 1.7–7.7)
Neutrophils Relative %: 52 % (ref 43–77)
Platelets: 256 10*3/uL (ref 150–400)
RBC: 4.52 MIL/uL (ref 3.87–5.11)
RDW: 12.2 % (ref 11.5–15.5)
WBC: 9.6 10*3/uL (ref 4.0–10.5)

## 2012-04-24 LAB — BASIC METABOLIC PANEL
BUN: 10 mg/dL (ref 6–23)
CO2: 26 mEq/L (ref 19–32)
Calcium: 9.3 mg/dL (ref 8.4–10.5)
Chloride: 103 mEq/L (ref 96–112)
Creatinine, Ser: 0.79 mg/dL (ref 0.50–1.10)
GFR calc Af Amer: >90 mL/min (ref >90)
GFR calc non Af Amer: 86 mL/min — ABNORMAL LOW (ref >90)
Glucose, Bld: 106 mg/dL — ABNORMAL HIGH (ref 70–99)
Potassium: 4.2 mEq/L (ref 3.5–5.1)
Sodium: 136 mEq/L (ref 135–145)

## 2012-04-24 LAB — TROPONIN I: Troponin I: <0.3 ng/mL (ref <0.30)

## 2012-04-24 MED ORDER — TRAMADOL HCL 50 MG PO TABS
50.0000 mg | ORAL_TABLET | Freq: Once | ORAL | Status: AC
  Administered 2012-04-24: 50 mg via ORAL
  Filled 2012-04-24 (×2): qty 1

## 2012-04-24 MED ORDER — TRAMADOL HCL 50 MG PO TABS: 50.0000 mg | ORAL_TABLET | Freq: Four times a day (QID) | ORAL | Status: AC | PRN

## 2012-04-24 NOTE — ED Notes (Signed)
Pt reports right sided weakness and pain x 6 months.  Says has been having PT to help strengthen r side.  Pt also reports right sided  chest pain since last night that radiates down r arm.    ALso c/o headache.

## 2012-04-24 NOTE — ED Provider Notes (Signed)
History     CSN: [id number]  Arrival date & time 04/24/12  1237   First MD Initiated Contact with Patient 04/24/12 1356      Chief Complaint  Patient presents with  . Headache  . Chest Pain  . Weakness    (Consider location/radiation/quality/duration/timing/severity/associated sxs/prior treatment) Patient is a 66 y.o. female presenting with headaches, chest pain, and weakness. The history is provided by the patient.  Headache  This is a new problem. The current episode started yesterday. The problem occurs constantly. The pain is located in the right unilateral region. The quality of the pain is described as sharp. The pain is at a severity of 8/10. The pain is moderate. The pain does not radiate. Associated symptoms include chest pressure and nausea. Pertinent negatives include no fever, no near-syncope, no syncope, no shortness of breath and no vomiting. She has tried nothing for the symptoms.  Chest Pain Primary symptoms include nausea. Pertinent negatives for primary symptoms include no fever, no syncope, no shortness of breath, no cough and no vomiting.  Associated symptoms include weakness.  Pertinent negatives for associated symptoms include no near-syncope.    Weakness The primary symptoms include headaches and nausea. Primary symptoms do not include syncope, fever or vomiting.  The headache is associated with weakness.  Additional symptoms include weakness.    Patient will Friday complaints include right sided weakness and pain for 6 months now has some right-sided chest pain started last night at 7:00 and a headache is mostly on the right side that started last night at 7:00. Patient also reports right-sided chest pain as mentioned radiates down the right arm. The chest pain is rated as an 8/10 radiates as stated. Pain scribe sharp.  Past Medical History  Diagnosis Date  . Hypertension   . Asthma   . Arthritis   . Hep C w/o coma, chronic     Past Surgical History    Procedure Date  . Cholecystectomy   . Appendectomy   . Abdominal hysterectomy   . Breast surgery     Family History  Problem Relation Age of Onset  . Arthritis    . Cancer    . Asthma    . Diabetes      History  Substance Use Topics  . Smoking status: Current Everyday Smoker  . Smokeless tobacco: Not on file  . Alcohol Use: No    OB History    Grav Para Term Preterm Abortions TAB SAB Ect Mult Living                  Review of Systems  Constitutional: Negative for fever.  HENT: Negative for neck pain.   Eyes: Positive for visual disturbance.  Respiratory: Negative for cough and shortness of breath.   Cardiovascular: Positive for chest pain. Negative for syncope and near-syncope.  Gastrointestinal: Positive for nausea. Negative for vomiting.  Genitourinary: Negative for dysuria.  Musculoskeletal: Negative for back pain.  Skin: Negative for rash.  Neurological: Positive for weakness and headaches.  Hematological: Does not bruise/bleed easily.    Allergies  Darvocet; Latex; Penicillins; and Tetracyclines & related  Home Medications   Current Outpatient Rx  Name Route Sig Dispense Refill  . ALBUTEROL SULFATE HFA 108 (90 BASE) MCG/ACT IN AERS Inhalation Inhale 2 puffs into the lungs every 6 (six) hours as needed. Shortness of breath     . ALBUTEROL SULFATE (2.5 MG/3ML) 0.083% IN NEBU Nebulization Take 2.5 mg by nebulization every 6 (six) hours as  needed. For shortness of breath    . AMITRIPTYLINE HCL 10 MG PO TABS Oral Take 10 mg by mouth at bedtime.      Marland Kitchen AMLODIPINE BESYLATE 5 MG PO TABS Oral Take 5 mg by mouth daily.      . ASPIRIN EC 81 MG PO TBEC Oral Take 81 mg by mouth daily.      . ASPIRIN-ACETAMINOPHEN-CAFFEINE 250-250-65 MG PO TABS Oral Take 1 tablet by mouth as needed. For headache pain    . BECLOMETHASONE DIPROPIONATE 40 MCG/ACT IN AERS Inhalation Inhale 2 puffs into the lungs 2 (two) times daily.      Marland Kitchen SLEEP AID PO Oral Take 1 tablet by mouth at  bedtime as needed. Sleep    . FLUTICASONE PROPIONATE 50 MCG/ACT NA SUSP Nasal Place 2 sprays into the nose daily.      . FUROSEMIDE 40 MG PO TABS Oral Take 40 mg by mouth daily.      . IBUPROFEN 800 MG PO TABS Oral Take 800 mg by mouth every 8 (eight) hours as needed. Pain      . LORATADINE 10 MG PO TABS Oral Take 5 mg by mouth daily.      Marland Kitchen OMEPRAZOLE 40 MG PO CPDR Oral Take 40 mg by mouth 2 (two) times daily.      Marland Kitchen PAROXETINE HCL 20 MG PO TABS Oral Take 20 mg by mouth 2 (two) times daily.     Marland Kitchen POTASSIUM CHLORIDE CRYS ER 20 MEQ PO TBCR Oral Take 20 mEq by mouth daily.     . TRAMADOL-ACETAMINOPHEN 37.5-325 MG PO TABS Oral Take 1 tablet by mouth every 6 (six) hours as needed. For pain    . VALSARTAN-HYDROCHLOROTHIAZIDE 160-12.5 MG PO TABS Oral Take 1 tablet by mouth daily.      . TRAMADOL HCL 50 MG PO TABS Oral Take 1 tablet (50 mg total) by mouth every 6 (six) hours as needed for pain. 15 tablet 0    BP 143/70  Pulse 67  Temp 98.1 F (36.7 C) (Oral)  Resp 22  Ht 5\' 5"  (1.651 m)  Wt 264 lb (119.75 kg)  BMI 43.93 kg/m2  SpO2 95%  Physical Exam  Nursing note and vitals reviewed. Constitutional: She is oriented to person, place, and time. She appears well-developed and well-nourished.  HENT:  Head: Normocephalic and atraumatic.  Mouth/Throat: Oropharynx is clear and moist.  Eyes: Conjunctivae and EOM are normal. Pupils are equal, round, and reactive to light.  Neck: Normal range of motion. Neck supple.  Cardiovascular: Normal rate and regular rhythm.   No murmur heard. Pulmonary/Chest: Effort normal and breath sounds normal.  Abdominal: Soft. Bowel sounds are normal. There is no tenderness.  Musculoskeletal: Normal range of motion. She exhibits no tenderness.  Neurological: She is alert and oriented to person, place, and time. No cranial nerve deficit. She exhibits normal muscle tone. Coordination normal.  Skin: Skin is warm. No rash noted.    ED Course  Procedures (including  critical care time)  Labs Reviewed  BASIC METABOLIC PANEL - Abnormal; Notable for the following:    Glucose, Bld 106 (*)     GFR calc non Af Amer 86 (*)     All other components within normal limits  CBC WITH DIFFERENTIAL  TROPONIN I   Ct Head Wo Contrast  04/24/2012  *RADIOLOGY REPORT*  Clinical Data: Headache.  Right-sided weakness and pain.  CT HEAD WITHOUT CONTRAST  Technique:  Contiguous axial images were obtained from  the base of the skull through the vertex without contrast.  Comparison: 02/26/2011.  Findings: No mass lesion, mass effect, midline shift, hydrocephalus, hemorrhage.  No territorial ischemia or acute infarction.  Intracranial atherosclerosis.  Mastoid air cells clear.  Left-sided nasal septal spur incidentally noted. 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  IMPRESSION:  1.  Negative CT brain. 2.  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   Original Report Authenticated By: Andreas Newport, M.D.    Dg Chest Portable 1 View  04/24/2012  *RADIOLOGY REPORT*  Clinical Data: Right-sided chest pain.  PORTABLE CHEST - 1 VIEW  Comparison: 10/03/2011.  Findings:  Cardiopericardial silhouette within normal limits. Mediastinal contours normal. Trachea midline.  No airspace disease or effusion. Monitoring leads are projected over the chest.  IMPRESSION: No active cardiopulmonary disease.   Original Report Authenticated By: Andreas Newport, M.D.    Results for orders placed during the hospital encounter of 04/24/12  CBC WITH DIFFERENTIAL      Component Value Range   WBC 9.6  4.0 - 10.5 K/uL   RBC 4.52  3.87 - 5.11 MIL/uL   Hemoglobin 13.5  12.0 - 15.0 g/dL   HCT 16.1  09.6 - 04.5 %   MCV 91.2  78.0 - 100.0 fL   MCH 29.9  26.0 - 34.0 pg   MCHC 32.8  30.0 - 36.0 g/dL   RDW 40.9  81.1 - 91.4 %   Platelets 256  150 - 400 K/uL   Neutrophils Relative  52  43 - 77 %   Neutro Abs 4.9  1.7 - 7.7 K/uL   Lymphocytes Relative 39  12 - 46 %   Lymphs Abs 3.8  0.7 - 4.0 K/uL   Monocytes Relative 7  3 - 12 %   Monocytes Absolute 0.7  0.1 - 1.0 K/uL   Eosinophils Relative 2  0 - 5 %   Eosinophils Absolute 0.2  0.0 - 0.7 K/uL   Basophils Relative 0  0 - 1 %   Basophils Absolute 0.0  0.0 - 0.1 K/uL  BASIC METABOLIC PANEL      Component Value Range   Sodium 136  135 - 145 mEq/L   Potassium 4.2  3.5 - 5.1 mEq/L   Chloride 103  96 - 112 mEq/L   CO2 26  19 - 32 mEq/L   Glucose, Bld 106 (*) 70 - 99 mg/dL   BUN 10  6 - 23 mg/dL   Creatinine, Ser 7.82  0.50 - 1.10 mg/dL   Calcium 9.3  8.4 - 95.6 mg/dL   GFR calc non Af Amer 86 (*) >90 mL/min   GFR calc Af Amer >90  >90 mL/min  TROPONIN I      Component Value Range   Troponin I <0.30  <0.30 ng/mL    Date: 04/24/2012  Rate: 65  Rhythm: normal sinus rhythm  QRS Axis: normal  Intervals: normal  ST/T Wave abnormalities: nonspecific ST changes  Conduction Disutrbances:left anterior fascicular block  Narrative Interpretation:   Old EKG Reviewed: unchanged  From  07/09/11  1. Headache   2. Chest pain       MDM   Patient with complaint of headache and visual change in the right 9 since yesterday. Also with right-sided chest pain and the right-sided discomfort in the extremities for the past 6 months. The chest pain started last night is 7 PM. Workup  here today troponins negative EKG without any acute changes no leukocytosis electrolytes are normal head CT showed no abnormalities. We'll have patient follow back up with Dr. Felecia Shelling give her referral to neurology. Patient's headache and pain improved with a UlTRAM prescription provided. Take at home she's had that in the past.        Shelda Jakes, MD 04/24/12 [phone]

## 2012-04-25 ENCOUNTER — Ambulatory Visit (HOSPITAL_COMMUNITY): Payer: Medicare HMO | Admitting: *Deleted

## 2012-04-26 ENCOUNTER — Ambulatory Visit (HOSPITAL_COMMUNITY): Payer: Medicare HMO | Admitting: Physical Therapy

## 2012-04-26 ENCOUNTER — Ambulatory Visit (HOSPITAL_COMMUNITY)
Admission: RE | Admit: 2012-04-26 | Discharge: 2012-04-26 | Disposition: A | Discharge status: Home / Self Care | Payer: Medicare HMO | Source: Ambulatory Visit | Attending: Internal Medicine | Admitting: Internal Medicine

## 2012-04-26 DIAGNOSIS — R262 Difficulty in walking, not elsewhere classified: Secondary | ICD-10-CM

## 2012-04-26 DIAGNOSIS — M25569 Pain in unspecified knee: Secondary | ICD-10-CM | POA: Insufficient documentation

## 2012-04-26 DIAGNOSIS — R269 Unspecified abnormalities of gait and mobility: Secondary | ICD-10-CM | POA: Insufficient documentation

## 2012-04-26 DIAGNOSIS — R29898 Other symptoms and signs involving the musculoskeletal system: Secondary | ICD-10-CM

## 2012-04-26 DIAGNOSIS — M6281 Muscle weakness (generalized): Secondary | ICD-10-CM | POA: Insufficient documentation

## 2012-04-26 DIAGNOSIS — M545 Low back pain, unspecified: Secondary | ICD-10-CM | POA: Insufficient documentation

## 2012-04-26 DIAGNOSIS — M25559 Pain in unspecified hip: Secondary | ICD-10-CM | POA: Insufficient documentation

## 2012-04-26 DIAGNOSIS — IMO0001 Reserved for inherently not codable concepts without codable children: Secondary | ICD-10-CM | POA: Insufficient documentation

## 2012-04-26 NOTE — Progress Notes (Signed)
Physical Therapy Treatment Patient Details  Name: Megan Odom MRN: [id number] Date of Birth: 04-13-1946  Today's Date: 04/26/2012 Time: 0810-0855 PT Time Calculation (min): 45 min  Visit#: 15  of 16   Re-eval: 04/27/12    Authorization:   [employer] medicare Authorization Time Period:    Authorization Visit#:   of     Subjective: Symptoms/Limitations Symptoms: I was in the ER I thought I was having ministrokes.  My back is feeling much better.  Pt states her legs do not seem to get as tired as easily now.; Pain Assessment Pain Score:   4 Pain Location: Back Pain Radiating Towards: to knee;  pain is at a 6 pt states that she feels as if there is something in her knee.   Precautions/Restrictions     Exercise/Treatments      Supine Other Supine Lumbar Exercises: decompression ex  Sidelying   Prone  Single Arm Raise: 10 reps Straight Leg Raise: 10 reps Opposite Arm/Leg Raise: 10 reps Other Prone Lumbar Exercises: heelsqueeze x 15.    Modalities Modalities: Traction Traction Type of Traction: Lumbar Min (lbs): 50 Max (lbs): 75 Hold Time: 60 Rest Time: 20 Time: 15  Physical Therapy Assessment and Plan PT Assessment and Plan Clinical Impression Statement: Pt states back pain iimproving with improved functional tolerance of activity.  Attempted pelvic traction for the first time.  Pt authorized through next Tuesday, however pt can only come Thurs. and Fri due to work schedule.  If traction seems to help Pt will need to be reauthorized for further visits next visit. [employer] PT Frequency: Min 2X/week PT Plan: assess how traction felt.  If traction did will increase to 100# max; 75 min next treatment.  Reauthorize visits if traction is appearing to benefit the patient.    Problem List Patient Active Problem List  Diagnosis  . Back pain  . Bilateral anterior knee pain  . Knee pain, left  . Difficulty in walking  . Bilateral leg weakness    PT - End of  Session Activity Tolerance: Patient tolerated treatment well General Behavior During Session: [facility] for tasks performed Cognition: [facility] for tasks performed PT Plan of Care PT Home Exercise Plan: Pt has not tried decompression exercises at home.  Reviewed decompression ex and encouraged pt to complete them at home  Consulted and Agree with Plan of Care: Patient  GP    Megan Odom,Megan Odom 04/26/2012, 8:52 AM

## 2012-04-27 ENCOUNTER — Ambulatory Visit (HOSPITAL_COMMUNITY)
Admission: RE | Admit: 2012-04-27 | Discharge: 2012-04-27 | Disposition: A | Discharge status: Home / Self Care | Payer: Medicare HMO | Source: Ambulatory Visit | Attending: Internal Medicine | Admitting: Internal Medicine

## 2012-04-27 ENCOUNTER — Ambulatory Visit (HOSPITAL_COMMUNITY): Payer: Medicare HMO | Admitting: Physical Therapy

## 2012-04-27 DIAGNOSIS — R29898 Other symptoms and signs involving the musculoskeletal system: Secondary | ICD-10-CM

## 2012-04-27 DIAGNOSIS — R262 Difficulty in walking, not elsewhere classified: Secondary | ICD-10-CM

## 2012-04-27 NOTE — Evaluation (Signed)
hysical Therapy Evaluation  Patient Details  Name: Megan Odom MRN: [id number] Date of Birth: [birth year]  Today's Date: 04/27/2012 Time: 1191-4782 PT Time Calculation (min): 53 min  Visit#:  16 of   16 Re-eval: 05/27/12 Assessment Diagnosis: lumbar pain, B knee pain  Authorization: put in new authorization to be good  until 10/8-  has not returned sent today check with Angelica Chessman      Past Medical History:  Past Medical History  Diagnosis Date  . Hypertension   . Asthma   . Arthritis   . Hep C w/o coma, chronic    Past Surgical History:  Past Surgical History  Procedure Date  . Cholecystectomy   . Appendectomy   . Abdominal hysterectomy   . Breast surgery     Subjective Symptoms/Limitations Symptoms:  Pt states that the traction felt good.  States she had quite a bit of pain relief Pain Assessment Currently in Pain?: Yes Pain Score:   2 Pain Location: Back   Cognition/Observation Cognition Overall Cognitive Status: Appears within functional limits for tasks assessed  Sensation/Coordination/Flexibility/Functional Tests Functional Tests Functional Tests: LEFS was  35/ now 28/64  Assessment RLE Strength Right Hip Flexion: 4/5 (was 3/5) Right Hip Extension: 3+/5 (was 3/5) Right Hip ABduction: 3+/5 (was 3+) Right Hip ADduction: 3-/5 (was 3-/5) Right Knee Flexion: 3+/5 (3+/5) Right Knee Extension:  (4+ was 4+/5) Right Ankle Dorsiflexion: 5/5 (was 4/5) LLE Strength Left Hip Flexion: 5/5 (was 4/5) Left Hip Extension: 3+/5 (was 3+/5) Left Hip ABduction: 4/5 (was 3/5) Left Hip ADduction: 4/5 (was 3/5) Left Knee Flexion: 4/5 (was 3+/5 ) Left Knee Extension: 5/5 (was 4/5) Lumbar AROM Lumbar Flexion: wnl was decreased 30% Lumbar Extension: wnl improved pain Lumbar - Right Side Bend: wfl Lumbar - Left Side Bend: wfl Lumbar - Right Rotation: wfl Lumbar - Left Rotation: wfl  Exercise/Treatments     Standing Heel Raises: 15 reps;Other (comment) Heel  Raises Limitations: reach arms up as to get into the cabinet Lifting: From 12";15 reps Forward Lunge: 15 reps   Prone  Single Arm Raise: 15 reps;Limitations Single Arm Raises Limitations: double  Straight Leg Raise: 15 reps Opposite Arm/Leg Raise: 15 reps Other Prone Lumbar Exercises:  (heelsqueeze x 15)     Modalities Modalities: Traction Traction Type of Traction: Lumbar Min (lbs): 75 Max (lbs): 100 Hold Time: 60 Rest Time: 20 Time: 15  Physical Therapy Assessment and Plan PT Assessment and Plan Clinical Impression Statement: Pt with good pain relief from traction.  Will continue if insurance okays;  working on more functional activities. PT Frequency: Min 2X/week PT Duration: 4 weeks PT Plan: continue to see pt if okayed with insurance    Goals    Problem List Patient Active Problem List  Diagnosis  . Back pain  . Bilateral anterior knee pain  . Knee pain, left  . Difficulty in walking  . Bilateral leg weakness    PT - End of Session Activity Tolerance: Patient tolerated treatment well General Behavior During Session: [facility] for tasks performed Cognition: [facility] for tasks performed PT Plan of Care Consulted and Agree with Plan of Care: Patient  GP Functional Assessment Tool Used: based on oswestry Functional Limitation: Mobility: Walking and moving around Mobility: Walking and Moving Around Current Status ([id number]): At least 40 percent but less than 60 percent impaired, limited or restricted Mobility: Walking and Moving Around Goal Status [phone]): At least 1 percent but less than 20 percent impaired, limited or restricted  Oneal Schoenberger,CINDY 04/27/2012, 2:37  PM  Physician Documentation Your signature is required to indicate approval of the treatment plan as stated above.  Please sign and either send electronically or make a copy of this report for your files and return this physician signed original.   Please mark one 1.__approve of plan  2. ___approve of plan with  the following conditions.   ______________________________                                                          _____________________ Physician Signature                                                                                                             Date

## 2012-05-03 ENCOUNTER — Inpatient Hospital Stay (HOSPITAL_COMMUNITY): Admission: RE | Admit: 2012-05-03 | Payer: Medicare HMO | Source: Ambulatory Visit

## 2012-05-03 ENCOUNTER — Ambulatory Visit (HOSPITAL_COMMUNITY): Payer: Medicare HMO | Admitting: Physical Therapy

## 2012-05-04 ENCOUNTER — Ambulatory Visit (HOSPITAL_COMMUNITY): Payer: Medicare HMO | Admitting: Physical Therapy

## 2012-05-04 ENCOUNTER — Ambulatory Visit (HOSPITAL_COMMUNITY)
Admission: RE | Admit: 2012-05-04 | Discharge: 2012-05-04 | Disposition: A | Discharge status: Home / Self Care | Payer: Medicare HMO | Source: Ambulatory Visit | Attending: Physical Therapy | Admitting: Physical Therapy

## 2012-05-04 DIAGNOSIS — R29898 Other symptoms and signs involving the musculoskeletal system: Secondary | ICD-10-CM

## 2012-05-04 DIAGNOSIS — R262 Difficulty in walking, not elsewhere classified: Secondary | ICD-10-CM

## 2012-05-04 NOTE — Progress Notes (Signed)
Physical Therapy Treatment Patient Details  Name: ALEESE KAMPS MRN: [id number] Date of Birth: 09-22-1945  Today's Date: 05/04/2012 Time: 0865-7846 PT Time Calculation (min): 58 min Charges: 1 Traction, Manual x8 min, TE x 15, NMR x 18 Visit#: 17  of 24   Re-eval: 05/27/12   Authorization: put in new authorization to be good until 10/8- has not returned sent today check with Angelica Chessman - still no word on 05/04/12  Authorization Time Period:    Authorization Visit#:   of     Subjective: Symptoms/Limitations Symptoms: Pt reports that she has been using her TM quite a lot at home so her legs are sore.   Exercise/Treatments Supine Bridge: 15 reps Straight Leg Raise: 15 reps (BLE, cueing for ab set) Sidelying Hip Abduction: 20 reps (BLE) Prone  Single Arm Raise: Right;Left;10 reps Straight Leg Raise: 10 reps (BLE) Opposite Arm/Leg Raise: Right arm/Left leg;Left arm/Right leg;15 reps Other Prone Lumbar Exercises: heelsqueeze 15x5 sec holds, TC's for activation of gluteal Other Prone Lumbar Exercises: Knee flexion 3# BLE x20; Anterior Tilt 5x5 sec holds w/VC's and TC's for multifidus activation  Modalities Modalities: Traction Manual Therapy Joint Mobilization: Grade I-II to R knee to decrease knee pain in Supine position w/knee flexion.  Patellar mobs all directions.  STM to R quadricepManual x8 minutes  Traction Type of Traction: Lumbar Min (lbs): 75 Max (lbs): 115 Hold Time: 60 Rest Time: 20 Time: 18  Physical Therapy Assessment and Plan PT Assessment and Plan Clinical Impression Statement: Pt continues to have greatest limitation with overall decreased LE strength and obesity which is causing improper posture and gait mechanics and pain.  requires multimodal cueing to improve multifidus and TrA contraction PT Plan: Continue to improve LE and core strength and stability to improve posture and eliminate pain.  Continue to encourage appropriate postural mechanics.  Check for  authorization from insurance.    Goals    Problem List Patient Active Problem List  Diagnosis  . Back pain  . Bilateral anterior knee pain  . Knee pain, left  . Difficulty in walking  . Bilateral leg weakness    PT Plan of Care PT Patient Instructions: Educated pt on importance of strengthening to improve overall posture which will eventually elimate pain.  Discussed importance of weight loss with knee and LBP  Tempest Frankland, PT 05/04/2012, 9:42 AM

## 2012-05-10 ENCOUNTER — Ambulatory Visit (HOSPITAL_COMMUNITY)
Admission: RE | Admit: 2012-05-10 | Discharge: 2012-05-10 | Disposition: A | Discharge status: Home / Self Care | Payer: Medicare HMO | Source: Ambulatory Visit | Attending: Internal Medicine | Admitting: Internal Medicine

## 2012-05-10 NOTE — Progress Notes (Signed)
Physical Therapy Treatment Patient Details  Name: Megan Odom MRN: [id number] Date of Birth: October 22, 1945  Today's Date: 05/10/2012 Time: 0805-0858 PT Time Calculation (min): 53 min  Visit#: 18  of 24   Re-eval: 05/27/12 Charges: Therex x 23' Traction x 18'  Authorization: Approved through 10/21    Subjective: Symptoms/Limitations Symptoms: Pt states that the traction is halping her back and hip. Pain Assessment Currently in Pain?: Yes Pain Score:   7 Pain Location: Back Pain Orientation: Right Pain Radiating Towards: R hip   Exercise/Treatments Supine Bridge: 15 reps Straight Leg Raise: 15 reps Sidelying Hip Abduction: 20 reps (BLE)  Modalities Modalities: Traction Traction Type of Traction: Lumbar Min (lbs): 75 Max (lbs): 115 Hold Time: 60 Rest Time: 20 Time: 18  Physical Therapy Assessment and Plan PT Assessment and Plan Clinical Impression Statement: Pt requires multimodal cueing to improve core control with therex. Pt also requires vc's to stabilize hips with SL hip abduction. Traction continued this session secondary to positive results with previous tx. Pt reports pain decrease to 4-5/10 at end of session. PT Plan: Continue to improve LE and core strength and stability to improve posture and eliminate pain per PT POC. Continue to encourage appropriate postural mechanics     Problem List Patient Active Problem List  Diagnosis  . Back pain  . Bilateral anterior knee pain  . Knee pain, left  . Difficulty in walking  . Bilateral leg weakness    PT - End of Session Activity Tolerance: Patient tolerated treatment well General Behavior During Session: [facility] for tasks performed Cognition: [facility] for tasks performed  Seth Bake, PTA 05/10/2012, 9:42 AM

## 2012-05-11 ENCOUNTER — Ambulatory Visit (HOSPITAL_COMMUNITY)
Admission: RE | Admit: 2012-05-11 | Discharge: 2012-05-11 | Disposition: A | Discharge status: Home / Self Care | Payer: Medicare HMO | Source: Ambulatory Visit

## 2012-05-11 DIAGNOSIS — R29898 Other symptoms and signs involving the musculoskeletal system: Secondary | ICD-10-CM

## 2012-05-11 DIAGNOSIS — R262 Difficulty in walking, not elsewhere classified: Secondary | ICD-10-CM

## 2012-05-11 NOTE — Progress Notes (Signed)
Physical Therapy Treatment Patient Details  Name: WRIGLEY WINBORNE MRN: [id number] Date of Birth: 06-02-1946  Today's Date: 05/11/2012 Time: 0850-0953 PT Time Calculation (min): 63 min  Visit#: 19  of 24   Re-eval: 05/27/12  Charge: therex 40 Lumbar traction 18'- 1 unit  Authorization: Approved through 10/21   Authorization Time Period:    Authorization Visit#:   of     Subjective: Symptoms/Limitations Symptoms: Pt stated good results with traction, helping her back and hip.  Pt does complian of increased pain R shoulder Pain Assessment Currently in Pain?: Yes Pain Score:   8 Pain Location: Shoulder Pain Orientation: Right  Objective:   Exercise/Treatments Standing Other Standing Lumbar Exercises: Gastoc st with slant board 3x 30" Seated Other Seated Lumbar Exercises: Seated posture training x 2 min with max cueing for ab sets and to reduce posterior lean Supine Bridge: 20 reps Sidelying Hip Abduction: 20 reps Prone  Single Arm Raise: Right;Left;10 reps;5 seconds Straight Leg Raise: 10 reps;Limitations Straight Leg Raises Limitations: BLE Opposite Arm/Leg Raise: Right arm/Left leg;Left arm/Right leg;15 reps Other Prone Lumbar Exercises: heelsqueeze 15x5 sec holds, TC's for activation of gluteal Other Prone Lumbar Exercises: Knee flexion 3# BLE x20; Anterior Tilt 5x5 sec holds w/VC's and TC's for multifidus activation   Lumbar traction Max 115 Min 75 Hold 60 Relax 20 Time: 34' Physical Therapy Assessment and Plan PT Assessment and Plan Clinical Impression Statement: Pt continues to require multimodal cueing for core stabillity with therex.  Pt reported pain resolved in back and legs and feels better in R shoulder following lumbar traction.   PT Plan: Continue to improve LE and core strength and stability to improve posture and eliminate pain per PT POC. Continue to encourage appropriate postural mechanics    Goals    Problem List Patient Active Problem List   Diagnosis  . Back pain  . Bilateral anterior knee pain  . Knee pain, left  . Difficulty in walking  . Bilateral leg weakness    PT - End of Session Activity Tolerance: Patient tolerated treatment well General Behavior During Session: [facility] for tasks performed Cognition: [facility] for tasks performed  GP    Juel Burrow 05/11/2012, 11:51 AM

## 2012-05-17 ENCOUNTER — Ambulatory Visit (HOSPITAL_COMMUNITY)
Admission: RE | Admit: 2012-05-17 | Discharge: 2012-05-17 | Disposition: A | Discharge status: Home / Self Care | Payer: Medicare HMO | Source: Ambulatory Visit | Attending: Physical Therapy | Admitting: Physical Therapy

## 2012-05-17 ENCOUNTER — Inpatient Hospital Stay (HOSPITAL_COMMUNITY): Admission: RE | Admit: 2012-05-17 | Payer: Medicare HMO | Source: Ambulatory Visit

## 2012-05-17 DIAGNOSIS — R29898 Other symptoms and signs involving the musculoskeletal system: Secondary | ICD-10-CM

## 2012-05-17 DIAGNOSIS — R262 Difficulty in walking, not elsewhere classified: Secondary | ICD-10-CM

## 2012-05-17 NOTE — Progress Notes (Signed)
Physical Therapy Treatment Patient Details  Name: Megan Odom MRN: [id number] Date of Birth: 07-Nov-1945  Today's Date: 05/17/2012 Time: 1500-1602 PT Time Calculation (min): 62 min  Visit#: 20  of 24   Re-eval: 05/27/12  Charge: Therex 40', lumbar traction 17'= 1 unit  Authorization: Approved through 10/21   Subjective: Symptoms/Limitations Symptoms: Megan Odom states her knees are bothering her more than her back. Pt states that her back bothers her when she goes sit to stand. She states that she has a TM but is not using it; states her husband uses it.  Pt was urged to use TM Pain Assessment Currently in Pain?: Yes Pain Score:   5 Pain Location: Back Pain Orientation: Right Pain Type: Chronic pain Pain Frequency: Intermittent  Objective:   Exercise/Treatments Aerobic Tread Mill: 1.2 MPH x 6' Machines for Strengthening Cybex Knee Extension: 2 PL 15 x Cybex Knee Flexion: 3Pl 15 reps Standing Functional Squats: 15 reps;5 seconds;Limitations Functional Squats Limitations: Minisquats Seated Other Seated Lumbar Exercises: sit to stand keeping tight abs. 5 reps each foot Supine Bridge: 20 reps  Modalities Modalities: Traction Manual Therapy Manual Therapy: Joint mobilization Joint Mobilization: Grade I-II to R knee to decrease knee pain in Supine position w/knee flexion. Patellar mobs all directions. STM to R quadricepManual x8 minutes Traction Type of Traction: Lumbar Min (lbs): 75 Max (lbs): 115 Hold Time: 45 Rest Time: 15 Time: 17'  Physical Therapy Assessment and Plan PT Assessment and Plan Clinical Impression Statement: Session began by Megan Odom, PT.  Pt with improved gait mechanics following mod vc-ing for ab sets, posture and to increase stride length with ambulation.  Pt continues to require multimodal cueing for core activation with activity to reduce lumbar pain.  Pt stated pain reduced following traction, requested increase weights next  session. PT Plan: Continue to improve LE and core strength and stability to improve posture and eliminate pain per PT POC. Continue to encourage appropriate postural mechanics   Increase weight with lumbar traction next session.    Goals    Problem List Patient Active Problem List  Diagnosis  . Back pain  . Bilateral anterior knee pain  . Knee pain, left  . Difficulty in walking  . Bilateral leg weakness    PT - End of Session Activity Tolerance: Patient tolerated treatment well General Behavior During Session: [facility] for tasks performed Cognition: [facility] for tasks performed  GP    Megan Odom 05/17/2012, 3:53 PM

## 2012-05-18 ENCOUNTER — Ambulatory Visit (HOSPITAL_COMMUNITY)
Admission: RE | Admit: 2012-05-18 | Discharge: 2012-05-18 | Disposition: A | Discharge status: Home / Self Care | Payer: Medicare HMO | Source: Ambulatory Visit | Attending: Physical Therapy | Admitting: Physical Therapy

## 2012-05-18 DIAGNOSIS — R29898 Other symptoms and signs involving the musculoskeletal system: Secondary | ICD-10-CM

## 2012-05-18 DIAGNOSIS — R262 Difficulty in walking, not elsewhere classified: Secondary | ICD-10-CM

## 2012-05-18 NOTE — Progress Notes (Signed)
Physical Therapy Treatment Patient Details  Name: Megan Odom MRN: [id number] Date of Birth: 1946-02-24  Today's Date: 05/18/2012 Time: 0842-0950 PT Time Calculation (min): 68 min Charges: 1 traction, 30' NMR, 10' Manual  Visit#: 21  of 24   Re-eval: 05/27/12   Authorization: Francine [employer]  Authorization Time Period:  Approved through 10/21  Authorization Visit#:   of     Subjective: Symptoms/Limitations Symptoms: Pt continues to report that her knee continues to bother her.  She states that she has been to the gym this morning in the water to work on exercises to help with her back pain.  Pain Assessment Currently in Pain?: Yes (Hurts a little but it only hurts when I go from STS) Pain Location: Back  Exercise/Treatments Aerobic Stationary Bike: upright bike 3 minutes  Tread Mill: 1.2 MPH x 10' (mod cueing for posture and core activiation) Machines for Strengthening Cybex Knee Extension: 3.5 PL x10 (cueing for core activation) Cybex Knee Flexion: 4.5 Pl 10 reps (cueing for core activiation) Standing Row: Both;20 reps;Theraband (VC and TC for proper posture and core activation) Theraband Level (Row): Level 4 (Blue) Shoulder Extension: Both;20 reps;Theraband (VC and TC for proper posture and core activation) Theraband Level (Shoulder Extension): Level 4 (Blue) Shoulder ADduction: Both;20 reps;Theraband Theraband Level (Shoulder Adduction): Level 4 (Blue) (VC and TC for proper posture and core activation) Seated Other Seated Lumbar Exercises: STS x2 w/cueing for posture Other Seated Lumbar Exercises: Heel roll outs w/cueing for proper posture and technqiue 10x10 sec holds  Modalities Modalities: Traction Manual Therapy Manual Therapy: Other (comment) Other Manual Therapy: PRONE: dynamic quad and hip IR and ER stretching w/Passive 30 sec hold after x5 BLE.  x10 minutes Traction Type of Traction: Lumbar Min (lbs): 95 Max (lbs): 120 Rest Time: STATIC Time: 23'  Physical  Therapy Assessment and Plan PT Assessment and Plan Clinical Impression Statement: Increased weight to traction today to decrease pain.  She has become indpendent w/her HEP and is starting to complete at the gym and home when able. Improved body awareness and able to explain proper posture with standing and STS activities. Demonstrates increased fatigue w/standing activities, however able to complete 10 min on TM w/discussion on proper posture and continue encouragment for using stabalizers.  PT Plan: Cont to improve core and LE strength in order to improve activity tolerance to reach goals.     Goals    Problem List Patient Active Problem List  Diagnosis  . Back pain  . Bilateral anterior knee pain  . Knee pain, left  . Difficulty in walking  . Bilateral leg weakness    PT - End of Session Activity Tolerance: Patient tolerated treatment well General Behavior During Session: [facility] for tasks performed Cognition: [facility] for tasks performed PT Plan of Care PT Patient Instructions: Educated on importance of core stabilization throughout her life.  Discussed importance of strength to assist with proper posture.  Discussed important of weight loss w/ knee and back pain. Encouraged to continue with gym and water activities and suggested trying to go for 15-20 minutes after work to help decrease pain and improve strength.   Kamali Sakata, PT 05/18/2012, 9:59 AM

## 2012-05-24 ENCOUNTER — Ambulatory Visit (HOSPITAL_COMMUNITY)
Admission: RE | Admit: 2012-05-24 | Discharge: 2012-05-24 | Disposition: A | Discharge status: Home / Self Care | Payer: Medicare HMO | Source: Ambulatory Visit | Attending: Orthopedic Surgery | Admitting: Orthopedic Surgery

## 2012-05-24 DIAGNOSIS — M6281 Muscle weakness (generalized): Secondary | ICD-10-CM | POA: Insufficient documentation

## 2012-05-24 DIAGNOSIS — M545 Low back pain, unspecified: Secondary | ICD-10-CM | POA: Insufficient documentation

## 2012-05-24 DIAGNOSIS — M25569 Pain in unspecified knee: Secondary | ICD-10-CM | POA: Insufficient documentation

## 2012-05-24 DIAGNOSIS — R269 Unspecified abnormalities of gait and mobility: Secondary | ICD-10-CM | POA: Insufficient documentation

## 2012-05-24 DIAGNOSIS — M25559 Pain in unspecified hip: Secondary | ICD-10-CM | POA: Insufficient documentation

## 2012-05-24 DIAGNOSIS — IMO0001 Reserved for inherently not codable concepts without codable children: Secondary | ICD-10-CM | POA: Insufficient documentation

## 2012-05-24 NOTE — Progress Notes (Signed)
Physical Therapy Treatment Patient Details  Name: Megan Odom MRN: [id number] Date of Birth: 03/29/1946  Today's Date: 05/24/2012 Time: 0800-0850 PT Time Calculation (min): 50 min Charges:  therex 28', lumbar traction 15' Visit#: 22  of 24   Re-eval: 05/27/12    Authorization: Francine [employer]ven  Authorization Time Period:  Approved through 10/21  Authorization Visit#: 6  of 10    Subjective: Symptoms/Limitations Symptoms: Pt. states she's been having those shooting pains into her arms and down her legs.  States she hasn't been walking on the treadmill or going to the gym like she should this week due to the pain and just not feeling good.  Currently with 6/10 pain. Pain Assessment Currently in Pain?: Yes Pain Score:   6 Pain Location: Back Pain Orientation: Right;Left Pain Radiating Towards: B LE's and B UE's "shooting" pain.   Exercise/Treatments Aerobic Tread Mill: 1.3 MPH x 10' (4 30" standing rest breaks every 2.5 minutes) UBE (Upper Arm Bike): 4' @ 2.5 backwards Machines for Strengthening Cybex Knee Extension: 3.5 PL x15 Cybex Knee Flexion: 4.5 Pl 15 reps   Modalities Modalities: Traction Traction Type of Traction: Lumbar Min (lbs): n/a Max (lbs): 120 Hold Time: STATIC Rest Time: STATIC Time: 46'  Physical Therapy Assessment and Plan PT Assessment and Plan Clinical Impression Statement: Pt. pleased with relief achieved from lumbar traction; states she can tell a difference in her mobility but the pain has not subsided as she wants.  Encouraged pt. to continue activity outside of therapy, i.e. gym or walking program.  Improving postural awareness. PT Plan: Cont to improve core and LE strength in order to improve activity tolerance to reach goals. Re-eval X 2 more sessions     Problem List Patient Active Problem List  Diagnosis  . Back pain  . Bilateral anterior knee pain  . Knee pain, left  . Difficulty in walking  . Bilateral leg weakness    PT - End of  Session Activity Tolerance: Patient tolerated treatment well General Behavior During Session: [facility] for tasks performed Cognition: [facility] for tasks performed   Lurena Nida, PTA/CLT 05/24/2012, 8:51 AM

## 2012-05-25 ENCOUNTER — Ambulatory Visit (HOSPITAL_COMMUNITY): Payer: Medicare HMO

## 2012-05-31 ENCOUNTER — Ambulatory Visit (HOSPITAL_COMMUNITY)
Admission: RE | Admit: 2012-05-31 | Discharge: 2012-05-31 | Payer: Medicare HMO | Source: Ambulatory Visit | Attending: Orthopedic Surgery | Admitting: Orthopedic Surgery

## 2012-05-31 NOTE — Progress Notes (Signed)
Pt presents to therapy reporting a headache, nausea, and R arm pain. Pt states that her arm is hurting because she has had a knot in it for a while. Pt reports that she is not up to participating in therapy today. Therapist offered to take pt to the ED but pt refused. Pt advised to contact MD if sx persist. Therapy held today.  Seth Bake, PTA 05/31/12

## 2012-06-01 ENCOUNTER — Inpatient Hospital Stay (HOSPITAL_COMMUNITY): Admission: RE | Admit: 2012-06-01 | Payer: Medicare HMO | Source: Ambulatory Visit | Admitting: Physical Therapy

## 2012-06-07 ENCOUNTER — Ambulatory Visit (HOSPITAL_COMMUNITY): Payer: Medicare HMO | Admitting: Physical Therapy

## 2012-06-07 ENCOUNTER — Ambulatory Visit (HOSPITAL_COMMUNITY)
Admission: RE | Admit: 2012-06-07 | Discharge: 2012-06-07 | Disposition: A | Discharge status: Home / Self Care | Payer: Medicare HMO | Source: Ambulatory Visit | Attending: Orthopedic Surgery | Admitting: Orthopedic Surgery

## 2012-06-07 DIAGNOSIS — R262 Difficulty in walking, not elsewhere classified: Secondary | ICD-10-CM

## 2012-06-07 DIAGNOSIS — R29898 Other symptoms and signs involving the musculoskeletal system: Secondary | ICD-10-CM

## 2012-06-07 NOTE — Evaluation (Signed)
Physical Therapy Re-evaluation  Patient Details  Name: Megan Odom MRN: [id number] Date of Birth: 09-Mar-1946  Today's Date: 06/07/2012 Time: 4098-1191 PT Time Calculation (min): 43 min  Visit#: 23  of 23    Authorization: [employer]  Authorization Time Period: through 10/21    Past Medical History:  Past Medical History  Diagnosis Date  . Hypertension   . Asthma   . Arthritis   . Hep C w/o coma, chronic    Past Surgical History:  Past Surgical History  Procedure Date  . Cholecystectomy   . Appendectomy   . Abdominal hysterectomy   . Breast surgery     Subjective Symptoms/Limitations Symptoms: Megan Odom states that she is going to go see an neurologist next Thursday.   How long can you sit comfortably?: The patient states she is able to sit better now. She is able to go to the theater and sit through a movie not. How long can you stand comfortably?: The patient is able to stand for about 20 minute now.  She was able to stand for 15 minutes. How long can you walk comfortably?: The patient states she is walking better but she is not being consistant in her walking.  She states that the longest she has walked for is 15 minutes this is easier than it was. Pain Assessment Currently in Pain?: Yes Pain Score:   4 Pain Location: Back Pain Orientation: Lower Pain Radiating Towards: right hip   Sensation/Coordination/Flexibility/Functional Tests Functional Tests Functional Tests: LEFS was  28/ now 28/64  Assessment RLE Strength Right Hip Flexion: 5/5 (was 4/5) Right Hip Extension: 4/5 (was 3+/5) Right Hip ABduction: 3+/5 (was 3+) Right Hip ADduction: 3/5 (was 3-/5) Right Knee Flexion: 5/5 (4+/5 was 3+/5) Right Knee Extension: 5/5 (was 4+/5) Right Ankle Dorsiflexion: 5/5 (was 4/5) LLE Strength Left Hip Flexion: 5/5 Left Hip Extension: 4/5 (was 3+/5) Left Hip ABduction: 4/5 (was 4/5) Left Hip ADduction: 4/5 (was 3/5) Left Knee Flexion: 5/5 (was 4/5) Left  Knee Extension: 5/5 (was 4/5) Lumbar AROM Lumbar Flexion: wnl Lumbar Extension: wnl improved pain Lumbar - Right Side Bend: wfl Lumbar - Left Side Bend: wfl Lumbar - Right Rotation: wfl Lumbar - Left Rotation: wfl  Exercise/Treatments Supine Bridge: 15 reps Straight Leg Raise: 10 reps Sidelying Hip Abduction: 15 reps Prone  Straight Leg Raise: 15 reps    Physical Therapy Assessment and Plan PT Assessment and Plan Clinical Impression Statement: Pt states that she can tell that she has improved with therapy.  PT still has pain but walking and functional mobility is easier.  Pt is I in HEP at this time. PT Plan: Pt to be discharged to home program.  Pt has progressed well although she still has demonstated weakness.   Pt was given a HEP concentrating on current deficits.      Goals Home Exercise Program Pt will Perform Home Exercise Program: Independently PT Goal: Perform Home Exercise Program - Progress: Met PT Short Term Goals PT Short Term Goal 1: I in HEP PT Short Term Goal 1 - Progress: Met PT Short Term Goal 2: Pain has gone down 3 levels. PT Short Term Goal 3: Pt is able to stand for 20 minutes was 15. PT Short Term Goal 3 - Progress: Met PT Short Term Goal 4: Pt is able to walk for 15 minutes nowl PT Short Term Goal 4 - Progress: Met PT Long Term Goals Time to Complete Long Term Goals: 8 weeks PT Long Term Goal 1:  Pt is I in HEP PT Long Term Goal 1 - Progress: Met PT Long Term Goal 2: Pt back pain has been a 7 at the highest; lowest is a 0.  Had rated from an 8 to 12 out of 10 PT Long Term Goal 2 - Progress: Progressing toward goal Long Term Goal 3: Pt is able to sit for an hour and a half which is at goal Long Term Goal 3 Progress: Met Long Term Goal 4: Pt has not worked up to walking for 30 minutes yet. Long Term Goal 4 Progress: Progressing toward goal PT Long Term Goal 5: Pt goal was to be able to stand for 45 minutes currently pt is able to stand for 20 minutes  at a time. Long Term Goal 5 Progress: Progressing toward goal PT Long Term Goal 6: LEFS is at a 31/64 goal was 36/64  Problem List Patient Active Problem List  Diagnosis  . Back pain  . Bilateral anterior knee pain  . Knee pain, left  . Difficulty in walking  . Bilateral leg weakness    PT - End of Session Activity Tolerance: Patient tolerated treatment well General Behavior During Session: [facility] for tasks performed Cognition: [facility] for tasks performed PT Plan of Care PT Home Exercise Plan: given.  GP Functional Assessment Tool Used: based on professional assessment. Mobility: Walking and Moving Around Current Status [phone]): At least 20 percent but less than 40 percent impaired, limited or restricted Mobility: Walking and Moving Around Goal Status [phone]): At least 1 percent but less than 20 percent impaired, limited or restricted  RUSSELL,CINDY 06/07/2012, 1:32 PM  Physician Documentation Your signature is required to indicate approval of the treatment plan as stated above.  Please sign and either send electronically or make a copy of this report for your files and return this physician signed original.   Please mark one 1.__approve of plan  2. ___approve of plan with the following conditions.   ______________________________                                                          _____________________ Physician Signature                                                                                                             Date

## 2012-11-08 ENCOUNTER — Emergency Department (HOSPITAL_COMMUNITY): Payer: Medicare HMO

## 2012-11-08 ENCOUNTER — Emergency Department (HOSPITAL_COMMUNITY)
Admission: EM | Admit: 2012-11-08 | Discharge: 2012-11-08 | Disposition: A | Discharge status: Home / Self Care | Payer: Medicare HMO | Attending: Emergency Medicine | Admitting: Emergency Medicine

## 2012-11-08 ENCOUNTER — Encounter (HOSPITAL_COMMUNITY): Payer: Self-pay | Admitting: *Deleted

## 2012-11-08 DIAGNOSIS — IMO0002 Reserved for concepts with insufficient information to code with codable children: Secondary | ICD-10-CM | POA: Insufficient documentation

## 2012-11-08 DIAGNOSIS — R059 Cough, unspecified: Secondary | ICD-10-CM | POA: Insufficient documentation

## 2012-11-08 DIAGNOSIS — J45909 Unspecified asthma, uncomplicated: Secondary | ICD-10-CM | POA: Insufficient documentation

## 2012-11-08 DIAGNOSIS — I10 Essential (primary) hypertension: Secondary | ICD-10-CM | POA: Insufficient documentation

## 2012-11-08 DIAGNOSIS — F17200 Nicotine dependence, unspecified, uncomplicated: Secondary | ICD-10-CM | POA: Insufficient documentation

## 2012-11-08 DIAGNOSIS — Z79899 Other long term (current) drug therapy: Secondary | ICD-10-CM | POA: Insufficient documentation

## 2012-11-08 DIAGNOSIS — K7682 Hepatic encephalopathy: Secondary | ICD-10-CM | POA: Insufficient documentation

## 2012-11-08 DIAGNOSIS — N390 Urinary tract infection, site not specified: Secondary | ICD-10-CM | POA: Insufficient documentation

## 2012-11-08 DIAGNOSIS — G44209 Tension-type headache, unspecified, not intractable: Secondary | ICD-10-CM

## 2012-11-08 DIAGNOSIS — R110 Nausea: Secondary | ICD-10-CM | POA: Insufficient documentation

## 2012-11-08 DIAGNOSIS — R61 Generalized hyperhidrosis: Secondary | ICD-10-CM | POA: Insufficient documentation

## 2012-11-08 DIAGNOSIS — R05 Cough: Secondary | ICD-10-CM | POA: Insufficient documentation

## 2012-11-08 DIAGNOSIS — R3915 Urgency of urination: Secondary | ICD-10-CM | POA: Insufficient documentation

## 2012-11-08 DIAGNOSIS — R079 Chest pain, unspecified: Secondary | ICD-10-CM | POA: Insufficient documentation

## 2012-11-08 DIAGNOSIS — J029 Acute pharyngitis, unspecified: Secondary | ICD-10-CM | POA: Insufficient documentation

## 2012-11-08 DIAGNOSIS — K7290 Hepatic failure, unspecified without coma: Secondary | ICD-10-CM | POA: Insufficient documentation

## 2012-11-08 LAB — URINALYSIS, ROUTINE W REFLEX MICROSCOPIC
Bilirubin Urine: NEGATIVE
Glucose, UA: NEGATIVE mg/dL
Hgb urine dipstick: NEGATIVE
Ketones, ur: NEGATIVE mg/dL
Nitrite: POSITIVE — AB
Protein, ur: NEGATIVE mg/dL
Specific Gravity, Urine: >1.03 — ABNORMAL HIGH (ref 1.005–1.030)
Urobilinogen, UA: 4 mg/dL — ABNORMAL HIGH (ref 0.0–1.0)
pH: 6 (ref 5.0–8.0)

## 2012-11-08 LAB — URINE MICROSCOPIC-ADD ON

## 2012-11-08 LAB — CBC
HCT: 40.7 % (ref 36.0–46.0)
Hemoglobin: 13.5 g/dL (ref 12.0–15.0)
MCH: 30.3 pg (ref 26.0–34.0)
MCHC: 33.2 g/dL (ref 30.0–36.0)
MCV: 91.5 fL (ref 78.0–100.0)
Platelets: 283 10*3/uL (ref 150–400)
RBC: 4.45 MIL/uL (ref 3.87–5.11)
RDW: 12.4 % (ref 11.5–15.5)
WBC: 8.6 10*3/uL (ref 4.0–10.5)

## 2012-11-08 LAB — BASIC METABOLIC PANEL
BUN: 15 mg/dL (ref 6–23)
CO2: 21 mEq/L (ref 19–32)
Calcium: 8.5 mg/dL (ref 8.4–10.5)
Chloride: 103 mEq/L (ref 96–112)
Creatinine, Ser: 0.69 mg/dL (ref 0.50–1.10)
GFR calc Af Amer: >90 mL/min (ref >90)
GFR calc non Af Amer: >90 mL/min (ref >90)
Glucose, Bld: 135 mg/dL — ABNORMAL HIGH (ref 70–99)
Potassium: 3.1 mEq/L — ABNORMAL LOW (ref 3.5–5.1)
Sodium: 135 mEq/L (ref 135–145)

## 2012-11-08 LAB — TROPONIN I: Troponin I: <0.3 ng/mL (ref <0.30)

## 2012-11-08 LAB — POCT PREGNANCY, URINE: Preg Test, Ur: NEGATIVE

## 2012-11-08 MED ORDER — DEXAMETHASONE SODIUM PHOSPHATE 4 MG/ML IJ SOLN
10.0000 mg | Freq: Once | INTRAMUSCULAR | Status: AC
  Administered 2012-11-08: 10 mg via INTRAVENOUS
  Filled 2012-11-08: qty 3

## 2012-11-08 MED ORDER — HYDROCODONE-ACETAMINOPHEN 5-325 MG PO TABS: 1.0000 | ORAL_TABLET | ORAL | Status: DC | PRN

## 2012-11-08 MED ORDER — SODIUM CHLORIDE 0.9 % IV SOLN
INTRAVENOUS | Status: DC
  Administered 2012-11-08: 14:00:00 via INTRAVENOUS

## 2012-11-08 MED ORDER — METOCLOPRAMIDE HCL 5 MG/ML IJ SOLN
10.0000 mg | Freq: Once | INTRAMUSCULAR | Status: AC
  Administered 2012-11-08: 10 mg via INTRAVENOUS
  Filled 2012-11-08: qty 2

## 2012-11-08 MED ORDER — SULFAMETHOXAZOLE-TRIMETHOPRIM 800-160 MG PO TABS: 1.0000 | ORAL_TABLET | Freq: Two times a day (BID) | ORAL | Status: DC

## 2012-11-08 MED ORDER — DIPHENHYDRAMINE HCL 50 MG/ML IJ SOLN
25.0000 mg | Freq: Once | INTRAMUSCULAR | Status: AC
  Administered 2012-11-08: 25 mg via INTRAVENOUS
  Filled 2012-11-08: qty 1

## 2012-11-08 MED ORDER — NYSTATIN-TRIAMCINOLONE 100000-0.1 UNIT/GM-% EX CREA
TOPICAL_CREAM | Freq: Two times a day (BID) | CUTANEOUS | Status: DC
  Administered 2012-11-08: 14:00:00 via TOPICAL
  Filled 2012-11-08: qty 15

## 2012-11-08 NOTE — ED Notes (Signed)
Chest pain and headache,  Sent from Dr Letitia Neri office for evaluation.  Nausea, and sweats

## 2012-11-08 NOTE — ED Notes (Signed)
Pt also complains of HA 8/10 over entire head.

## 2012-11-08 NOTE — ED Provider Notes (Signed)
History     This chart was scribed for Osvaldo Human, MD, MD by Smitty Pluck, ED Scribe. The patient was seen in room APA04/APA04 and the patient's care was started at 1:57 PM.   CSN: [id number]  Arrival date & time 11/08/12  1303       Chief Complaint  Patient presents with  . Chest Pain    The history is provided by the patient and medical records. No language interpreter was used.   Megan Odom is a 67 y.o. female with h/o HTN, asthma, arthritis and Hepatitis C who presents to the Emergency Department complaining of constant, moderate, sharp, left chest pain onset 2 days ago. Pain is rated at 8/10. The episodes of pain last 1 minute. Pt was seen by Dr. Felecia Shelling and he referred her to ED. She states that Dr Felecia Shelling reduced her medications today. She reports having diaphoresis, moderate generalized headache, non-productive cough, sore throat and nausea. She states she has urinary urgency but states she takes diuretic medication. Pt denies radiation of pain, fever, chills, vomiting, diarrhea, weakness, cough, SOB and any other pain. She reports having epidural injections for arthritis. Pt states she has hx of cholecystectomy, appendectomy, hysterectomy and breast surgery. She states she smokes cigarettes 1 pack/3 days. She denies drinking alcohol and using illegal drugs.   PCP is Dr. Felecia Shelling Past Medical History  Diagnosis Date  . Hypertension   . Asthma   . Arthritis   . Hep C w/o coma, chronic     Past Surgical History  Procedure Laterality Date  . Cholecystectomy    . Appendectomy    . Abdominal hysterectomy    . Breast surgery      Family History  Problem Relation Age of Onset  . Arthritis    . Cancer    . Asthma    . Diabetes      History  Substance Use Topics  . Smoking status: Current Every Day Smoker  . Smokeless tobacco: Not on file  . Alcohol Use: No    OB History   Grav Para Term Preterm Abortions TAB SAB Ect Mult Living                  Review  of Systems  Constitutional: Positive for diaphoresis. Negative for fever and chills.  HENT: Positive for sore throat.   Eyes: Negative for visual disturbance.  Respiratory: Positive for cough. Negative for shortness of breath.   Gastrointestinal: Positive for nausea. Negative for vomiting, abdominal pain and diarrhea.  Genitourinary: Positive for urgency. Negative for dysuria.  Musculoskeletal: Negative for back pain.  Skin: Negative for rash.  Neurological: Positive for headaches.  All other systems reviewed and are negative.    Allergies  Darvocet; Latex; Penicillins; and Tetracyclines & related  Home Medications   Current Outpatient Rx  Name  Route  Sig  Dispense  Refill  . albuterol (PROVENTIL HFA;VENTOLIN HFA) 108 (90 BASE) MCG/ACT inhaler   Inhalation   Inhale 2 puffs into the lungs every 6 (six) hours as needed. Shortness of breath          . albuterol (PROVENTIL) (2.5 MG/3ML) 0.083% nebulizer solution   Nebulization   Take 2.5 mg by nebulization every 6 (six) hours as needed. For shortness of breath         . amitriptyline (ELAVIL) 10 MG tablet   Oral   Take 10 mg by mouth at bedtime.           Marland Kitchen  amLODipine (NORVASC) 5 MG tablet   Oral   Take 5 mg by mouth daily.           Marland Kitchen aspirin EC 81 MG tablet   Oral   Take 81 mg by mouth daily.           Marland Kitchen aspirin-acetaminophen-caffeine (HEADACHE RELIEF) 250-250-65 MG per tablet   Oral   Take 1 tablet by mouth as needed. For headache pain         . beclomethasone (QVAR) 40 MCG/ACT inhaler   Inhalation   Inhale 2 puffs into the lungs 2 (two) times daily.           . Doxylamine Succinate, Sleep, (SLEEP AID PO)   Oral   Take 1 tablet by mouth at bedtime as needed. Sleep         . fluticasone (FLONASE) 50 MCG/ACT nasal spray   Nasal   Place 2 sprays into the nose daily.           . furosemide (LASIX) 40 MG tablet   Oral   Take 40 mg by mouth daily.           Marland Kitchen ibuprofen (ADVIL,MOTRIN) 800 MG  tablet   Oral   Take 800 mg by mouth every 8 (eight) hours as needed. Pain           . loratadine (CLARITIN) 10 MG tablet   Oral   Take 5 mg by mouth daily.           Marland Kitchen omeprazole (PRILOSEC) 40 MG capsule   Oral   Take 40 mg by mouth 2 (two) times daily.           Marland Kitchen PARoxetine (PAXIL) 20 MG tablet   Oral   Take 20 mg by mouth 2 (two) times daily.          . potassium chloride SA (K-DUR,KLOR-CON) 20 MEQ tablet   Oral   Take 20 mEq by mouth daily.          . traMADol-acetaminophen (ULTRACET) 37.5-325 MG per tablet   Oral   Take 1 tablet by mouth every 6 (six) hours as needed. For pain         . valsartan-hydrochlorothiazide (DIOVAN-HCT) 160-12.5 MG per tablet   Oral   Take 1 tablet by mouth daily.             BP 137/75  Pulse 61  Temp(Src) 98.2 F (36.8 C) (Oral)  Resp 20  Ht 5\' 5"  (1.651 m)  Wt 280 lb (127.007 kg)  BMI 46.59 kg/m2  SpO2 96%  Physical Exam  Nursing note and vitals reviewed. Constitutional: She is oriented to person, place, and time. She appears well-developed and well-nourished. No distress.  HENT:  Head: Normocephalic and atraumatic.  Scaling rash at corners of mouth   Eyes: EOM are normal. Pupils are equal, round, and reactive to light.  Neck: Normal range of motion. Neck supple. No tracheal deviation present.  Cardiovascular: Normal rate, regular rhythm and normal heart sounds.   No murmur heard. Pulmonary/Chest: Effort normal. No respiratory distress. She has no wheezes. She has no rales.  Abdominal: Soft. Bowel sounds are normal. She exhibits no distension. There is no tenderness. There is no rebound and no guarding.  Musculoskeletal: Normal range of motion. She exhibits no edema.  Neurological: She is alert and oriented to person, place, and time.  Skin: Skin is warm and dry.  Psychiatric: She has a normal mood and affect.  Her behavior is normal.    ED Course  Procedures (including critical care time) DIAGNOSTIC  STUDIES: Oxygen Saturation is 96% on room air, adequate by my interpretation.    COORDINATION OF CARE: 2:09 PM Discussed ED treatment with pt and pt agrees.   Results for orders placed during the hospital encounter of 11/08/12  CBC      Result Value Range   WBC 8.6  4.0 - 10.5 K/uL   RBC 4.45  3.87 - 5.11 MIL/uL   Hemoglobin 13.5  12.0 - 15.0 g/dL   HCT 16.1  09.6 - 04.5 %   MCV 91.5  78.0 - 100.0 fL   MCH 30.3  26.0 - 34.0 pg   MCHC 33.2  30.0 - 36.0 g/dL   RDW 40.9  81.1 - 91.4 %   Platelets 283  150 - 400 K/uL  POCT PREGNANCY, URINE      Result Value Range   Preg Test, Ur NEGATIVE  NEGATIVE       Dg Chest Port 1 View  11/08/2012  *RADIOLOGY REPORT*  Clinical Data: Chest pain.  History of asthma and hepatitis C.  PORTABLE CHEST - 1 VIEW  Comparison: 04/24/2012.  Findings: Portable technique slightly limited. The patient would eventually benefit from follow-up two-view chest with cardiac leads removed.  No infiltrate, congestive heart failure or pneumothorax.  Heart size top normal.  Slightly tortuous aorta.  Mild central pulmonary vascular prominence stable.  IMPRESSION: No acute abnormality.  Please see above.   Original Report Authenticated By: Lacy Duverney, M.D.       1:50 PM  Date: 11/08/2012  Rate: 60  Rhythm: normal sinus rhythm  QRS Axis: left  Intervals: normal QRS:  Left ventricular hypertroph  ST/T Wave abnormalities: normal  Conduction Disutrbances:left anterior fascicular block  Narrative Interpretation: Abnormal EKG  Old EKG Reviewed: unchanged  3:02 PM Results for orders placed during the hospital encounter of 11/08/12  TROPONIN I      Result Value Range   Troponin I <0.30  <0.30 ng/mL  CBC      Result Value Range   WBC 8.6  4.0 - 10.5 K/uL   RBC 4.45  3.87 - 5.11 MIL/uL   Hemoglobin 13.5  12.0 - 15.0 g/dL   HCT 78.2  95.6 - 21.3 %   MCV 91.5  78.0 - 100.0 fL   MCH 30.3  26.0 - 34.0 pg   MCHC 33.2  30.0 - 36.0 g/dL   RDW 08.6  57.8 - 46.9 %    Platelets 283  150 - 400 K/uL  BASIC METABOLIC PANEL      Result Value Range   Sodium 135  135 - 145 mEq/L   Potassium 3.1 (*) 3.5 - 5.1 mEq/L   Chloride 103  96 - 112 mEq/L   CO2 21  19 - 32 mEq/L   Glucose, Bld 135 (*) 70 - 99 mg/dL   BUN 15  6 - 23 mg/dL   Creatinine, Ser 6.29  0.50 - 1.10 mg/dL   Calcium 8.5  8.4 - 52.8 mg/dL   GFR calc non Af Amer >90  >90 mL/min   GFR calc Af Amer >90  >90 mL/min  URINALYSIS, ROUTINE W REFLEX MICROSCOPIC      Result Value Range   Color, Urine YELLOW  YELLOW   APPearance CLEAR  CLEAR   Specific Gravity, Urine >1.030 (*) 1.005 - 1.030   pH 6.0  5.0 - 8.0   Glucose, UA NEGATIVE  NEGATIVE mg/dL   Hgb urine dipstick NEGATIVE  NEGATIVE   Bilirubin Urine NEGATIVE  NEGATIVE   Ketones, ur NEGATIVE  NEGATIVE mg/dL   Protein, ur NEGATIVE  NEGATIVE mg/dL   Urobilinogen, UA 4.0 (*) 0.0 - 1.0 mg/dL   Nitrite POSITIVE (*) NEGATIVE   Leukocytes, UA SMALL (*) NEGATIVE  URINE MICROSCOPIC-ADD ON      Result Value Range   Squamous Epithelial / LPF FEW (*) RARE   WBC, UA TOO NUMEROUS TO COUNT  <3 WBC/hpf   Bacteria, UA MANY (*) RARE  POCT PREGNANCY, URINE      Result Value Range   Preg Test, Ur NEGATIVE  NEGATIVE   Dg Chest Port 1 View  11/08/2012  *RADIOLOGY REPORT*  Clinical Data: Chest pain.  History of asthma and hepatitis C.  PORTABLE CHEST - 1 VIEW  Comparison: 04/24/2012.  Findings: Portable technique slightly limited. The patient would eventually benefit from follow-up two-view chest with cardiac leads removed.  No infiltrate, congestive heart failure or pneumothorax.  Heart size top normal.  Slightly tortuous aorta.  Mild central pulmonary vascular prominence stable.  IMPRESSION: No acute abnormality.  Please see above.   Original Report Authenticated By: Lacy Duverney, M.D.     UA shows a UTI.  Chest x-ray negative.  EKG and TNI negative, no evidence of ACS.   Rx UTI with Bactrim DS bid x 7 days.  Rx headache with Rx for hydrocodone-acetaminophen,  prescription for 12 tablets.   1. UTI (lower urinary tract infection)   2. Tension headache   3. Nonspecific chest pain     I personally performed the services described in this documentation, which was scribed in my presence. The recorded information has been reviewed and is accurate.  Osvaldo Human, MD        Carleene Cooper III, MD 11/08/12 [phone]

## 2012-11-10 LAB — URINE CULTURE: Colony Count: >=100000

## 2012-11-11 ENCOUNTER — Telehealth (HOSPITAL_COMMUNITY): Payer: Self-pay | Admitting: Emergency Medicine

## 2012-11-11 NOTE — ED Notes (Signed)
+  Urine. Patient given Bactrim. Resistant. Chart sent to EDP office for review. °

## 2012-11-11 NOTE — ED Notes (Signed)
Patient has +Urine culture. Checking to see if treated appropriately. °

## 2012-11-12 ENCOUNTER — Other Ambulatory Visit (HOSPITAL_COMMUNITY): Payer: Self-pay | Admitting: Internal Medicine

## 2012-11-12 ENCOUNTER — Ambulatory Visit (HOSPITAL_COMMUNITY)
Admission: RE | Admit: 2012-11-12 | Discharge: 2012-11-12 | Disposition: A | Discharge status: Home / Self Care | Payer: Medicare HMO | Source: Ambulatory Visit | Attending: Internal Medicine | Admitting: Internal Medicine

## 2012-11-12 DIAGNOSIS — R0602 Shortness of breath: Secondary | ICD-10-CM

## 2012-11-12 DIAGNOSIS — J45909 Unspecified asthma, uncomplicated: Secondary | ICD-10-CM | POA: Insufficient documentation

## 2012-11-12 NOTE — ED Notes (Signed)
Urine Culture shows sensitivity to Ciprofloxacin patient should be switched to cipro immediate release 500 mg q12 hours for 7 days disp 14 refiill 0 per [facility] PAC

## 2012-11-14 ENCOUNTER — Telehealth (HOSPITAL_COMMUNITY): Payer: Self-pay | Admitting: Emergency Medicine

## 2012-11-28 LAB — PACEMAKER DEVICE OBSERVATION

## 2012-12-04 ENCOUNTER — Encounter (HOSPITAL_COMMUNITY): Payer: Self-pay | Admitting: *Deleted

## 2012-12-04 ENCOUNTER — Emergency Department (HOSPITAL_COMMUNITY)
Admission: EM | Admit: 2012-12-04 | Discharge: 2012-12-04 | Disposition: A | Discharge status: Home / Self Care | Payer: Medicare Other | Attending: Emergency Medicine | Admitting: Emergency Medicine

## 2012-12-04 ENCOUNTER — Emergency Department (HOSPITAL_COMMUNITY): Payer: Medicare Other

## 2012-12-04 DIAGNOSIS — T148XXA Other injury of unspecified body region, initial encounter: Secondary | ICD-10-CM

## 2012-12-04 DIAGNOSIS — W1809XA Striking against other object with subsequent fall, initial encounter: Secondary | ICD-10-CM | POA: Insufficient documentation

## 2012-12-04 DIAGNOSIS — Z7982 Long term (current) use of aspirin: Secondary | ICD-10-CM | POA: Insufficient documentation

## 2012-12-04 DIAGNOSIS — R062 Wheezing: Secondary | ICD-10-CM | POA: Insufficient documentation

## 2012-12-04 DIAGNOSIS — I10 Essential (primary) hypertension: Secondary | ICD-10-CM | POA: Insufficient documentation

## 2012-12-04 DIAGNOSIS — S40012A Contusion of left shoulder, initial encounter: Secondary | ICD-10-CM

## 2012-12-04 DIAGNOSIS — S40019A Contusion of unspecified shoulder, initial encounter: Secondary | ICD-10-CM | POA: Insufficient documentation

## 2012-12-04 DIAGNOSIS — Z8739 Personal history of other diseases of the musculoskeletal system and connective tissue: Secondary | ICD-10-CM | POA: Insufficient documentation

## 2012-12-04 DIAGNOSIS — S40029A Contusion of unspecified upper arm, initial encounter: Secondary | ICD-10-CM | POA: Insufficient documentation

## 2012-12-04 DIAGNOSIS — Z8619 Personal history of other infectious and parasitic diseases: Secondary | ICD-10-CM | POA: Insufficient documentation

## 2012-12-04 DIAGNOSIS — J45909 Unspecified asthma, uncomplicated: Secondary | ICD-10-CM | POA: Insufficient documentation

## 2012-12-04 DIAGNOSIS — W010XXA Fall on same level from slipping, tripping and stumbling without subsequent striking against object, initial encounter: Secondary | ICD-10-CM | POA: Insufficient documentation

## 2012-12-04 DIAGNOSIS — Z79899 Other long term (current) drug therapy: Secondary | ICD-10-CM | POA: Insufficient documentation

## 2012-12-04 DIAGNOSIS — Y9289 Other specified places as the place of occurrence of the external cause: Secondary | ICD-10-CM | POA: Insufficient documentation

## 2012-12-04 DIAGNOSIS — F17200 Nicotine dependence, unspecified, uncomplicated: Secondary | ICD-10-CM | POA: Insufficient documentation

## 2012-12-04 DIAGNOSIS — IMO0002 Reserved for concepts with insufficient information to code with codable children: Secondary | ICD-10-CM | POA: Insufficient documentation

## 2012-12-04 DIAGNOSIS — Y9301 Activity, walking, marching and hiking: Secondary | ICD-10-CM | POA: Insufficient documentation

## 2012-12-04 MED ORDER — HYDROCODONE-ACETAMINOPHEN 5-325 MG PO TABS
2.0000 | ORAL_TABLET | Freq: Once | ORAL | Status: AC
  Administered 2012-12-04: 2 via ORAL
  Filled 2012-12-04: qty 2

## 2012-12-04 MED ORDER — ONDANSETRON HCL 4 MG PO TABS
4.0000 mg | ORAL_TABLET | Freq: Once | ORAL | Status: AC
  Administered 2012-12-04: 4 mg via ORAL
  Filled 2012-12-04: qty 1

## 2012-12-04 MED ORDER — HYDROCODONE-ACETAMINOPHEN 5-325 MG PO TABS: ORAL_TABLET | ORAL | Status: DC

## 2012-12-04 NOTE — ED Notes (Signed)
Pt fell yesterday in water while mopping the floor, landed against the refrigerator, c/o pain to left shoulder, bilateral thighs, is able to walk but has pain in both legs when she does, denies any loc,

## 2012-12-04 NOTE — ED Provider Notes (Signed)
History     CSN: [id number]  Arrival date & time 12/04/12  1412   First MD Initiated Contact with Patient 12/04/12 1528      Chief Complaint  Patient presents with  . Fall    (Consider location/radiation/quality/duration/timing/severity/associated sxs/prior treatment) Patient is a 67 y.o. female presenting with fall. The history is provided by the patient.  Fall The accident occurred yesterday. The fall occurred while walking (slipped on a wet floor). She landed on a hard floor (Pt hit  a refrigerator during the fall). There was no blood loss. The point of impact was the left shoulder. The pain is present in the left shoulder (right and left upper thigh/pelvis). The pain is at a severity of 10/10. Pertinent negatives include no numbness, no abdominal pain, no bowel incontinence, no hematuria, no headaches, no loss of consciousness and no tingling. The symptoms are aggravated by standing and ambulation. She has tried NSAIDs for the symptoms. The treatment provided no relief.    Past Medical History  Diagnosis Date  . Hypertension   . Asthma   . Arthritis   . Hep C w/o coma, chronic     Past Surgical History  Procedure Laterality Date  . Cholecystectomy    . Appendectomy    . Abdominal hysterectomy    . Breast surgery      Family History  Problem Relation Age of Onset  . Arthritis    . Cancer    . Asthma    . Diabetes      History  Substance Use Topics  . Smoking status: Current Every Day Smoker  . Smokeless tobacco: Not on file  . Alcohol Use: No    OB History   Grav Para Term Preterm Abortions TAB SAB Ect Mult Living                  Review of Systems  Constitutional: Negative for activity change.       All ROS Neg except as noted in HPI  HENT: Negative for nosebleeds and neck pain.   Eyes: Negative for photophobia and discharge.  Respiratory: Positive for wheezing. Negative for cough and shortness of breath.   Cardiovascular: Negative for chest pain and  palpitations.  Gastrointestinal: Negative for abdominal pain, blood in stool and bowel incontinence.  Genitourinary: Negative for dysuria, frequency and hematuria.  Musculoskeletal: Positive for arthralgias. Negative for back pain.  Skin: Negative.   Neurological: Negative for dizziness, tingling, seizures, loss of consciousness, speech difficulty, numbness and headaches.  Psychiatric/Behavioral: Negative for hallucinations and confusion.    Allergies  Darvocet; Latex; Penicillins; Tetracyclines & related; and Adhesive  Home Medications   Current Outpatient Rx  Name  Route  Sig  Dispense  Refill  . albuterol (PROVENTIL HFA;VENTOLIN HFA) 108 (90 BASE) MCG/ACT inhaler   Inhalation   Inhale 2 puffs into the lungs every 6 (six) hours as needed. Shortness of breath          . albuterol (PROVENTIL) (2.5 MG/3ML) 0.083% nebulizer solution   Nebulization   Take 2.5 mg by nebulization every 6 (six) hours as needed. For shortness of breath         . amLODipine (NORVASC) 5 MG tablet   Oral   Take 5 mg by mouth daily.           Marland Kitchen aspirin EC 81 MG tablet   Oral   Take 81 mg by mouth daily.           Marland Kitchen  beclomethasone (QVAR) 40 MCG/ACT inhaler   Inhalation   Inhale 2 puffs into the lungs 2 (two) times daily.           . benzonatate (TESSALON) 100 MG capsule   Oral   Take 200 mg by mouth 3 (three) times daily as needed for cough.         . fluticasone (FLONASE) 50 MCG/ACT nasal spray   Nasal   Place 2 sprays into the nose daily.           . furosemide (LASIX) 40 MG tablet   Oral   Take 40 mg by mouth daily.           Marland Kitchen HYDROcodone-acetaminophen (NORCO/VICODIN) 5-325 MG per tablet   Oral   Take 1 tablet by mouth every 4 (four) hours as needed for pain.   12 tablet   0   . ibuprofen (ADVIL,MOTRIN) 800 MG tablet   Oral   Take 800 mg by mouth every 8 (eight) hours as needed. Pain           . loratadine (CLARITIN) 10 MG tablet   Oral   Take 10 mg by mouth  daily.          Marland Kitchen omeprazole (PRILOSEC) 40 MG capsule   Oral   Take 40 mg by mouth 2 (two) times daily.           Marland Kitchen PARoxetine (PAXIL) 20 MG tablet   Oral   Take 20 mg by mouth 2 (two) times daily.          . potassium chloride SA (K-DUR,KLOR-CON) 20 MEQ tablet   Oral   Take 20 mEq by mouth daily.          . valsartan-hydrochlorothiazide (DIOVAN-HCT) 160-12.5 MG per tablet   Oral   Take 1 tablet by mouth daily.           . Vitamin D, Ergocalciferol, (DRISDOL) 50000 UNITS CAPS   Oral   Take 50,000 Units by mouth every 7 (seven) days.           BP 106/68  Pulse 73  Temp(Src) 97.1 F (36.2 C) (Oral)  Resp 20  SpO2 100%  Physical Exam  Nursing note and vitals reviewed. Constitutional: She is oriented to person, place, and time. She appears well-developed and well-nourished.  Non-toxic appearance.  HENT:  Head: Normocephalic.  Right Ear: Tympanic membrane and external ear normal.  Left Ear: Tympanic membrane and external ear normal.  Eyes: EOM and lids are normal. Pupils are equal, round, and reactive to light.  Neck: Normal range of motion. Neck supple. Carotid bruit is not present.  Cardiovascular: Normal rate, regular rhythm, normal heart sounds, intact distal pulses and normal pulses.   Pulmonary/Chest: Breath sounds normal. No respiratory distress.  Abdominal: Soft. Bowel sounds are normal. There is no tenderness. There is no guarding.  Musculoskeletal: Normal range of motion.  Lymphadenopathy:       Head (right side): No submandibular adenopathy present.       Head (left side): No submandibular adenopathy present.    She has no cervical adenopathy.  Neurological: She is alert and oriented to person, place, and time. She has normal strength. No cranial nerve deficit or sensory deficit.  Skin: Skin is warm and dry.  Psychiatric: She has a normal mood and affect. Her speech is normal.    ED Course  Procedures (including critical care time)  Labs  Reviewed - No data to display No results  found.   No diagnosis found.    MDM  I have reviewed nursing notes, vital signs, and all appropriate lab and imaging results for this patient. Pt fell on a wet floor, hit a refrigerator on the way down. She has pain of the left shoulder, both thighs, and soreness of multiple areas. Xray of the left shoulder is negative. Xray of the pelvis is negative for fx or dislocation. Rx for norco  Added to current medication.       Kathie Dike, PA-C 12/06/12 1553

## 2012-12-04 NOTE — ED Notes (Signed)
Pt c/o left shoulder pain and bilateral leg pain x1 day. Pt states she slipped on fell yesterday and fell onto a refrigerator. Pt states pain has gradually worsened.

## 2012-12-06 ENCOUNTER — Other Ambulatory Visit (HOSPITAL_COMMUNITY): Payer: Self-pay | Admitting: Cardiovascular Disease

## 2012-12-06 DIAGNOSIS — R079 Chest pain, unspecified: Secondary | ICD-10-CM

## 2012-12-06 DIAGNOSIS — R0602 Shortness of breath: Secondary | ICD-10-CM

## 2012-12-07 NOTE — ED Provider Notes (Signed)
Medical screening examination/treatment/procedure(s) were performed by non-physician practitioner and as supervising physician I was immediately available for consultation/collaboration.   Ednamae Schiano L Tanay Massiah, MD 12/07/12 1243 

## 2012-12-10 ENCOUNTER — Ambulatory Visit (HOSPITAL_COMMUNITY)
Admission: RE | Admit: 2012-12-10 | Discharge: 2012-12-10 | Disposition: A | Discharge status: Home / Self Care | Payer: Medicare Other | Source: Ambulatory Visit | Attending: Neurology | Admitting: Neurology

## 2012-12-10 ENCOUNTER — Encounter (HOSPITAL_COMMUNITY): Payer: Self-pay | Admitting: Physical Therapy

## 2012-12-10 DIAGNOSIS — R262 Difficulty in walking, not elsewhere classified: Secondary | ICD-10-CM | POA: Insufficient documentation

## 2012-12-10 DIAGNOSIS — IMO0001 Reserved for inherently not codable concepts without codable children: Secondary | ICD-10-CM | POA: Insufficient documentation

## 2012-12-10 DIAGNOSIS — M545 Low back pain, unspecified: Secondary | ICD-10-CM | POA: Insufficient documentation

## 2012-12-10 DIAGNOSIS — M6281 Muscle weakness (generalized): Secondary | ICD-10-CM | POA: Insufficient documentation

## 2012-12-10 DIAGNOSIS — R29898 Other symptoms and signs involving the musculoskeletal system: Secondary | ICD-10-CM

## 2012-12-10 DIAGNOSIS — M79609 Pain in unspecified limb: Secondary | ICD-10-CM | POA: Insufficient documentation

## 2012-12-10 NOTE — Evaluation (Addendum)
Physical Therapy Evaluation  Patient Details  Name: MACEE VENABLES MRN: [id number] Date of Birth: [birth year] Charge:  Evaluation/ pelvic traction Today's Date: 12/10/2012 Time: 1515-1610 PT Time Calculation (min): 55 min              Visit#: 1 of 8  Re-eval: 01/09/13 Assessment Diagnosis: lumbar pain, B knee pain  Authorization: [employer]     Past Medical History:  Past Medical History  Diagnosis Date  . Hypertension   . Asthma   . Arthritis   . Hep C w/o coma, chronic    Past Surgical History:  Past Surgical History  Procedure Laterality Date  . Cholecystectomy    . Appendectomy    . Abdominal hysterectomy    . Breast surgery      Subjective Symptoms/Limitations Symptoms: Ms. Rickert states that her right leg is giving her trouble.  She states that she has been falling.  The last time she fell was on 12/03/2012.  She was taken out of work until today.  She has had therapy for her back last year and states that she feel  that the traciton helpped her pain.  How long can you sit comfortably?: Able to sit for 30 minutes and then she feels like there is pins sticking in her buttock. How long can you stand comfortably?: She states that she is not able to stand with any comfort.   How long can you walk comfortably?: She is unable to walk greater than 20 minutes and then her legs and breathing cause her to stop. Pain Assessment Currently in Pain?: No/denies (transitional motion causes pain 8/10; pt took pain meds prio) Pain Location: Back Pain Orientation: Left;Right Pain Type: Chronic pain Pain Radiating Towards: Right knee  sometimes to the foot  Pain Onset: More than a month ago Pain Frequency: Constant Pain Relieving Factors: rest   Balance:  Pt has fallen due to weakness in leg several times. Sensation/Coordination/Flexibility/Functional Tests Functional Tests Functional Tests: LEFS 26/80  Assessment RLE Strength Right Hip Flexion: 4/5 Right Hip Extension:  3/5 Right Hip ABduction: 3+/5 (was 3+) Right Hip ADduction: 3/5 (was 3-/5) Right Knee Flexion: 3+/5 Right Knee Extension: 4/5 Right Ankle Dorsiflexion:  (4+/5 ) LLE Strength Left Hip Flexion: 4/5 Left Hip Extension: 3/5 Left Hip ABduction: 4/5 ( 4-/5) Left Hip ADduction: 4/5 (was 3/5) Left Knee Flexion: 3+/5 Left Knee Extension: 4/5 ( 4/5) Lumbar AROM Lumbar Flexion: wnl with difficulty upon return. Lumbar Extension: wnl improved pain Lumbar - Right Side Bend: wfl Lumbar - Left Side Bend: wfl Lumbar - Right Rotation: wfl Lumbar - Left Rotation: wfl  Exercise/Treatments   Modalities Modalities: Traction Traction Type of Traction: Lumbar Max (lbs): 80 Hold Time: static Time: 16  Physical Therapy Assessment and Plan PT Assessment and Plan Clinical Impression Statement: Pt with decreased LE and core strength causing increased pain with functional activity as well as transitional motion.  PT will benefit from skilled PT to improve strength and decrease pain. Rehab Potential: Good PT Frequency: Min 2X/week PT Duration: 4 weeks PT Treatment/Interventions: Therapeutic exercise;Patient/family education;Modalities PT Plan: Pt to begin stab exercises next treatment.    Goals Home Exercise Program Pt will Perform Home Exercise Program: Independently PT Goal: Perform Home Exercise Program - Progress: Met PT Short Term Goals Time to Complete Short Term Goals: 2 weeks PT Short Term Goal 1: I in HEP PT Short Term Goal 2: Pain has gone down 3 levels. PT Short Term Goal 3: Pt to be able to  stand for 10 minutes without increased pain, PT Short Term Goal 4: Pt to be walking 15 minutes at a time PT Long Term Goals Time to Complete Long Term Goals: 4 weeks PT Long Term Goal 1: I in advance HEP PT Long Term Goal 2: Pain level to be no greater than a 3/10 Long Term Goal 3: LE strength to be increased 1 grade to allow sit to stand without pain Long Term Goal 4: Pt to be wallking 20  minutes at a time.  PT Long Term Goal 5: Pt to be able to stand for 20 nminutes painfree PT Long Term Goal 6: LEFS to be 30 currently at 21  Problem List Patient Active Problem List  Diagnosis  . Back pain  . Bilateral anterior knee pain  . Knee pain, left  . Difficulty in walking  . Bilateral leg weakness    PT - End of Session Activity Tolerance: Patient tolerated treatment well PT Plan of Care PT Home Exercise Plan: given  GP Functional Assessment Tool Used: oswestry Functional Limitation: Mobility: Walking and moving around Mobility: Walking and Moving Around Current Status (W0981): At least 60 percent but less than 80 percent impaired, limited or restricted Mobility: Walking and Moving Around Goal Status [phone]): At least 20 percent but less than 40 percent impaired, limited or restricted  RUSSELL,CINDY 12/10/2012, 4:29 PM  Physician Documentation Your signature is required to indicate approval of the treatment plan as stated above.  Please sign and either send electronically or make a copy of this report for your files and return this physician signed original.   Please mark one 1.__approve of plan  2. ___approve of plan with the following conditions.   ______________________________                                                          _____________________ Physician Signature                                                                                                             Date

## 2012-12-12 ENCOUNTER — Ambulatory Visit (HOSPITAL_COMMUNITY)
Admission: RE | Admit: 2012-12-12 | Discharge: 2012-12-12 | Disposition: A | Discharge status: Home / Self Care | Payer: Medicare Other | Source: Ambulatory Visit | Attending: Internal Medicine | Admitting: Internal Medicine

## 2012-12-12 NOTE — Progress Notes (Signed)
Physical Therapy Treatment Patient Details  Name: Megan Odom MRN: [id number] Date of Birth: 08-Jan-1946  Today's Date: 12/12/2012 Time: 1478-2956 PT Time Calculation (min): 50 min  Visit#: 2 of 8  Re-eval: 01/09/13  charge:  There ex 30; traction  Authorization: [employer]  Subjective: Symptoms/Limitations Symptoms: [name] states traction decreased her leg pain.  Pt walked 10-15 minutes yesterday. Pain Assessment Currently in Pain?: Yes Pain Score:   5 (Pt states no leg pain today ) Pain Location: Back    Exercise/Treatments  Stretches Active Hamstring Stretch: 3 reps;30 seconds Single Knee to Chest Stretch: 3 reps;30 seconds Supine Clam: 10 reps Bent Knee Raise: 10 reps Bridge: 10 reps Sidelying Hip Abduction: 10 reps  Modalities Modalities: Traction Traction Type of Traction: Lumbar Max (lbs): 90 Hold Time: static Time: 15  Physical Therapy Assessment and Plan PT Assessment and Plan Clinical Impression Statement: PT performed stabilization exercises with verbal and manual cuing.  Pt has decreased leg pain with continued back pain.   Rehab Potential: Good PT Plan: begin prone exercises next treatment followed by wall pushups and wall squats .    Goals    Problem List Patient Active Problem List  Diagnosis  . Back pain  . Bilateral anterior knee pain  . Knee pain, left  . Difficulty in walking  . Bilateral leg weakness    PT - End of Session Activity Tolerance: Patient tolerated treatment well  GP    RUSSELL,CINDY 12/12/2012, 8:40 AM

## 2012-12-18 ENCOUNTER — Inpatient Hospital Stay (HOSPITAL_COMMUNITY): Admission: RE | Admit: 2012-12-18 | Payer: Medicare Other | Source: Ambulatory Visit | Admitting: *Deleted

## 2012-12-19 ENCOUNTER — Inpatient Hospital Stay (HOSPITAL_COMMUNITY): Admission: RE | Admit: 2012-12-19 | Payer: Medicare Other | Source: Ambulatory Visit | Admitting: Physical Therapy

## 2012-12-20 ENCOUNTER — Ambulatory Visit (HOSPITAL_COMMUNITY)
Admission: RE | Admit: 2012-12-20 | Discharge: 2012-12-20 | Disposition: A | Discharge status: Home / Self Care | Payer: Medicare Other | Source: Ambulatory Visit | Attending: Cardiovascular Disease | Admitting: Cardiovascular Disease

## 2012-12-20 VITALS — Ht 65.0 in | Wt 281.0 lb

## 2012-12-20 DIAGNOSIS — R5381 Other malaise: Secondary | ICD-10-CM | POA: Insufficient documentation

## 2012-12-20 DIAGNOSIS — R0602 Shortness of breath: Secondary | ICD-10-CM | POA: Insufficient documentation

## 2012-12-20 DIAGNOSIS — R002 Palpitations: Secondary | ICD-10-CM | POA: Insufficient documentation

## 2012-12-20 DIAGNOSIS — R079 Chest pain, unspecified: Secondary | ICD-10-CM

## 2012-12-20 DIAGNOSIS — R42 Dizziness and giddiness: Secondary | ICD-10-CM | POA: Insufficient documentation

## 2012-12-20 MED ORDER — AMINOPHYLLINE 25 MG/ML IV SOLN
75.0000 mg | Freq: Once | INTRAVENOUS | Status: AC
  Administered 2012-12-20: 75 mg via INTRAVENOUS

## 2012-12-20 MED ORDER — TECHNETIUM TC 99M SESTAMIBI GENERIC - CARDIOLITE: 306.0000 | Freq: Once | INTRAVENOUS | Status: DC | PRN

## 2012-12-20 MED ORDER — REGADENOSON 0.4 MG/5ML IV SOLN
0.4000 mg | Freq: Once | INTRAVENOUS | Status: AC
  Administered 2012-12-20: 0.4 mg via INTRAVENOUS

## 2012-12-20 MED ORDER — TECHNETIUM TC 99M SESTAMIBI GENERIC - CARDIOLITE
30.0000 | Freq: Once | INTRAVENOUS | Status: AC | PRN
  Administered 2012-12-20: 30 via INTRAVENOUS

## 2012-12-20 MED ORDER — TECHNETIUM TC 99M SESTAMIBI GENERIC - CARDIOLITE
11.0000 | Freq: Once | INTRAVENOUS | Status: AC | PRN
  Administered 2012-12-20: 11 via INTRAVENOUS

## 2012-12-20 NOTE — Procedures (Addendum)
[facility] [place] [facility] [address] [place] [address] [place] [address] [phone]  Cardiology Nuclear Med Study  Megan Odom is a 67 y.o. female     MRN : [id number]     DOB: 1946-07-27  Procedure Date: 12/20/2012  Nuclear Med Background Indication for Stress Test:  Evaluation for Ischemia History:  COPD Cardiac Risk Factors: Family History - CAD, Hypertension, Lipids, NIDDM, Obesity and Smoker  Symptoms:  Chest Pain, Dizziness, DOE, Fatigue, Light-Headedness, Palpitations and SOB   Nuclear Pre-Procedure Caffeine/Decaff Intake:  8:00pm NPO After: 6:00am   IV Site: R Antecubital  IV 0.9% NS with Angio Cath:  22g  Chest Size (in):  N/a  IV Started by: Emmit Pomfret, RN  Height: 5\' 5"  (1.651 m)  Cup Size: D  BMI:  Body mass index is 46.76 kg/(m^2). Weight:  281 lb (127.461 kg)   Tech Comments:  N/A     Nuclear Med Study 1 or 2 day study: 1 day  Stress Test Type:  Stress  Order Authorizing Provider:  Thurmon Fair, MD   Resting Radionuclide: Technetium 6m Sestamibi  Resting Radionuclide Dose: 11.0 mCi   Stress Radionuclide:  Technetium 56m Sestamibi  Stress Radionuclide Dose: 30.6 mCi           Stress Protocol Rest HR: 64 Stress HR: 89  Rest BP: 150/85 Stress BP: 126/75  Exercise Time (min): n/a METS: n/a   Predicted Max HR: 155 bpm % Max HR: 57.42 bpm Rate Pressure Product: 41324  Dose of Adenosine (mg):  n/a Dose of Lexiscan: 0.4 mg  Dose of Atropine (mg): n/a Dose of Dobutamine: n/a mcg/kg/min (at max HR)  Stress Test Technologist: Esperanza Sheets, CCT Nuclear Technologist: Gonzella Lex, CNMT   Rest Procedure:  Myocardial perfusion imaging was performed at rest 45 minutes following the intravenous administration of Technetium 3m Sestamibi. Stress Procedure:  The patient received IV Lexiscan 0.4 mg over 15-seconds.  Technetium 78m Sestamibi injected at 30-seconds.  The Patient experienced marked SOB and increase in a  pre-exisiting headache; 75 mg of IV Aminophylline was administered with resolution of symptoms.  There were no significant changes with Lexiscan.  Quantitative spect images were obtained after a 45 minute delay.  Transient Ischemic Dilatation (Normal <1.22):  0.74 Lung/Heart Ratio (Normal <0.45):  0.20 QGS EDV:  85 ml QGS ESV:  20 ml LV Ejection Fraction: 77%  Rest ECG: NSR - Normal EKG  Stress ECG: No significant change from baseline ECG  QPS Raw Data Images:  Normal; no motion artifact; normal heart/lung ratio. Stress Images:  Normal homogeneous uptake in all areas of the myocardium. Rest Images:  Small fixed septal defect likely artifact Subtraction (SDS):  No evidence of ischemia.  Impression Exercise Capacity:  Lexiscan with no exercise. BP Response:  Normal blood pressure response. Clinical Symptoms:  No significant symptoms noted. ECG Impression:  No significant ECG changes with Lexiscan. Comparison with Prior Nuclear Study: No previous nuclear study performed  Overall Impression:  Normal stress nuclear study.  LV Wall Motion:  NL LV Function; NL Wall Motion; EF 77%.  Chrystie Nose, MD, [facility] Board Certified in Nuclear Cardiology Attending Cardiologist [facility]  Chrystie Nose, MD  12/20/2012 1:33 PM

## 2012-12-20 NOTE — Progress Notes (Signed)
2D Echo Performed 12/20/2012    Quina Wilbourne, RCS  

## 2012-12-24 ENCOUNTER — Ambulatory Visit (HOSPITAL_COMMUNITY): Payer: Medicare Other | Admitting: *Deleted

## 2012-12-25 ENCOUNTER — Ambulatory Visit (HOSPITAL_COMMUNITY)
Admission: RE | Admit: 2012-12-25 | Discharge: 2012-12-25 | Disposition: A | Discharge status: Home / Self Care | Payer: Medicare Other | Source: Ambulatory Visit | Attending: Internal Medicine | Admitting: Internal Medicine

## 2012-12-25 DIAGNOSIS — M545 Low back pain, unspecified: Secondary | ICD-10-CM | POA: Insufficient documentation

## 2012-12-25 DIAGNOSIS — M6281 Muscle weakness (generalized): Secondary | ICD-10-CM | POA: Insufficient documentation

## 2012-12-25 DIAGNOSIS — R262 Difficulty in walking, not elsewhere classified: Secondary | ICD-10-CM | POA: Insufficient documentation

## 2012-12-25 DIAGNOSIS — IMO0001 Reserved for inherently not codable concepts without codable children: Secondary | ICD-10-CM | POA: Insufficient documentation

## 2012-12-25 DIAGNOSIS — M79609 Pain in unspecified limb: Secondary | ICD-10-CM | POA: Insufficient documentation

## 2012-12-25 NOTE — Progress Notes (Signed)
Physical Therapy Treatment Patient Details  Name: Megan Odom MRN: [id number] Date of Birth: May 22, 1946  Today's Date: 12/25/2012 Time: 0835-0930 PT Time Calculation (min): 55 min Charge: therex 38', lumbar traction x 1  Visit#: 3 of 8  Re-eval: 01/09/13    Authorization: [employer]  Authorization Time Period:    Authorization Visit#:   of     Subjective: Symptoms/Limitations Symptoms: Pt woke up with knees in flexion this morning and reported legs gave up on her, had to stabilize herself with arms.  Reported pain scale 6/10 Pain Assessment Currently in Pain?: Yes Pain Score:   6 Pain Location: Leg Pain Orientation: Right;Left  Objective:   Exercise/Treatments Stretches Active Hamstring Stretch: 2 reps;30 seconds;Limitations Active Hamstring Stretch Limitations: with towel ASupine Bent Knee Raise: 15 reps Bridge: 15 reps Sidelying Clam: 15 reps Hip Abduction: 15 reps Prone  Straight Leg Raise: 10 reps Other Prone Lumbar Exercises: heel squeeze 5x 10"  Modalities Modalities: Traction Traction Type of Traction: Lumbar Max (lbs): 100 Hold Time: static Time: 15  Physical Therapy Assessment and Plan PT Assessment and Plan Clinical Impression Statement: Added prone exercises for gluteal strengthening, pt required multimodal cueing for stabilty and technique with therex exercises.  Pt reported decrased in leg pain following therex exercises.  Ended session with lumbar traction for radicular pain relief. PT Plan: Next session begin wall pushups and wall squats .    Goals    Problem List Patient Active Problem List   Diagnosis Date Noted  . Difficulty in walking 02/17/2012  . Bilateral leg weakness 02/17/2012  . Back pain 02/16/2012  . Bilateral anterior knee pain 02/16/2012  . Knee pain, left 02/16/2012    PT - End of Session Activity Tolerance: Patient tolerated treatment well General Behavior During Therapy: [facility] for tasks  assessed/performed Cognition: WFL for tasks performed  GP    Juel Burrow 12/25/2012, 9:12 AM

## 2012-12-26 ENCOUNTER — Ambulatory Visit (HOSPITAL_COMMUNITY)
Admission: RE | Admit: 2012-12-26 | Discharge: 2012-12-26 | Disposition: A | Discharge status: Home / Self Care | Payer: Medicare Other | Source: Ambulatory Visit | Attending: Internal Medicine | Admitting: Internal Medicine

## 2012-12-26 NOTE — Progress Notes (Signed)
Physical Therapy Treatment Patient Details  Name: Megan Odom MRN: [id number] Date of Birth: Jul 26, 1946  Today's Date: 12/26/2012 Time: 0800-0845 PT Time Calculation (min): 45 min  Visit#: 4 of 8  Re-eval: 01/09/13 Authorization: [employer]  Charges:  therex 24', traction 15'  Subjective: Symptoms/Limitations Symptoms: Pt states her back is feeling much better; her B hips still hurt, 7/10. Pain Assessment Currently in Pain?: Yes Pain Score:   7 Pain Location: Hip Pain Orientation: Right;Left   Exercise/Treatments Standing Wall Slides: 10 reps;5 seconds Other Standing Lumbar Exercises: wall push ups 10X Sidelying Clam: 15 reps Hip Abduction: 15 reps Prone  Straight Leg Raise: 10 reps Other Prone Lumbar Exercises: heel squeeze 10x 10"    Modalities Modalities: Traction Traction Type of Traction: Lumbar Min (lbs): n/a Max (lbs): 110 (pt. weighs 289#) Hold Time: static Rest Time: n/a Time: 15  Physical Therapy Assessment and Plan PT Assessment and Plan Clinical Impression Statement: Pt. with continued need for cues to perform exercises in proper form without substitution.  Overall reduction in bilateral hip pain at end of session.  Added wall push ups/wallslides and Increased traction 10# today with good results.  Unable to complete all exercises due to patient having to leave and pick up daughter by 9am. PT Plan: Continue to progress; may increase traction with max value of 140#.     PT - End of Session Activity Tolerance: Patient tolerated treatment well General Behavior During Therapy: WFL for tasks assessed/performed Cognition: WFL for tasks performed   Megan Odom, PTA/CLT 12/26/2012, 8:36 AM

## 2012-12-31 ENCOUNTER — Ambulatory Visit (HOSPITAL_COMMUNITY): Payer: Medicare Other | Admitting: Physical Therapy

## 2013-01-02 ENCOUNTER — Telehealth (HOSPITAL_COMMUNITY): Payer: Self-pay

## 2013-01-02 ENCOUNTER — Ambulatory Visit (HOSPITAL_COMMUNITY): Payer: Medicare Other | Admitting: Physical Therapy

## 2013-01-03 ENCOUNTER — Ambulatory Visit (HOSPITAL_COMMUNITY)
Admission: RE | Admit: 2013-01-03 | Discharge: 2013-01-03 | Disposition: A | Discharge status: Home / Self Care | Payer: Medicare Other | Source: Ambulatory Visit | Attending: Internal Medicine | Admitting: Internal Medicine

## 2013-01-03 NOTE — Progress Notes (Signed)
Physical Therapy Treatment Patient Details  Name: Megan Odom MRN: [id number] Date of Birth: 05-16-1946  Today's Date: 01/03/2013 Time: 6213-0865 PT Time Calculation (min): 55 min  Visit#: 5 of 8  Re-eval: 01/09/13 Charges: Therex x 28' Traction x 1  Authorization: [employer]     Subjective: Symptoms/Limitations Symptoms: Pt states that traction is decreasing her pain. Pain Assessment Currently in Pain?: Yes Pain Score:   6 Pain Location: Back Pain Orientation: Right;Left Pain Radiating Towards: Right hip   Exercise/Treatments Standing Wall Slides: 10 reps;5 seconds Other Standing Lumbar Exercises: Wall plank 5x10" Sidelying Clam: 15 reps Hip Abduction: 15 reps Prone  Straight Leg Raise: 10 reps Other Prone Lumbar Exercises: heel squeeze 10x 10" Other Prone Lumbar Exercises: Glute sets 10x10"  Modalities Modalities: Traction Traction Type of Traction: Lumbar Min (lbs): n/a Max (lbs): 115 (pt. weighs 289#) Hold Time: static Rest Time: n/a Time: 15  Physical Therapy Assessment and Plan PT Assessment and Plan Clinical Impression Statement: Pt completes therex well after initial cueing and demo. Pt displays most significant weakness in bilateral gluteal. Pt requires multimodal cueing to avoid using lumbar musculature with prone SLR. Traction continued secondary to beneficial results with previous sessions. PT Plan: Continue to progress; may increase traction with max value of 140#.     Problem List Patient Active Problem List   Diagnosis Date Noted  . Difficulty in walking 02/17/2012  . Bilateral leg weakness 02/17/2012  . Back pain 02/16/2012  . Bilateral anterior knee pain 02/16/2012  . Knee pain, left 02/16/2012    PT - End of Session Activity Tolerance: Patient tolerated treatment well General Behavior During Therapy: [facility] for tasks assessed/performed Cognition: [facility] for tasks performed  Seth Bake, PTA  01/03/2013, 11:03 AM

## 2013-01-07 ENCOUNTER — Ambulatory Visit (HOSPITAL_COMMUNITY): Payer: Medicare Other | Admitting: *Deleted

## 2013-01-08 ENCOUNTER — Ambulatory Visit (HOSPITAL_COMMUNITY)
Admission: RE | Admit: 2013-01-08 | Discharge: 2013-01-08 | Disposition: A | Discharge status: Home / Self Care | Payer: Medicare Other | Source: Ambulatory Visit | Attending: Internal Medicine | Admitting: Internal Medicine

## 2013-01-08 NOTE — Evaluation (Signed)
Physical Therapy Reassessment Patient Details  Name: Megan Odom MRN: [id number] Date of Birth: [birth year]  Today's Date: 01/08/2013 Time: 0800-0855 PT Time Calculation (min): 55 min              Visit#: 6 of 8  Re-eval: 01/09/13 Charges: Traction x 1 MMT x 1 ROMM x 1 Self care x 20'  Authorization: [employer]     Past Medical History:  Past Medical History  Diagnosis Date  . Hypertension   . Asthma   . Arthritis   . Hep C w/o coma, chronic    Past Surgical History:  Past Surgical History  Procedure Laterality Date  . Cholecystectomy    . Appendectomy    . Abdominal hysterectomy    . Breast surgery      Subjective Symptoms/Limitations Symptoms: Pt states that she feels like therapy is helping her pain. Pain Assessment Currently in Pain?: Yes Pain Score:   6 Pain Location: Hip Pain Orientation: Right;Posterior   Sensation/Coordination/Flexibility/Functional Tests Functional Tests Functional Tests: LEFS 26/80 (was 26/80)  Assessment RLE Strength Right Hip Flexion: 4/5 (was 4/5) Right Hip Extension: 3+/5 (was 3/5) Right Hip ABduction: 3+/5 (was 3+/5) Right Hip ADduction: 3/5 (was 3/5) Right Knee Flexion: 5/5 (was 3+/5) Right Knee Extension: 4/5 (was 4/5) Right Ankle Dorsiflexion: 5/5 (was 4+/5) LLE Strength Left Hip Flexion: 4/5 (was 4/5) Left Hip Extension: 3+/5 (was 3/5) Left Hip ABduction: 4/5 (was 4/5) Left Hip ADduction: 4/5 (was 4/5) Left Knee Flexion: 5/5 (was 3+/5) Left Knee Extension: 4/5 (was 4/5) Lumbar AROM Lumbar Flexion: wnl Lumbar Extension: wnl Lumbar - Right Side Bend: wnl Lumbar - Left Side Bend: wnl Lumbar - Right Rotation: wnl Lumbar - Left Rotation: wnl  Exercise/Treatments  Modalities Modalities: Traction Traction Type of Traction: Lumbar Min (lbs): n/a Max (lbs): 125 (pt. weighs 289#) Hold Time: static Rest Time: n/a Time: 15  Physical Therapy Assessment and Plan PT Assessment and Plan Clinical Impression  Statement: Pt presents with decreased pain but minimal improvements in strength. Pt's LEFS improved 4 points. Educated pt on the importance of completing HEP to improve her strength. Pt states that she will be diligent with HEP. Pt feels that therapy has been beneficial and would like to continue PT to improve remaining deficits. PT Plan: Recommend to continue PT 2 times a week x 4 weeks to improve remaining deficits.     Goals Home Exercise Program Pt will Perform Home Exercise Program: Independently PT Short Term Goals Time to Complete Short Term Goals: 2 weeks PT Short Term Goal 1: I in HEP PT Short Term Goal 1 - Progress: Met PT Short Term Goal 2: Pain has gone down 3 levels. PT Short Term Goal 2 - Progress: Met PT Short Term Goal 3: Pt to be able to stand for 10 minutes without increased pain, PT Short Term Goal 3 - Progress: Met PT Short Term Goal 4: Pt to be walking 15 minutes at a time PT Short Term Goal 4 - Progress: Met PT Long Term Goals Time to Complete Long Term Goals: 4 weeks PT Long Term Goal 1: I in advance HEP PT Long Term Goal 1 - Progress: Met PT Long Term Goal 2: Pain level to be no greater than a 3/10 PT Long Term Goal 2 - Progress: Progressing toward goal Long Term Goal 3: LE strength to be increased 1 grade to allow sit to stand without pain Long Term Goal 3 Progress: Progressing toward goal Long Term Goal 4: Pt to  be wallking 20 minutes at a time.  Long Term Goal 4 Progress: Progressing toward goal PT Long Term Goal 5: Pt to be able to stand for 20 nminutes painfree Long Term Goal 5 Progress: Progressing toward goal PT Long Term Goal 6: LEFS to be 30 currently at 21 (Met)  Problem List Patient Active Problem List   Diagnosis Date Noted  . Difficulty in walking 02/17/2012  . Bilateral leg weakness 02/17/2012  . Back pain 02/16/2012  . Bilateral anterior knee pain 02/16/2012  . Knee pain, left 02/16/2012    PT - End of Session Activity Tolerance: Patient  tolerated treatment well General Behavior During Therapy: [facility] for tasks assessed/performed Cognition: WFL for tasks performed  Seth Bake, PTA  01/08/2013, 9:22 AM  Physician Documentation Your signature is required to indicate approval of the treatment plan as stated above.  Please sign and either send electronically or make a copy of this report for your files and return this physician signed original.   Please mark one 1.__approve of plan  2. ___approve of plan with the following conditions.   ______________________________                                                          _____________________ Physician Signature                                                                                                             Date

## 2013-01-09 ENCOUNTER — Encounter: Payer: Self-pay | Admitting: *Deleted

## 2013-01-10 ENCOUNTER — Ambulatory Visit (HOSPITAL_COMMUNITY)
Admission: RE | Admit: 2013-01-10 | Discharge: 2013-01-10 | Disposition: A | Discharge status: Home / Self Care | Payer: Medicare Other | Source: Ambulatory Visit | Attending: Internal Medicine | Admitting: Internal Medicine

## 2013-01-10 NOTE — Progress Notes (Signed)
Physical Therapy Treatment Patient Details  Name: Megan Odom MRN: [id number] Date of Birth: 1945/09/16  Today's Date: 01/10/2013 Time: 4098-1191 PT Time Calculation (min): 42 min Visit#: 7 of 16  Re-eval: 01/09/13 Charges:  therex 25', traction 15'   Subjective: Symptoms/Limitations Symptoms: Pt. states her pain is decreasing.  Only having 2-3/10 pain today in her LB without radiculopathy.   Exercise/Treatments Standing Wall Slides: 10 reps;5 seconds Other Standing Lumbar Exercises: Wall plank 5x10" Sidelying Clam: 15 reps Hip Abduction: 15 reps Prone  Straight Leg Raise: 10 reps Other Prone Lumbar Exercises: heel squeeze 15x 10" Other Prone Lumbar Exercises: Glute sets 15x10"   Modalities Modalities: Traction Traction Type of Traction: Lumbar Min (lbs): n/a Max (lbs): 125 (pt. weighs 289#) Hold Time: static Rest Time: n/a Time: 15  Physical Therapy Assessment and Plan PT Assessment and Plan Clinical Impression Statement: Pt continues to require verbal and tactile cues to perform exercises correctly.  Pt. states she feels she is improving with overall symptom reduction.   PT Plan: Continue to progress towards goals.  Add abdominal ball activities.     Problem List Patient Active Problem List   Diagnosis Date Noted  . Difficulty in walking 02/17/2012  . Bilateral leg weakness 02/17/2012  . Back pain 02/16/2012  . Bilateral anterior knee pain 02/16/2012  . Knee pain, left 02/16/2012    PT - End of Session Activity Tolerance: Patient tolerated treatment well General Behavior During Therapy: [facility] for tasks assessed/performed Cognition: WFL for tasks performed   Lurena Nida, PTA/CLT 01/10/2013, 9:15 AM

## 2013-01-15 ENCOUNTER — Ambulatory Visit (HOSPITAL_COMMUNITY)
Admission: RE | Admit: 2013-01-15 | Discharge: 2013-01-15 | Disposition: A | Discharge status: Home / Self Care | Payer: Medicare Other | Source: Ambulatory Visit | Attending: Internal Medicine | Admitting: Internal Medicine

## 2013-01-15 ENCOUNTER — Encounter: Payer: Self-pay | Admitting: Cardiovascular Disease

## 2013-01-15 NOTE — Progress Notes (Signed)
Physical Therapy Treatment Patient Details  Name: Megan Odom MRN: [id number] Date of Birth: Apr 24, 1946  Today's Date: 01/15/2013 Time: 4098-1191 PT Time Calculation (min): 54 min Charge: therex 30', Traction x 1  Visit#: 8 of 16  Re-eval: 02/05/13    Authorization: [employer]  Authorization Time Period:    Authorization Visit#:   of     Subjective: Symptoms/Limitations Symptoms: Pt stated pain free this session Pain Assessment Currently in Pain?: No/denies  Objective:   Exercise/Treatments Standing Wall Slides: 10 reps;5 seconds Other Standing Lumbar Exercises: Wall plank 5x10" Other Standing Lumbar Exercises: wall push-ups 10x Supine Large Ball Abdominal Isometric: 5 reps;5 seconds Large Ball Oblique Isometric: 5 reps;5 seconds Prone  Single Arm Raise: 10 reps Straight Leg Raise: 10 reps Other Prone Lumbar Exercises: heel squeeze 15x 10" Other Prone Lumbar Exercises: Glute sets 15x10"  Physical Therapy Assessment and Plan PT Assessment and Plan Clinical Impression Statement: Added wall pushups for stability strengthening and progressed core strengthening with therapeutic ball for rectus abdominus and oblique strenghtening.  Pt continues to require verbal and tactile cueing to perform exercises ciorrectly.  Pt reported she has increased ease with bed mobility activities without increased pain. PT Plan: Continue to progress towards goals.      Goals    Problem List Patient Active Problem List   Diagnosis Date Noted  . Difficulty in walking 02/17/2012  . Bilateral leg weakness 02/17/2012  . Back pain 02/16/2012  . Bilateral anterior knee pain 02/16/2012  . Knee pain, left 02/16/2012       GP    Juel Burrow 01/15/2013, 9:18 AM

## 2013-01-15 NOTE — Progress Notes (Signed)
Physical Therapy Treatment Patient Details  Name: Megan Odom MRN: [id number] Date of Birth: 1946/08/13  Today's Date: 01/15/2013 Time: 5284-1324 PT Time Calculation (min): 54 min Charge; Therex x 30', traction x 17'  Visit#: 8 of 16  Re-eval: 02/05/13    Authorization: [employer]  Authorization Time Period:    Authorization Visit#:   of     Subjective: Symptoms/Limitations Symptoms: Pt stated pain free this session Pain Assessment Currently in Pain?: No/denies  Objective:   Exercise/Treatments Standing Wall Slides: 10 reps;5 seconds Other Standing Lumbar Exercises: Wall plank 5x10" Other Standing Lumbar Exercises: wall push-ups 10x Supine Large Ball Abdominal Isometric: 5 reps;5 seconds Large Ball Oblique Isometric: 5 reps;5 seconds Prone  Single Arm Raise: 10 reps Straight Leg Raise: 10 reps Other Prone Lumbar Exercises: heel squeeze 15x 10" Other Prone Lumbar Exercises: Glute sets 15x10"  Modalities Modalities: Traction Traction Type of Traction: Lumbar Min (lbs): n/a Max (lbs): 125 (pt. weighs 289#) Hold Time: static Time: 15  Physical Therapy Assessment and Plan PT Assessment and Plan Clinical Impression Statement: Added wall pushups for stability strengthening and progressed core strengthening with therapeutic ball for rectus abdominus and oblique strenghtening.  Pt continues to require verbal and tactile cueing to perform exercises ciorrectly.  Pt reported she has increased ease with bed mobility activities without increased pain. PT Plan: Continue to progress towards goals.      Goals    Problem List Patient Active Problem List   Diagnosis Date Noted  . Difficulty in walking 02/17/2012  . Bilateral leg weakness 02/17/2012  . Back pain 02/16/2012  . Bilateral anterior knee pain 02/16/2012  . Knee pain, left 02/16/2012       GP    Juel Burrow 01/15/2013, 9:57 AM

## 2013-01-16 ENCOUNTER — Encounter: Payer: Self-pay | Admitting: Cardiovascular Disease

## 2013-01-16 ENCOUNTER — Ambulatory Visit (INDEPENDENT_AMBULATORY_CARE_PROVIDER_SITE_OTHER): Payer: Medicare Other | Admitting: Cardiovascular Disease

## 2013-01-16 VITALS — BP 134/78 | HR 64 | Ht 65.5 in | Wt 278.0 lb

## 2013-01-16 DIAGNOSIS — G4733 Obstructive sleep apnea (adult) (pediatric): Secondary | ICD-10-CM

## 2013-01-16 DIAGNOSIS — I471 Supraventricular tachycardia: Secondary | ICD-10-CM

## 2013-01-16 DIAGNOSIS — R079 Chest pain, unspecified: Secondary | ICD-10-CM

## 2013-01-16 DIAGNOSIS — I10 Essential (primary) hypertension: Secondary | ICD-10-CM

## 2013-01-16 MED ORDER — VERAPAMIL HCL ER 120 MG PO TBCR: 120.0000 mg | EXTENDED_RELEASE_TABLET | Freq: Every day | ORAL | Status: DC

## 2013-01-16 NOTE — Progress Notes (Signed)
Patient ID: Megan Odom, female   DOB: [birth year], 67 y.o.   MRN: [id number]   Reason for office visit  Megan Odom is a morbidly obese 67 year old woman returns in followup for evaluation of chest pain and palpitations. Her myocardial perfusion study was normal, without any indication of coronary insufficiency. Her event monitor showed evidence of paroxysmal atrial tachycardia but no evidence of atrial fibrillation or significant ventricular arrhythmia. We switched from amlodipine to verapamil and her palpitations have improved. During the last several days of wearing the event monitor while on verapamil therapy no significant atrial arrhythmias were noted.  On review of systems she has multiple findings suggestive of probable obstructive sleep apnea. She is a very loud snorer. She wakes up feeling tired. She takes "cat naps" during the day. She has fallen asleep waiting at a red light while driving. She has fallen asleep during conversations.    Allergies  Allergen Reactions  . Darvocet (Propoxyphene-Acetaminophen) Other (See Comments)    Hurts ears and side of face   . Latex Swelling  . Penicillins Other (See Comments)    Hurts ears and side of face   . Tetracyclines & Related Other (See Comments)    Hurts ears and side of face  . Adhesive (Tape) Itching and Rash    Telemetry pads cause itching and rash at site    Current Outpatient Prescriptions  Medication Sig Dispense Refill  . albuterol (PROVENTIL HFA;VENTOLIN HFA) 108 (90 BASE) MCG/ACT inhaler Inhale 2 puffs into the lungs every 6 (six) hours as needed. Shortness of breath       . albuterol (PROVENTIL) (2.5 MG/3ML) 0.083% nebulizer solution Take 2.5 mg by nebulization every 6 (six) hours as needed. For shortness of breath      . aspirin EC 81 MG tablet Take 81 mg by mouth daily.        . beclomethasone (QVAR) 40 MCG/ACT inhaler Inhale 2 puffs into the lungs 2 (two) times daily.        . benzonatate (TESSALON) 100 MG  capsule Take 200 mg by mouth 3 (three) times daily as needed for cough.      . fluticasone (FLONASE) 50 MCG/ACT nasal spray Place 2 sprays into the nose daily.        . furosemide (LASIX) 40 MG tablet Take 40 mg by mouth daily.        Marland Kitchen HYDROcodone-acetaminophen (NORCO/VICODIN) 5-325 MG per tablet Take 1 tablet by mouth every 4 (four) hours as needed for pain.  12 tablet  0  . ibuprofen (ADVIL,MOTRIN) 800 MG tablet Take 800 mg by mouth every 8 (eight) hours as needed. Pain        . loratadine (CLARITIN) 10 MG tablet Take 10 mg by mouth daily.       Marland Kitchen losartan-hydrochlorothiazide (HYZAAR) 100-25 MG per tablet Take 1 tablet by mouth daily.      Marland Kitchen omeprazole (PRILOSEC) 40 MG capsule Take 40 mg by mouth 2 (two) times daily.        Marland Kitchen PARoxetine (PAXIL) 20 MG tablet Take 20 mg by mouth 2 (two) times daily.       . potassium chloride SA (K-DUR,KLOR-CON) 20 MEQ tablet Take 10 mEq by mouth daily.       . verapamil (CALAN-SR) 120 MG CR tablet Take 1 tablet (120 mg total) by mouth at bedtime.  90 tablet  3  . Vitamin D, Ergocalciferol, (DRISDOL) 50000 UNITS CAPS Take 50,000 Units by mouth every 7 (seven) days.      Marland Kitchen  amLODipine (NORVASC) 5 MG tablet Take 5 mg by mouth daily.        . valsartan-hydrochlorothiazide (DIOVAN-HCT) 160-12.5 MG per tablet Take 1 tablet by mouth daily.         No current facility-administered medications for this visit.    Past Medical History  Diagnosis Date  . Hypertension   . Asthma   . Arthritis   . Hep C w/o coma, chronic   . Type 2 diabetes mellitus   . Hyperlipemia     Past Surgical History  Procedure Laterality Date  . Cholecystectomy    . Appendectomy    . Abdominal hysterectomy    . Breast surgery      Family History  Problem Relation Age of Onset  . Arthritis    . Cancer    . Asthma    . Diabetes      History   Social History  . Marital Status: Widowed    Spouse Name: N/A    Number of Children: N/A  . Years of Education: GED    Occupational History  .     Social History Main Topics  . Smoking status: Current Every Day Smoker -- 0.50 packs/day for 50 years  . Smokeless tobacco: Not on file     Comment: smoking for the past 50 years  . Alcohol Use: No  . Drug Use: No  . Sexually Active: Yes    Birth Control/ Protection: Surgical   Other Topics Concern  . Not on file   Social History Narrative  . No narrative on file    Review of systems: The patient specifically denies any chest pain with exertion, dyspnea at rest, but does have class II dyspnea on exertion; she denies orthopnea, paroxysmal nocturnal dyspnea, syncope, palpitations, focal neurological deficits, intermittent claudication, lower extremity edema, unexplained weight gain, cough, hemoptysis or wheezing.   PHYSICAL EXAM BP 134/78  Pulse 64  Ht 5' 5.5" (1.664 m)  Wt 278 lb (126.1 kg)  BMI 45.54 kg/m2 Her head exam is normal.  Pupils are equal, round and reactive. The extraocular movements are intact. The mouth, ears, nose and the oropharynx are generally within normal limits, but there is a rather crowded airway suggestive of a Mallampati score of about 3.  The neck is supple. Jugular venous pulsations cannot be seen due to obesity. There are normal carotid pulses, and no carotid bruits are present. She does have very prominent acanthosis.  The lungs are clear to auscultation bilaterally with symmetrical respiratory excursions and no evidence of abnormal fremitus, dullness to percussion, egophony or other signs of consolidation.  The cardiovascular exam shows a normal position and quality of the apical impulse, regular rate and rhythm, normal 1st and 2nd heart sounds without any murmurs, rubs, or gallops.  The abdomen is soft, nontender, nondistended without any palpable masses, abnormal pulsatility or flank bruits, but the examination is seriously limited by her extreme obesity.  Her extremities show no clubbing or cyanosis. She does have 2+  edema of the ankles bilaterally. Pulses are 2+ symmetrically in the brachials radials ulnar is dorsalis pedis and posterior tibials bilaterally; the popliteals and femorals are very difficult to locate; there are no bruits overlying the subclavian or femoral arteries The neurological exam is grossly nonfocal.  EKG: Sinus rhythm, normal  Nuclear perfusion images were reviewed as were the event monitor rhythm strips  Lipid Panel  No results found for this basename: chol, trig, hdl, cholhdl, vldl, ldlcalc    BMET  Component Value Date/Time   NA 135 11/08/2012 1344   K 3.1* 11/08/2012 1344   CL 103 11/08/2012 1344   CO2 21 11/08/2012 1344   GLUCOSE 135* 11/08/2012 1344   BUN 15 11/08/2012 1344   CREATININE 0.69 11/08/2012 1344   CALCIUM 8.5 11/08/2012 1344   GFRNONAA >90 11/08/2012 1344   GFRAA >90 11/08/2012 1344     ASSESSMENT AND PLAN  Paroxysmal atrial tachycardia This appears to have responded well to treatment with verapamil. There is plenty of room to increase the dose of verapamil if necessary for symptom control. If this also is a drop in blood pressure we could reduce the doses of her other antihypertensives.  I suspect that the underlying pathophysiology for the arrhythmia is represented by obstructive sleep apnea and right heart strain.  Sleep apnea, obstructive Judging by her symptom complex, body habitus and the type of arrhythmia that she has demonstrated, I think that this diagnosis is highly likely. I have made arrangements for a split-night sleep study.  We also discussed the adverse long-term effects of untreated obstructive sleep apnea and reviewed the likely benefits that significant weight loss would have on this disorder and its complications.  Morbid obesity We discussed healthy diet changes, caloric restriction and physical exercise in detail.  Chest pain at rest With a normal nuclear perfusion study I would not pursue a diagnosis of coronary disease any  further. I think it is most likely that her symptoms are gastroesophageal in nature since there are other features to suggest significant acid reflux (as noted by her ENT physician).   Thurmon Fair, MD, [facility] [phone] office [phone] pager

## 2013-01-16 NOTE — Patient Instructions (Signed)
Your physician has recommended that you have a sleep study. This test records several body functions during sleep, including: brain activity, eye movement, oxygen and carbon dioxide blood levels, heart rate and rhythm, breathing rate and rhythm, the flow of air through your mouth and nose, snoring, body muscle movements, and chest and belly movement.  

## 2013-01-17 ENCOUNTER — Ambulatory Visit (HOSPITAL_COMMUNITY): Payer: Medicare Other | Admitting: Physical Therapy

## 2013-01-17 DIAGNOSIS — I471 Supraventricular tachycardia, unspecified: Secondary | ICD-10-CM | POA: Insufficient documentation

## 2013-01-17 DIAGNOSIS — G4733 Obstructive sleep apnea (adult) (pediatric): Secondary | ICD-10-CM | POA: Insufficient documentation

## 2013-01-17 DIAGNOSIS — R079 Chest pain, unspecified: Secondary | ICD-10-CM | POA: Insufficient documentation

## 2013-01-17 DIAGNOSIS — I10 Essential (primary) hypertension: Secondary | ICD-10-CM | POA: Insufficient documentation

## 2013-01-17 NOTE — Assessment & Plan Note (Signed)
We discussed healthy diet changes, caloric restriction and physical exercise in detail.

## 2013-01-17 NOTE — Assessment & Plan Note (Signed)
Judging by her symptom complex, body habitus and the type of arrhythmia that she has demonstrated, I think that this diagnosis is highly likely. I have made arrangements for a split-night sleep study.  We also discussed the adverse long-term effects of untreated obstructive sleep apnea and reviewed the likely benefits that significant weight loss would have on this disorder and its complications.

## 2013-01-17 NOTE — Assessment & Plan Note (Signed)
With a normal nuclear perfusion study I would not pursue a diagnosis of coronary disease any further. I think it is most likely that her symptoms are gastroesophageal in nature since there are other features to suggest significant acid reflux (as noted by her ENT physician).

## 2013-01-17 NOTE — Assessment & Plan Note (Signed)
This appears to have responded well to treatment with verapamil. There is plenty of room to increase the dose of verapamil if necessary for symptom control. If this also is a drop in blood pressure we could reduce the doses of her other antihypertensives.  I suspect that the underlying pathophysiology for the arrhythmia is represented by obstructive sleep apnea and right heart strain.

## 2013-01-21 ENCOUNTER — Ambulatory Visit (HOSPITAL_COMMUNITY): Payer: Medicare Other | Admitting: Physical Therapy

## 2013-01-24 ENCOUNTER — Ambulatory Visit (HOSPITAL_COMMUNITY)
Admission: RE | Admit: 2013-01-24 | Discharge: 2013-01-24 | Disposition: A | Discharge status: Home / Self Care | Payer: Medicare Other | Source: Ambulatory Visit | Attending: Neurology | Admitting: Neurology

## 2013-01-24 ENCOUNTER — Emergency Department (HOSPITAL_COMMUNITY): Payer: Medicare Other

## 2013-01-24 ENCOUNTER — Emergency Department (HOSPITAL_COMMUNITY)
Admission: EM | Admit: 2013-01-24 | Discharge: 2013-01-24 | Disposition: A | Discharge status: Home / Self Care | Payer: Medicare Other | Attending: Emergency Medicine | Admitting: Emergency Medicine

## 2013-01-24 ENCOUNTER — Encounter (HOSPITAL_COMMUNITY): Payer: Self-pay | Admitting: Emergency Medicine

## 2013-01-24 DIAGNOSIS — R51 Headache: Secondary | ICD-10-CM | POA: Insufficient documentation

## 2013-01-24 DIAGNOSIS — Z79899 Other long term (current) drug therapy: Secondary | ICD-10-CM | POA: Insufficient documentation

## 2013-01-24 DIAGNOSIS — M545 Low back pain, unspecified: Secondary | ICD-10-CM | POA: Insufficient documentation

## 2013-01-24 DIAGNOSIS — Z91040 Latex allergy status: Secondary | ICD-10-CM | POA: Insufficient documentation

## 2013-01-24 DIAGNOSIS — Z8639 Personal history of other endocrine, nutritional and metabolic disease: Secondary | ICD-10-CM | POA: Insufficient documentation

## 2013-01-24 DIAGNOSIS — M129 Arthropathy, unspecified: Secondary | ICD-10-CM | POA: Insufficient documentation

## 2013-01-24 DIAGNOSIS — F329 Major depressive disorder, single episode, unspecified: Secondary | ICD-10-CM | POA: Insufficient documentation

## 2013-01-24 DIAGNOSIS — Z8619 Personal history of other infectious and parasitic diseases: Secondary | ICD-10-CM | POA: Insufficient documentation

## 2013-01-24 DIAGNOSIS — R209 Unspecified disturbances of skin sensation: Secondary | ICD-10-CM | POA: Insufficient documentation

## 2013-01-24 DIAGNOSIS — R110 Nausea: Secondary | ICD-10-CM | POA: Insufficient documentation

## 2013-01-24 DIAGNOSIS — J45909 Unspecified asthma, uncomplicated: Secondary | ICD-10-CM | POA: Insufficient documentation

## 2013-01-24 DIAGNOSIS — M79621 Pain in right upper arm: Secondary | ICD-10-CM

## 2013-01-24 DIAGNOSIS — Z880 Allergy status to penicillin: Secondary | ICD-10-CM | POA: Insufficient documentation

## 2013-01-24 DIAGNOSIS — M79609 Pain in unspecified limb: Secondary | ICD-10-CM | POA: Insufficient documentation

## 2013-01-24 DIAGNOSIS — Z7982 Long term (current) use of aspirin: Secondary | ICD-10-CM | POA: Insufficient documentation

## 2013-01-24 DIAGNOSIS — IMO0001 Reserved for inherently not codable concepts without codable children: Secondary | ICD-10-CM | POA: Insufficient documentation

## 2013-01-24 DIAGNOSIS — R262 Difficulty in walking, not elsewhere classified: Secondary | ICD-10-CM | POA: Insufficient documentation

## 2013-01-24 DIAGNOSIS — I10 Essential (primary) hypertension: Secondary | ICD-10-CM | POA: Insufficient documentation

## 2013-01-24 DIAGNOSIS — IMO0002 Reserved for concepts with insufficient information to code with codable children: Secondary | ICD-10-CM | POA: Insufficient documentation

## 2013-01-24 DIAGNOSIS — Z87440 Personal history of urinary (tract) infections: Secondary | ICD-10-CM | POA: Insufficient documentation

## 2013-01-24 DIAGNOSIS — Z862 Personal history of diseases of the blood and blood-forming organs and certain disorders involving the immune mechanism: Secondary | ICD-10-CM | POA: Insufficient documentation

## 2013-01-24 DIAGNOSIS — F3289 Other specified depressive episodes: Secondary | ICD-10-CM | POA: Insufficient documentation

## 2013-01-24 DIAGNOSIS — E119 Type 2 diabetes mellitus without complications: Secondary | ICD-10-CM | POA: Insufficient documentation

## 2013-01-24 DIAGNOSIS — F17200 Nicotine dependence, unspecified, uncomplicated: Secondary | ICD-10-CM | POA: Insufficient documentation

## 2013-01-24 DIAGNOSIS — M6281 Muscle weakness (generalized): Secondary | ICD-10-CM | POA: Insufficient documentation

## 2013-01-24 HISTORY — DX: Depression, unspecified: F32.A

## 2013-01-24 HISTORY — DX: Major depressive disorder, single episode, unspecified: F32.9

## 2013-01-24 LAB — CBC WITH DIFFERENTIAL/PLATELET
Basophils Absolute: 0 10*3/uL (ref 0.0–0.1)
Basophils Relative: 0 % (ref 0–1)
Eosinophils Absolute: 0.2 10*3/uL (ref 0.0–0.7)
Eosinophils Relative: 2 % (ref 0–5)
HCT: 41.1 % (ref 36.0–46.0)
Hemoglobin: 13.7 g/dL (ref 12.0–15.0)
Lymphocytes Relative: 30 % (ref 12–46)
Lymphs Abs: 3.7 10*3/uL (ref 0.7–4.0)
MCH: 30.7 pg (ref 26.0–34.0)
MCHC: 33.3 g/dL (ref 30.0–36.0)
MCV: 92.2 fL (ref 78.0–100.0)
Monocytes Absolute: 0.9 10*3/uL (ref 0.1–1.0)
Monocytes Relative: 7 % (ref 3–12)
Neutro Abs: 7.5 10*3/uL (ref 1.7–7.7)
Neutrophils Relative %: 61 % (ref 43–77)
Platelets: 257 10*3/uL (ref 150–400)
RBC: 4.46 MIL/uL (ref 3.87–5.11)
RDW: 12.4 % (ref 11.5–15.5)
WBC: 12.3 10*3/uL — ABNORMAL HIGH (ref 4.0–10.5)

## 2013-01-24 LAB — BASIC METABOLIC PANEL
BUN: 16 mg/dL (ref 6–23)
CO2: 28 mEq/L (ref 19–32)
Calcium: 8.8 mg/dL (ref 8.4–10.5)
Chloride: 101 mEq/L (ref 96–112)
Creatinine, Ser: 0.73 mg/dL (ref 0.50–1.10)
GFR calc Af Amer: >90 mL/min (ref >90)
GFR calc non Af Amer: 88 mL/min — ABNORMAL LOW (ref >90)
Glucose, Bld: 134 mg/dL — ABNORMAL HIGH (ref 70–99)
Potassium: 3.3 mEq/L — ABNORMAL LOW (ref 3.5–5.1)
Sodium: 137 mEq/L (ref 135–145)

## 2013-01-24 MED ORDER — ACETAMINOPHEN 500 MG PO TABS
1000.0000 mg | ORAL_TABLET | Freq: Once | ORAL | Status: AC
  Administered 2013-01-24: 1000 mg via ORAL
  Filled 2013-01-24: qty 2

## 2013-01-24 MED ORDER — IOHEXOL 300 MG/ML  SOLN
80.0000 mL | Freq: Once | INTRAMUSCULAR | Status: AC | PRN
  Administered 2013-01-24: 80 mL via INTRAVENOUS

## 2013-01-24 MED ORDER — CLINDAMYCIN HCL 150 MG PO CAPS: 150.0000 mg | ORAL_CAPSULE | Freq: Four times a day (QID) | ORAL | Status: DC

## 2013-01-24 NOTE — ED Provider Notes (Signed)
History  This chart was scribed for Flint Melter, MD by Bennett Scrape, ED Scribe. This patient was seen in room APA04/APA04 and the patient's care was started at 12:55 PM.  CSN: [id number]  Arrival date & time 01/24/13  1123   First MD Initiated Contact with Patient 01/24/13 1255      Chief Complaint  Patient presents with  . Headache  . Abscess     Patient is a 67 y.o. female presenting with abscess. The history is provided by the patient. No language interpreter was used.  Abscess Location:  Shoulder/arm Shoulder/arm abscess location:  R axilla Abscess quality: painful   Abscess quality: not draining   Red streaking: no   Progression:  Unchanged Pain details:    Severity:  Mild Context: diabetes   Ineffective treatments:  None tried Associated symptoms: headaches and nausea   Associated symptoms: no fever and no vomiting   Risk factors: prior abscess     HPI Comments: ZAVANNAH DEBLOIS is a 67 y.o. female who presents to the Emergency Department complaining of one abscess in the right axilla with associated HA and nausea that started while she was at therapy for her legs. She is vague but states that neurology sent her for numbness. She reports one prior episode that got " a quart of infection out" in the right axilla. Pt states that she smelt "an infection odor" today. Pt states that she rubbed the area with alcohol pads and came back with "dried yellow infection". She also reports that she has a h/o frequent HAs that she has daily. She also reports that she was recently diagnosed with an UTI and states that the symptoms have since cleared with antibiotic treatment. She denies fevers, chills and emesis as associated symptoms. She has a h/o Hep C, HTN, HLD and DM. Pt is a current everyday smoker but denies alcohol use.  Past Medical History  Diagnosis Date  . Hypertension   . Asthma   . Arthritis   . Hep C w/o coma, chronic   . Type 2 diabetes mellitus   .  Hyperlipemia   . Depression     Past Surgical History  Procedure Laterality Date  . Cholecystectomy    . Appendectomy    . Abdominal hysterectomy    . Breast surgery      Family History  Problem Relation Age of Onset  . Arthritis    . Cancer    . Asthma    . Diabetes      History  Substance Use Topics  . Smoking status: Current Every Day Smoker -- 0.50 packs/day for 50 years  . Smokeless tobacco: Not on file     Comment: smoking for the past 50 years  . Alcohol Use: No    OB History   Grav Para Term Preterm Abortions TAB SAB Ect Mult Living   4 2 2  2  2   2       Review of Systems  Constitutional: Negative for fever and chills.  Gastrointestinal: Positive for nausea. Negative for vomiting.  Skin: Positive for wound.  Neurological: Positive for numbness (chronic) and headaches. Negative for weakness.  All other systems reviewed and are negative.    Allergies  Darvocet; Latex; Penicillins; Tetracyclines & related; and Adhesive  Home Medications   Current Outpatient Rx  Name  Route  Sig  Dispense  Refill  . albuterol (PROVENTIL) (2.5 MG/3ML) 0.083% nebulizer solution   Nebulization   Take 2.5 mg by  nebulization every 6 (six) hours as needed. For shortness of breath         . aspirin EC 81 MG tablet   Oral   Take 81 mg by mouth daily.           . fish oil-omega-3 fatty acids 1000 MG capsule   Oral   Take 1 g by mouth daily.         . fluticasone (FLONASE) 50 MCG/ACT nasal spray   Nasal   Place 2 sprays into the nose daily.           . furosemide (LASIX) 40 MG tablet   Oral   Take 40 mg by mouth daily.           Marland Kitchen ibuprofen (ADVIL,MOTRIN) 800 MG tablet   Oral   Take 800 mg by mouth every 8 (eight) hours as needed. Pain           . loratadine (CLARITIN) 10 MG tablet   Oral   Take 10 mg by mouth daily.          Marland Kitchen losartan-hydrochlorothiazide (HYZAAR) 100-25 MG per tablet   Oral   Take 1 tablet by mouth daily.         Marland Kitchen  omeprazole (PRILOSEC) 40 MG capsule   Oral   Take 40 mg by mouth 2 (two) times daily.           Marland Kitchen PARoxetine (PAXIL) 20 MG tablet   Oral   Take 20 mg by mouth 2 (two) times daily.          . potassium chloride SA (K-DUR,KLOR-CON) 20 MEQ tablet   Oral   Take 10 mEq by mouth daily.          . valsartan-hydrochlorothiazide (DIOVAN-HCT) 160-12.5 MG per tablet   Oral   Take 1 tablet by mouth daily.           . verapamil (CALAN-SR) 120 MG CR tablet   Oral   Take 1 tablet (120 mg total) by mouth at bedtime.   90 tablet   3   . albuterol (PROVENTIL HFA;VENTOLIN HFA) 108 (90 BASE) MCG/ACT inhaler   Inhalation   Inhale 2 puffs into the lungs every 6 (six) hours as needed. Shortness of breath          . beclomethasone (QVAR) 40 MCG/ACT inhaler   Inhalation   Inhale 2 puffs into the lungs 2 (two) times daily.           . clindamycin (CLEOCIN) 150 MG capsule   Oral   Take 1 capsule (150 mg total) by mouth every 6 (six) hours.   28 capsule   0     Triage Vitals: BP 129/95  Pulse 68  Temp(Src) 97.6 F (36.4 C) (Oral)  Resp 18  Ht 5' 5.5" (1.664 m)  Wt 278 lb (126.1 kg)  BMI 45.54 kg/m2  SpO2 99%  Physical Exam  Nursing note and vitals reviewed. Constitutional: She is oriented to person, place, and time. She appears well-developed and well-nourished.  HENT:  Head: Normocephalic and atraumatic.  Eyes: Conjunctivae and EOM are normal. Pupils are equal, round, and reactive to light.  Neck: Normal range of motion and phonation normal. Neck supple.  Cardiovascular: Normal rate, regular rhythm and intact distal pulses.   Pulmonary/Chest: Effort normal and breath sounds normal. She exhibits no tenderness.  Abdominal: Soft. She exhibits no distension. There is no tenderness. There is no guarding.  Musculoskeletal: Normal range of  motion.  mild fullness in the right axilla with tenderness, no discrete mass, well-healed surgical scar, no drainage and no redness, normal  ROM of the right shoulder  Neurological: She is alert and oriented to person, place, and time. She has normal strength. She exhibits normal muscle tone.  Skin: Skin is warm and dry.  Psychiatric: She has a normal mood and affect. Her behavior is normal. Judgment and thought content normal.    ED Course  Procedures (including critical care time)  DIAGNOSTIC STUDIES: Oxygen Saturation is 99% on room air, normal by my interpretation.    COORDINATION OF CARE: 1:04 PM-Discussed treatment plan which includes CT of the right shoulder, CBC panel, and BMP with pt at bedside and pt agreed to plan.   3:54 PM-Pt rechecked and informed of negative CT result. Discussed antibiotic and f/u with PCP in one week.  Labs Reviewed  CBC WITH DIFFERENTIAL - Abnormal; Notable for the following:    WBC 12.3 (*)    All other components within normal limits  BASIC METABOLIC PANEL - Abnormal; Notable for the following:    Potassium 3.3 (*)    Glucose, Bld 134 (*)    GFR calc non Af Amer 88 (*)    All other components within normal limits   Ct Chest W Contrast  01/24/2013   *RADIOLOGY REPORT*  Clinical Data: 67 year old female with right axillary pain.  Query soft tissue abscess.  Nausea.  Recent UTI.  Hepatitis C and hypertension.  CT CHEST WITH CONTRAST  Technique:  Multidetector CT imaging of the chest was performed following the standard protocol during bolus administration of intravenous contrast.  Contrast: 80mL OMNIPAQUE IOHEXOL 300 MG/ML  SOLN  Comparison: Chest radiographs 11/12/2012 and earlier. Chest CT 05/15/2006.  Findings: Negative thoracic inlet.  No right axillary lymphadenopathy, inflammatory stranding, or fluid collection.  Right breast tissue and chest wall appear within normal limits. Right upper extremity IV contrast injection was performed, visualized right upper extremity, axillary, and subclavian veins appear within normal limits.  No left axillary lymphadenopathy.  Negative visualized upper  abdominal viscera except for evidence of cholecystectomy.  Major airways are patent.  Paraseptal emphysema in the anterior right upper lobe.  Chronic pleural plaque or calcification in the right lung laterally on series 3 image 29 is only mildly increased since 2007 and therefore felt benign.  Negative right lung elsewhere.  Negative left lung.  No pleural effusion.  No pericardial effusion.  No mediastinal lymphadenopathy.  Occasional calcified atherosclerosis of the thoracic aorta.  Evidence of coronary calcified atherosclerosis.  No acute osseous abnormality identified.  IMPRESSION: 1.  No abscess or inflammatory changes in the right axilla, chest wall, or elsewhere in the chest. 2.  9 mm right lateral lung pleural plaque present in 2007 and only mildly increased since that time compatible with benign etiology.   Original Report Authenticated By: Erskine Speed, M.D.     1. Pain in axilla, right       MDM  Nonspecific right axillary pain, recurrent, and suspected as infection, by the patient. No evidence for abscess formation on CT. Mild elevation of white blood cell count, but stable metabolic status. No overt sign of infection. Doubt metabolic instability, serious bacterial infection or impending vascular collapse; the patient is stable for discharge. Will cover for early, unrecognized, right axillary infection.  Nursing Notes Reviewed/ Care Coordinated, and agree without changes. Applicable Imaging Reviewed.  Interpretation of Laboratory Data incorporated into ED treatment   Plan: Home Medications-  clindamycin; Home Treatments- heat; Recommended follow up- PCP, when necessary     I personally performed the services described in this documentation, which was scribed in my presence. The recorded information has been reviewed and is accurate.         Flint Melter, MD 01/24/13 [phone]

## 2013-01-24 NOTE — ED Notes (Signed)
Patient c/o headache with nausea. Per patient during therapy today she "smelt a smell" under her arm in which she has had before. Per patient has had MRSA in the past.

## 2013-01-24 NOTE — Progress Notes (Signed)
Physical Therapy Treatment Patient Details  Name: Megan Odom MRN: [id number] Date of Birth: February 28, 1946  Today's Date: 01/24/2013 Time: 4098-1191 PT Time Calculation (min): 51 min  Visit#: 9 of 16  Re-eval: 02/05/13 Charges: Therex x 28' Mechanical traction x 1  Authorization: [employer]   Subjective: Symptoms/Limitations Symptoms: Pt states that she is having pain in her hip today. Pain Assessment Currently in Pain?: Yes Pain Score:   6 Pain Location: Hip Pain Orientation: Right;Posterior  Exercise/Treatments Standing Wall Slides: 10 reps;5 seconds Other Standing Lumbar Exercises: Wall plank 5x10" Other Standing Lumbar Exercises: wall push-ups 10x Supine Large Ball Abdominal Isometric: 10 reps;5 seconds Large Ball Oblique Isometric: 10 reps;5 seconds Prone  Straight Leg Raise: 10 reps Other Prone Lumbar Exercises: heel squeeze 15x 10" Other Prone Lumbar Exercises: Glute sets 15x10"  Modalities Modalities: Traction Traction Type of Traction: Lumbar Min (lbs): n/a Max (lbs): 125 (pt. weighs 289#) Hold Time: static Time: 15  Physical Therapy Assessment and Plan PT Assessment and Plan Clinical Impression Statement: Pt requires multimodal cueing to facilitate core contraction with standing and supine exercises. Pt also requires vc's to isolate gluteal musculature with prone SLR. Mechanical traction completed as pt reports decrease radicular sx with previous treatments. Pt reports pain decrease to 4/10 at end of session. PT Plan: Continue to progress core/LE strength and decrease radicular sx per PT POC.     Problem List Patient Active Problem List   Diagnosis Date Noted  . Sleep apnea, obstructive 01/17/2013  . Paroxysmal atrial tachycardia 01/17/2013  . HTN (hypertension) 01/17/2013  . Morbid obesity 01/17/2013  . Chest pain at rest 01/17/2013  . Difficulty in walking 02/17/2012  . Bilateral leg weakness 02/17/2012  . Back pain 02/16/2012  . Bilateral  anterior knee pain 02/16/2012  . Knee pain, left 02/16/2012    PT - End of Session Activity Tolerance: Patient tolerated treatment well General Behavior During Therapy: [facility] for tasks assessed/performed Cognition: [facility] for tasks performed  Seth Bake, PTA  01/24/2013, 10:16 AM

## 2013-01-24 NOTE — ED Notes (Signed)
Patient with no complaints at this time. Respirations even and unlabored. Skin warm/dry. Discharge instructions reviewed with patient at this time. Patient given opportunity to voice concerns/ask questions. IV removed per policy and band-aid applied to site. Patient discharged at this time and left Emergency Department with steady gait.  

## 2013-01-24 NOTE — ED Notes (Signed)
Patient given ice chip and ginger ale per RN approval. Patient asked for something for her headache. RN made aware. Patient also states she would like some ice cream it will help with her headache. RN aware.

## 2013-01-29 ENCOUNTER — Ambulatory Visit (HOSPITAL_COMMUNITY): Payer: Medicare Other | Admitting: Physical Therapy

## 2013-02-05 ENCOUNTER — Ambulatory Visit (HOSPITAL_COMMUNITY)
Admission: RE | Admit: 2013-02-05 | Discharge: 2013-02-05 | Disposition: A | Discharge status: Home / Self Care | Payer: Medicare Other | Source: Ambulatory Visit | Attending: Internal Medicine | Admitting: Internal Medicine

## 2013-02-05 NOTE — Evaluation (Signed)
Physical Therapy Discharge Summary  Patient Details  Name: Megan Odom MRN: [id number] Date of Birth: 1946-07-21  Today's Date: 02/05/2013 Time: 1302-1340 PT Time Calculation (min): 38 min              Visit#: 10 of 16  10 visits in 7 week period of time. Re-eval: 02/05/13 Assessment Diagnosis: lumbar pain, B knee pain Charges: MMT x 1 Self care x 23'  Authorization: [employer]      Past Medical History:  Past Medical History  Diagnosis Date  . Hypertension   . Asthma   . Arthritis   . Hep C w/o coma, chronic   . Type 2 diabetes mellitus   . Hyperlipemia   . Depression    Past Surgical History:  Past Surgical History  Procedure Laterality Date  . Cholecystectomy    . Appendectomy    . Abdominal hysterectomy    . Breast surgery      Subjective Symptoms/Limitations Symptoms: Pt states that she was having some leg pain this morning but she is currently pain free. Pain Assessment Currently in Pain?: No/denies   Sensation/Coordination/Flexibility/Functional Tests Functional Tests Functional Tests: LEFS 30/80  Assessment RLE Strength Right Hip Flexion:  (4+/5) last reevaluation was a 4/5 Right Hip Extension:  (4+/5) was 3+/5 Right Hip ABduction:  (4+/5) was 3+/5 Right Hip ADduction:  (4+/5) was 3/5 Right Knee Flexion: 5/5 was 5/5 Right Knee Extension: 5/5 was 4/5 Right Ankle Dorsiflexion: 5/5 LLE Strength Left Hip Flexion:  (4+/5) was 4/5 Left Hip Extension:  (4+/5) was 3+/5 Left Hip ABduction:  (4+/5) was 3+/5 Left Hip ADduction:  (4+/5) was 4/5 Left Knee Flexion: 5/5 was 5/5 Left Knee Extension: 5/5 was 4/5 Lumbar AROM Lumbar Flexion: wnl Lumbar Extension: wnl Lumbar - Right Side Bend: wnl Lumbar - Left Side Bend: wnl Lumbar - Right Rotation: wnl Lumbar - Left Rotation: wnl   Physical Therapy Assessment and Plan PT Assessment and Plan Clinical Impression Statement: Pt has progressed well with therapy. Pt is currently pain free. 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 Pt has met or is progressing toward all goals. Pt is independent with HEP.  PT Plan: Recommend D/C to HEP.    Goals Home Exercise Program Pt will Perform Home Exercise Program: Independently PT Short Term Goals Time to Complete Short Term Goals: 2 weeks PT Short Term Goal 1: I in HEP PT Short Term Goal 1 - Progress: Met PT Short Term Goal 2: Pain has gone down 3 levels. 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  (Pt can walk 10  minutes) Long Term Goal 4 Progress: Progressing toward goal PT Long Term Goal 5: Pt to be able to stand for 20 minutes painfree (Pt can stand 15 minutes) Long Term Goal 5 Progress: Progressing toward goal PT Long Term Goal 6: LEFS to be 30 currently at 21 (Met)  Problem List Patient Active Problem List   Diagnosis Date Noted  . Sleep apnea, obstructive 01/17/2013  . Paroxysmal atrial tachycardia 01/17/2013  . HTN (hypertension) 01/17/2013  . Morbid obesity 01/17/2013  . Chest pain at rest 01/17/2013  . Difficulty in walking 02/17/2012  . Bilateral leg weakness 02/17/2012  . Back pain 02/16/2012  . Bilateral anterior knee pain 02/16/2012  . Knee pain, left 02/16/2012    PT  - End of Session Activity Tolerance: Patient tolerated treatment well General Behavior During Therapy: WFL for tasks assessed/performed Cognition: WFL for tasks performed PT Plan of Care PT Patient Instructions: Body mechanics with vacuuming and mopping.   Seth Bake, PTA  02/05/2013, 1:52 PM  Physician Documentation Your signature is required to indicate approval of the treatment plan as stated above.  Please sign and either send electronically or make a copy of this report for your files and return this physician signed original.   Please mark one 1.__approve of plan  2. ___approve of plan with the following conditions.   ______________________________                                                          _____________________ Physician Signature                                                                                                             Date

## 2013-02-07 ENCOUNTER — Ambulatory Visit (HOSPITAL_COMMUNITY): Payer: Medicare Other | Admitting: *Deleted

## 2013-02-12 ENCOUNTER — Ambulatory Visit (HOSPITAL_COMMUNITY): Payer: Medicare Other | Admitting: *Deleted

## 2013-02-13 ENCOUNTER — Emergency Department (HOSPITAL_COMMUNITY)
Admission: EM | Admit: 2013-02-13 | Discharge: 2013-02-13 | Disposition: A | Discharge status: Home / Self Care | Payer: Medicare Other | Attending: Emergency Medicine | Admitting: Emergency Medicine

## 2013-02-13 ENCOUNTER — Emergency Department (HOSPITAL_COMMUNITY): Payer: Medicare Other

## 2013-02-13 ENCOUNTER — Telehealth: Payer: Self-pay | Admitting: Cardiovascular Disease

## 2013-02-13 DIAGNOSIS — Z8619 Personal history of other infectious and parasitic diseases: Secondary | ICD-10-CM | POA: Insufficient documentation

## 2013-02-13 DIAGNOSIS — Z880 Allergy status to penicillin: Secondary | ICD-10-CM | POA: Insufficient documentation

## 2013-02-13 DIAGNOSIS — Z8639 Personal history of other endocrine, nutritional and metabolic disease: Secondary | ICD-10-CM | POA: Insufficient documentation

## 2013-02-13 DIAGNOSIS — M129 Arthropathy, unspecified: Secondary | ICD-10-CM | POA: Insufficient documentation

## 2013-02-13 DIAGNOSIS — R51 Headache: Secondary | ICD-10-CM | POA: Insufficient documentation

## 2013-02-13 DIAGNOSIS — F3289 Other specified depressive episodes: Secondary | ICD-10-CM | POA: Insufficient documentation

## 2013-02-13 DIAGNOSIS — J45909 Unspecified asthma, uncomplicated: Secondary | ICD-10-CM | POA: Insufficient documentation

## 2013-02-13 DIAGNOSIS — R519 Headache, unspecified: Secondary | ICD-10-CM

## 2013-02-13 DIAGNOSIS — I10 Essential (primary) hypertension: Secondary | ICD-10-CM | POA: Insufficient documentation

## 2013-02-13 DIAGNOSIS — Z862 Personal history of diseases of the blood and blood-forming organs and certain disorders involving the immune mechanism: Secondary | ICD-10-CM | POA: Insufficient documentation

## 2013-02-13 DIAGNOSIS — IMO0002 Reserved for concepts with insufficient information to code with codable children: Secondary | ICD-10-CM | POA: Insufficient documentation

## 2013-02-13 DIAGNOSIS — Z91040 Latex allergy status: Secondary | ICD-10-CM | POA: Insufficient documentation

## 2013-02-13 DIAGNOSIS — K111 Hypertrophy of salivary gland: Secondary | ICD-10-CM | POA: Insufficient documentation

## 2013-02-13 DIAGNOSIS — E119 Type 2 diabetes mellitus without complications: Secondary | ICD-10-CM | POA: Insufficient documentation

## 2013-02-13 DIAGNOSIS — Z79899 Other long term (current) drug therapy: Secondary | ICD-10-CM | POA: Insufficient documentation

## 2013-02-13 DIAGNOSIS — Z7982 Long term (current) use of aspirin: Secondary | ICD-10-CM | POA: Insufficient documentation

## 2013-02-13 DIAGNOSIS — R209 Unspecified disturbances of skin sensation: Secondary | ICD-10-CM | POA: Insufficient documentation

## 2013-02-13 DIAGNOSIS — F17200 Nicotine dependence, unspecified, uncomplicated: Secondary | ICD-10-CM | POA: Insufficient documentation

## 2013-02-13 DIAGNOSIS — F329 Major depressive disorder, single episode, unspecified: Secondary | ICD-10-CM | POA: Insufficient documentation

## 2013-02-13 DIAGNOSIS — R200 Anesthesia of skin: Secondary | ICD-10-CM

## 2013-02-13 LAB — COMPREHENSIVE METABOLIC PANEL
ALT: 80 U/L — ABNORMAL HIGH (ref 0–35)
AST: 105 U/L — ABNORMAL HIGH (ref 0–37)
Albumin: 3 g/dL — ABNORMAL LOW (ref 3.5–5.2)
Alkaline Phosphatase: 142 U/L — ABNORMAL HIGH (ref 39–117)
BUN: 15 mg/dL (ref 6–23)
CO2: 27 mEq/L (ref 19–32)
Calcium: 9.2 mg/dL (ref 8.4–10.5)
Chloride: 99 mEq/L (ref 96–112)
Creatinine, Ser: 0.81 mg/dL (ref 0.50–1.10)
GFR calc Af Amer: 86 mL/min — ABNORMAL LOW (ref >90)
GFR calc non Af Amer: 75 mL/min — ABNORMAL LOW (ref >90)
Glucose, Bld: 99 mg/dL (ref 70–99)
Potassium: 3.8 mEq/L (ref 3.5–5.1)
Sodium: 135 mEq/L (ref 135–145)
Total Bilirubin: 0.7 mg/dL (ref 0.3–1.2)
Total Protein: 9.7 g/dL — ABNORMAL HIGH (ref 6.0–8.3)

## 2013-02-13 LAB — CBC WITH DIFFERENTIAL/PLATELET
Basophils Absolute: 0 10*3/uL (ref 0.0–0.1)
Basophils Relative: 0 % (ref 0–1)
Eosinophils Absolute: 0.2 10*3/uL (ref 0.0–0.7)
Eosinophils Relative: 2 % (ref 0–5)
HCT: 42.1 % (ref 36.0–46.0)
Hemoglobin: 14 g/dL (ref 12.0–15.0)
Lymphocytes Relative: 32 % (ref 12–46)
Lymphs Abs: 3.9 10*3/uL (ref 0.7–4.0)
MCH: 30.7 pg (ref 26.0–34.0)
MCHC: 33.3 g/dL (ref 30.0–36.0)
MCV: 92.3 fL (ref 78.0–100.0)
Monocytes Absolute: 0.9 10*3/uL (ref 0.1–1.0)
Monocytes Relative: 7 % (ref 3–12)
Neutro Abs: 7.3 10*3/uL (ref 1.7–7.7)
Neutrophils Relative %: 59 % (ref 43–77)
Platelets: 263 10*3/uL (ref 150–400)
RBC: 4.56 MIL/uL (ref 3.87–5.11)
RDW: 12.7 % (ref 11.5–15.5)
WBC: 12.3 10*3/uL — ABNORMAL HIGH (ref 4.0–10.5)

## 2013-02-13 MED ORDER — DIPHENHYDRAMINE HCL 50 MG/ML IJ SOLN
25.0000 mg | Freq: Once | INTRAMUSCULAR | Status: AC
  Administered 2013-02-13: 25 mg via INTRAVENOUS
  Filled 2013-02-13: qty 1

## 2013-02-13 MED ORDER — PROMETHAZINE HCL 25 MG PO TABS: 25.0000 mg | ORAL_TABLET | Freq: Four times a day (QID) | ORAL | Status: DC | PRN

## 2013-02-13 MED ORDER — OXYCODONE-ACETAMINOPHEN 5-325 MG PO TABS: 2.0000 | ORAL_TABLET | ORAL | Status: DC | PRN

## 2013-02-13 MED ORDER — METOCLOPRAMIDE HCL 5 MG/ML IJ SOLN
10.0000 mg | Freq: Once | INTRAMUSCULAR | Status: AC
  Administered 2013-02-13: 10 mg via INTRAVENOUS
  Filled 2013-02-13: qty 2

## 2013-02-13 MED ORDER — SODIUM CHLORIDE 0.9 % IV SOLN
Freq: Once | INTRAVENOUS | Status: AC
  Administered 2013-02-13: 14:00:00 via INTRAVENOUS

## 2013-02-13 MED ORDER — KETOROLAC TROMETHAMINE 30 MG/ML IJ SOLN
30.0000 mg | Freq: Once | INTRAMUSCULAR | Status: AC
  Administered 2013-02-13: 30 mg via INTRAVENOUS
  Filled 2013-02-13: qty 1

## 2013-02-13 NOTE — Telephone Encounter (Signed)
Stop verapamil, bring in to see PA/NP please. Consider Bystolic instead.

## 2013-02-13 NOTE — ED Notes (Signed)
States that she started having headaches and left leg numbness since last Friday, states that she called her cardiologist over a sleep apnea study and advised them of her symptoms and they referred her to the ER.  States that she has had nausea, no vomiting.  States that she has been feeling dizziness and has fell a few times recently.

## 2013-02-13 NOTE — ED Provider Notes (Signed)
History  This chart was scribed for Donnetta Hutching, MD by Bennett Scrape, ED Scribe. This patient was seen in room [facility] and the patient's care was started at 1:02 PM.  CSN: [id number] Arrival date & time 02/13/13  1247   First MD Initiated Contact with Patient 02/13/13 1302     Chief Complaint  Patient presents with  . Headache  . Numbness    The history is provided by the patient. No language interpreter was used.    HPI Comments: Megan Odom is a 67 y.o. female who presents to the Emergency Department complaining of 5 days of non-changing, constant left leg numbness with an associated occipitally located HA and nausea. She reports that the nausea started first with the HA and leg numbness starting shortly after.  She states that she is still able to ambulate but the entire leg feels numb and she feels unsteady on her feet. Pt reports that she is currently being followed by her Cardiologist for a sleep apnea study and was advised to be evaluated after telling them about the symptoms. She is also being followed by a neurologist for chronic sharp lower back pains that radiate into bilateral legs and was receiving an unknown "shot into the nerve" with mild improvement. She denies similarities with that pain. She denies emesis as an associated symptom. She has a h/o HTN, DM and Hep C. Pt is a current everyday smoker but denies alcohol use.  Neurologist is Dr. Gerilyn Pilgrim  Past Medical History  Diagnosis Date  . Hypertension   . Asthma   . Arthritis   . Hep C w/o coma, chronic   . Type 2 diabetes mellitus   . Hyperlipemia   . Depression    Past Surgical History  Procedure Laterality Date  . Cholecystectomy    . Appendectomy    . Abdominal hysterectomy    . Breast surgery     Family History  Problem Relation Age of Onset  . Arthritis    . Cancer    . Asthma    . Diabetes     History  Substance Use Topics  . Smoking status: Current Every Day Smoker -- 0.50 packs/day  for 50 years  . Smokeless tobacco: Not on file     Comment: smoking for the past 50 years  . Alcohol Use: No   OB History   Grav Para Term Preterm Abortions TAB SAB Ect Mult Living   4 2 2  2  2   2      Review of Systems  A complete 10 system review of systems was obtained and all systems are negative except as noted in the HPI and PMH.   Allergies  Darvocet; Latex; Penicillins; Tetracyclines & related; and Adhesive  Home Medications   Current Outpatient Rx  Name  Route  Sig  Dispense  Refill  . albuterol (PROVENTIL HFA;VENTOLIN HFA) 108 (90 BASE) MCG/ACT inhaler   Inhalation   Inhale 2 puffs into the lungs every 6 (six) hours as needed. Shortness of breath          . albuterol (PROVENTIL) (2.5 MG/3ML) 0.083% nebulizer solution   Nebulization   Take 2.5 mg by nebulization every 6 (six) hours as needed. For shortness of breath         . aspirin EC 81 MG tablet   Oral   Take 81 mg by mouth daily.           . beclomethasone (QVAR) 40 MCG/ACT inhaler  Inhalation   Inhale 2 puffs into the lungs 2 (two) times daily.           . clindamycin (CLEOCIN) 150 MG capsule   Oral   Take 1 capsule (150 mg total) by mouth every 6 (six) hours.   28 capsule   0   . fish oil-omega-3 fatty acids 1000 MG capsule   Oral   Take 1 g by mouth daily.         . fluticasone (FLONASE) 50 MCG/ACT nasal spray   Nasal   Place 2 sprays into the nose daily.           . furosemide (LASIX) 40 MG tablet   Oral   Take 40 mg by mouth daily.           Marland Kitchen ibuprofen (ADVIL,MOTRIN) 800 MG tablet   Oral   Take 800 mg by mouth every 8 (eight) hours as needed. Pain           . loratadine (CLARITIN) 10 MG tablet   Oral   Take 10 mg by mouth daily.          Marland Kitchen losartan-hydrochlorothiazide (HYZAAR) 100-25 MG per tablet   Oral   Take 1 tablet by mouth daily.         Marland Kitchen omeprazole (PRILOSEC) 40 MG capsule   Oral   Take 40 mg by mouth 2 (two) times daily.           Marland Kitchen PARoxetine  (PAXIL) 20 MG tablet   Oral   Take 20 mg by mouth 2 (two) times daily.          . potassium chloride SA (K-DUR,KLOR-CON) 20 MEQ tablet   Oral   Take 10 mEq by mouth daily.          . valsartan-hydrochlorothiazide (DIOVAN-HCT) 160-12.5 MG per tablet   Oral   Take 1 tablet by mouth daily.           . verapamil (CALAN-SR) 120 MG CR tablet   Oral   Take 1 tablet (120 mg total) by mouth at bedtime.   90 tablet   3    Triage Vitals: BP 135/79  Pulse 62  Temp(Src) 98.3 F (36.8 C) (Oral)  Resp 20  Ht 5' 5.5" (1.664 m)  Wt 278 lb (126.1 kg)  BMI 45.54 kg/m2  SpO2 94%  Physical Exam  Nursing note and vitals reviewed. Constitutional: She is oriented to person, place, and time. No distress.  obese  HENT:  Head: Normocephalic and atraumatic.  Eyes: Conjunctivae and EOM are normal. Pupils are equal, round, and reactive to light.  Neck: Normal range of motion. Neck supple.  Cardiovascular: Normal rate, regular rhythm and normal heart sounds.   Pulmonary/Chest: Effort normal and breath sounds normal.  Abdominal: Soft. Bowel sounds are normal.  Musculoskeletal: Normal range of motion.  Neurological: She is alert and oriented to person, place, and time. No cranial nerve deficit.  Grossly normal neurological exam  Skin: Skin is warm and dry.  Psychiatric: She has a normal mood and affect.    ED Course  Procedures (including critical care time)  Medications  0.9 %  sodium chloride infusion ( Intravenous New Bag/Given 02/13/13 1342)  metoCLOPramide (REGLAN) injection 10 mg (10 mg Intravenous Given 02/13/13 1342)  diphenhydrAMINE (BENADRYL) injection 25 mg (25 mg Intravenous Given 02/13/13 1342)  ketorolac (TORADOL) 30 MG/ML injection 30 mg (30 mg Intravenous Given 02/13/13 1342)    DIAGNOSTIC STUDIES: Oxygen Saturation is  94% on room air, adequate by my interpretation.    COORDINATION OF CARE: 1:32 PM-Discussed treatment plan which includes medications, CT of head, CBC  panel and CMP with pt at bedside and pt agreed to plan.   Labs Reviewed  CBC WITH DIFFERENTIAL - Abnormal; Notable for the following:    WBC 12.3 (*)    All other components within normal limits  COMPREHENSIVE METABOLIC PANEL - Abnormal; Notable for the following:    Total Protein 9.7 (*)    Albumin 3.0 (*)    AST 105 (*)    ALT 80 (*)    Alkaline Phosphatase 142 (*)    GFR calc non Af Amer 75 (*)    GFR calc Af Amer 86 (*)    All other components within normal limits     Ct Head Wo Contrast  02/13/2013   *RADIOLOGY REPORT*  Clinical Data: Headaches and numbness.  Diabetic hypertensive patient.  CT HEAD WITHOUT CONTRAST  Technique:  Contiguous axial images were obtained from the base of the skull through the vertex without contrast.  Comparison: 04/24/2012, 02/26/2011 and 11/05/2009.  Findings: No intracranial hemorrhage.  No CT evidence of large acute infarct.  No hydrocephalus.  No intracranial mass lesion detected on this unenhanced exam.  Vascular calcifications.  Bilateral parotid lesions measuring up to 2.5 cm incompletely assessed on the present examination.  These appear different than on the 11/05/2009 neck CT.  Clinical correlation recommended.  Stable appearance of subtle calvarial lucencies most notable left frontal calvarium of questionable etiology.  Mastoid air cells, middle ear cavities and paranasal sinuses are clear.  Orbital structures unremarkable with exception of mild exophthalmos.  IMPRESSION: No intracranial hemorrhage.  No CT evidence of large acute infarct.  No hydrocephalus.  No intracranial mass lesion detected on this unenhanced exam.  Vascular calcifications.  Bilateral parotid lesions measuring up to 2.5 cm incompletely assessed on the present examination.  These appear different than on the 11/05/2009 neck CT.  Clinical correlation recommended.  Stable appearance of subtle calvarial lucencies most notable left frontal calvarium of questionable etiology.   Original  Report Authenticated By: Lacy Duverney, M.D.    Date: 02/13/2013  Rate: 59  Rhythm: sinus brady  QRS Axis: normal  Intervals: normal  ST/T Wave abnormalities: normal  Conduction Disutrbances: none  Narrative Interpretation: unremarkable     No results found. No diagnosis found.  MDM  No clinical evidence of stroke. Neuro exam normal. Patient is ambulatory. Symptoms of left leg numbness are not new.  Discussed parotid gland enlargement with patient and her daughter. Also discussed elevated liver functions. Patient will follow up with otolaryngology  I personally performed the services described in this documentation, which was scribed in my presence. The recorded information has been reviewed and is accurate.    Donnetta Hutching, MD 02/13/13 [phone]

## 2013-02-13 NOTE — Telephone Encounter (Signed)
Returned call.  No answer.  Chart reviewed and pt at [facility].  Will follow-up tomorrow.

## 2013-02-13 NOTE — Telephone Encounter (Signed)
Head hurting so bad and left side of body hurting!Not sure it might be the new BP medicine she is on-This goes on for long period of times during the day!

## 2013-02-13 NOTE — Telephone Encounter (Signed)
Returned call.  Pt stated she was supposed to be getting a sleep study.  Stated since she started the pills she has been having a HA.  Pt c/o HA around the whole head.  Stated sometimes she has to go in the room in the dark to get some relief.  Stated she had a HA before the pills, but not like this.  Stated 3 nights ago BP was 180/111.  Asked pt to check BP now: 187/98 HR 59.  HA 8/10 on pain scale.  Stated it's usually 10 all of the time.  Stated the pills are the new BP pills and name of med is verapamil 120mg .  Pt stated she has never had a HA like this before.  Denied facial drooping.  Admitted she has somewhat of an unsteady gait and a little slurred speech.  Asked pt if she had someone to take her to ER and pt stated her daughter is w/ her and can take her.  Pt advised to have daughter take her to ER now for evaluation.  Pt agreed w/ plan.  Message forwarded to Dr. Royann Shivers [facility]).

## 2013-02-14 ENCOUNTER — Ambulatory Visit (HOSPITAL_COMMUNITY): Payer: Medicare Other | Admitting: *Deleted

## 2013-02-27 ENCOUNTER — Ambulatory Visit: Payer: Medicare Other | Attending: Cardiovascular Disease | Admitting: Sleep Medicine

## 2013-02-27 DIAGNOSIS — R0989 Other specified symptoms and signs involving the circulatory and respiratory systems: Secondary | ICD-10-CM | POA: Insufficient documentation

## 2013-02-27 DIAGNOSIS — G4733 Obstructive sleep apnea (adult) (pediatric): Secondary | ICD-10-CM

## 2013-02-27 DIAGNOSIS — G4710 Hypersomnia, unspecified: Secondary | ICD-10-CM | POA: Insufficient documentation

## 2013-02-27 DIAGNOSIS — E669 Obesity, unspecified: Secondary | ICD-10-CM | POA: Insufficient documentation

## 2013-02-27 DIAGNOSIS — R5383 Other fatigue: Secondary | ICD-10-CM | POA: Insufficient documentation

## 2013-02-27 DIAGNOSIS — R5381 Other malaise: Secondary | ICD-10-CM | POA: Insufficient documentation

## 2013-02-27 DIAGNOSIS — R0609 Other forms of dyspnea: Secondary | ICD-10-CM | POA: Insufficient documentation

## 2013-03-04 ENCOUNTER — Telehealth: Payer: Self-pay | Admitting: *Deleted

## 2013-03-04 NOTE — Procedures (Signed)
[facility] Kimberely Mccannon A. Gerilyn Pilgrim, MD     [email or web address]        NAME:  Megan Odom, Megan Odom            ACCOUNT NO.:  [id number]  MEDICAL RECORD NO.:  [id number]          PATIENT TYPE:  OUT  LOCATION:  SLEEP LAB                     FACILITY:  [facility]  PHYSICIAN:  Eloyse Causey A. Gerilyn Pilgrim, M.D. DATE OF BIRTH:  [birth year]  DATE OF STUDY:  02/27/2013                           NOCTURNAL POLYSOMNOGRAM  REFERRING PHYSICIAN:  Thurmon Fair, MD  INDICATION:  A 67 year old woman, who presents with loud snoring, obesity, fatigue, and hypersomnia.   MEDICATIONS:  Albuterol, Flonase, aspirin, ibuprofen, loratadine, omeprazole, Lasix, potassium, verapamil, losartan, hydrochlorothiazide, vitamin D, and Paxil.  EPWORTH SLEEPINESS SCALE:  8.  BMI:  46.  ARCHITECTURAL SUMMARY:  The total recording time is 361 minutes.  Sleep efficiency 89%.  Sleep latency 12 minutes.  REM latency 184 minutes. Stage N1 10%, N2 75%, N3 7%, and REM sleep 9%.  RESPIRATORY SUMMARY:  Baseline oxygen saturation is 96, lowest saturation 85 during REM sleep.  Diagnostic AHI is 4 and RDI also 4. The REM AHI is 13.  LIMB MOVEMENT SUMMARY:  PLM index 0.  ELECTROCARDIOGRAM SUMMARY:  Average heart rate is 56, with no significant dysrhythmias observed.  IMPRESSION:  Unremarkable nocturnal polysomnography.  Thank you for this referral.     Tammi Boulier A. Gerilyn Pilgrim, M.D.    KAD/MEDQ  D:  03/04/2013 10:09:49  T:  03/04/2013 10:31:20  Job:  [id number]

## 2013-03-04 NOTE — Telephone Encounter (Signed)
Spoke to patient.Informed her the sleep study was negative per Dr C. Voiced understanding. Next appt 5 2015.

## 2013-03-27 ENCOUNTER — Other Ambulatory Visit: Payer: Self-pay

## 2013-05-23 ENCOUNTER — Encounter (HOSPITAL_COMMUNITY): Payer: Self-pay | Admitting: *Deleted

## 2013-05-23 ENCOUNTER — Emergency Department (HOSPITAL_COMMUNITY): Payer: Medicare Other

## 2013-05-23 ENCOUNTER — Emergency Department (HOSPITAL_COMMUNITY)
Admission: EM | Admit: 2013-05-23 | Discharge: 2013-05-23 | Disposition: A | Discharge status: Home / Self Care | Payer: Medicare Other | Attending: Emergency Medicine | Admitting: Emergency Medicine

## 2013-05-23 DIAGNOSIS — S92919A Unspecified fracture of unspecified toe(s), initial encounter for closed fracture: Secondary | ICD-10-CM | POA: Insufficient documentation

## 2013-05-23 DIAGNOSIS — F17200 Nicotine dependence, unspecified, uncomplicated: Secondary | ICD-10-CM | POA: Insufficient documentation

## 2013-05-23 DIAGNOSIS — Z862 Personal history of diseases of the blood and blood-forming organs and certain disorders involving the immune mechanism: Secondary | ICD-10-CM | POA: Insufficient documentation

## 2013-05-23 DIAGNOSIS — X58XXXA Exposure to other specified factors, initial encounter: Secondary | ICD-10-CM | POA: Insufficient documentation

## 2013-05-23 DIAGNOSIS — Y939 Activity, unspecified: Secondary | ICD-10-CM | POA: Insufficient documentation

## 2013-05-23 DIAGNOSIS — F329 Major depressive disorder, single episode, unspecified: Secondary | ICD-10-CM | POA: Insufficient documentation

## 2013-05-23 DIAGNOSIS — Z7982 Long term (current) use of aspirin: Secondary | ICD-10-CM | POA: Insufficient documentation

## 2013-05-23 DIAGNOSIS — IMO0002 Reserved for concepts with insufficient information to code with codable children: Secondary | ICD-10-CM | POA: Insufficient documentation

## 2013-05-23 DIAGNOSIS — M129 Arthropathy, unspecified: Secondary | ICD-10-CM | POA: Insufficient documentation

## 2013-05-23 DIAGNOSIS — Z8619 Personal history of other infectious and parasitic diseases: Secondary | ICD-10-CM | POA: Insufficient documentation

## 2013-05-23 DIAGNOSIS — Z79899 Other long term (current) drug therapy: Secondary | ICD-10-CM | POA: Insufficient documentation

## 2013-05-23 DIAGNOSIS — F3289 Other specified depressive episodes: Secondary | ICD-10-CM | POA: Insufficient documentation

## 2013-05-23 DIAGNOSIS — S92911A Unspecified fracture of right toe(s), initial encounter for closed fracture: Secondary | ICD-10-CM

## 2013-05-23 DIAGNOSIS — R109 Unspecified abdominal pain: Secondary | ICD-10-CM | POA: Insufficient documentation

## 2013-05-23 DIAGNOSIS — J40 Bronchitis, not specified as acute or chronic: Secondary | ICD-10-CM

## 2013-05-23 DIAGNOSIS — Z8639 Personal history of other endocrine, nutritional and metabolic disease: Secondary | ICD-10-CM | POA: Insufficient documentation

## 2013-05-23 DIAGNOSIS — Y929 Unspecified place or not applicable: Secondary | ICD-10-CM | POA: Insufficient documentation

## 2013-05-23 DIAGNOSIS — Z880 Allergy status to penicillin: Secondary | ICD-10-CM | POA: Insufficient documentation

## 2013-05-23 DIAGNOSIS — Z91040 Latex allergy status: Secondary | ICD-10-CM | POA: Insufficient documentation

## 2013-05-23 DIAGNOSIS — J45901 Unspecified asthma with (acute) exacerbation: Secondary | ICD-10-CM | POA: Insufficient documentation

## 2013-05-23 DIAGNOSIS — E119 Type 2 diabetes mellitus without complications: Secondary | ICD-10-CM | POA: Insufficient documentation

## 2013-05-23 DIAGNOSIS — I10 Essential (primary) hypertension: Secondary | ICD-10-CM | POA: Insufficient documentation

## 2013-05-23 MED ORDER — TRAMADOL HCL 50 MG PO TABS: 50.0000 mg | ORAL_TABLET | Freq: Four times a day (QID) | ORAL | Status: DC | PRN

## 2013-05-23 MED ORDER — PREDNISONE 10 MG PO TABS: 40.0000 mg | ORAL_TABLET | Freq: Every day | ORAL | Status: DC

## 2013-05-23 MED ORDER — ALBUTEROL SULFATE (5 MG/ML) 0.5% IN NEBU
2.5000 mg | INHALATION_SOLUTION | Freq: Once | RESPIRATORY_TRACT | Status: AC
  Administered 2013-05-23: 2.5 mg via RESPIRATORY_TRACT
  Filled 2013-05-23: qty 0.5

## 2013-05-23 MED ORDER — DM-GUAIFENESIN ER 30-600 MG PO TB12: 1.0000 | ORAL_TABLET | Freq: Two times a day (BID) | ORAL | Status: DC

## 2013-05-23 MED ORDER — PREDNISONE 50 MG PO TABS
60.0000 mg | ORAL_TABLET | Freq: Once | ORAL | Status: AC
  Administered 2013-05-23: 60 mg via ORAL
  Filled 2013-05-23 (×2): qty 1

## 2013-05-23 MED ORDER — IPRATROPIUM BROMIDE 0.02 % IN SOLN
0.5000 mg | Freq: Once | RESPIRATORY_TRACT | Status: AC
  Administered 2013-05-23: 0.5 mg via RESPIRATORY_TRACT
  Filled 2013-05-23: qty 2.5

## 2013-05-23 NOTE — ED Notes (Signed)
Patient given discharge instruction, verbalized understand. Patient ambulatory out of the department.  

## 2013-05-23 NOTE — ED Provider Notes (Signed)
CSN: [id number]     Arrival date & time 05/23/13  1650 History   First MD Initiated Contact with Patient 05/23/13 1708     Chief Complaint  Patient presents with  . Shortness of Breath   (Consider location/radiation/quality/duration/timing/severity/associated sxs/prior Treatment) Patient is a 67 y.o. female presenting with shortness of breath. The history is provided by the patient.  Shortness of Breath Severity:  Moderate Associated symptoms: abdominal pain, chest pain and cough   Associated symptoms: no fever, no headaches, no rash and no vomiting    patient with a known history of asthma. Has albuterol inhalers at home. There were upper respiratory infection for the past 3-4 days with a significant cough resulting in soreness in the chest and abdomen from coughing. Patient today was mixing some cleaning fluids and got a cute coughing exacerbation. Feeling or short of breath. Patient also injured her right foot fourth toe several days ago it is still hurting patient's concerned it may be fractured.  Past Medical History  Diagnosis Date  . Hypertension   . Asthma   . Arthritis   . Hep C w/o coma, chronic   . Type 2 diabetes mellitus   . Hyperlipemia   . Depression    Past Surgical History  Procedure Laterality Date  . Cholecystectomy    . Appendectomy    . Abdominal hysterectomy    . Breast surgery     Family History  Problem Relation Age of Onset  . Arthritis    . Cancer    . Asthma    . Diabetes     History  Substance Use Topics  . Smoking status: Current Every Day Smoker -- 0.50 packs/day for 50 years  . Smokeless tobacco: Not on file     Comment: smoking for the past 50 years  . Alcohol Use: No   OB History   Grav Para Term Preterm Abortions TAB SAB Ect Mult Living   4 2 2  2  2   2      Review of Systems  Constitutional: Negative for fever.  HENT: Positive for congestion.   Eyes: Negative for redness.  Respiratory: Positive for cough and shortness of  breath.   Cardiovascular: Positive for chest pain.  Gastrointestinal: Positive for abdominal pain. Negative for nausea and vomiting.  Genitourinary: Negative for dysuria.  Musculoskeletal: Negative for back pain.  Skin: Negative for rash.  Neurological: Negative for headaches.  Hematological: Does not bruise/bleed easily.  Psychiatric/Behavioral: Negative for confusion.    Allergies  Darvocet; Latex; Other; Penicillins; Tetracyclines & related; and Adhesive  Home Medications   Current Outpatient Rx  Name  Route  Sig  Dispense  Refill  . albuterol (PROVENTIL HFA;VENTOLIN HFA) 108 (90 BASE) MCG/ACT inhaler   Inhalation   Inhale 2 puffs into the lungs every 6 (six) hours as needed. Shortness of breath          . albuterol (PROVENTIL) (2.5 MG/3ML) 0.083% nebulizer solution   Nebulization   Take 2.5 mg by nebulization 3 (three) times daily as needed for shortness of breath. For shortness of breath         . aspirin EC 81 MG tablet   Oral   Take 81 mg by mouth daily.           . beclomethasone (QVAR) 40 MCG/ACT inhaler   Inhalation   Inhale 1 puff into the lungs 2 (two) times daily as needed (shortness of breath).          Marland Kitchen  benzonatate (TESSALON) 100 MG capsule   Oral   Take 200 mg by mouth 3 (three) times daily as needed for cough.         . furosemide (LASIX) 40 MG tablet   Oral   Take 40 mg by mouth daily.           Marland Kitchen ibuprofen (ADVIL,MOTRIN) 800 MG tablet   Oral   Take 800 mg by mouth 2 (two) times daily as needed for pain. Pain          . latanoprost (XALATAN) 0.005 % ophthalmic solution   Right Eye   Place 1 drop into the right eye at bedtime.         Marland Kitchen loratadine (CLARITIN) 10 MG tablet   Oral   Take 10 mg by mouth daily.          Marland Kitchen losartan-hydrochlorothiazide (HYZAAR) 100-25 MG per tablet   Oral   Take 1 tablet by mouth daily.         Marland Kitchen omeprazole (PRILOSEC) 40 MG capsule   Oral   Take 40 mg by mouth daily.          Marland Kitchen PARoxetine  (PAXIL) 40 MG tablet   Oral   Take 20 mg by mouth 2 (two) times daily.         . potassium chloride (K-DUR) 10 MEQ tablet   Oral   Take 10 mEq by mouth daily.         . traMADol (ULTRAM) 50 MG tablet   Oral   Take 50 mg by mouth every 6 (six) hours as needed for pain.          . verapamil (VERELAN PM) 120 MG 24 hr capsule   Oral   Take 120 mg by mouth every morning.         Marland Kitchen dextromethorphan-guaiFENesin (MUCINEX DM) 30-600 MG per 12 hr tablet   Oral   Take 1 tablet by mouth every 12 (twelve) hours.   14 tablet   1   . ergocalciferol (VITAMIN D2) 50000 UNITS capsule   Oral   Take 50,000 Units by mouth once a week. sunday         . fluticasone (FLONASE) 50 MCG/ACT nasal spray   Nasal   Place 2 sprays into the nose daily as needed for allergies.          . predniSONE (DELTASONE) 10 MG tablet   Oral   Take 4 tablets (40 mg total) by mouth daily.   20 tablet   0   . traMADol (ULTRAM) 50 MG tablet   Oral   Take 1 tablet (50 mg total) by mouth every 6 (six) hours as needed.   20 tablet   0    BP 120/66  Pulse 83  Temp(Src) 98.7 F (37.1 C) (Oral)  Resp 18  Ht 5\' 5"  (1.651 m)  Wt 280 lb (127.007 kg)  BMI 46.59 kg/m2  SpO2 95% Physical Exam  Nursing note and vitals reviewed. Constitutional: She is oriented to person, place, and time. She appears well-developed and well-nourished. No distress.  HENT:  Head: Normocephalic and atraumatic.  Mouth/Throat: Oropharynx is clear and moist.  Eyes: Conjunctivae and EOM are normal. Pupils are equal, round, and reactive to light.  Neck: Normal range of motion.  Cardiovascular: Normal rate, regular rhythm and normal heart sounds.   No murmur heard. Pulmonary/Chest: Effort normal and breath sounds normal. No respiratory distress. She has no wheezes. She exhibits tenderness.  Abdominal: Soft. Bowel sounds are normal. There is no tenderness.  Musculoskeletal: Normal range of motion. She exhibits no edema.   Tenderness to the right fourth toe no obvious deformity. Cap refill is normal throughout all the toes. Dorsalis pedis pulses 2+.  Neurological: She is alert and oriented to person, place, and time. No cranial nerve deficit. She exhibits normal muscle tone. Coordination normal.  Skin: Skin is warm. No rash noted. No erythema.    ED Course  Procedures (including critical care time) Labs Review Labs Reviewed - No data to display Imaging Review Dg Chest 2 View  05/23/2013   CLINICAL DATA:  Shortness of breath. Cough.  EXAM: CHEST  2 VIEW  COMPARISON:  Multiple priors.  FINDINGS: The heart size and mediastinal contours are within normal limits. Both lungs are clear. The visualized skeletal structures are unremarkable.  IMPRESSION: No active cardiopulmonary disease.   Electronically Signed   By: Jerene Dilling M.D.   On: 05/23/2013 18:39   Dg Foot Complete Right  05/23/2013   CLINICAL DATA:  Injured foot.  EXAM: RIGHT FOOT COMPLETE - 3+ VIEW  COMPARISON:  None.  FINDINGS: There is a slightly displaced oblique coursing fracture involving the distal shaft of the proximal phalanx of the 4th toe. No intra-articular involvement. The remaining bony structures are intact. The calcaneal heel spur is noted.  IMPRESSION: Minimally displaced fracture involving the distal shaft of the the 4th proximal phalanx.   Electronically Signed   By: Loralie Champagne M.D.   On: 05/23/2013 18:42    MDM   1. Bronchitis   2. Fracture of toe of right foot, closed, initial encounter    Patient with 2 separate complaints. One is that she's had a fairly significant cough for 3-4 days. Cough got significantly worse today after working with some cleaning fluids. Chest x-rays negative for pneumonia or pulmonary edema. Suspect upper respiratory infection with significant bronchitis. Patient improves some with albuterol inhalers in the ED and will start on prednisone first dose given here at 60 mg. Also patient with complaint of  injury to her big toe that occurred several days ago. X-ray does show a fracture of the fourth distal phalanx. Will treat with buddy tape and postop shoe.    Shelda Jakes, MD 05/23/13 [facility] [place]

## 2013-05-23 NOTE — ED Notes (Signed)
States she mixed ammonia and bleach together onset this am, inhaled this mixture and has had trouble breathing all day

## 2013-06-12 ENCOUNTER — Other Ambulatory Visit (HOSPITAL_COMMUNITY): Payer: Self-pay | Admitting: Internal Medicine

## 2013-06-18 ENCOUNTER — Other Ambulatory Visit (HOSPITAL_COMMUNITY): Payer: Self-pay | Admitting: Internal Medicine

## 2013-06-18 ENCOUNTER — Ambulatory Visit (HOSPITAL_COMMUNITY)
Admission: RE | Admit: 2013-06-18 | Discharge: 2013-06-18 | Disposition: A | Discharge status: Home / Self Care | Payer: Medicare Other | Source: Ambulatory Visit | Attending: Internal Medicine | Admitting: Internal Medicine

## 2013-06-18 DIAGNOSIS — M79609 Pain in unspecified limb: Secondary | ICD-10-CM | POA: Insufficient documentation

## 2013-06-27 ENCOUNTER — Other Ambulatory Visit: Payer: Self-pay

## 2013-12-12 ENCOUNTER — Other Ambulatory Visit: Payer: Self-pay

## 2013-12-12 ENCOUNTER — Encounter (HOSPITAL_COMMUNITY): Payer: Self-pay | Admitting: Emergency Medicine

## 2013-12-12 ENCOUNTER — Emergency Department (HOSPITAL_COMMUNITY): Payer: Medicare HMO

## 2013-12-12 ENCOUNTER — Emergency Department (HOSPITAL_COMMUNITY)
Admission: EM | Admit: 2013-12-12 | Discharge: 2013-12-12 | Disposition: A | Discharge status: Home / Self Care | Payer: Medicare HMO | Attending: Emergency Medicine | Admitting: Emergency Medicine

## 2013-12-12 DIAGNOSIS — Z8619 Personal history of other infectious and parasitic diseases: Secondary | ICD-10-CM | POA: Insufficient documentation

## 2013-12-12 DIAGNOSIS — Z79899 Other long term (current) drug therapy: Secondary | ICD-10-CM | POA: Insufficient documentation

## 2013-12-12 DIAGNOSIS — Z91040 Latex allergy status: Secondary | ICD-10-CM | POA: Insufficient documentation

## 2013-12-12 DIAGNOSIS — M79609 Pain in unspecified limb: Secondary | ICD-10-CM | POA: Insufficient documentation

## 2013-12-12 DIAGNOSIS — R079 Chest pain, unspecified: Secondary | ICD-10-CM | POA: Insufficient documentation

## 2013-12-12 DIAGNOSIS — R5381 Other malaise: Secondary | ICD-10-CM | POA: Insufficient documentation

## 2013-12-12 DIAGNOSIS — F17200 Nicotine dependence, unspecified, uncomplicated: Secondary | ICD-10-CM | POA: Insufficient documentation

## 2013-12-12 DIAGNOSIS — E119 Type 2 diabetes mellitus without complications: Secondary | ICD-10-CM | POA: Insufficient documentation

## 2013-12-12 DIAGNOSIS — Z8614 Personal history of Methicillin resistant Staphylococcus aureus infection: Secondary | ICD-10-CM | POA: Insufficient documentation

## 2013-12-12 DIAGNOSIS — M79603 Pain in arm, unspecified: Secondary | ICD-10-CM

## 2013-12-12 DIAGNOSIS — Z880 Allergy status to penicillin: Secondary | ICD-10-CM | POA: Insufficient documentation

## 2013-12-12 DIAGNOSIS — I10 Essential (primary) hypertension: Secondary | ICD-10-CM | POA: Insufficient documentation

## 2013-12-12 DIAGNOSIS — M25519 Pain in unspecified shoulder: Secondary | ICD-10-CM | POA: Insufficient documentation

## 2013-12-12 DIAGNOSIS — F3289 Other specified depressive episodes: Secondary | ICD-10-CM | POA: Insufficient documentation

## 2013-12-12 DIAGNOSIS — J45901 Unspecified asthma with (acute) exacerbation: Secondary | ICD-10-CM | POA: Insufficient documentation

## 2013-12-12 DIAGNOSIS — IMO0002 Reserved for concepts with insufficient information to code with codable children: Secondary | ICD-10-CM | POA: Insufficient documentation

## 2013-12-12 DIAGNOSIS — Z7982 Long term (current) use of aspirin: Secondary | ICD-10-CM | POA: Insufficient documentation

## 2013-12-12 DIAGNOSIS — R209 Unspecified disturbances of skin sensation: Secondary | ICD-10-CM | POA: Insufficient documentation

## 2013-12-12 DIAGNOSIS — R5383 Other fatigue: Secondary | ICD-10-CM

## 2013-12-12 DIAGNOSIS — F329 Major depressive disorder, single episode, unspecified: Secondary | ICD-10-CM | POA: Insufficient documentation

## 2013-12-12 DIAGNOSIS — M129 Arthropathy, unspecified: Secondary | ICD-10-CM | POA: Insufficient documentation

## 2013-12-12 LAB — COMPREHENSIVE METABOLIC PANEL
ALT: 62 U/L — ABNORMAL HIGH (ref 0–35)
AST: 81 U/L — ABNORMAL HIGH (ref 0–37)
Albumin: 2.9 g/dL — ABNORMAL LOW (ref 3.5–5.2)
Alkaline Phosphatase: 165 U/L — ABNORMAL HIGH (ref 39–117)
BUN: 13 mg/dL (ref 6–23)
CO2: 27 mEq/L (ref 19–32)
Calcium: 8.6 mg/dL (ref 8.4–10.5)
Chloride: 102 mEq/L (ref 96–112)
Creatinine, Ser: 0.92 mg/dL (ref 0.50–1.10)
GFR calc Af Amer: 74 mL/min — ABNORMAL LOW (ref >90)
GFR calc non Af Amer: 63 mL/min — ABNORMAL LOW (ref >90)
Glucose, Bld: 118 mg/dL — ABNORMAL HIGH (ref 70–99)
Potassium: 3.4 mEq/L — ABNORMAL LOW (ref 3.7–5.3)
Sodium: 140 mEq/L (ref 137–147)
Total Bilirubin: 0.5 mg/dL (ref 0.3–1.2)
Total Protein: 9.4 g/dL — ABNORMAL HIGH (ref 6.0–8.3)

## 2013-12-12 LAB — CBC WITH DIFFERENTIAL/PLATELET
Basophils Absolute: 0 10*3/uL (ref 0.0–0.1)
Basophils Relative: 0 % (ref 0–1)
Eosinophils Absolute: 0.2 10*3/uL (ref 0.0–0.7)
Eosinophils Relative: 2 % (ref 0–5)
HCT: 40.5 % (ref 36.0–46.0)
Hemoglobin: 12.9 g/dL (ref 12.0–15.0)
Lymphocytes Relative: 36 % (ref 12–46)
Lymphs Abs: 3.5 10*3/uL (ref 0.7–4.0)
MCH: 29.1 pg (ref 26.0–34.0)
MCHC: 31.9 g/dL (ref 30.0–36.0)
MCV: 91.4 fL (ref 78.0–100.0)
Monocytes Absolute: 1 10*3/uL (ref 0.1–1.0)
Monocytes Relative: 10 % (ref 3–12)
Neutro Abs: 5.1 10*3/uL (ref 1.7–7.7)
Neutrophils Relative %: 52 % (ref 43–77)
Platelets: 241 10*3/uL (ref 150–400)
RBC: 4.43 MIL/uL (ref 3.87–5.11)
RDW: 12.9 % (ref 11.5–15.5)
WBC: 9.8 10*3/uL (ref 4.0–10.5)

## 2013-12-12 LAB — URINALYSIS, ROUTINE W REFLEX MICROSCOPIC
Bilirubin Urine: NEGATIVE
Glucose, UA: NEGATIVE mg/dL
Hgb urine dipstick: NEGATIVE
Ketones, ur: NEGATIVE mg/dL
Leukocytes, UA: NEGATIVE
Nitrite: NEGATIVE
Protein, ur: NEGATIVE mg/dL
Specific Gravity, Urine: 1.02 (ref 1.005–1.030)
Urobilinogen, UA: 4 mg/dL — ABNORMAL HIGH (ref 0.0–1.0)
pH: 6 (ref 5.0–8.0)

## 2013-12-12 LAB — TROPONIN I: Troponin I: <0.3 ng/mL (ref <0.30)

## 2013-12-12 LAB — PRO B NATRIURETIC PEPTIDE: Pro B Natriuretic peptide (BNP): 64.3 pg/mL (ref 0–125)

## 2013-12-12 MED ORDER — OXYCODONE-ACETAMINOPHEN 5-325 MG PO TABS
2.0000 | ORAL_TABLET | Freq: Once | ORAL | Status: AC
Start: 2013-12-12 — End: 2013-12-12
  Administered 2013-12-12: 2 via ORAL
  Filled 2013-12-12: qty 2

## 2013-12-12 MED ORDER — OXYCODONE-ACETAMINOPHEN 5-325 MG PO TABS: 1.0000 | ORAL_TABLET | Freq: Four times a day (QID) | ORAL | Status: DC | PRN

## 2013-12-12 NOTE — ED Notes (Signed)
Patient would like something for pain and to help her sleep. RN made aware

## 2013-12-12 NOTE — ED Notes (Signed)
Pt complaining of left sided chest pain and right side shoulder and arm pain.

## 2013-12-12 NOTE — Discharge Instructions (Signed)
Chest Pain (Nonspecific) °It is often hard to give a specific diagnosis for the cause of chest pain. There is always a chance that your pain could be related to something serious, such as a heart attack or a blood clot in the lungs. You need to follow up with your caregiver for further evaluation. °CAUSES  °· Heartburn. °· Pneumonia or bronchitis. °· Anxiety or stress. °· Inflammation around your heart (pericarditis) or lung (pleuritis or pleurisy). °· A blood clot in the lung. °· A collapsed lung (pneumothorax). It can develop suddenly on its own (spontaneous pneumothorax) or from injury (trauma) to the chest. °· Shingles infection (herpes zoster virus). °The chest wall is composed of bones, muscles, and cartilage. Any of these can be the source of the pain. °· The bones can be bruised by injury. °· The muscles or cartilage can be strained by coughing or overwork. °· The cartilage can be affected by inflammation and become sore (costochondritis). °DIAGNOSIS  °Lab tests or other studies, such as X-rays, electrocardiography, stress testing, or cardiac imaging, may be needed to find the cause of your pain.  °TREATMENT  °· Treatment depends on what may be causing your chest pain. Treatment may include: °· Acid blockers for heartburn. °· Anti-inflammatory medicine. °· Pain medicine for inflammatory conditions. °· Antibiotics if an infection is present. °· You may be advised to change lifestyle habits. This includes stopping smoking and avoiding alcohol, caffeine, and chocolate. °· You may be advised to keep your head raised (elevated) when sleeping. This reduces the chance of acid going backward from your stomach into your esophagus. °· Most of the time, nonspecific chest pain will improve within 2 to 3 days with rest and mild pain medicine. °HOME CARE INSTRUCTIONS  °· If antibiotics were prescribed, take your antibiotics as directed. Finish them even if you start to feel better. °· For the next few days, avoid physical  activities that bring on chest pain. Continue physical activities as directed. °· Do not smoke. °· Avoid drinking alcohol. °· Only take over-the-counter or prescription medicine for pain, discomfort, or fever as directed by your caregiver. °· Follow your caregiver's suggestions for further testing if your chest pain does not go away. °· Keep any follow-up appointments you made. If you do not go to an appointment, you could develop lasting (chronic) problems with pain. If there is any problem keeping an appointment, you must call to reschedule. °SEEK MEDICAL CARE IF:  °· You think you are having problems from the medicine you are taking. Read your medicine instructions carefully. °· Your chest pain does not go away, even after treatment. °· You develop a rash with blisters on your chest. °SEEK IMMEDIATE MEDICAL CARE IF:  °· You have increased chest pain or pain that spreads to your arm, neck, jaw, back, or abdomen. °· You develop shortness of breath, an increasing cough, or you are coughing up blood. °· You have severe back or abdominal pain, feel nauseous, or vomit. °· You develop severe weakness, fainting, or chills. °· You have a fever. °THIS IS AN EMERGENCY. Do not wait to see if the pain will go away. Get medical help at once. Call your local emergency services (911 in [place] Do not drive yourself to the hospital. °MAKE SURE YOU:  °· Understand these instructions. °· Will watch your condition. °· Will get help right away if you are not doing well or get worse. °Document Released: 05/18/2005 Document Revised: 10/31/2011 Document Reviewed: 03/13/2008 °[employer]® Patient Information ©2014 [employer].  Shoulder Pain The shoulder is the joint that connects your arms to your body. The bones that form the shoulder joint include the upper arm bone (humerus), the shoulder blade (scapula), and the collarbone (clavicle). The top of the humerus is shaped like a ball and fits into a rather flat socket on the scapula  (glenoid cavity). A combination of muscles and strong, fibrous tissues that connect muscles to bones (tendons) support your shoulder joint and hold the ball in the socket. Small, fluid-filled sacs (bursae) are located in different areas of the joint. They act as cushions between the bones and the overlying soft tissues and help reduce friction between the gliding tendons and the bone as you move your arm. Your shoulder joint allows a wide range of motion in your arm. This range of motion allows you to do things like scratch your back or throw a ball. However, this range of motion also makes your shoulder more prone to pain from overuse and injury. Causes of shoulder pain can originate from both injury and overuse and usually can be grouped in the following four categories:  Redness, swelling, and pain (inflammation) of the tendon (tendinitis) or the bursae (bursitis).  Instability, such as a dislocation of the joint.  Inflammation of the joint (arthritis).  Broken bone (fracture). HOME CARE INSTRUCTIONS   Apply ice to the sore area.  Put ice in a plastic bag.  Place a towel between your skin and the bag.  Leave the ice on for 15-20 minutes, 03-04 times per day for the first 2 days.  Stop using cold packs if they do not help with the pain.  If you have a shoulder sling or immobilizer, wear it as long as your caregiver instructs. Only remove it to shower or bathe. Move your arm as little as possible, but keep your hand moving to prevent swelling.  Squeeze a soft ball or foam pad as much as possible to help prevent swelling.  Only take over-the-counter or prescription medicines for pain, discomfort, or fever as directed by your caregiver. SEEK MEDICAL CARE IF:   Your shoulder pain increases, or new pain develops in your arm, hand, or fingers.  Your hand or fingers become cold and numb.  Your pain is not relieved with medicines. SEEK IMMEDIATE MEDICAL CARE IF:   Your arm, hand, or  fingers are numb or tingling.  Your arm, hand, or fingers are significantly swollen or turn white or blue. MAKE SURE YOU:   Understand these instructions.  Will watch your condition.  Will get help right away if you are not doing well or get worse. Document Released: 05/18/2005 Document Revised: 05/02/2012 Document Reviewed: 07/23/2011 [facility] Patient Information 2014 [employer].

## 2013-12-12 NOTE — ED Provider Notes (Signed)
CSN: [id number]     Arrival date & time 12/12/13  1244 History  This chart was scribed for [employer]. Alvino Chapel, MD by Roxan Diesel, ED scribe.  This patient was seen in room [facility]05[facility] and the patient's care was started at 1:21 PM.   Chief Complaint  Patient presents with  . Chest Pain  . Arm Pain    The history is provided by the patient. No language interpreter was used.    HPI Comments: Megan Odom is a 68 y.o. female with h/o DM, HTN, and hyperlipidemia who presents to the Emergency Department complaining of right shoulder and arm pain that has been occurring intermittently for 3 months.  Pt states pain radiates from her shoulder down her arm into all five fingers.  It is worsened by moving her arm.  She is able to move the arm but states it occasionally feels weak or numb.  Pt admits to h/o similar pain and states:  "the last time my shoulder hurt like that they x-rayed it and found a knot in my side that had MRSA in it."  She states her current pain feels slightly similar.  She states she has also has felt nauseated recently  She denies fever, new cough or SOB.  Pt also complains of intermittent left-sided chest pain that began one month ago.  Pain is not brought on by anything in particular and seems to come and go on its own.  It is not affected by eating.  She denies injury.  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    Past Medical History  Diagnosis Date  . Hypertension   . Asthma   . Arthritis   . Hep C w/o coma, chronic   . Type 2 diabetes mellitus   . Hyperlipemia   . Depression     Past Surgical History  Procedure Laterality Date  . Cholecystectomy    . Appendectomy    . Abdominal hysterectomy    . Breast surgery      Family History  Problem Relation Age of Onset  . Arthritis    . Cancer    . Asthma    . Diabetes      History  Substance Use Topics  . Smoking status: Current Every Day Smoker -- 0.50 packs/day for 50  years  . Smokeless tobacco: Not on file     Comment: smoking for the past 50 years  . Alcohol Use: No    OB History   Grav Para Term Preterm Abortions TAB SAB Ect Mult Living   4 2 2  2  2   2        Review of Systems  Constitutional: Negative for fever.  Respiratory: Positive for cough (chronic due to asthma) and shortness of breath (chronic due to asthma).   Cardiovascular: Positive for chest pain.  Musculoskeletal:       Right shoulder and arm pain  Neurological: Positive for weakness and numbness.      Allergies  Darvocet; Latex; Other; Penicillins; Tetracyclines & related; and Adhesive  Home Medications   Prior to Admission medications   Medication Sig Start Date End Date Taking? Authorizing Provider  albuterol (PROVENTIL HFA;VENTOLIN HFA) 108 (90 BASE) MCG/ACT inhaler Inhale 2 puffs into the lungs every 6 (six) hours as needed. Shortness of breath     Historical Provider, MD  albuterol (PROVENTIL) (2.5 MG/3ML) 0.083% nebulizer solution Take 2.5 mg by nebulization 3 (three) times daily as  needed for shortness of breath. For shortness of breath    Historical Provider, MD  aspirin EC 81 MG tablet Take 81 mg by mouth daily.      Historical Provider, MD  beclomethasone (QVAR) 40 MCG/ACT inhaler Inhale 1 puff into the lungs 2 (two) times daily as needed (shortness of breath).     Historical Provider, MD  benzonatate (TESSALON) 100 MG capsule Take 200 mg by mouth 3 (three) times daily as needed for cough.    Historical Provider, MD  dextromethorphan-guaiFENesin (MUCINEX DM) 30-600 MG per 12 hr tablet Take 1 tablet by mouth every 12 (twelve) hours. 05/23/13   Mervin Kung, MD  ergocalciferol (VITAMIN D2) 50000 UNITS capsule Take 50,000 Units by mouth once a week. sunday    Historical Provider, MD  fluticasone (FLONASE) 50 MCG/ACT nasal spray Place 2 sprays into the nose daily as needed for allergies.     Historical Provider, MD  furosemide (LASIX) 40 MG tablet Take 40 mg by  mouth daily.      Historical Provider, MD  ibuprofen (ADVIL,MOTRIN) 800 MG tablet Take 800 mg by mouth 2 (two) times daily as needed for pain. Pain     Historical Provider, MD  latanoprost (XALATAN) 0.005 % ophthalmic solution Place 1 drop into the right eye at bedtime.    Historical Provider, MD  loratadine (CLARITIN) 10 MG tablet Take 10 mg by mouth daily.     Historical Provider, MD  losartan-hydrochlorothiazide (HYZAAR) 100-25 MG per tablet Take 1 tablet by mouth daily.    Historical Provider, MD  omeprazole (PRILOSEC) 40 MG capsule Take 40 mg by mouth daily.     Historical Provider, MD  PARoxetine (PAXIL) 40 MG tablet Take 20 mg by mouth 2 (two) times daily.    Historical Provider, MD  potassium chloride (K-DUR) 10 MEQ tablet Take 10 mEq by mouth daily.    Historical Provider, MD  predniSONE (DELTASONE) 10 MG tablet Take 4 tablets (40 mg total) by mouth daily. 05/23/13   Mervin Kung, MD  traMADol (ULTRAM) 50 MG tablet Take 50 mg by mouth every 6 (six) hours as needed for pain.  04/15/13   Historical Provider, MD  traMADol (ULTRAM) 50 MG tablet Take 1 tablet (50 mg total) by mouth every 6 (six) hours as needed. 05/23/13   Mervin Kung, MD  verapamil (VERELAN PM) 120 MG 24 hr capsule Take 120 mg by mouth every morning.    Historical Provider, MD   BP 155/75  Pulse 61  Temp(Src) 97.9 F (36.6 C) (Oral)  Resp 22  Ht 5\' 5"  (1.651 m)  Wt 276 lb (125.193 kg)  BMI 45.93 kg/m2  SpO2 97%  Physical Exam  Nursing note and vitals reviewed. Constitutional: She is oriented to person, place, and time. She appears well-developed and well-nourished. No distress.  Awake, alert, non-distressed  HENT:  Head: Normocephalic and atraumatic.  Eyes: EOM are normal.  Neck: Neck supple. No tracheal deviation present.  Cardiovascular: Normal rate.   Pulmonary/Chest: Effort normal and breath sounds normal. No respiratory distress. She has no wheezes. She has no rales. She exhibits no tenderness.   Abdominal: There is tenderness. There is no rebound and no guarding.  Mild upper abdominal tenderness without rebound or guarding Scar on right mid-abdomen with some crusting. No drainage.  Musculoskeletal: She exhibits edema and tenderness.  Pain with movememt and palpation of right shoulder and right axillary area.  There is a scar from previous surgery. Mild tenderness but  no fluctuance or erythema. Mild bilateral lower extremity pitting edema  Neurological: She is alert and oriented to person, place, and time.  Sensation and strength grossly intact in right hand.  Skin: Skin is warm and dry.  Psychiatric: She has a normal mood and affect. Her behavior is normal.    ED Course  Procedures (including critical care time)   COORDINATION OF CARE: 1:31 PM-Discussed treatment plan which includes labs and imaging with pt at bedside and pt agreed to plan.     Labs Review Labs Reviewed  COMPREHENSIVE METABOLIC PANEL - Abnormal; Notable for the following:    Potassium 3.4 (*)    Glucose, Bld 118 (*)    Total Protein 9.4 (*)    Albumin 2.9 (*)    AST 81 (*)    ALT 62 (*)    Alkaline Phosphatase 165 (*)    GFR calc non Af Amer 63 (*)    GFR calc Af Amer 74 (*)    All other components within normal limits  URINALYSIS, ROUTINE W REFLEX MICROSCOPIC - Abnormal; Notable for the following:    Urobilinogen, UA 4.0 (*)    All other components within normal limits  CBC WITH DIFFERENTIAL  TROPONIN I  PRO B NATRIURETIC PEPTIDE    Imaging Review Dg Chest 2 View  12/12/2013   CLINICAL DATA:  Left chest pain.  Right shoulder and arm pain.  EXAM: CHEST  2 VIEW  COMPARISON:  05/23/2013  FINDINGS: The heart size and mediastinal contours are within normal limits. Both lungs are clear. The visualized skeletal structures are unremarkable.  IMPRESSION: No active cardiopulmonary disease.   Electronically Signed   By: Sherryl Barters M.D.   On: 12/12/2013 14:06   Dg Shoulder Right  12/12/2013    CLINICAL DATA:  Left chest pain. Right shoulder pain. Right arm pain.  EXAM: RIGHT SHOULDER - 2+ VIEW  COMPARISON:  None.  FINDINGS: Body habitus reduces diagnostic sensitivity and specificity.  Glenohumeral alignment normal. No fracture or acute bony findings observed.  IMPRESSION: 1. No acute findings.   Electronically Signed   By: Sherryl Barters M.D.   On: 12/12/2013 13:59     EKG Interpretation None      Date: 12/12/2013  Rate: 63  Rhythm: normal sinus rhythm and premature atrial contractions (PAC)  QRS Axis: normal  Intervals: normal  ST/T Wave abnormalities: normal  Conduction Disutrbances:none  Narrative Interpretation:   Old EKG Reviewed: unchanged   MDM   Final diagnoses:  Shoulder pain  Chest pain  Arm pain    Patient with chest pain and arm pain. Has had both previously. EKG is reassuring. X-ray reassuring. Doubt DVT in the arm. No swelling of the arm. Doubt large abscess like last time she states she had pain like this. Will discharge home with some pain medicines to followup with her primary care Dr.    I personally performed the services described in this documentation, which was scribed in my presence. The recorded information has been reviewed and is accurate.     Jasper Riling. Alvino Chapel, MD 12/12/13 2201

## 2013-12-25 ENCOUNTER — Emergency Department (HOSPITAL_COMMUNITY): Payer: Medicare HMO

## 2013-12-25 ENCOUNTER — Encounter (HOSPITAL_COMMUNITY): Payer: Self-pay | Admitting: Emergency Medicine

## 2013-12-25 ENCOUNTER — Emergency Department (HOSPITAL_COMMUNITY)
Admission: EM | Admit: 2013-12-25 | Discharge: 2013-12-25 | Disposition: A | Discharge status: Home / Self Care | Payer: Medicare HMO | Attending: Emergency Medicine | Admitting: Emergency Medicine

## 2013-12-25 DIAGNOSIS — R110 Nausea: Secondary | ICD-10-CM | POA: Insufficient documentation

## 2013-12-25 DIAGNOSIS — F329 Major depressive disorder, single episode, unspecified: Secondary | ICD-10-CM | POA: Insufficient documentation

## 2013-12-25 DIAGNOSIS — M129 Arthropathy, unspecified: Secondary | ICD-10-CM | POA: Insufficient documentation

## 2013-12-25 DIAGNOSIS — F17200 Nicotine dependence, unspecified, uncomplicated: Secondary | ICD-10-CM | POA: Insufficient documentation

## 2013-12-25 DIAGNOSIS — J45901 Unspecified asthma with (acute) exacerbation: Secondary | ICD-10-CM | POA: Insufficient documentation

## 2013-12-25 DIAGNOSIS — Z79899 Other long term (current) drug therapy: Secondary | ICD-10-CM | POA: Insufficient documentation

## 2013-12-25 DIAGNOSIS — Z8619 Personal history of other infectious and parasitic diseases: Secondary | ICD-10-CM | POA: Insufficient documentation

## 2013-12-25 DIAGNOSIS — Z91040 Latex allergy status: Secondary | ICD-10-CM | POA: Insufficient documentation

## 2013-12-25 DIAGNOSIS — E119 Type 2 diabetes mellitus without complications: Secondary | ICD-10-CM | POA: Insufficient documentation

## 2013-12-25 DIAGNOSIS — R040 Epistaxis: Secondary | ICD-10-CM | POA: Insufficient documentation

## 2013-12-25 DIAGNOSIS — R300 Dysuria: Secondary | ICD-10-CM | POA: Insufficient documentation

## 2013-12-25 DIAGNOSIS — Z880 Allergy status to penicillin: Secondary | ICD-10-CM | POA: Insufficient documentation

## 2013-12-25 DIAGNOSIS — I10 Essential (primary) hypertension: Secondary | ICD-10-CM | POA: Insufficient documentation

## 2013-12-25 DIAGNOSIS — F3289 Other specified depressive episodes: Secondary | ICD-10-CM | POA: Insufficient documentation

## 2013-12-25 DIAGNOSIS — R519 Headache, unspecified: Secondary | ICD-10-CM

## 2013-12-25 DIAGNOSIS — R51 Headache: Secondary | ICD-10-CM | POA: Insufficient documentation

## 2013-12-25 DIAGNOSIS — R509 Fever, unspecified: Secondary | ICD-10-CM | POA: Insufficient documentation

## 2013-12-25 DIAGNOSIS — R197 Diarrhea, unspecified: Secondary | ICD-10-CM | POA: Insufficient documentation

## 2013-12-25 DIAGNOSIS — IMO0002 Reserved for concepts with insufficient information to code with codable children: Secondary | ICD-10-CM | POA: Insufficient documentation

## 2013-12-25 DIAGNOSIS — Z7982 Long term (current) use of aspirin: Secondary | ICD-10-CM | POA: Insufficient documentation

## 2013-12-25 LAB — CBC WITH DIFFERENTIAL/PLATELET
Basophils Absolute: 0 10*3/uL (ref 0.0–0.1)
Basophils Relative: 0 % (ref 0–1)
Eosinophils Absolute: 0.3 10*3/uL (ref 0.0–0.7)
Eosinophils Relative: 3 % (ref 0–5)
HCT: 38.3 % (ref 36.0–46.0)
Hemoglobin: 12.7 g/dL (ref 12.0–15.0)
Lymphocytes Relative: 40 % (ref 12–46)
Lymphs Abs: 4 10*3/uL (ref 0.7–4.0)
MCH: 30 pg (ref 26.0–34.0)
MCHC: 33.2 g/dL (ref 30.0–36.0)
MCV: 90.5 fL (ref 78.0–100.0)
Monocytes Absolute: 0.6 10*3/uL (ref 0.1–1.0)
Monocytes Relative: 6 % (ref 3–12)
Neutro Abs: 5.2 10*3/uL (ref 1.7–7.7)
Neutrophils Relative %: 51 % (ref 43–77)
Platelets: 238 10*3/uL (ref 150–400)
RBC: 4.23 MIL/uL (ref 3.87–5.11)
RDW: 13 % (ref 11.5–15.5)
WBC: 10.1 10*3/uL (ref 4.0–10.5)

## 2013-12-25 LAB — BASIC METABOLIC PANEL
BUN: 15 mg/dL (ref 6–23)
CO2: 24 mEq/L (ref 19–32)
Calcium: 8.6 mg/dL (ref 8.4–10.5)
Chloride: 102 mEq/L (ref 96–112)
Creatinine, Ser: 0.83 mg/dL (ref 0.50–1.10)
GFR calc Af Amer: 83 mL/min — ABNORMAL LOW (ref >90)
GFR calc non Af Amer: 72 mL/min — ABNORMAL LOW (ref >90)
Glucose, Bld: 93 mg/dL (ref 70–99)
Potassium: 3.7 mEq/L (ref 3.7–5.3)
Sodium: 137 mEq/L (ref 137–147)

## 2013-12-25 MED ORDER — HYDROCODONE-ACETAMINOPHEN 5-325 MG PO TABS
1.0000 | ORAL_TABLET | Freq: Once | ORAL | Status: AC
  Administered 2013-12-25: 1 via ORAL
  Filled 2013-12-25: qty 1

## 2013-12-25 MED ORDER — ONDANSETRON 4 MG PO TBDP
4.0000 mg | ORAL_TABLET | Freq: Once | ORAL | Status: AC
  Administered 2013-12-25: 4 mg via ORAL
  Filled 2013-12-25: qty 1

## 2013-12-25 MED ORDER — BACITRACIN ZINC 500 UNIT/GM EX OINT
TOPICAL_OINTMENT | Freq: Once | CUTANEOUS | Status: AC
  Administered 2013-12-25: 1 via TOPICAL
  Filled 2013-12-25: qty 0.9

## 2013-12-25 MED ORDER — HYDROCODONE-ACETAMINOPHEN 5-325 MG PO TABS: 1.0000 | ORAL_TABLET | Freq: Four times a day (QID) | ORAL | Status: DC | PRN

## 2013-12-25 NOTE — ED Notes (Signed)
Pt requesting pain medication. Dr. Rogene Houston notified.

## 2013-12-25 NOTE — ED Provider Notes (Addendum)
CSN: [id number]     Arrival date & time 12/25/13  1714 History  This chart was scribed for Mervin Kung, MD by Zettie Pho, ED Scribe. This patient was seen in room [facility]03/[facility]03 and the patient's care was started at 6:05 PM.    Chief Complaint  Patient presents with  . Epistaxis   Patient is a 68 y.o. female presenting with nosebleeds. The history is provided by the patient. No language interpreter was used.  Epistaxis Location:  Bilateral Severity:  Moderate Timing:  Intermittent Progression:  Unchanged Chronicity:  New Context: aspirin use and hypertension   Context: not trauma   Relieved by:  Nothing Worsened by:  Nothing tried Ineffective treatments:  None tried Associated symptoms: fever (subjective) and headaches   Associated symptoms: no cough, no sneezing and no sore throat   Fever:    Timing:  Intermittent   Temp source:  Subjective   Progression:  Unchanged Headaches:    Severity:  Moderate   Onset quality:  Gradual   Timing:  Constant   Progression:  Unchanged   Chronicity:  New Risk factors: no frequent nosebleeds    HPI Comments: Megan Odom is a 68 y.o. Female with a history of hepatitis C and HTN who presents to the Emergency Department complaining of epistaxis from the bilateral nares, worse on the left, that drains down the back of the throat onset this morning around 12 hours ago. She denies any potential injury or trauma to the nose or frequent sneezing. She reports an associated, sharp, left-sided headache, rated 8/10 currently, with a subjective fever (patient is afebrile at 98 in the ED). She reports some nausea with burning dysuria and a few episodes of watery diarrhea this morning. Patient reports some shortness of breath that she states is chronic in nature. Patient takes 81 mg of aspirin daily, but denies other anticoagulant medication use. She denies cough, rhinorrhea, sore throat, chest pain, abdominal pain, vomiting, rash, leg swelling. Patient  has allergies to Darvocet, penicillins, tetracyclines and related drugs. Patient also has a history of hyperlipidemia and type II DM.   Past Medical History  Diagnosis Date  . Hypertension   . Asthma   . Arthritis   . Hep C w/o coma, chronic   . Type 2 diabetes mellitus   . Hyperlipemia   . Depression    Past Surgical History  Procedure Laterality Date  . Cholecystectomy    . Appendectomy    . Abdominal hysterectomy    . Breast surgery     Family History  Problem Relation Age of Onset  . Arthritis    . Cancer    . Asthma    . Diabetes     History  Substance Use Topics  . Smoking status: Current Every Day Smoker -- 0.50 packs/day for 50 years  . Smokeless tobacco: Not on file     Comment: smoking for the past 50 years  . Alcohol Use: No   OB History   Grav Para Term Preterm Abortions TAB SAB Ect Mult Living   4 2 2  2  2   2      Review of Systems  Constitutional: Positive for fever (subjective). Negative for chills.  HENT: Positive for nosebleeds. Negative for rhinorrhea, sneezing and sore throat.   Eyes: Negative for visual disturbance.  Respiratory: Positive for shortness of breath. Negative for cough.   Cardiovascular: Negative for chest pain.  Gastrointestinal: Positive for nausea and diarrhea. Negative for vomiting and abdominal  pain.  Genitourinary: Positive for dysuria. Negative for hematuria.  Musculoskeletal: Negative for back pain and neck pain.  Skin: Negative for rash.  Neurological: Positive for headaches.  Hematological: Does not bruise/bleed easily.  Psychiatric/Behavioral: Negative for confusion.      Allergies  Darvocet; Latex; Other; Penicillins; Tetracyclines & related; and Adhesive  Home Medications   Prior to Admission medications   Medication Sig Start Date End Date Taking? Authorizing Provider  albuterol (PROVENTIL HFA;VENTOLIN HFA) 108 (90 BASE) MCG/ACT inhaler Inhale 2 puffs into the lungs every 6 (six) hours as needed. Shortness  of breath     Historical Provider, MD  albuterol (PROVENTIL) (2.5 MG/3ML) 0.083% nebulizer solution Take 2.5 mg by nebulization 3 (three) times daily as needed for shortness of breath. For shortness of breath    Historical Provider, MD  aspirin EC 81 MG tablet Take 81 mg by mouth daily.      Historical Provider, MD  beclomethasone (QVAR) 40 MCG/ACT inhaler Inhale 1 puff into the lungs 2 (two) times daily as needed (shortness of breath).     Historical Provider, MD  benzonatate (TESSALON) 100 MG capsule Take 200 mg by mouth 3 (three) times daily as needed for cough.    Historical Provider, MD  dextromethorphan-guaiFENesin (MUCINEX DM) 30-600 MG per 12 hr tablet Take 1 tablet by mouth every 12 (twelve) hours. 05/23/13   Mervin Kung, MD  ergocalciferol (VITAMIN D2) 50000 UNITS capsule Take 50,000 Units by mouth once a week. sunday    Historical Provider, MD  fluticasone (FLONASE) 50 MCG/ACT nasal spray Place 2 sprays into the nose daily as needed for allergies.     Historical Provider, MD  furosemide (LASIX) 40 MG tablet Take 40 mg by mouth daily.      Historical Provider, MD  ibuprofen (ADVIL,MOTRIN) 800 MG tablet Take 800 mg by mouth 2 (two) times daily as needed for pain. Pain     Historical Provider, MD  latanoprost (XALATAN) 0.005 % ophthalmic solution Place 1 drop into the right eye at bedtime.    Historical Provider, MD  loratadine (CLARITIN) 10 MG tablet Take 10 mg by mouth daily.     Historical Provider, MD  losartan-hydrochlorothiazide (HYZAAR) 100-25 MG per tablet Take 1 tablet by mouth daily.    Historical Provider, MD  omeprazole (PRILOSEC) 40 MG capsule Take 40 mg by mouth daily.     Historical Provider, MD  oxyCODONE-acetaminophen (PERCOCET/ROXICET) 5-325 MG per tablet Take 1-2 tablets by mouth every 6 (six) hours as needed for severe pain. 12/12/13   Jasper Riling. Pickering, MD  PARoxetine (PAXIL) 20 MG tablet Take 20 mg by mouth 2 (two) times daily.    Historical Provider, MD   potassium chloride (K-DUR) 10 MEQ tablet Take 10 mEq by mouth daily.    Historical Provider, MD  traMADol (ULTRAM) 50 MG tablet Take 50 mg by mouth 2 (two) times daily.  04/15/13   Historical Provider, MD  verapamil (VERELAN PM) 120 MG 24 hr capsule Take 120 mg by mouth every morning.    Historical Provider, MD   Triage Vitals: BP 142/80  Pulse 64  Temp(Src) 98 F (36.7 C) (Oral)  Resp 18  Ht 5' 5.5" (1.664 m)  Wt 276 lb (125.193 kg)  BMI 45.21 kg/m2  SpO2 97%  Physical Exam  Nursing note and vitals reviewed. Constitutional: She is oriented to person, place, and time. She appears well-developed and well-nourished. No distress.  HENT:  Head: Normocephalic and atraumatic.  Mouth/Throat: Oropharynx is  clear and moist. No oropharyngeal exudate.  Blood draining down the back of the throat. No blood in the right nares, but there is blood in the left nares. No clots visualized.   Eyes: Conjunctivae and EOM are normal.  Neck: Normal range of motion. Neck supple.  Cardiovascular: Normal rate, regular rhythm and normal heart sounds.   Pulmonary/Chest: Effort normal and breath sounds normal. No respiratory distress.  Abdominal: Soft. She exhibits no distension. There is no tenderness.  Musculoskeletal: Normal range of motion. She exhibits no edema.  Neurological: She is alert and oriented to person, place, and time. No cranial nerve deficit. She exhibits normal muscle tone. Coordination normal.  Skin: Skin is warm and dry.  Psychiatric: She has a normal mood and affect. Her behavior is normal.    ED Course  EPISTAXIS MANAGEMENT Date/Time: 12/25/2013 8:52 PM Performed by: Mervin Kung. Authorized by: Mervin Kung Consent: Verbal consent obtained. written consent not obtained. Risks and benefits: risks, benefits and alternatives were discussed Consent given by: patient Required items: required blood products, implants, devices, and special equipment available Patient identity  confirmed: verbally with patient and arm band Time out: Immediately prior to procedure a "time out" was called to verify the correct patient, procedure, equipment, support staff and site/side marked as required. Patient sedated: no Treatment site: left anterior Repair method: merocel sponge, nasal balloon and anterior pack Post-procedure assessment: bleeding stopped Treatment complexity: complex Patient tolerance: Patient tolerated the procedure well with no immediate complications.   (including critical care time)     DIAGNOSTIC STUDIES: Oxygen Saturation is 97% on room air, normal by my interpretation.    COORDINATION OF CARE: 6:11 PM- Ordered a head CT, CBC, BMP. Discussed treatment plan with patient at bedside and patient verbalized agreement.   Results for orders placed during the hospital encounter of 12/25/13  CBC WITH DIFFERENTIAL      Result Value Ref Range   WBC 10.1  4.0 - 10.5 K/uL   RBC 4.23  3.87 - 5.11 MIL/uL   Hemoglobin 12.7  12.0 - 15.0 g/dL   HCT 38.3  36.0 - 46.0 %   MCV 90.5  78.0 - 100.0 fL   MCH 30.0  26.0 - 34.0 pg   MCHC 33.2  30.0 - 36.0 g/dL   RDW 13.0  11.5 - 15.5 %   Platelets 238  150 - 400 K/uL   Neutrophils Relative % 51  43 - 77 %   Lymphocytes Relative 40  12 - 46 %   Monocytes Relative 6  3 - 12 %   Eosinophils Relative 3  0 - 5 %   Basophils Relative 0  0 - 1 %   Neutro Abs 5.2  1.7 - 7.7 K/uL   Lymphs Abs 4.0  0.7 - 4.0 K/uL   Monocytes Absolute 0.6  0.1 - 1.0 K/uL   Eosinophils Absolute 0.3  0.0 - 0.7 K/uL   Basophils Absolute 0.0  0.0 - 0.1 K/uL   WBC Morphology ATYPICAL LYMPHOCYTES     Smear Review LARGE PLATELETS PRESENT    BASIC METABOLIC PANEL      Result Value Ref Range   Sodium 137  137 - 147 mEq/L   Potassium 3.7  3.7 - 5.3 mEq/L   Chloride 102  96 - 112 mEq/L   CO2 24  19 - 32 mEq/L   Glucose, Bld 93  70 - 99 mg/dL   BUN 15  6 - 23 mg/dL   Creatinine,  Ser 0.83  0.50 - 1.10 mg/dL   Calcium 8.6  8.4 - 10.5 mg/dL   GFR  calc non Af Amer 72 (*) >90 mL/min   GFR calc Af Amer 83 (*) >90 mL/min   Ct Head Wo Contrast  12/25/2013   CLINICAL DATA:  Epistaxis, headache  EXAM: CT HEAD WITHOUT CONTRAST  TECHNIQUE: Contiguous axial images were obtained from the base of the skull through the vertex without intravenous contrast.  COMPARISON:  02/13/2013  FINDINGS: Normal ventricular morphology.  No midline shift or mass effect.  Normal appearance of brain parenchyma.  No intracranial hemorrhage, mass lesion or evidence acute infarction.  No extra-axial fluid collections.  Atherosclerotic calcification of internal carotid and vertebral arteries at skullbase.  Nasal septal deviation to the LEFT.  Bones and sinuses otherwise unremarkable.  BILATERAL parotid masses again identified, ranging from low to high attenuation, up to 2.5 cm greatest dimension.  IMPRESSION: No acute intracranial abnormalities.  BILATERAL parotid masses largest 2.5 cm greatest size, little changed from previous exam the; these have been been present since 2011, recommend clinical correlation.   Electronically Signed   By: Lavonia Dana M.D.   On: 12/25/2013 19:50        EKG Interpretation None      MDM   Final diagnoses:  Left-sided epistaxis  Headache    Patient with persistent left nosebleed appeared to be anterior on exam started this morning's been continued on and off all day. No evidence of any bleeding from the right nares. Rhino Rocket was placed as per procedure above. It was coated with bacitracin ointment prior placement. Balloon was inflated. Patient observed to make sure bleeding it stopped. Patient referred to ear nose and throat. Workup of the headache without any significant findings. No significant blood loss based on hemoglobin and hematocrit no significant leukocytosis. No significant electrolyte abnormalities.  I personally performed the services described in this documentation, which was scribed in my presence. The recorded  information has been reviewed and is accurate.   Bleeding controlled patient we discharged home with followup with your nose and throat.   Mervin Kung, MD 12/25/13 2055  Mervin Kung, MD 12/25/13 [phone]

## 2013-12-25 NOTE — Discharge Instructions (Signed)
Follow up with ear nose and throat Dr. to have the nose packing removed. Keep it in place until then. If they can't see you by Friday then you can come in here Saturday and we can remove the packing. This preferable be followed up with ear nose and throat. Take pain medicine as needed.  if bleeding restarts increase the air in the balloon to increase the pressure the packing. At that does not stop the bleeding return.

## 2013-12-25 NOTE — ED Notes (Signed)
Went in to do rounding and vitals doctor was in doing a procedure will complete when they are done.

## 2013-12-25 NOTE — ED Notes (Signed)
Pt c/o nosebleed and "sharp headache" since early this morning. Pt states "I keep swallowing clots from my nose". Pt reports hx of hypertension.

## 2013-12-26 ENCOUNTER — Encounter (HOSPITAL_COMMUNITY): Payer: Self-pay | Admitting: Emergency Medicine

## 2013-12-26 ENCOUNTER — Emergency Department (HOSPITAL_COMMUNITY)
Admission: EM | Admit: 2013-12-26 | Discharge: 2013-12-27 | Disposition: A | Discharge status: Home / Self Care | Payer: Medicare HMO | Attending: Emergency Medicine | Admitting: Emergency Medicine

## 2013-12-26 DIAGNOSIS — Z880 Allergy status to penicillin: Secondary | ICD-10-CM | POA: Insufficient documentation

## 2013-12-26 DIAGNOSIS — IMO0002 Reserved for concepts with insufficient information to code with codable children: Secondary | ICD-10-CM | POA: Insufficient documentation

## 2013-12-26 DIAGNOSIS — Z8619 Personal history of other infectious and parasitic diseases: Secondary | ICD-10-CM | POA: Insufficient documentation

## 2013-12-26 DIAGNOSIS — F17200 Nicotine dependence, unspecified, uncomplicated: Secondary | ICD-10-CM | POA: Insufficient documentation

## 2013-12-26 DIAGNOSIS — J45909 Unspecified asthma, uncomplicated: Secondary | ICD-10-CM | POA: Insufficient documentation

## 2013-12-26 DIAGNOSIS — F329 Major depressive disorder, single episode, unspecified: Secondary | ICD-10-CM | POA: Insufficient documentation

## 2013-12-26 DIAGNOSIS — Z91040 Latex allergy status: Secondary | ICD-10-CM | POA: Insufficient documentation

## 2013-12-26 DIAGNOSIS — Z7982 Long term (current) use of aspirin: Secondary | ICD-10-CM | POA: Insufficient documentation

## 2013-12-26 DIAGNOSIS — I10 Essential (primary) hypertension: Secondary | ICD-10-CM | POA: Insufficient documentation

## 2013-12-26 DIAGNOSIS — Z791 Long term (current) use of non-steroidal anti-inflammatories (NSAID): Secondary | ICD-10-CM | POA: Insufficient documentation

## 2013-12-26 DIAGNOSIS — F3289 Other specified depressive episodes: Secondary | ICD-10-CM | POA: Insufficient documentation

## 2013-12-26 DIAGNOSIS — R040 Epistaxis: Secondary | ICD-10-CM | POA: Insufficient documentation

## 2013-12-26 DIAGNOSIS — E119 Type 2 diabetes mellitus without complications: Secondary | ICD-10-CM | POA: Insufficient documentation

## 2013-12-26 DIAGNOSIS — Z4800 Encounter for change or removal of nonsurgical wound dressing: Secondary | ICD-10-CM | POA: Insufficient documentation

## 2013-12-26 DIAGNOSIS — R51 Headache: Secondary | ICD-10-CM | POA: Insufficient documentation

## 2013-12-26 DIAGNOSIS — M129 Arthropathy, unspecified: Secondary | ICD-10-CM | POA: Insufficient documentation

## 2013-12-26 DIAGNOSIS — Z79899 Other long term (current) drug therapy: Secondary | ICD-10-CM | POA: Insufficient documentation

## 2013-12-26 NOTE — ED Notes (Signed)
Patient reports was seen in ED yesterday for epistaxis and had rapid rhino placed in left nare. Reports pain to head and nose and requesting that rapid rhino is removed tonight.

## 2013-12-27 MED ORDER — HYDROCODONE-ACETAMINOPHEN 5-325 MG PO TABS
2.0000 | ORAL_TABLET | Freq: Once | ORAL | Status: AC
  Administered 2013-12-27: 2 via ORAL
  Filled 2013-12-27: qty 2

## 2013-12-27 NOTE — ED Provider Notes (Signed)
CSN: [id number]     Arrival date & time 12/26/13  2133 History   First MD Initiated Contact with Patient 12/26/13 2310     Chief Complaint  Patient presents with  . Foreign Body in [place]     (Consider location/radiation/quality/duration/timing/severity/associated sxs/prior Treatment) HPI Comments: Megan Odom is a 68 y.o. female who presents to the Emergency Department requesting removal of a nasal packing that was placed in the left nare yesterday. She complains of increased pain to her left nostril and pain to the left side of her face since the packing was placed.  She also c/o generalized headaches for several days which she reports are intermittent and similar to previous.  She denies continued bleeding, difficulty swallowing or breathing, dizziness or sore throat.  Nothing has made the pain better.    The history is provided by the patient.    Past Medical History  Diagnosis Date  . Hypertension   . Asthma   . Arthritis   . Hep C w/o coma, chronic   . Type 2 diabetes mellitus   . Hyperlipemia   . Depression    Past Surgical History  Procedure Laterality Date  . Cholecystectomy    . Appendectomy    . Abdominal hysterectomy    . Breast surgery     Family History  Problem Relation Age of Onset  . Arthritis    . Cancer    . Asthma    . Diabetes     History  Substance Use Topics  . Smoking status: Current Every Day Smoker -- 0.50 packs/day for 50 years  . Smokeless tobacco: Not on file     Comment: smoking for the past 50 years  . Alcohol Use: No   OB History   Grav Para Term Preterm Abortions TAB SAB Ect Mult Living   4 2 2  2  2   2      Review of Systems  Constitutional: Negative for fever, chills, activity change and appetite change.  HENT: Positive for nosebleeds and sinus pressure. Negative for ear pain, facial swelling, trouble swallowing and voice change.        Pain to the left face and nostril  Eyes: Negative for visual disturbance.  Respiratory:  Negative for chest tightness and shortness of breath.   Cardiovascular: Negative for chest pain.  Gastrointestinal: Negative for nausea, vomiting and abdominal pain.  Musculoskeletal: Negative for back pain, neck pain and neck stiffness.  Skin: Negative for color change, rash and wound.  Neurological: Positive for headaches. Negative for dizziness, syncope, facial asymmetry, speech difficulty, weakness, light-headedness and numbness.  Hematological: Does not bruise/bleed easily.  Psychiatric/Behavioral: The patient is not nervous/anxious.   All other systems reviewed and are negative.     Allergies  Darvocet; Latex; Other; Penicillins; Tetracyclines & related; and Adhesive  Home Medications   Prior to Admission medications   Medication Sig Start Date End Date Taking? Authorizing Provider  albuterol (PROVENTIL HFA;VENTOLIN HFA) 108 (90 BASE) MCG/ACT inhaler Inhale 2 puffs into the lungs every 6 (six) hours as needed. Shortness of breath     Historical Provider, MD  albuterol (PROVENTIL) (2.5 MG/3ML) 0.083% nebulizer solution Take 2.5 mg by nebulization 3 (three) times daily as needed for shortness of breath. For shortness of breath    Historical Provider, MD  aspirin EC 81 MG tablet Take 81 mg by mouth daily.      Historical Provider, MD  beclomethasone (QVAR) 40 MCG/ACT inhaler Inhale 1 puff into the  lungs 2 (two) times daily as needed (shortness of breath).     Historical Provider, MD  benzonatate (TESSALON) 100 MG capsule Take 200 mg by mouth 3 (three) times daily as needed for cough.    Historical Provider, MD  dextromethorphan-guaiFENesin (MUCINEX DM) 30-600 MG per 12 hr tablet Take 1 tablet by mouth every 12 (twelve) hours. 05/23/13   Mervin Kung, MD  ergocalciferol (VITAMIN D2) 50000 UNITS capsule Take 50,000 Units by mouth once a week. sunday    Historical Provider, MD  fluticasone (FLONASE) 50 MCG/ACT nasal spray Place 2 sprays into the nose daily as needed for allergies.      Historical Provider, MD  furosemide (LASIX) 40 MG tablet Take 40 mg by mouth daily.      Historical Provider, MD  HYDROcodone-acetaminophen (NORCO/VICODIN) 5-325 MG per tablet Take 1-2 tablets by mouth every 6 (six) hours as needed for moderate pain. 12/25/13   Mervin Kung, MD  ibuprofen (ADVIL,MOTRIN) 800 MG tablet Take 800 mg by mouth 2 (two) times daily as needed for pain. Pain     Historical Provider, MD  latanoprost (XALATAN) 0.005 % ophthalmic solution Place 1 drop into the right eye at bedtime.    Historical Provider, MD  loratadine (CLARITIN) 10 MG tablet Take 10 mg by mouth daily.     Historical Provider, MD  losartan-hydrochlorothiazide (HYZAAR) 100-25 MG per tablet Take 1 tablet by mouth daily.    Historical Provider, MD  omeprazole (PRILOSEC) 40 MG capsule Take 40 mg by mouth daily.     Historical Provider, MD  oxyCODONE-acetaminophen (PERCOCET/ROXICET) 5-325 MG per tablet Take 1-2 tablets by mouth every 6 (six) hours as needed for severe pain. 12/12/13   Jasper Riling. Pickering, MD  PARoxetine (PAXIL) 20 MG tablet Take 20 mg by mouth 2 (two) times daily.    Historical Provider, MD  potassium chloride (K-DUR) 10 MEQ tablet Take 10 mEq by mouth daily.    Historical Provider, MD  traMADol-acetaminophen (ULTRACET) 37.5-325 MG per tablet Take 1 tablet by mouth 2 (two) times daily. 12/20/13   Historical Provider, MD  verapamil (VERELAN PM) 120 MG 24 hr capsule Take 120 mg by mouth every morning.    Historical Provider, MD   BP 160/77  Pulse 60  Temp(Src) 97.4 F (36.3 C) (Oral)  Resp 18  Ht 5\' 5"  (1.651 m)  Wt 276 lb (125.193 kg)  BMI 45.93 kg/m2  SpO2 98% Physical Exam  Nursing note and vitals reviewed. Constitutional: She is oriented to person, place, and time. She appears well-developed and well-nourished. No distress.  HENT:  Head: Normocephalic and atraumatic.  Nose: No epistaxis. Left sinus exhibits no maxillary sinus tenderness and no frontal sinus tenderness.  Mouth/Throat:  Uvula is midline, oropharynx is clear and moist and mucous membranes are normal. No oropharyngeal exudate, posterior oropharyngeal edema, posterior oropharyngeal erythema or tonsillar abscesses.  Patient has nasal packing placed to the left nare.  No evidence of continued bleeding. Oropharynx is clear and without blood.  Cardiovascular: Normal rate, regular rhythm, normal heart sounds and intact distal pulses.   No murmur heard. Pulmonary/Chest: Effort normal and breath sounds normal. No respiratory distress.  Musculoskeletal: Normal range of motion. She exhibits no edema and no tenderness.  Neurological: She is alert and oriented to person, place, and time. She exhibits normal muscle tone. Coordination normal.  Skin: Skin is warm and dry.    ED Course  Procedures (including critical care time) Labs Review Labs Reviewed - No data  to display  Imaging Review   EKG Interpretation None      Procedure for removal of nasal packing: Nasal packing to the left nare was removed by me by deflating the nasal balloon. Surgical lubricant was applied to the outside nare and the packing was gently removed w/o difficulty.  Small amt of residual bleeding to the oropharynx that quickly resolved.  Patient tolerated procedure well.    MDM   Final diagnoses:  Encounter for removal of nasal packing   Previous ED chart, labs and CT head was reviewed by me.    Nasal packing to the left nostril was removed by me with at request of the patient.    She was observed for approximately one hour after packing removal without further bleeding of the anterior nostril or to back of the throat.  VSS.  No focal neuro deficits.  Patient has tolerated fluids and she is requesting discharge at this time.  I have advised to avoid blowing her nose, sneezing or bending over for 48 hrs.  She also agrees to close f/u with her PMD or ENT.  She is feeling better and appears stable for d/c.      Dian Laprade L. Johnisha Louks,  PA-C 12/27/13 0130

## 2013-12-27 NOTE — ED Provider Notes (Signed)
Medical screening examination/treatment/procedure(s) were performed by non-physician practitioner and as supervising physician I was immediately available for consultation/collaboration.   Delora Fuel, MD [id number]/[id number]

## 2014-03-04 ENCOUNTER — Other Ambulatory Visit: Payer: Self-pay | Admitting: Cardiovascular Disease

## 2014-04-24 ENCOUNTER — Other Ambulatory Visit: Payer: Self-pay | Admitting: Cardiovascular Disease

## 2014-04-24 NOTE — Telephone Encounter (Signed)
Rx was sent to pharmacy electronically. 

## 2014-05-27 ENCOUNTER — Other Ambulatory Visit (HOSPITAL_COMMUNITY): Payer: Self-pay | Admitting: Internal Medicine

## 2014-05-27 DIAGNOSIS — Z1231 Encounter for screening mammogram for malignant neoplasm of breast: Secondary | ICD-10-CM

## 2014-06-04 ENCOUNTER — Ambulatory Visit (HOSPITAL_COMMUNITY): Payer: Medicare HMO

## 2014-06-09 ENCOUNTER — Telehealth: Payer: Self-pay

## 2014-06-09 NOTE — Telephone Encounter (Signed)
Patient received letter from [facility] to be triaged for tcs. Patient goes by Megan Odom (middle name).  She can be reached at [phone]

## 2014-06-10 NOTE — Telephone Encounter (Addendum)
Gastroenterology Pre-Procedure Review  Request Date: 06/10/2014 Requesting Physician: Dr. Legrand Rams   PATIENT REVIEW QUESTIONS: The patient responded to the following health history questions as indicated:    1. Diabetes Melitis: no 2. Joint replacements in the past 12 months: no 3. Major health problems in the past 3 months: no 4. Has an artificial valve or MVP: no 5. Has a defibrillator: no 6. Has been advised in past to take antibiotics in advance of a procedure like teeth cleaning: no    MEDICATIONS & ALLERGIES:    Patient reports the following regarding taking any blood thinners:   Plavix? no Aspirin? YES Coumadin? no  Patient confirms/reports the following medications:  Current Outpatient Prescriptions  Medication Sig Dispense Refill  . albuterol (PROVENTIL HFA;VENTOLIN HFA) 108 (90 BASE) MCG/ACT inhaler Inhale 2 puffs into the lungs every 6 (six) hours as needed. Shortness of breath       . albuterol (PROVENTIL) (2.5 MG/3ML) 0.083% nebulizer solution Take 2.5 mg by nebulization 3 (three) times daily as needed for shortness of breath. For shortness of breath      . aspirin EC 81 MG tablet Take 81 mg by mouth daily.        . beclomethasone (QVAR) 40 MCG/ACT inhaler Inhale 1 puff into the lungs 2 (two) times daily as needed (shortness of breath).       . benzonatate (TESSALON) 100 MG capsule Take 200 mg by mouth 3 (three) times daily as needed for cough.      . dextromethorphan-guaiFENesin (MUCINEX DM) 30-600 MG per 12 hr tablet Take 1 tablet by mouth every 12 (twelve) hours.  14 tablet  1  . ergocalciferol (VITAMIN D2) 50000 UNITS capsule Take 50,000 Units by mouth once a week. sunday      . fluticasone (FLONASE) 50 MCG/ACT nasal spray Place 2 sprays into the nose daily as needed for allergies.       . furosemide (LASIX) 40 MG tablet Take 40 mg by mouth daily.        Marland Kitchen ibuprofen (ADVIL,MOTRIN) 800 MG tablet Take 800 mg by mouth 2 (two) times daily as needed for pain. Pain       .  ibuprofen (ADVIL,MOTRIN) 800 MG tablet Take 800 mg by mouth every 8 (eight) hours as needed.      . latanoprost (XALATAN) 0.005 % ophthalmic solution Place 1 drop into the right eye at bedtime.      . Ledipasvir-Sofosbuvir (HARVONI) 90-400 MG TABS Take by mouth daily.      Marland Kitchen loratadine (CLARITIN) 10 MG tablet Take 10 mg by mouth daily.       Marland Kitchen losartan-hydrochlorothiazide (HYZAAR) 100-25 MG per tablet Take 1 tablet by mouth daily.      . Omega-3 Fatty Acids (OMEGA-3 FISH OIL) 1200 MG CAPS Take 1,200 mg by mouth every morning.      Marland Kitchen omeprazole (PRILOSEC) 40 MG capsule Take 40 mg by mouth daily.       Marland Kitchen PARoxetine (PAXIL) 20 MG tablet Take 20 mg by mouth 2 (two) times daily.      . potassium chloride (K-DUR) 10 MEQ tablet Take 10 mEq by mouth daily.      . pregabalin (LYRICA) 50 MG capsule Take 50 mg by mouth 2 (two) times daily.      . traMADol-acetaminophen (ULTRACET) 37.5-325 MG per tablet Take 1 tablet by mouth 2 (two) times daily.      . verapamil (CALAN-SR) 120 MG CR tablet Take 1 tablet (120 mg  total) by mouth daily. PT NEEDS APPOINTMENT FOR FUTURE REFILLS.  15 tablet  0   No current facility-administered medications for this visit.    Patient confirms/reports the following allergies:  Allergies  Allergen Reactions  . Darvocet [Propoxyphene N-Acetaminophen] Other (See Comments)    Hurts ears and side of face   . Latex Swelling  . Other Other (See Comments)    Hairspray(isoaplus)-causes big soars in head  . Penicillins Other (See Comments)    Hurts ears and side of face   . Tetracyclines & Related Other (See Comments)    Hurts ears and side of face  . Adhesive [Tape] Itching and Rash    Telemetry pads cause itching and rash at site    No orders of the defined types were placed in this encounter.    AUTHORIZATION INFORMATION Primary Insurance:  ID #: Group #:  Pre-Cert / Auth required:  Pre-Cert / Auth #:   Secondary Insurance:   ID #:   Group #:  Pre-Cert / Auth  required Pre-Cert / Auth #:   SCHEDULE INFORMATION: Procedure has been scheduled as follows:  Date:                 Time:   Location:   This Gastroenterology Pre-Precedure Review Form is being routed to the following provider(s): R. Garfield Cornea, MD

## 2014-06-11 ENCOUNTER — Other Ambulatory Visit: Payer: Self-pay | Admitting: Obstetrics and Gynecology

## 2014-06-15 NOTE — Telephone Encounter (Signed)
Doris, let's discuss this one.

## 2014-06-15 NOTE — Addendum Note (Signed)
Addended by: Mahala Menghini on: 06/15/2014 08:08 PM   Modules accepted: Orders

## 2014-06-16 NOTE — Telephone Encounter (Signed)
Megan Odom, I spoke to pt. She sees Dr. Manuela Neptune in [place], [place] [phone]). She said she started Harvoni one time and had to stop it made her so sick. She restarted it and she thinks she is on her last bottle of pills now. Please advise if OV needed.

## 2014-06-16 NOTE — Telephone Encounter (Signed)
OK to triage, schedule.  Ask her is she has sleep apnea. If so, notify endo.

## 2014-06-17 NOTE — Telephone Encounter (Signed)
Pt said she does not have sleep apnea. She was checked for it and told that she was OK.

## 2014-06-17 NOTE — Telephone Encounter (Signed)
She is scheduled for 07/03/2014 at 11:00 Am with Dr. Gala Romney.

## 2014-06-18 NOTE — Telephone Encounter (Signed)
noted 

## 2014-06-19 ENCOUNTER — Other Ambulatory Visit: Payer: Self-pay

## 2014-06-19 DIAGNOSIS — Z1211 Encounter for screening for malignant neoplasm of colon: Secondary | ICD-10-CM

## 2014-06-19 MED ORDER — PEG-KCL-NACL-NASULF-NA ASC-C 100 G PO SOLR: 1.0000 | ORAL | Status: DC

## 2014-06-19 NOTE — Telephone Encounter (Signed)
Rx sent to the pharmacy and instructions mailed to pt.  

## 2014-06-19 NOTE — Addendum Note (Signed)
Addended by: Everardo All on: 06/19/2014 12:23 PM   Modules accepted: Orders

## 2014-06-23 ENCOUNTER — Other Ambulatory Visit: Payer: Self-pay | Admitting: Cardiovascular Disease

## 2014-06-23 ENCOUNTER — Encounter (HOSPITAL_COMMUNITY): Payer: Self-pay | Admitting: Emergency Medicine

## 2014-07-01 ENCOUNTER — Telehealth: Payer: Self-pay

## 2014-07-01 NOTE — Telephone Encounter (Signed)
PT is scheduled for colonoscopy with Dr. Gala Romney on 07/03/2014. She is afraid that she will be awake and feel pain. I explained that they give her some conscious sedation and she will not be aware. She had not received her instructions in the mail and I gave those to her and reviewed the instructions. Pt expressed understanding. Also, she said she could not afford the prep and I gave her a coupon to get it free.

## 2014-07-01 NOTE — Telephone Encounter (Signed)
Triage looks appropriate. Conscious sedation as planned.

## 2014-07-03 ENCOUNTER — Ambulatory Visit (HOSPITAL_COMMUNITY)
Admission: RE | Admit: 2014-07-03 | Discharge: 2014-07-03 | Disposition: A | Discharge status: Home / Self Care | Payer: Medicare HMO | Source: Ambulatory Visit | Attending: Internal Medicine | Admitting: Internal Medicine

## 2014-07-03 ENCOUNTER — Encounter (HOSPITAL_COMMUNITY)
Admission: RE | Disposition: A | Discharge status: Home / Self Care | Payer: Self-pay | Source: Ambulatory Visit | Attending: Internal Medicine

## 2014-07-03 ENCOUNTER — Encounter (HOSPITAL_COMMUNITY): Payer: Self-pay

## 2014-07-03 DIAGNOSIS — I10 Essential (primary) hypertension: Secondary | ICD-10-CM | POA: Diagnosis not present

## 2014-07-03 DIAGNOSIS — Z7982 Long term (current) use of aspirin: Secondary | ICD-10-CM | POA: Insufficient documentation

## 2014-07-03 DIAGNOSIS — E785 Hyperlipidemia, unspecified: Secondary | ICD-10-CM | POA: Diagnosis not present

## 2014-07-03 DIAGNOSIS — F329 Major depressive disorder, single episode, unspecified: Secondary | ICD-10-CM | POA: Diagnosis not present

## 2014-07-03 DIAGNOSIS — D127 Benign neoplasm of rectosigmoid junction: Secondary | ICD-10-CM | POA: Insufficient documentation

## 2014-07-03 DIAGNOSIS — K5730 Diverticulosis of large intestine without perforation or abscess without bleeding: Secondary | ICD-10-CM | POA: Insufficient documentation

## 2014-07-03 DIAGNOSIS — D123 Benign neoplasm of transverse colon: Secondary | ICD-10-CM | POA: Diagnosis not present

## 2014-07-03 DIAGNOSIS — Z79899 Other long term (current) drug therapy: Secondary | ICD-10-CM | POA: Diagnosis not present

## 2014-07-03 DIAGNOSIS — Z1211 Encounter for screening for malignant neoplasm of colon: Secondary | ICD-10-CM | POA: Insufficient documentation

## 2014-07-03 DIAGNOSIS — E119 Type 2 diabetes mellitus without complications: Secondary | ICD-10-CM | POA: Insufficient documentation

## 2014-07-03 DIAGNOSIS — J45909 Unspecified asthma, uncomplicated: Secondary | ICD-10-CM | POA: Insufficient documentation

## 2014-07-03 DIAGNOSIS — Q438 Other specified congenital malformations of intestine: Secondary | ICD-10-CM | POA: Insufficient documentation

## 2014-07-03 HISTORY — PX: COLONOSCOPY: SHX5424

## 2014-07-03 SURGERY — COLONOSCOPY
Anesthesia: Moderate Sedation

## 2014-07-03 MED ORDER — MEPERIDINE HCL 100 MG/ML IJ SOLN
INTRAMUSCULAR | Status: AC
  Filled 2014-07-03: qty 2

## 2014-07-03 MED ORDER — MIDAZOLAM HCL 5 MG/5ML IJ SOLN
INTRAMUSCULAR | Status: DC | PRN
  Administered 2014-07-03: 2 mg via INTRAVENOUS
  Administered 2014-07-03 (×2): 1 mg via INTRAVENOUS

## 2014-07-03 MED ORDER — MIDAZOLAM HCL 5 MG/5ML IJ SOLN
INTRAMUSCULAR | Status: AC
  Filled 2014-07-03: qty 10

## 2014-07-03 MED ORDER — STERILE WATER FOR IRRIGATION IR SOLN
Status: DC | PRN
  Administered 2014-07-03: 12:00:00

## 2014-07-03 MED ORDER — ONDANSETRON HCL 4 MG/2ML IJ SOLN
INTRAMUSCULAR | Status: DC | PRN
  Administered 2014-07-03: 4 mg via INTRAVENOUS

## 2014-07-03 MED ORDER — SODIUM CHLORIDE 0.9 % IV SOLN
INTRAVENOUS | Status: DC
  Administered 2014-07-03: 10:00:00 via INTRAVENOUS

## 2014-07-03 MED ORDER — MEPERIDINE HCL 100 MG/ML IJ SOLN
INTRAMUSCULAR | Status: DC | PRN
  Administered 2014-07-03: 25 mg via INTRAVENOUS
  Administered 2014-07-03: 50 mg via INTRAVENOUS

## 2014-07-03 MED ORDER — ONDANSETRON HCL 4 MG/2ML IJ SOLN
INTRAMUSCULAR | Status: AC
  Filled 2014-07-03: qty 2

## 2014-07-03 NOTE — Op Note (Signed)
[facility] [address] [place], [address]   COLONOSCOPY PROCEDURE REPORT  PATIENT: Megan Odom, Megan Odom  MR#: [id number] BIRTHDATE: 05-May-1946 , 70  yrs. old GENDER: female ENDOSCOPIST: R.  Garfield Cornea, MD FACP [facility] REFERRED OM:BTDHRCBU Legrand Rams, M.D. PROCEDURE DATE:  2014-07-30 PROCEDURE:   Colonoscopy with ablation and Colonoscopy with snare polypectomy INDICATIONS:Colorectal cancer screening. MEDICATIONS: Versed 4 mg IV and Demerol 75 mg IV in divided doses. Zofran 4 mg IV ASA CLASS:       Class III  CONSENT: The risks, benefits, alternatives and imponderables including but not limited to bleeding, perforation as well as the possibility of a missed lesion have been reviewed.  The potential for biopsy, lesion removal, etc. have also been discussed. Questions have been answered.  All parties agreeable.  Please see the history and physical in the medical record for more information.  DESCRIPTION OF PROCEDURE:   After the risks benefits and alternatives of the procedure were thoroughly explained, informed consent was obtained.  The digital rectal exam revealed no rectal mass.   The EC-[id number] ([id number])  endoscope was introduced through the anus and advanced to the cecum, which was identified by both the appendix and ileocecal valve. No adverse events experienced. The quality of the prep was adequate.  The instrument was then slowly withdrawn as the colon was fully examined.      COLON FINDINGS: Normal appearing rectal mucosa.  Redundant colon for a number maneuvers including changing of the patient's position and external abdominal pressure to reach the cecum.  Patient scattered left-sided diverticula.  The patient had (1) 5 millimeter polyp at the hepatic flexure and a 1 cm pedunculated polyp at the rectosigmoid junction.  There was an adjacent diminutive polyp next to the large rectosigmoid polyp; otherwise, the remainder of the colonic mucosa appeared  normal.  The polyp at the hepatic flexure and the larger rectosigmoid polyp were hot snare removed.  The diminutive polyp in the rectosigmoid region was ablated with the tip of the hot snare loop.  Retroflexed views revealed no abnormalities. .  Withdrawal time=9 minutes 0 seconds.  The scope was withdrawn and the procedure completed. COMPLICATIONS: There were no immediate complications.  ENDOSCOPIC IMPRESSION: Colonic diverticulosis. Multiple colonic polyps treated/removed as described above. Redundant colon.  RECOMMENDATIONS: Follow up on pathology.  eSigned:  R. Garfield Cornea, MD Rosalita Chessman [facility] 30-Jul-2014 12:19 PM   cc:  CPT CODES: ICD CODES:  The ICD and CPT codes recommended by this software are interpretations from the data that the clinical staff has captured with the software.  The verification of the translation of this report to the ICD and CPT codes and modifiers is the sole responsibility of the health care institution and practicing physician where this report was generated.  [employer] will not be held responsible for the validity of the ICD and CPT codes included on this report.  AMA assumes no liability for data contained or not contained herein. CPT is a Designer, television/film set of the [employer].  PATIENT NAME:  Megan Odom, Megan Odom MR#: [id number]

## 2014-07-03 NOTE — H&P (Signed)
@LOGO @   Primary Care Physician:  Rosita Fire, MD Primary Gastroenterologist:  Dr. Gala Romney  Pre-Procedure History & Physical: HPI:  Megan Odom is a 68 y.o. female is here for a screening colonoscopy. Last colonoscopy negative -  reportedly 10 years ago. No bowel symptoms. No family history of colon cancer. No GI symptoms.  Past Medical History  Diagnosis Date  . Hypertension   . Asthma   . Arthritis   . Hep C w/o coma, chronic   . Type 2 diabetes mellitus   . Hyperlipemia   . Depression     Past Surgical History  Procedure Laterality Date  . Cholecystectomy    . Appendectomy    . Abdominal hysterectomy    . Breast surgery      Prior to Admission medications   Medication Sig Start Date End Date Taking? Authorizing Provider  albuterol (PROVENTIL HFA;VENTOLIN HFA) 108 (90 BASE) MCG/ACT inhaler Inhale 2 puffs into the lungs every 6 (six) hours as needed. Shortness of breath    Yes Historical Provider, MD  albuterol (PROVENTIL) (2.5 MG/3ML) 0.083% nebulizer solution Take 2.5 mg by nebulization 3 (three) times daily as needed for shortness of breath. For shortness of breath   Yes Historical Provider, MD  aspirin EC 81 MG tablet Take 81 mg by mouth daily.     Yes Historical Provider, MD  beclomethasone (QVAR) 40 MCG/ACT inhaler Inhale 1 puff into the lungs 2 (two) times daily as needed (shortness of breath).    Yes Historical Provider, MD  benzonatate (TESSALON) 100 MG capsule Take 200 mg by mouth 3 (three) times daily as needed for cough.   Yes Historical Provider, MD  dextromethorphan-guaiFENesin (MUCINEX DM) 30-600 MG per 12 hr tablet Take 1 tablet by mouth every 12 (twelve) hours. 05/23/13  Yes Fredia Sorrow, MD  ergocalciferol (VITAMIN D2) 50000 UNITS capsule Take 50,000 Units by mouth once a week. sunday   Yes Historical Provider, MD  fluticasone (FLONASE) 50 MCG/ACT nasal spray Place 2 sprays into the nose daily as needed for allergies.    Yes Historical Provider, MD   furosemide (LASIX) 40 MG tablet Take 40 mg by mouth daily.     Yes Historical Provider, MD  ibuprofen (ADVIL,MOTRIN) 800 MG tablet Take 800 mg by mouth 2 (two) times daily as needed for pain. Pain    Yes Historical Provider, MD  latanoprost (XALATAN) 0.005 % ophthalmic solution Place 1 drop into the right eye at bedtime.   Yes Historical Provider, MD  Ledipasvir-Sofosbuvir (HARVONI) 90-400 MG TABS Take by mouth daily.   Yes Historical Provider, MD  loratadine (CLARITIN) 10 MG tablet Take 10 mg by mouth daily.    Yes Historical Provider, MD  losartan-hydrochlorothiazide (HYZAAR) 100-25 MG per tablet Take 1 tablet by mouth daily.   Yes Historical Provider, MD  Omega-3 Fatty Acids (OMEGA-3 FISH OIL) 1200 MG CAPS Take 1,200 mg by mouth every morning.   Yes Historical Provider, MD  omeprazole (PRILOSEC) 40 MG capsule Take 40 mg by mouth daily.    Yes Historical Provider, MD  PARoxetine (PAXIL) 20 MG tablet Take 20 mg by mouth 2 (two) times daily.   Yes Historical Provider, MD  peg 3350 powder (MOVIPREP) 100 G SOLR Take 1 kit (200 g total) by mouth as directed. 06/19/14  Yes Daneil Dolin, MD  pregabalin (LYRICA) 50 MG capsule Take 50 mg by mouth 2 (two) times daily.   Yes Historical Provider, MD  traMADol-acetaminophen (ULTRACET) 37.5-325 MG per tablet Take 1  tablet by mouth 2 (two) times daily. 12/20/13  Yes Historical Provider, MD  potassium chloride (K-DUR) 10 MEQ tablet Take 10 mEq by mouth daily.    Historical Provider, MD  verapamil (CALAN-SR) 120 MG CR tablet TAKE ONE TABLET BY MOUTH ONCE DAILY. 06/23/14   Sanda Klein, MD    Allergies as of 06/19/2014 - Review Complete 06/10/2014  Allergen Reaction Noted  . Darvocet [propoxyphene n-acetaminophen] Other (See Comments) 02/26/2011  . Latex Swelling 04/24/2012  . Other Other (See Comments) 02/13/2013  . Penicillins Other (See Comments) 02/26/2011  . Tetracyclines & related Other (See Comments) 02/26/2011  . Adhesive [tape] Itching and Rash  12/04/2012    Family History  Problem Relation Age of Onset  . Arthritis    . Cancer    . Asthma    . Diabetes      History   Social History  . Marital Status: Widowed    Spouse Name: N/A    Number of Children: N/A  . Years of Education: GED   Occupational History  .     Social History Main Topics  . Smoking status: Current Every Day Smoker -- 0.50 packs/day for 50 years  . Smokeless tobacco: Not on file     Comment: smoking for the past 50 years  . Alcohol Use: No  . Drug Use: No  . Sexual Activity: Yes    Birth Control/ Protection: Surgical   Other Topics Concern  . Not on file   Social History Narrative    Review of Systems: See HPI, otherwise negative ROS  Physical Exam: BP 181/72 mmHg  Pulse 53  Temp(Src) 97.5 F (36.4 C)  Resp 20  Ht 5' 5.5" (1.664 m)  Wt 264 lb (119.75 kg)  BMI 43.25 kg/m2  SpO2 99% General:   Alert,  Well-developed, well-nourished, pleasant and cooperative in NAD Head:  Normocephalic and atraumatic. Eyes:  Sclera clear, no icterus.   Conjunctiva pink. Ears:  Normal auditory acuity. Nose:  No deformity, discharge,  or lesions. Mouth:  No deformity or lesions, dentition normal. Neck:  Supple; no masses or thyromegaly. Lungs:  Clear throughout to auscultation.   No wheezes, crackles, or rhonchi. No acute distress. Heart:  Regular rate and rhythm; no murmurs, clicks, rubs,  or gallops. Abdomen:  Soft, nontender and nondistended. No masses, hepatosplenomegaly or hernias noted. Normal bowel sounds, without guarding, and without rebound.   Msk:  Symmetrical without gross deformities. Normal posture. Pulses:  Normal pulses noted. Extremities:  Without clubbing or edema. Neurologic:  Alert and  oriented x4;  grossly normal neurologically. Skin:  Intact without significant lesions or rashes. Cervical Nodes:  No significant cervical adenopathy. Psych:  Alert and cooperative. Normal mood and affect.  Impression/Plan: Megan Odom  is now here to undergo a screening colonoscopy.  Average risk screening examination. Risks, benefits, limitations, imponderables and alternatives regarding colonoscopy have been reviewed with the patient. Questions have been answered. All parties agreeable.     Notice:  This dictation was prepared with Dragon dictation along with smaller phrase technology. Any transcriptional errors that result from this process are unintentional and may not be corrected upon review.

## 2014-07-03 NOTE — Discharge Instructions (Addendum)
°Colonoscopy °Discharge Instructions ° °Read the instructions outlined below and refer to this sheet in the next few weeks. These discharge instructions provide you with general information on caring for yourself after you leave the hospital. Your doctor may also give you specific instructions. While your treatment has been planned according to the most current medical practices available, unavoidable complications occasionally occur. If you have any problems or questions after discharge, call Dr. Rourk at [phone]. °ACTIVITY °· You may resume your regular activity, but move at a slower pace for the next 24 hours.  °· Take frequent rest periods for the next 24 hours.  °· Walking will help get rid of the air and reduce the bloated feeling in your belly (abdomen).  °· No driving for 24 hours (because of the medicine (anesthesia) used during the test).   °· Do not sign any important legal documents or operate any machinery for 24 hours (because of the anesthesia used during the test).  °NUTRITION °· Drink plenty of fluids.  °· You may resume your normal diet as instructed by your doctor.  °· Begin with a light meal and progress to your normal diet. Heavy or fried foods are harder to digest and may make you feel sick to your stomach (nauseated).  °· Avoid alcoholic beverages for 24 hours or as instructed.  °MEDICATIONS °· You may resume your normal medications unless your doctor tells you otherwise.  °WHAT YOU CAN EXPECT TODAY °· Some feelings of bloating in the abdomen.  °· Passage of more gas than usual.  °· Spotting of blood in your stool or on the toilet paper.  °IF YOU HAD POLYPS REMOVED DURING THE COLONOSCOPY: °· No aspirin products for 7 days or as instructed.  °· No alcohol for 7 days or as instructed.  °· Eat a soft diet for the next 24 hours.  °FINDING OUT THE RESULTS OF YOUR TEST °Not all test results are available during your visit. If your test results are not back during the visit, make an appointment  with your caregiver to find out the results. Do not assume everything is normal if you have not heard from your caregiver or the medical facility. It is important for you to follow up on all of your test results.  °SEEK IMMEDIATE MEDICAL ATTENTION IF: °· You have more than a spotting of blood in your stool.  °· Your belly is swollen (abdominal distention).  °· You are nauseated or vomiting.  °· You have a temperature over 101.  °· You have abdominal pain or discomfort that is severe or gets worse throughout the day.  ° °Polyp and diverticulosis information provided ° °Further recommendations to follow pending review of pathology report ° °Colon Polyps °Polyps are lumps of extra tissue growing inside the body. Polyps can grow in the large intestine (colon). Most colon polyps are noncancerous (benign). However, some colon polyps can become cancerous over time. Polyps that are larger than a pea may be harmful. To be safe, caregivers remove and test all polyps. °CAUSES  °Polyps form when mutations in the genes cause your cells to grow and divide even though no more tissue is needed. °RISK FACTORS °There are a number of risk factors that can increase your chances of getting colon polyps. They include: °· Being older than 50 years. °· Family history of colon polyps or colon cancer. °· Long-term colon diseases, such as colitis or Crohn disease. °· Being overweight. °· Smoking. °· Being inactive. °· Drinking too much alcohol. °  SYMPTOMS  °Most small polyps do not cause symptoms. If symptoms are present, they may include: °· Blood in the stool. The stool may look dark red or black. °· Constipation or diarrhea that lasts longer than 1 week. °DIAGNOSIS °People often do not know they have polyps until their caregiver finds them during a regular checkup. Your caregiver can use 4 tests to check for polyps: °· Digital rectal exam. The caregiver wears gloves and feels inside the rectum. This test would find polyps only in the  rectum. °· Barium enema. The caregiver puts a liquid called barium into your rectum before taking X-rays of your colon. Barium makes your colon look white. Polyps are dark, so they are easy to see in the X-ray pictures. °· Sigmoidoscopy. A thin, flexible tube (sigmoidoscope) is placed into your rectum. The sigmoidoscope has a light and tiny camera in it. The caregiver uses the sigmoidoscope to look at the last third of your colon. °· Colonoscopy. This test is like sigmoidoscopy, but the caregiver looks at the entire colon. This is the most common method for finding and removing polyps. °TREATMENT  °Any polyps will be removed during a sigmoidoscopy or colonoscopy. The polyps are then tested for cancer. °PREVENTION  °To help lower your risk of getting more colon polyps: °· Eat plenty of fruits and vegetables. Avoid eating fatty foods. °· Do not smoke. °· Avoid drinking alcohol. °· Exercise every day. °· Lose weight if recommended by your caregiver. °· Eat plenty of calcium and folate. Foods that are rich in calcium include milk, cheese, and broccoli. Foods that are rich in folate include chickpeas, kidney beans, and spinach. °HOME CARE INSTRUCTIONS °Keep all follow-up appointments as directed by your caregiver. You may need periodic exams to check for polyps. °SEEK MEDICAL CARE IF: °You notice bleeding during a bowel movement. °Document Released: 05/04/2004 Document Revised: 10/31/2011 Document Reviewed: 10/18/2011 °ExitCare® Patient Information ©2015 [employer]. This information is not intended to replace advice given to you by your health care provider. Make sure you discuss any questions you have with your health care provider. ° °Diverticulosis °Diverticulosis is the condition that develops when small pouches (diverticula) form in the wall of your colon. Your colon, or large intestine, is where water is absorbed and stool is formed. The pouches form when the inside layer of your colon pushes through weak spots  in the outer layers of your colon. °CAUSES  °No one knows exactly what causes diverticulosis. °RISK FACTORS °· Being older than 50. Your risk for this condition increases with age. Diverticulosis is rare in people younger than 40 years. By age 80, almost everyone has it. °· Eating a low-fiber diet. °· Being frequently constipated. °· Being overweight. °· Not getting enough exercise. °· Smoking. °· Taking over-the-counter pain medicines, like aspirin and ibuprofen. °SYMPTOMS  °Most people with diverticulosis do not have symptoms. °DIAGNOSIS  °Because diverticulosis often has no symptoms, health care providers often discover the condition during an exam for other colon problems. In many cases, a health care provider will diagnose diverticulosis while using a flexible scope to examine the colon (colonoscopy). °TREATMENT  °If you have never developed an infection related to diverticulosis, you may not need treatment. If you have had an infection before, treatment may include: °· Eating more fruits, vegetables, and grains. °· Taking a fiber supplement. °· Taking a live bacteria supplement (probiotic). °· Taking medicine to relax your colon. °HOME CARE INSTRUCTIONS  °· Drink at least 6-8 glasses of water each day   to prevent constipation. °· Try not to strain when you have a bowel movement. °· Keep all follow-up appointments. °If you have had an infection before:  °· Increase the fiber in your diet as directed by your health care provider or dietitian. °· Take a dietary fiber supplement if your health care provider approves. °· Only take medicines as directed by your health care provider. °SEEK MEDICAL CARE IF:  °· You have abdominal pain. °· You have bloating. °· You have cramps. °· You have not gone to the bathroom in 3 days. °SEEK IMMEDIATE MEDICAL CARE IF:  °· Your pain gets worse. °· Your bloating becomes very bad. °· You have a fever or chills, and your symptoms suddenly get worse. °· You begin vomiting. °· You have  bowel movements that are bloody or black. °MAKE SURE YOU: °· Understand these instructions. °· Will watch your condition. °· Will get help right away if you are not doing well or get worse. °Document Released: 05/05/2004 Document Revised: 08/13/2013 Document Reviewed: 07/03/2013 °ExitCare® Patient Information ©2015 [employer]. This information is not intended to replace advice given to you by your health care provider. Make sure you discuss any questions you have with your health care provider. ° ° °

## 2014-07-07 ENCOUNTER — Encounter (HOSPITAL_COMMUNITY): Payer: Self-pay | Admitting: Internal Medicine

## 2014-07-07 ENCOUNTER — Encounter: Payer: Self-pay | Admitting: Internal Medicine

## 2014-07-08 ENCOUNTER — Ambulatory Visit (INDEPENDENT_AMBULATORY_CARE_PROVIDER_SITE_OTHER): Payer: Commercial Managed Care - HMO | Admitting: Obstetrics and Gynecology

## 2014-07-08 ENCOUNTER — Encounter: Payer: Self-pay | Admitting: Obstetrics and Gynecology

## 2014-07-08 VITALS — BP 140/72 | Ht 65.5 in | Wt 268.0 lb

## 2014-07-08 DIAGNOSIS — Z0000 Encounter for general adult medical examination without abnormal findings: Secondary | ICD-10-CM

## 2014-07-08 DIAGNOSIS — Z8542 Personal history of malignant neoplasm of other parts of uterus: Secondary | ICD-10-CM | POA: Diagnosis not present

## 2014-07-08 NOTE — Progress Notes (Signed)
Patient ID: Megan Odom, female   DOB: 11-05-1945, 68 y.o.   MRN: [id number] Pt here today for annual exam and physical. Pt states that she has had a partial hysterectomy due to pre cancerous cells.  Assessment:  Annual Gyn Exam   Plan:  1. pap smear not done,due to hyst. 2. return annually or prn 3    Annual mammogram advised Subjective:  Megan Odom is a 68 y.o. female [phone] who presents for annual exam. No LMP recorded. Patient has had a hysterectomy. The patient has complaints today of none  The following portions of the patient's history were reviewed and updated as appropriate: allergies, current medications, past family history, past medical history, past social history, past surgical history and problem list. Past Medical History  Diagnosis Date  . Hypertension   . Asthma   . Arthritis   . Hep C w/o coma, chronic   . Type 2 diabetes mellitus   . Hyperlipemia   . Depression     Past Surgical History  Procedure Laterality Date  . Cholecystectomy    . Appendectomy    . Abdominal hysterectomy    . Breast surgery    . Colonoscopy N/A 07/03/2014    Procedure: COLONOSCOPY;  Surgeon: Daneil Dolin, MD;  Location: [facility];  Service: Endoscopy;  Laterality: N/A;  11:00 AM - moved to 11:15 - Ginger to notify pt    Current outpatient prescriptions: albuterol (PROVENTIL HFA;VENTOLIN HFA) 108 (90 BASE) MCG/ACT inhaler, Inhale 2 puffs into the lungs every 6 (six) hours as needed. Shortness of breath , Disp: , Rfl: ;  albuterol (PROVENTIL) (2.5 MG/3ML) 0.083% nebulizer solution, Take 2.5 mg by nebulization 3 (three) times daily as needed for shortness of breath. For shortness of breath, Disp: , Rfl:  aspirin EC 81 MG tablet, Take 81 mg by mouth daily.  , Disp: , Rfl: ;  beclomethasone (QVAR) 40 MCG/ACT inhaler, Inhale 1 puff into the lungs 2 (two) times daily as needed (shortness of breath). , Disp: , Rfl: ;  ergocalciferol (VITAMIN D2) 50000 UNITS capsule, Take  50,000 Units by mouth once a week. sunday, Disp: , Rfl: ;  fluticasone (FLONASE) 50 MCG/ACT nasal spray, Place 2 sprays into the nose daily as needed for allergies. , Disp: , Rfl:  furosemide (LASIX) 40 MG tablet, Take 40 mg by mouth daily.  , Disp: , Rfl: ;  ibuprofen (ADVIL,MOTRIN) 800 MG tablet, Take 800 mg by mouth 2 (two) times daily as needed for pain. Pain , Disp: , Rfl: ;  latanoprost (XALATAN) 0.005 % ophthalmic solution, Place 1 drop into the right eye at bedtime., Disp: , Rfl: ;  Ledipasvir-Sofosbuvir (HARVONI) 90-400 MG TABS, Take by mouth daily., Disp: , Rfl:  loratadine (CLARITIN) 10 MG tablet, Take 10 mg by mouth daily. , Disp: , Rfl: ;  losartan-hydrochlorothiazide (HYZAAR) 100-25 MG per tablet, Take 1 tablet by mouth daily., Disp: , Rfl: ;  Omega-3 Fatty Acids (OMEGA-3 FISH OIL) 1200 MG CAPS, Take 1,200 mg by mouth every morning., Disp: , Rfl: ;  omeprazole (PRILOSEC) 40 MG capsule, Take 40 mg by mouth daily. , Disp: , Rfl:  PARoxetine (PAXIL) 20 MG tablet, Take 20 mg by mouth 2 (two) times daily., Disp: , Rfl: ;  potassium chloride (K-DUR) 10 MEQ tablet, Take 10 mEq by mouth daily., Disp: , Rfl: ;  pregabalin (LYRICA) 50 MG capsule, Take 50 mg by mouth 2 (two) times daily., Disp: , Rfl: ;  traMADol-acetaminophen (ULTRACET) 37.5-325  MG per tablet, Take 1 tablet by mouth 2 (two) times daily., Disp: , Rfl:  verapamil (CALAN-SR) 120 MG CR tablet, TAKE ONE TABLET BY MOUTH ONCE DAILY., Disp: 15 tablet, Rfl: 0;  benzonatate (TESSALON) 100 MG capsule, Take 200 mg by mouth 3 (three) times daily as needed for cough., Disp: , Rfl: ;  dextromethorphan-guaiFENesin (MUCINEX DM) 30-600 MG per 12 hr tablet, Take 1 tablet by mouth every 12 (twelve) hours., Disp: 14 tablet, Rfl: 1  Review of Systems Constitutional: negative Gastrointestinal: negative Genitourinary: negative.  Objective:  BP 140/72 mmHg  Ht 5' 5.5" (1.664 m)  Wt 268 lb (121.564 kg)  BMI 43.90 kg/m2   BMI: Body mass index is 43.9  kg/(m^2).  General Appearance: Alert, appropriate appearance for age. No acute distress HEENT: Grossly normal Neck / Thyroid:  Cardiovascular: RRR; normal S1, S2, no murmur Lungs: CTA bilaterally Back: No CVAT Breast Exam: No dimpling, nipple retraction or discharge. No masses or nodes., Normal to inspection, mobile tissue and No masses or nodes.No dimpling, nipple retraction or discharge. Gastrointestinal: Soft, non-tender, no masses or organomegaly Pelvic Exam: External genitalia: normal general appearance Vaginal: normal mucosa without prolapse or lesions Cervix: absent Adnexa: normal bimanual exam Uterus: removed surgically Rectovaginal: not indicated and normal rectal, no masses Lymphatic Exam: Non-palpable nodes in neck, clavicular, axillary, or inguinal regions Skin: no rash or abnormalities Neurologic: Normal gait and speech, no tremor  Psychiatric: Alert and oriented, appropriate affect.  Urinalysis:Not done  Mallory Shirk. MD Pgr [phone] 3:09 PM

## 2014-07-15 ENCOUNTER — Telehealth: Payer: Self-pay

## 2014-07-15 NOTE — Telephone Encounter (Signed)
I agree with advice given; she is okay to wait 3 years.

## 2014-07-15 NOTE — Telephone Encounter (Signed)
Pt called with questions about her letter from [place] in reference to her precancerous polyps.  I reviewed it with her. She was just concerned about waiting 3 years to do next colonoscopy. I told her that is the routine and she will call if she has questions or concerns prior to that.

## 2014-07-23 ENCOUNTER — Other Ambulatory Visit: Payer: Self-pay | Admitting: Cardiovascular Disease

## 2014-07-24 NOTE — Telephone Encounter (Signed)
Rx was sent to pharmacy electronically. 

## 2014-08-01 ENCOUNTER — Encounter: Payer: Self-pay | Admitting: Internal Medicine

## 2014-09-01 ENCOUNTER — Encounter: Payer: Self-pay | Admitting: Internal Medicine

## 2014-09-03 ENCOUNTER — Telehealth: Payer: Self-pay | Admitting: Nurse Practitioner

## 2014-09-03 ENCOUNTER — Encounter: Payer: Self-pay | Admitting: Nurse Practitioner

## 2014-09-03 ENCOUNTER — Ambulatory Visit: Payer: Medicare HMO | Admitting: Nurse Practitioner

## 2014-09-03 NOTE — Telephone Encounter (Signed)
PATIENT WAS A NO SHOW 09/03/14 AND LETTER WAS SENT

## 2014-09-03 NOTE — Telephone Encounter (Signed)
Noted  

## 2014-09-29 ENCOUNTER — Encounter (HOSPITAL_COMMUNITY): Payer: Self-pay | Admitting: Emergency Medicine

## 2014-09-29 ENCOUNTER — Emergency Department (HOSPITAL_COMMUNITY)
Admission: EM | Admit: 2014-09-29 | Discharge: 2014-09-29 | Disposition: A | Discharge status: Home / Self Care | Payer: Commercial Managed Care - HMO | Attending: Emergency Medicine | Admitting: Emergency Medicine

## 2014-09-29 ENCOUNTER — Emergency Department (HOSPITAL_COMMUNITY): Payer: Commercial Managed Care - HMO

## 2014-09-29 DIAGNOSIS — F329 Major depressive disorder, single episode, unspecified: Secondary | ICD-10-CM | POA: Insufficient documentation

## 2014-09-29 DIAGNOSIS — H538 Other visual disturbances: Secondary | ICD-10-CM | POA: Diagnosis not present

## 2014-09-29 DIAGNOSIS — Z79899 Other long term (current) drug therapy: Secondary | ICD-10-CM | POA: Diagnosis not present

## 2014-09-29 DIAGNOSIS — R6889 Other general symptoms and signs: Secondary | ICD-10-CM

## 2014-09-29 DIAGNOSIS — Z720 Tobacco use: Secondary | ICD-10-CM | POA: Diagnosis not present

## 2014-09-29 DIAGNOSIS — J111 Influenza due to unidentified influenza virus with other respiratory manifestations: Secondary | ICD-10-CM | POA: Diagnosis not present

## 2014-09-29 DIAGNOSIS — Z91040 Latex allergy status: Secondary | ICD-10-CM | POA: Diagnosis not present

## 2014-09-29 DIAGNOSIS — J069 Acute upper respiratory infection, unspecified: Secondary | ICD-10-CM | POA: Diagnosis not present

## 2014-09-29 DIAGNOSIS — Z7952 Long term (current) use of systemic steroids: Secondary | ICD-10-CM | POA: Insufficient documentation

## 2014-09-29 DIAGNOSIS — Z880 Allergy status to penicillin: Secondary | ICD-10-CM | POA: Insufficient documentation

## 2014-09-29 DIAGNOSIS — M1990 Unspecified osteoarthritis, unspecified site: Secondary | ICD-10-CM | POA: Insufficient documentation

## 2014-09-29 DIAGNOSIS — M549 Dorsalgia, unspecified: Secondary | ICD-10-CM | POA: Diagnosis not present

## 2014-09-29 DIAGNOSIS — J029 Acute pharyngitis, unspecified: Secondary | ICD-10-CM | POA: Diagnosis present

## 2014-09-29 DIAGNOSIS — E119 Type 2 diabetes mellitus without complications: Secondary | ICD-10-CM | POA: Diagnosis not present

## 2014-09-29 DIAGNOSIS — I10 Essential (primary) hypertension: Secondary | ICD-10-CM | POA: Insufficient documentation

## 2014-09-29 DIAGNOSIS — Z7982 Long term (current) use of aspirin: Secondary | ICD-10-CM | POA: Diagnosis not present

## 2014-09-29 DIAGNOSIS — J45901 Unspecified asthma with (acute) exacerbation: Secondary | ICD-10-CM | POA: Diagnosis not present

## 2014-09-29 DIAGNOSIS — R05 Cough: Secondary | ICD-10-CM | POA: Diagnosis not present

## 2014-09-29 DIAGNOSIS — R079 Chest pain, unspecified: Secondary | ICD-10-CM | POA: Insufficient documentation

## 2014-09-29 LAB — CBC WITH DIFFERENTIAL/PLATELET
Basophils Absolute: 0 10*3/uL (ref 0.0–0.1)
Basophils Relative: 0 % (ref 0–1)
Eosinophils Absolute: 0.2 10*3/uL (ref 0.0–0.7)
Eosinophils Relative: 1 % (ref 0–5)
HCT: 36.8 % (ref 36.0–46.0)
Hemoglobin: 11.8 g/dL — ABNORMAL LOW (ref 12.0–15.0)
Lymphocytes Relative: 20 % (ref 12–46)
Lymphs Abs: 2.9 10*3/uL (ref 0.7–4.0)
MCH: 29.4 pg (ref 26.0–34.0)
MCHC: 32.1 g/dL (ref 30.0–36.0)
MCV: 91.5 fL (ref 78.0–100.0)
Monocytes Absolute: 1 10*3/uL (ref 0.1–1.0)
Monocytes Relative: 7 % (ref 3–12)
Neutro Abs: 10.4 10*3/uL — ABNORMAL HIGH (ref 1.7–7.7)
Neutrophils Relative %: 72 % (ref 43–77)
Platelets: 254 10*3/uL (ref 150–400)
RBC: 4.02 MIL/uL (ref 3.87–5.11)
RDW: 12.7 % (ref 11.5–15.5)
WBC: 14.5 10*3/uL — ABNORMAL HIGH (ref 4.0–10.5)

## 2014-09-29 LAB — BASIC METABOLIC PANEL
Anion gap: 4 — ABNORMAL LOW (ref 5–15)
BUN: 19 mg/dL (ref 6–23)
CO2: 25 mmol/L (ref 19–32)
Calcium: 8.6 mg/dL (ref 8.4–10.5)
Chloride: 106 mmol/L (ref 96–112)
Creatinine, Ser: 0.84 mg/dL (ref 0.50–1.10)
GFR calc Af Amer: 82 mL/min — ABNORMAL LOW (ref >90)
GFR calc non Af Amer: 71 mL/min — ABNORMAL LOW (ref >90)
Glucose, Bld: 106 mg/dL — ABNORMAL HIGH (ref 70–99)
Potassium: 3.3 mmol/L — ABNORMAL LOW (ref 3.5–5.1)
Sodium: 135 mmol/L (ref 135–145)

## 2014-09-29 LAB — RAPID STREP SCREEN (MED CTR MEBANE ONLY): Streptococcus, Group A Screen (Direct): NEGATIVE

## 2014-09-29 LAB — TROPONIN I: Troponin I: <0.03 ng/mL (ref <0.031)

## 2014-09-29 MED ORDER — HYDROCODONE-ACETAMINOPHEN 5-325 MG PO TABS
1.0000 | ORAL_TABLET | Freq: Once | ORAL | Status: AC
  Administered 2014-09-29: 1 via ORAL
  Filled 2014-09-29: qty 1

## 2014-09-29 MED ORDER — HYDROCODONE-ACETAMINOPHEN 5-325 MG PO TABS: 1.0000 | ORAL_TABLET | Freq: Four times a day (QID) | ORAL | Status: DC | PRN

## 2014-09-29 MED ORDER — DM-GUAIFENESIN ER 30-600 MG PO TB12: 1.0000 | ORAL_TABLET | Freq: Two times a day (BID) | ORAL | Status: DC

## 2014-09-29 NOTE — Discharge Instructions (Signed)
Symptoms consistent with flulike illness. Take the hydrocodone as needed for sore throat pain bodyaches and headache. Not taking Mucinex DM to help clear the phlegm and suppress the cough. They can point to follow-up with your regular doctor. Return for any new or worse symptoms.

## 2014-09-29 NOTE — ED Provider Notes (Signed)
CSN: [id number]     Arrival date & time 09/29/14  0650 History  This chart was scribed for Fredia Sorrow, MD by Stephania Fragmin, ED Scribe. This patient was seen in room [facility] and the patient's care was started at 7:40 AM.    Chief Complaint  Patient presents with  . Sore Throat   Patient is a 69 y.o. female presenting with pharyngitis. The history is provided by the patient. No language interpreter was used.  Sore Throat This is a new problem. The current episode started more than 2 days ago. The problem occurs constantly. The problem has been gradually worsening. Associated symptoms include chest pain, headaches and shortness of breath. Pertinent negatives include no abdominal pain. Nothing aggravates the symptoms. Nothing relieves the symptoms. Treatments tried: OTC cold and cough medication. The treatment provided no relief.    HPI Comments: BERTHA LOKKEN is a 69 y.o. female who presents to the Emergency Department complaining of sore throat that began 3 days ago. She complains of associated cough that is sometimes productive, congestion, subjective fever and chills, blurred vision, intermittent chest pain underneath her left breast that began 1 week ago, SOB, myalgias, back pain, and headache. She has taken OTC cold and cough medication with no relief. She denies nausea, vomiting, diarrhea, abdominal pain, dysuria,  leg swelling, rash, or bleeding easily. PCP Dr. Legrand Rams     Past Medical History  Diagnosis Date  . Hypertension   . Asthma   . Arthritis   . Hep C w/o coma, chronic   . Type 2 diabetes mellitus   . Hyperlipemia   . Depression    Past Surgical History  Procedure Laterality Date  . Cholecystectomy    . Appendectomy    . Abdominal hysterectomy    . Breast surgery    . Colonoscopy N/A 07/03/2014    treated/removed as above   Family History  Problem Relation Age of Onset  . Alcohol abuse Mother   . Early death Mother   . Early death Father   . Alcohol abuse  Sister   . HIV Brother   . Diabetes Brother   . Heart disease Brother   . Hepatitis C Daughter   . Hypertension Son   . Cancer Maternal Aunt   . Hypertension Maternal Aunt   . Heart disease Maternal Aunt   . Cancer Maternal Uncle   . Hypertension Maternal Uncle   . Heart disease Brother   . Diabetes Brother    History  Substance Use Topics  . Smoking status: Current Every Day Smoker -- 0.50 packs/day for 50 years  . Smokeless tobacco: Never Used     Comment: smoking for the past 50 years  . Alcohol Use: No   OB History    Gravida Para Term Preterm AB TAB SAB Ectopic Multiple Living   4 2 2  2  2   2      Review of Systems  Constitutional: Positive for fever and chills.  HENT: Positive for congestion and sore throat.   Eyes: Positive for visual disturbance.  Respiratory: Positive for cough and shortness of breath.   Cardiovascular: Positive for chest pain. Negative for leg swelling.  Gastrointestinal: Negative for nausea, vomiting, abdominal pain and diarrhea.  Genitourinary: Negative for dysuria.  Musculoskeletal: Positive for myalgias and back pain.  Skin: Negative for rash.  Neurological: Positive for headaches.  Hematological: Does not bruise/bleed easily.      Allergies  Darvocet; Latex; Other; Penicillins; Tetracyclines & related; and  Adhesive  Home Medications   Prior to Admission medications   Medication Sig Start Date End Date Taking? Authorizing Provider  albuterol (PROVENTIL HFA;VENTOLIN HFA) 108 (90 BASE) MCG/ACT inhaler Inhale 2 puffs into the lungs every 6 (six) hours as needed. Shortness of breath    Yes Historical Provider, MD  albuterol (PROVENTIL) (2.5 MG/3ML) 0.083% nebulizer solution Take 2.5 mg by nebulization 3 (three) times daily as needed for shortness of breath. For shortness of breath   Yes Historical Provider, MD  aspirin EC 81 MG tablet Take 81 mg by mouth daily.     Yes Historical Provider, MD  beclomethasone (QVAR) 40 MCG/ACT inhaler  Inhale 1 puff into the lungs 2 (two) times daily as needed (shortness of breath).    Yes Historical Provider, MD  benzonatate (TESSALON) 100 MG capsule Take 200 mg by mouth 3 (three) times daily as needed for cough.   Yes Historical Provider, MD  ergocalciferol (VITAMIN D2) 50000 UNITS capsule Take 50,000 Units by mouth once a week. sunday   Yes Historical Provider, MD  fluticasone (FLONASE) 50 MCG/ACT nasal spray Place 2 sprays into the nose daily as needed for allergies.    Yes Historical Provider, MD  furosemide (LASIX) 40 MG tablet Take 40 mg by mouth daily.     Yes Historical Provider, MD  ibuprofen (ADVIL,MOTRIN) 800 MG tablet Take 800 mg by mouth 2 (two) times daily as needed for pain. Pain    Yes Historical Provider, MD  latanoprost (XALATAN) 0.005 % ophthalmic solution Place 1 drop into the right eye at bedtime.   Yes Historical Provider, MD  Ledipasvir-Sofosbuvir (HARVONI) 90-400 MG TABS Take 1 tablet by mouth daily.    Yes Historical Provider, MD  loratadine (CLARITIN) 10 MG tablet Take 10 mg by mouth daily.    Yes Historical Provider, MD  losartan-hydrochlorothiazide (HYZAAR) 100-25 MG per tablet Take 1 tablet by mouth daily.   Yes Historical Provider, MD  Omega-3 Fatty Acids (OMEGA-3 FISH OIL) 1200 MG CAPS Take 1,200 mg by mouth every morning.   Yes Historical Provider, MD  omeprazole (PRILOSEC) 40 MG capsule Take 40 mg by mouth daily.    Yes Historical Provider, MD  PARoxetine (PAXIL) 20 MG tablet Take 20 mg by mouth 2 (two) times daily.   Yes Historical Provider, MD  potassium chloride (K-DUR) 10 MEQ tablet Take 10 mEq by mouth daily.   Yes Historical Provider, MD  traMADol-acetaminophen (ULTRACET) 37.5-325 MG per tablet Take 1 tablet by mouth 2 (two) times daily as needed for moderate pain.  12/20/13  Yes Historical Provider, MD  verapamil (CALAN-SR) 120 MG CR tablet Take 1 tablet (120 mg total) by mouth daily. NEED APPOINTMENT FOR FUTURE REFILLS. 07/24/14  Yes Mihai Croitoru, MD   dextromethorphan-guaiFENesin (MUCINEX DM) 30-600 MG per 12 hr tablet Take 1 tablet by mouth every 12 (twelve) hours. Patient not taking: Reported on 09/29/2014 05/23/13   Fredia Sorrow, MD  dextromethorphan-guaiFENesin [facility] DM) 30-600 MG per 12 hr tablet Take 1 tablet by mouth 2 (two) times daily. 09/29/14   Fredia Sorrow, MD  HYDROcodone-acetaminophen (NORCO/VICODIN) 5-325 MG per tablet Take 1-2 tablets by mouth every 6 (six) hours as needed. 09/29/14   Fredia Sorrow, MD  pregabalin (LYRICA) 50 MG capsule Take 50 mg by mouth 2 (two) times daily.    Historical Provider, MD   BP 189/93 mmHg  Pulse 63  Temp(Src) 98.3 F (36.8 C)  Resp 18  Ht 5' 5.5" (1.664 m)  Wt 262 lb [phone]  kg)  BMI 42.92 kg/m2  SpO2 91% Physical Exam  Constitutional: She is oriented to person, place, and time. She appears well-developed and well-nourished. No distress.  HENT:  Head: Normocephalic and atraumatic.  Thick mucus, but no exudate. Sclera is clear. Mucus membranes look dry.  Eyes: Conjunctivae and EOM are normal.  Neck: Neck supple. No tracheal deviation present.  Cardiovascular: Normal rate and regular rhythm.   No murmur heard. Pulmonary/Chest: Effort normal and breath sounds normal. No respiratory distress. She has no wheezes. She has no rales.  Abdominal: Soft. Bowel sounds are normal. There is no tenderness.  Musculoskeletal: Normal range of motion. She exhibits edema (Trace swelling in bilateral lower extremities).  Neurological: She is alert and oriented to person, place, and time.  Skin: Skin is warm and dry.  Psychiatric: She has a normal mood and affect. Her behavior is normal.  Nursing note and vitals reviewed.   ED Course  Procedures (including critical care time)  DIAGNOSTIC STUDIES: Oxygen Saturation is 98% on room air, normal by my interpretation.    COORDINATION OF CARE: 7:45 AM - Discussed treatment plan with pt at bedside which includes EKG, CXR, and throat culture, and pt  agreed to plan.  7:46 AM O2 Sat while in the room is at 97%. Vitals reviewed, and temperature is noted to be normal.   Labs Review Labs Reviewed  BASIC METABOLIC PANEL - Abnormal; Notable for the following:    Potassium 3.3 (*)    Glucose, Bld 106 (*)    GFR calc non Af Amer 71 (*)    GFR calc Af Amer 82 (*)    Anion gap 4 (*)    All other components within normal limits  CBC WITH DIFFERENTIAL/PLATELET - Abnormal; Notable for the following:    WBC 14.5 (*)    Hemoglobin 11.8 (*)    Neutro Abs 10.4 (*)    All other components within normal limits  RAPID STREP SCREEN  CULTURE, GROUP A STREP  TROPONIN I   Results for orders placed or performed during the hospital encounter of 09/29/14  Rapid strep screen  Result Value Ref Range   Streptococcus, Group A Screen (Direct) NEGATIVE NEGATIVE  Basic metabolic panel  Result Value Ref Range   Sodium 135 135 - 145 mmol/L   Potassium 3.3 (L) 3.5 - 5.1 mmol/L   Chloride 106 96 - 112 mmol/L   CO2 25 19 - 32 mmol/L   Glucose, Bld 106 (H) 70 - 99 mg/dL   BUN 19 6 - 23 mg/dL   Creatinine, Ser 0.84 0.50 - 1.10 mg/dL   Calcium 8.6 8.4 - 10.5 mg/dL   GFR calc non Af Amer 71 (L) >90 mL/min   GFR calc Af Amer 82 (L) >90 mL/min   Anion gap 4 (L) 5 - 15  CBC with Differential/Platelet  Result Value Ref Range   WBC 14.5 (H) 4.0 - 10.5 K/uL   RBC 4.02 3.87 - 5.11 MIL/uL   Hemoglobin 11.8 (L) 12.0 - 15.0 g/dL   HCT 36.8 36.0 - 46.0 %   MCV 91.5 78.0 - 100.0 fL   MCH 29.4 26.0 - 34.0 pg   MCHC 32.1 30.0 - 36.0 g/dL   RDW 12.7 11.5 - 15.5 %   Platelets 254 150 - 400 K/uL   Neutrophils Relative % 72 43 - 77 %   Neutro Abs 10.4 (H) 1.7 - 7.7 K/uL   Lymphocytes Relative 20 12 - 46 %   Lymphs Abs 2.9  0.7 - 4.0 K/uL   Monocytes Relative 7 3 - 12 %   Monocytes Absolute 1.0 0.1 - 1.0 K/uL   Eosinophils Relative 1 0 - 5 %   Eosinophils Absolute 0.2 0.0 - 0.7 K/uL   Basophils Relative 0 0 - 1 %   Basophils Absolute 0.0 0.0 - 0.1 K/uL  Troponin I   Result Value Ref Range   Troponin I <0.03 <0.031 ng/mL     Imaging Review Dg Chest 2 View  09/29/2014   CLINICAL DATA:  Sore throat, headache, productive cough. Body aches for 3 days.  EXAM: CHEST  2 VIEW  COMPARISON:  12/12/2013  FINDINGS: Hyperinflation and interstitial coarsening that is chronic. There is no edema, consolidation, effusion, or pneumothorax. Normal heart size and aortic/hilar contours. Cholecystectomy changes. Moderate thoracic dextroscoliosis.  IMPRESSION: No active cardiopulmonary disease.   Electronically Signed   By: Jorje Guild M.D.   On: 09/29/2014 07:36     EKG Interpretation None      Date: 09/29/2014  Rate: 58  Rhythm: normal sinus rhythm  QRS Axis: normal  Intervals: normal  ST/T Wave abnormalities: normal  Conduction Disutrbances:none  Narrative Interpretation:   Old EKG Reviewed: none available EKG evidence of left ventricular hypertrophy.    MDM   Final diagnoses:  Flu-like symptoms  URI (upper respiratory infection)   Symptoms consistent with flulike illness or upper respiratory infection. Chest x-ray negative for pneumonia. Rapid strep negative throat culture is pending. No evidence of strep pharyngitis based off of the rapid strep. Patient also had complain of chest pain on and off for a week preceding the upper respiratory symptoms which started 3 days ago. Troponin was negative EKG without any acute changes. As mentioned chest x-ray negative no evidence of acute cardiac event.  Patient was treated with pain medicine and Mucinex DM.  I personally performed the services described in this documentation, which was scribed in my presence. The recorded information has been reviewed and is accurate.      Fredia Sorrow, MD 09/29/14 1022

## 2014-09-29 NOTE — ED Notes (Signed)
Pt c/o sore throat, ha, productive cough-yellow/white sputum, and body aches x 3 days.

## 2014-10-01 DIAGNOSIS — H4011X1 Primary open-angle glaucoma, mild stage: Secondary | ICD-10-CM | POA: Diagnosis not present

## 2014-10-01 DIAGNOSIS — H209 Unspecified iridocyclitis: Secondary | ICD-10-CM | POA: Diagnosis not present

## 2014-10-01 LAB — CULTURE, GROUP A STREP

## 2014-10-08 ENCOUNTER — Ambulatory Visit: Payer: Medicare HMO | Admitting: Nurse Practitioner

## 2014-10-08 ENCOUNTER — Encounter: Payer: Self-pay | Admitting: Nurse Practitioner

## 2014-10-08 ENCOUNTER — Telehealth: Payer: Self-pay | Admitting: Internal Medicine

## 2014-10-08 NOTE — Telephone Encounter (Signed)
Noted  

## 2014-10-08 NOTE — Telephone Encounter (Signed)
PATIENT WAS A NO SHOW 10/08/14 AND LETTER WAS SENT

## 2014-10-16 ENCOUNTER — Telehealth: Payer: Self-pay

## 2014-10-16 DIAGNOSIS — R42 Dizziness and giddiness: Secondary | ICD-10-CM | POA: Diagnosis not present

## 2014-10-16 DIAGNOSIS — I10 Essential (primary) hypertension: Secondary | ICD-10-CM | POA: Diagnosis not present

## 2014-10-16 DIAGNOSIS — J40 Bronchitis, not specified as acute or chronic: Secondary | ICD-10-CM | POA: Diagnosis not present

## 2014-10-16 DIAGNOSIS — H9201 Otalgia, right ear: Secondary | ICD-10-CM | POA: Diagnosis not present

## 2014-10-16 NOTE — Telephone Encounter (Signed)
APPOINTMENT MADE WITH PATIENT

## 2014-10-16 NOTE — Telephone Encounter (Signed)
Megan Odom came by the office- she is aware that she missed her appt last week and would like for [facility] to call her this afternoon and get her rescheduled.

## 2014-10-20 DIAGNOSIS — H209 Unspecified iridocyclitis: Secondary | ICD-10-CM | POA: Diagnosis not present

## 2014-11-05 ENCOUNTER — Ambulatory Visit: Payer: Medicare HMO | Admitting: Nurse Practitioner

## 2014-11-05 ENCOUNTER — Other Ambulatory Visit: Payer: Self-pay

## 2014-11-05 ENCOUNTER — Encounter: Payer: Self-pay | Admitting: Nurse Practitioner

## 2014-11-05 ENCOUNTER — Ambulatory Visit (INDEPENDENT_AMBULATORY_CARE_PROVIDER_SITE_OTHER): Payer: Commercial Managed Care - HMO | Admitting: Nurse Practitioner

## 2014-11-05 VITALS — BP 144/78 | HR 59 | Temp 99.9°F | Ht 65.5 in | Wt 258.2 lb

## 2014-11-05 DIAGNOSIS — K7460 Unspecified cirrhosis of liver: Secondary | ICD-10-CM | POA: Diagnosis not present

## 2014-11-05 DIAGNOSIS — R1310 Dysphagia, unspecified: Secondary | ICD-10-CM

## 2014-11-05 DIAGNOSIS — Z8619 Personal history of other infectious and parasitic diseases: Secondary | ICD-10-CM | POA: Insufficient documentation

## 2014-11-05 DIAGNOSIS — R079 Chest pain, unspecified: Secondary | ICD-10-CM

## 2014-11-05 NOTE — Assessment & Plan Note (Signed)
Patient with symptoms of dysphagia of solid food which causes choking coughing and occasional regurgitation. She has never had an endoscopy. She would likely benefit from upper endoscopy for variceal screening at this point anyway since being is that she has never had one. However she has symptomology concerning for possible cardiac etiology including substernal chest pain with radiation to the jaw down to the fingertips with concurrent shortness of breath and dizziness. Refer her urgently to cardiology for evaluation of the symptoms. If cardiology clears her and there is no cardiac etiology for her symptoms we will then refer her for upper endoscopy for the evaluation of dysphagia and variceal screening. Possible etiologies for her dysphagia include long history of GERD associated esophagitis but cannot rule out stricture, ring, or web. Return to follow-up evaluation in 6 months.  Consider EGD for variceal screening and dysphagia with RMR AFTER seeing cardiology.

## 2014-11-05 NOTE — Patient Instructions (Addendum)
1. We are going to have a cardiologist see you for your chest pain/shortness of breath/dizziness symptoms 2. We are ordering labs, please have those done in the next 1-2 days 3. We will schedule an abdominal ultrasound for you to evaluate your liver 4. AFTER the cardiologist sees you, we will set you up for an endoscopy to evaluate your swallowing problems and check for swollen blood vessels related to your cirrhosis. 5. Return for follow-up in 6 months or sooner if any problems     Cirrhosis Cirrhosis is a condition of scarring of the liver which is caused when the liver has tried repairing itself following damage. This damage may come from a previous infection such as one of the forms of hepatitis (usually hepatitis C), or the damage may come from being injured by toxins. The main toxin that causes this damage is alcohol. The scarring of the liver from use of alcohol is irreversible. That means the liver cannot return to normal even though alcohol is not used any more. The main danger of hepatitis C infection is that it may cause long-lasting (chronic) liver disease, and this also may lead to cirrhosis. This complication is progressive and irreversible. CAUSES  Prior to available blood tests, hepatitis C could be contracted by blood transfusions. Since testing of blood has improved, this is now unlikely. This infection can also be contracted through intravenous drug use and the sharing of needles. It can also be contracted through sexual relationships. The injury caused by alcohol comes from too much use. It is not a few drinks that poison the liver, but years of misuse. Usually there will be some signs and symptoms early with scarring of the liver that suggest the development of better habits. Alcohol should never be used while using acetaminophen. A small dose of both taken together may cause irreversible damage to the liver. HOME CARE INSTRUCTIONS  There is no specific treatment for cirrhosis.  However, there are things you can do to avoid making the condition worse.  Rest as needed.  Eat a well-balanced diet. Your caregiver can help you with suggestions.  Vitamin supplements including vitamins A, K, D, and thiamine can help.  A low-salt diet, water restriction, or diuretic medicine may be needed to reduce fluid retention.  Avoid alcohol. This can be extremely toxic if combined with acetaminophen.  Avoid drugs which are toxic to the liver. Some of these include isoniazid, methyldopa, acetaminophen, anabolic steroids (muscle-building drugs), erythromycin, and oral contraceptives (birth control pills). Check with your caregiver to make sure medicines you are presently taking will not be harmful.  Periodic blood tests may be required. Follow your caregiver's advice regarding the timing of these.  Milk thistle is an herbal remedy which does protect the liver against toxins. However, it will not help once the liver has been scarred. SEEK MEDICAL CARE IF:  You have increasing fatigue or weakness.  You develop swelling of the hands, feet, legs, or face.  You vomit bright red blood, or a coffee ground appearing material.  You have blood in your stools, or the stools turn black and tarry.  You have a fever.  You develop loss of appetite, or have nausea and vomiting.  You develop jaundice.  You develop easy bruising or bleeding.  You have worsening of any of the problems you are concerned about. Document Released: 08/08/2005 Document Revised: 10/31/2011 Document Reviewed: 03/26/2008 [facility] Patient Information 2015 [place], [place]. This information is not intended to replace advice given to you by  your health care provider. Make sure you discuss any questions you have with your health care provider.  

## 2014-11-05 NOTE — Assessment & Plan Note (Signed)
Patient with a history of hepatitis C with previous treatment of interferon in 2012 unsuccessful due to persistent intolerable side effects. However she was successfully treated with Harvoni last year and undetectable viral load 3, the last of which was in December 2015. Infectious disease at [facility] health subsequently signed off on her care and referred her back to primary GI for continued cirrhosis care.

## 2014-11-05 NOTE — Progress Notes (Signed)
Referring Provider: Rosita Fire, MD Primary Care Physician:  Wardell Honour, MD Primary GI:  Dr. Gala Romney  Chief Complaint  Patient presents with  . Cirrhosis    HPI:   69 year old female presents for follow-up on cirrhosis care. Patient's been previously seen by Dr. Allie [facility] infectious disease for hepatitis C infection and liver cirrhosis. Last office visit was 07/30/2014. The patient presented and treated with interferon approximately 2012 which was stopped because of intolerable side effects. Last year the patient was seen by Infectious disease and was treated with Harvoni successfully with viral load undetectable in the last 3 lab draws the last of which was on 07/30/2014. Her last ID note patient with no longer needing follow-up appointment from infectious disease and can seek regular cirrhosis care from her primary GI. She has been vaccinated against hepatitis A. Her elastography was F4 but previously in 2012 her liver biopsy showed only F2 and it was theorized that her elastography was falsely elevated because of hepato-steatosis. Abdominal ultrasound on 9/16 showed questionable liver lesion but MRI on follow-up showed no lesions but mild nodularity that may represent cirrhosis. There is no abdominal imaging in our system. Colonoscopy by Dr. Buford Dresser 07/03/2014 with ablation and snare polypectomy showed normal-appearing rectal mucosa redundant colon scattered left side diverticula A5 millimeter polyp at the hepatic flexure and a 1 cm pedunculated polyp at the rectosigmoid junction there is also an adjacent diminutive polyp next to the large rectosigmoid polyp otherwise remainder of the colonic mucosa appeared normal. Pathology of the polypectomies showed both polyps, one in the hepatic flexure and 1 of the rectosigmoid junction, to be tubular adenoma and recommend repeat exam in 3 years.  Denies abdominal pain, scleral icterus, jaundice, confusion, generalized itching, excessive  bleeding/bruising. Has a bowel movement every 2-3 days. Occasional constipation and will take lactulose prn. Has tried Miralax without relief. States Lactulose works well for her. Has had headaches and nausea, told her PCP who recommended ENT referral. Has occasional dysphagia with solid foods including regurgitation and coughing. Has never had an endoscopy for variceal screening. Denies hematemesis. Admits occasional chest pain and dizziness along with dyspnea. Denies any other upper or lower GI problems.   Past Medical History  Diagnosis Date  . Hypertension   . Asthma   . Arthritis   . Hep C w/o coma, chronic   . Type 2 diabetes mellitus   . Hyperlipemia   . Depression     Past Surgical History  Procedure Laterality Date  . Cholecystectomy    . Appendectomy    . Abdominal hysterectomy    . Breast surgery    . Colonoscopy N/A 07/03/2014    treated/removed as above    Current Outpatient Prescriptions  Medication Sig Dispense Refill  . albuterol (PROVENTIL HFA;VENTOLIN HFA) 108 (90 BASE) MCG/ACT inhaler Inhale 2 puffs into the lungs every 6 (six) hours as needed. Shortness of breath     . albuterol (PROVENTIL) (2.5 MG/3ML) 0.083% nebulizer solution Take 2.5 mg by nebulization 3 (three) times daily as needed for shortness of breath. For shortness of breath    . aspirin EC 81 MG tablet Take 81 mg by mouth daily.      . beclomethasone (QVAR) 40 MCG/ACT inhaler Inhale 1 puff into the lungs 2 (two) times daily as needed (shortness of breath).     . benzonatate (TESSALON) 100 MG capsule Take 200 mg by mouth 3 (three) times daily as needed for cough.    Marland Kitchen  dextromethorphan-guaiFENesin (MUCINEX DM) 30-600 MG per 12 hr tablet Take 1 tablet by mouth every 12 (twelve) hours. 14 tablet 1  . ergocalciferol (VITAMIN D2) 50000 UNITS capsule Take 50,000 Units by mouth once a week. sunday    . fluticasone (FLONASE) 50 MCG/ACT nasal spray Place 2 sprays into the nose daily as needed for allergies.       . furosemide (LASIX) 40 MG tablet Take 40 mg by mouth daily.      Marland Kitchen latanoprost (XALATAN) 0.005 % ophthalmic solution Place 1 drop into the right eye at bedtime.    Marland Kitchen loratadine (CLARITIN) 10 MG tablet Take 10 mg by mouth daily.     Marland Kitchen losartan-hydrochlorothiazide (HYZAAR) 100-25 MG per tablet Take 1 tablet by mouth daily.    . Omega-3 Fatty Acids (OMEGA-3 FISH OIL) 1200 MG CAPS Take 1,200 mg by mouth every morning.    Marland Kitchen omeprazole (PRILOSEC) 40 MG capsule Take 40 mg by mouth daily.     Marland Kitchen PARoxetine (PAXIL) 20 MG tablet Take 20 mg by mouth 2 (two) times daily.    . potassium chloride (K-DUR) 10 MEQ tablet Take 10 mEq by mouth daily.    . verapamil (CALAN-SR) 120 MG CR tablet Take 1 tablet (120 mg total) by mouth daily. NEED APPOINTMENT FOR FUTURE REFILLS. 10 tablet 0  . dextromethorphan-guaiFENesin (MUCINEX DM) 30-600 MG per 12 hr tablet Take 1 tablet by mouth 2 (two) times daily. (Patient not taking: Reported on 11/05/2014) 14 tablet 1  . HYDROcodone-acetaminophen (NORCO/VICODIN) 5-325 MG per tablet Take 1-2 tablets by mouth every 6 (six) hours as needed. (Patient not taking: Reported on 11/05/2014) 14 tablet 0  . ibuprofen (ADVIL,MOTRIN) 800 MG tablet Take 800 mg by mouth 2 (two) times daily as needed for pain. Pain     . Ledipasvir-Sofosbuvir (HARVONI) 90-400 MG TABS Take 1 tablet by mouth daily.     . pregabalin (LYRICA) 50 MG capsule Take 50 mg by mouth 2 (two) times daily.    . traMADol-acetaminophen (ULTRACET) 37.5-325 MG per tablet Take 1 tablet by mouth 2 (two) times daily as needed for moderate pain.      No current facility-administered medications for this visit.    Allergies as of 11/05/2014 - Review Complete 11/05/2014  Allergen Reaction Noted  . Darvocet [propoxyphene n-acetaminophen] Other (See Comments) 02/26/2011  . Latex Swelling 04/24/2012  . Other Other (See Comments) 02/13/2013  . Penicillins Other (See Comments) 02/26/2011  . Tetracyclines & related Other (See  Comments) 02/26/2011  . Adhesive [tape] Itching and Rash 12/04/2012    Family History  Problem Relation Age of Onset  . Alcohol abuse Mother   . Early death Mother   . Early death Father   . Alcohol abuse Sister   . HIV Brother   . Diabetes Brother   . Heart disease Brother   . Hepatitis C Daughter   . Hypertension Son   . Cancer Maternal Aunt   . Hypertension Maternal Aunt   . Heart disease Maternal Aunt   . Cancer Maternal Uncle   . Hypertension Maternal Uncle   . Heart disease Brother   . Diabetes Brother     History   Social History  . Marital Status: Widowed    Spouse Name: N/A  . Number of Children: N/A  . Years of Education: GED   Occupational History  .     Social History Main Topics  . Smoking status: Current Every Day Smoker -- 0.50 packs/day for 50 years  .  Smokeless tobacco: Never Used     Comment: one pack every 2-3 days  . Alcohol Use: No  . Drug Use: No  . Sexual Activity: Not Currently    Birth Control/ Protection: Surgical   Other Topics Concern  . None   Social History Narrative    Review of Systems: Gen: Denies fever, chills, anorexia. Denies fatigue, weakness, weight loss.  CV: Denies chest pain, palpitations, syncope, peripheral edema, and claudication. Resp: Denies dyspnea at rest, cough, wheezing, coughing up blood, and pleurisy. GI: Denies vomiting blood, jaundice, and fecal incontinence.   Denies dysphagia or odynophagia. Derm: Denies rash, itching, dry skin Psych: Denies depression, anxiety, memory loss, confusion. No homicidal or suicidal ideation.  Heme: Denies bruising, bleeding, and enlarged lymph nodes.  Physical Exam: BP 144/78 mmHg  Pulse 59  Temp(Src) 99.9 F (37.7 C) (Oral)  Ht 5' 5.5" (1.664 m)  Wt 258 lb 3.2 oz (117.119 kg)  BMI 42.30 kg/m2 General:   Obese female, alert and oriented. No distress noted. Pleasant and cooperative.  Head:  Normocephalic and atraumatic. Eyes:  Conjuctiva clear without scleral  icterus. Mouth:  Oral mucosa pink and moist. Good dentition. No lesions. No OP edema. Neck:  Supple, without mass or thyromegaly. Lungs:  Clear to auscultation bilaterally. No wheezes, rales, or rhonchi. No distress.  Heart:  S1, S2 present without murmurs, rubs, or gallops. Regular rate and rhythm. Abdomen:  +BS, soft, non-tender and non-distended. No rebound or guarding. No HSM or masses noted. Msk:  Symmetrical without gross deformities. Normal posture. Pulses:  2+ DP noted bilaterally Extremities:  Without edema. Neurologic:  Alert and  oriented x4;  grossly normal neurologically. Skin:  Intact without significant lesions or rashes. Cervical Nodes:  No significant cervical adenopathy. Psych:  Alert and cooperative. Normal mood and affect.    11/05/2014 2:04 PM

## 2014-11-05 NOTE — Assessment & Plan Note (Signed)
69 year old obese female complains of occasional chest pain which is substernal to the left-sided chest and radiates into her jaw and arm with numbness and tingling in fingertips along with concurrent dizziness and shortness of breath. This is concerning for possible cardiac etiology given her past medical history and presentation of symptoms. We will request an urgent cardiology referral to follow up on this. She has seen cardiology before but has not been back there in over a year. We will hold off on invasive endoscopy until she is seen cardiology and determined to be safe for procedure.

## 2014-11-05 NOTE — Assessment & Plan Note (Addendum)
Patient with hepatitis C cirrhosis successfully treated treated with Harvoni last year with undetectable viral load 3, the last of which was in December 2015. She has not had any abdominal imaging here nor has she had upper endoscopy for variceal screening. Additionally she has not had recent cirrhosis labs. Today to initiate routine cirrhosis care we will order labs including CMP, CBC, AFP, and PT/INR. We will also order an abdominal ultrasound to screen for liver lesions and HCC. Urgent cardiology referral for chest pain assessment and plan. Assuming cardiology clearance and no cardiac etiology of her chest pain, we will then refer her for endoscopy for variceal screening and dysphagia. Return for follow-up evaluation 6 months.  Consider EGD for variceal screening and dysphagia with RMR AFTER seeing cardiology.

## 2014-11-08 DIAGNOSIS — K7460 Unspecified cirrhosis of liver: Secondary | ICD-10-CM | POA: Diagnosis not present

## 2014-11-09 LAB — CBC WITH DIFFERENTIAL/PLATELET
Basophils Absolute: 0 10*3/uL (ref 0.0–0.1)
Basophils Relative: 0 % (ref 0–1)
Eosinophils Absolute: 0.1 10*3/uL (ref 0.0–0.7)
Eosinophils Relative: 1 % (ref 0–5)
HCT: 37.8 % (ref 36.0–46.0)
Hemoglobin: 12.6 g/dL (ref 12.0–15.0)
Lymphocytes Relative: 38 % (ref 12–46)
Lymphs Abs: 2.7 10*3/uL (ref 0.7–4.0)
MCH: 29.1 pg (ref 26.0–34.0)
MCHC: 33.3 g/dL (ref 30.0–36.0)
MCV: 87.3 fL (ref 78.0–100.0)
MPV: 12.4 fL (ref 8.6–12.4)
Monocytes Absolute: 0.9 10*3/uL (ref 0.1–1.0)
Monocytes Relative: 13 % — ABNORMAL HIGH (ref 3–12)
Neutro Abs: 3.4 10*3/uL (ref 1.7–7.7)
Neutrophils Relative %: 48 % (ref 43–77)
Platelets: 249 10*3/uL (ref 150–400)
RBC: 4.33 MIL/uL (ref 3.87–5.11)
RDW: 12.8 % (ref 11.5–15.5)
WBC: 7 10*3/uL (ref 4.0–10.5)

## 2014-11-09 LAB — COMPREHENSIVE METABOLIC PANEL
ALT: 30 U/L (ref 0–35)
AST: 35 U/L (ref 0–37)
Albumin: 3.8 g/dL (ref 3.5–5.2)
Alkaline Phosphatase: 165 U/L — ABNORMAL HIGH (ref 39–117)
BUN: 18 mg/dL (ref 6–23)
CO2: 22 mEq/L (ref 19–32)
Calcium: 9.1 mg/dL (ref 8.4–10.5)
Chloride: 104 mEq/L (ref 96–112)
Creat: 0.96 mg/dL (ref 0.50–1.10)
Glucose, Bld: 100 mg/dL — ABNORMAL HIGH (ref 70–99)
Potassium: 3.6 mEq/L (ref 3.5–5.3)
Sodium: 136 mEq/L (ref 135–145)
Total Bilirubin: 0.4 mg/dL (ref 0.2–1.2)
Total Protein: 9 g/dL — ABNORMAL HIGH (ref 6.0–8.3)

## 2014-11-09 LAB — PROTIME-INR
INR: 1.12 (ref <1.50)
Prothrombin Time: 14.4 seconds (ref 11.6–15.2)

## 2014-11-10 ENCOUNTER — Ambulatory Visit (HOSPITAL_COMMUNITY): Payer: Commercial Managed Care - HMO

## 2014-11-10 LAB — AFP TUMOR MARKER: AFP-Tumor Marker: 6.1 ng/mL — ABNORMAL HIGH (ref <6.1)

## 2014-11-11 ENCOUNTER — Ambulatory Visit (HOSPITAL_COMMUNITY)
Admission: RE | Admit: 2014-11-11 | Discharge: 2014-11-11 | Disposition: A | Discharge status: Home / Self Care | Payer: Commercial Managed Care - HMO | Source: Ambulatory Visit | Attending: Nurse Practitioner | Admitting: Nurse Practitioner

## 2014-11-11 DIAGNOSIS — C220 Liver cell carcinoma: Secondary | ICD-10-CM | POA: Insufficient documentation

## 2014-11-11 DIAGNOSIS — N281 Cyst of kidney, acquired: Secondary | ICD-10-CM | POA: Diagnosis not present

## 2014-11-11 DIAGNOSIS — K7460 Unspecified cirrhosis of liver: Secondary | ICD-10-CM | POA: Diagnosis not present

## 2014-11-11 NOTE — Progress Notes (Signed)
CC'ED TO PCP 

## 2014-11-17 ENCOUNTER — Telehealth: Payer: Self-pay

## 2014-11-17 NOTE — Telephone Encounter (Signed)
Pt is calling for her [place] results.

## 2014-11-17 NOTE — Telephone Encounter (Signed)
I have made notes to her results. Her US shows her liver is consistent with liver disease but there are no concerning lesions or masses noted. Her labs are essentially normal. Continue with routine cirrhosis follow-up care

## 2014-11-18 NOTE — Telephone Encounter (Signed)
Pt is aware of results. 

## 2014-11-20 ENCOUNTER — Ambulatory Visit (INDEPENDENT_AMBULATORY_CARE_PROVIDER_SITE_OTHER): Payer: Commercial Managed Care - HMO | Admitting: Internal Medicine

## 2014-11-20 ENCOUNTER — Encounter: Payer: Self-pay | Admitting: Internal Medicine

## 2014-11-20 VITALS — BP 146/72 | HR 59 | Ht 65.5 in | Wt 262.2 lb

## 2014-11-20 DIAGNOSIS — R079 Chest pain, unspecified: Secondary | ICD-10-CM

## 2014-11-20 DIAGNOSIS — R5383 Other fatigue: Secondary | ICD-10-CM

## 2014-11-20 DIAGNOSIS — R002 Palpitations: Secondary | ICD-10-CM | POA: Diagnosis not present

## 2014-11-20 DIAGNOSIS — Z79899 Other long term (current) drug therapy: Secondary | ICD-10-CM | POA: Diagnosis not present

## 2014-11-20 DIAGNOSIS — R51 Headache: Secondary | ICD-10-CM

## 2014-11-20 DIAGNOSIS — G44019 Episodic cluster headache, not intractable: Secondary | ICD-10-CM

## 2014-11-20 DIAGNOSIS — R0683 Snoring: Secondary | ICD-10-CM

## 2014-11-20 DIAGNOSIS — I471 Supraventricular tachycardia: Secondary | ICD-10-CM

## 2014-11-20 DIAGNOSIS — R42 Dizziness and giddiness: Secondary | ICD-10-CM | POA: Diagnosis not present

## 2014-11-20 DIAGNOSIS — R519 Headache, unspecified: Secondary | ICD-10-CM

## 2014-11-20 DIAGNOSIS — Z8619 Personal history of other infectious and parasitic diseases: Secondary | ICD-10-CM

## 2014-11-20 DIAGNOSIS — G4733 Obstructive sleep apnea (adult) (pediatric): Secondary | ICD-10-CM

## 2014-11-20 DIAGNOSIS — I10 Essential (primary) hypertension: Secondary | ICD-10-CM

## 2014-11-20 DIAGNOSIS — I4719 Other supraventricular tachycardia: Secondary | ICD-10-CM

## 2014-11-20 MED ORDER — VERAPAMIL HCL ER 180 MG PO CP24: 180.0000 mg | ORAL_CAPSULE | Freq: Every day | ORAL | Status: DC

## 2014-11-20 NOTE — Progress Notes (Signed)
OFFICE NOTE  Chief Complaint:  Headache, dizziness, palpitations, sharp chest pain  Primary Care Physician: Rosita Fire, MD  HPI:  Megan Odom is a 69 year old female patient who reveals he saw Dr. Loletha Grayer in the office in 2014. She was a new evaluation for chest pain and shortness of breath. After evaluation she was found to have PAT and her medications were adjusted, switching her amlodipine over to verapamil. In general this is helped with her palpitations however recently she's had more palpitations. She wore a monitor which caused some skin reactions and she still has some darkening of the skin. Because of this she does not want to wear anymore monitors. She's also complaining of left sided sharp chest discomfort which comes and goes and is somewhat worse at night when laying down. The symptoms last first few seconds to minutes. None of it is associated with exertion or relieved by rest. She has had more weight gain over the past 2 years. In 2014 she underwent a sleep study at [facility] which demonstrated no evidence for significant apnea. She reports she still is fatigued during the day and has trouble falling asleep at night. Oftentimes she is up till 3 AM and then is very tired the next day. She's been told that she does snore and she oftentimes gasp for breath. She does wake up with headaches in the morning. Finally, she reported that she's been recently having problems with headaches. She seen an ear nose and throat specialist before and was diagnosed with reflux. She said the other day she was driving and had acute headache followed by dizziness. She said it felt like "something popped in my head". She denied any slurred speech, weakness or other strokelike symptoms.  PMHx:  Past Medical History  Diagnosis Date  . Hypertension   . Asthma   . Arthritis   . Hep C w/o coma, chronic   . Type 2 diabetes mellitus   . Hyperlipemia   . Depression     Past Surgical History   Procedure Laterality Date  . Cholecystectomy    . Appendectomy    . Abdominal hysterectomy    . Breast surgery    . Colonoscopy N/A 07/03/2014    treated/removed as above    FAMHx:  Family History  Problem Relation Age of Onset  . Alcohol abuse Mother   . Early death Mother   . Early death Father   . Alcohol abuse Sister   . HIV Brother   . Diabetes Brother   . Heart disease Brother   . Hepatitis C Daughter   . Hypertension Son   . Cancer Maternal Aunt   . Hypertension Maternal Aunt   . Heart disease Maternal Aunt   . Cancer Maternal Uncle   . Hypertension Maternal Uncle   . Heart disease Brother   . Diabetes Brother     SOCHx:   reports that she has been smoking.  She has never used smokeless tobacco. She reports that she does not drink alcohol or use illicit drugs.  ALLERGIES:  Allergies  Allergen Reactions  . Darvocet [Propoxyphene N-Acetaminophen] Other (See Comments)    Hurts ears and side of face   . Latex Swelling  . Other Other (See Comments)    Hairspray(isoaplus)-causes big soars in head  . Penicillins Other (See Comments)    Hurts ears and side of face   . Tetracyclines & Related Other (See Comments)    Hurts ears and side of face  .  Adhesive [Tape] Itching and Rash    Telemetry pads cause itching and rash at site    ROS: A comprehensive review of systems was negative except for: Constitutional: positive for fatigue Ears, nose, mouth, throat, and face: positive for earaches, snoring and sore throat Cardiovascular: positive for chest pain and palpitations Gastrointestinal: positive for reflux symptoms  HOME MEDS: Current Outpatient Prescriptions  Medication Sig Dispense Refill  . albuterol (PROVENTIL HFA;VENTOLIN HFA) 108 (90 BASE) MCG/ACT inhaler Inhale 2 puffs into the lungs every 6 (six) hours as needed. Shortness of breath     . albuterol (PROVENTIL) (2.5 MG/3ML) 0.083% nebulizer solution Take 2.5 mg by nebulization 3 (three) times daily as  needed for shortness of breath. For shortness of breath    . aspirin EC 81 MG tablet Take 81 mg by mouth daily.      . beclomethasone (QVAR) 40 MCG/ACT inhaler Inhale 1 puff into the lungs 2 (two) times daily as needed (shortness of breath).     . benzonatate (TESSALON) 100 MG capsule Take 200 mg by mouth 3 (three) times daily as needed for cough.    . dextromethorphan-guaiFENesin (MUCINEX DM) 30-600 MG per 12 hr tablet Take 1 tablet by mouth every 12 (twelve) hours. 14 tablet 1  . ergocalciferol (VITAMIN D2) 50000 UNITS capsule Take 50,000 Units by mouth once a week. sunday    . fluticasone (FLONASE) 50 MCG/ACT nasal spray Place 2 sprays into the nose daily as needed for allergies.     . furosemide (LASIX) 40 MG tablet Take 40 mg by mouth daily.      Marland Kitchen ibuprofen (ADVIL,MOTRIN) 800 MG tablet Take 800 mg by mouth every 8 (eight) hours as needed.    . latanoprost (XALATAN) 0.005 % ophthalmic solution Place 1 drop into the right eye at bedtime.    Marland Kitchen loratadine (CLARITIN) 10 MG tablet Take 10 mg by mouth daily.     Marland Kitchen losartan-hydrochlorothiazide (HYZAAR) 100-25 MG per tablet Take 1 tablet by mouth daily.    . Omega-3 Fatty Acids (OMEGA-3 FISH OIL) 1200 MG CAPS Take 1,200 mg by mouth every morning.    Marland Kitchen omeprazole (PRILOSEC) 40 MG capsule Take 40 mg by mouth daily.     Marland Kitchen PARoxetine (PAXIL) 20 MG tablet Take 20 mg by mouth 2 (two) times daily.    . potassium chloride (K-DUR) 10 MEQ tablet Take 10 mEq by mouth daily.    . verapamil (VERELAN PM) 180 MG 24 hr capsule Take 1 capsule (180 mg total) by mouth at bedtime. 30 capsule 6   No current facility-administered medications for this visit.    LABS/IMAGING: No results found for this or any previous visit (from the past 48 hour(s)). No results found.  VITALS: BP 146/72 mmHg  Pulse 59  Ht 5' 5.5" (1.664 m)  Wt 262 lb 3.2 oz (118.933 kg)  BMI 42.95 kg/m2  EXAM: General appearance: alert, no distress and morbidly obese Neck: no carotid bruit  and no JVD Lungs: clear to auscultation bilaterally Heart: regular rate and rhythm, S1, S2 normal, no murmur, click, rub or gallop Abdomen: soft, non-tender; bowel sounds normal; no masses,  no organomegaly Extremities: extremities normal, atraumatic, no cyanosis or edema Pulses: 2+ and symmetric Skin: Skin color, texture, turgor normal. No rashes or lesions Neurologic: Grossly normal Psych: Appears tired  EKG: Sinus bradycardia 59, incomplete right bundle branch block, minimal voltage criteria for LVH  ASSESSMENT: 1. Palpitations-history of PAT 2. Hypertension-not at goal 3. Morbid obesity 4. Headaches  with an acute headache followed by dizziness 5. GERD 6. Nonrestorative sleep concerning for possible sleep apnea 7. Atypical chest wall pain  PLAN: 1. Atypical chest pain - Mrs. Koelling underwent nuclear stress testing in 2014 which was negative. Her chest pain is very atypical and I do not feel needs to be reassessed at this time.  2. GERD - the symptoms still persist, despite taking omeprazole twice daily. She is scheduled to see an ENT and a gastroenterologist who may adjust her medications. 3. Palpitations- I suspect this is recurrent PAT. Some of the symptoms are worse with anxiety. Blood pressure is not at goal therefore I would increase her verapamil to 180 mg daily. 4. Hypertension- uncontrolled. Will increase verapamil 5. Fatigue/nonrestorative sleep- trouble both falling asleep and with daytime somnolence. Although she had a negative sleep study 2 years ago she's had more weight gain and may likely have sleep apnea. She is having morning headaches and will need reevaluated. I recommend a repeat sleep study 6. Headaches-with associated dizziness - she is having recurrent headaches and had one episode last week while driving with a severe headache followed by dizziness. I recommend a head MRI to further evaluate this.  We'll contact her with the studies and plan follow-up in  the office with Dr. Drue Stager, MD, [facility] Attending Cardiologist [facility]  Tarrell Debes C 11/20/2014, 9:57 AM

## 2014-11-20 NOTE — Patient Instructions (Addendum)
Your physician has recommended you make the following change in your medication: INCREASE verapamil to 180mg  daily (a new prescription has been sent to your pharmacy)  Your physician has recommended that you have a sleep study. This test records several body functions during sleep, including: brain activity, eye movement, oxygen and carbon dioxide blood levels, heart rate and rhythm, breathing rate and rhythm, the flow of air through your mouth and nose, snoring, body muscle movements, and chest and belly movement. >> this is done at [facility]  Dr. Debara Pickett has ordered a MRI of your head/brain - due to your headaches and dizziness - done at [facility] >> you will need to have labs(at [name]) done about 2 days prior to this test  Your physician recommends that you schedule a follow-up appointment in 2-3 months with Dr. Sallyanne Kuster

## 2014-11-26 ENCOUNTER — Telehealth: Payer: Self-pay | Admitting: Cardiovascular Disease

## 2014-11-26 DIAGNOSIS — R220 Localized swelling, mass and lump, head: Secondary | ICD-10-CM | POA: Diagnosis not present

## 2014-11-26 DIAGNOSIS — J310 Chronic rhinitis: Secondary | ICD-10-CM | POA: Diagnosis not present

## 2014-11-26 DIAGNOSIS — G4733 Obstructive sleep apnea (adult) (pediatric): Secondary | ICD-10-CM | POA: Diagnosis not present

## 2014-11-26 DIAGNOSIS — K219 Gastro-esophageal reflux disease without esophagitis: Secondary | ICD-10-CM | POA: Diagnosis not present

## 2014-11-26 NOTE — Telephone Encounter (Signed)
I said we could give her something to help her sleep the night of the sleep study. Go ahead and give her Ambien 10 mg pills, #10, no refills - advise her to take it 1 hour before her sleep study. Should have someone else drive her to that appointment.  Dr.H

## 2014-11-26 NOTE — Telephone Encounter (Signed)
Returned call to patient she stated when she saw Dr.Hilty he said he would send a sleeping pill to her pharmacy.Stated she did not receive sleeping pill.Advised I will send message to Dr.Hilty.Also I will send message to schedulers to schedule sleep study at [facility] and schedule MRI of brain at [facility].

## 2014-11-26 NOTE — Telephone Encounter (Signed)
Returned call to patient no answer.LMTC. 

## 2014-11-26 NOTE — Telephone Encounter (Signed)
Pt said she was seen Friday or Monday. She thought her medicine was going to be called in,because he was increasing her medicine. She also said he was going to call in something to help her sleep.

## 2014-11-27 MED ORDER — ZOLPIDEM TARTRATE 10 MG PO TABS: 10.0000 mg | ORAL_TABLET | Freq: Every evening | ORAL | Status: DC | PRN

## 2014-11-27 NOTE — Telephone Encounter (Signed)
Returned call to patient Dr.Hilty prescribed Ambien 10 mg.Advised take 10 mg 1 hour before sleep study.He advised someone else to drive you to sleep study appointment.

## 2014-12-03 ENCOUNTER — Ambulatory Visit (HOSPITAL_COMMUNITY): Payer: Commercial Managed Care - HMO

## 2014-12-05 DIAGNOSIS — K116 Mucocele of salivary gland: Secondary | ICD-10-CM | POA: Diagnosis not present

## 2014-12-05 DIAGNOSIS — R220 Localized swelling, mass and lump, head: Secondary | ICD-10-CM | POA: Diagnosis not present

## 2014-12-08 ENCOUNTER — Ambulatory Visit: Payer: Commercial Managed Care - HMO | Admitting: Nurse Practitioner

## 2014-12-09 ENCOUNTER — Emergency Department (HOSPITAL_COMMUNITY): Payer: Commercial Managed Care - HMO

## 2014-12-09 ENCOUNTER — Emergency Department (HOSPITAL_COMMUNITY)
Admission: EM | Admit: 2014-12-09 | Discharge: 2014-12-09 | Disposition: A | Discharge status: Home / Self Care | Payer: Commercial Managed Care - HMO | Attending: Emergency Medicine | Admitting: Emergency Medicine

## 2014-12-09 ENCOUNTER — Encounter (HOSPITAL_COMMUNITY): Payer: Self-pay | Admitting: Emergency Medicine

## 2014-12-09 DIAGNOSIS — Z79899 Other long term (current) drug therapy: Secondary | ICD-10-CM | POA: Diagnosis not present

## 2014-12-09 DIAGNOSIS — Z880 Allergy status to penicillin: Secondary | ICD-10-CM | POA: Diagnosis not present

## 2014-12-09 DIAGNOSIS — Z9049 Acquired absence of other specified parts of digestive tract: Secondary | ICD-10-CM | POA: Diagnosis not present

## 2014-12-09 DIAGNOSIS — R197 Diarrhea, unspecified: Secondary | ICD-10-CM | POA: Diagnosis not present

## 2014-12-09 DIAGNOSIS — I10 Essential (primary) hypertension: Secondary | ICD-10-CM | POA: Diagnosis not present

## 2014-12-09 DIAGNOSIS — N390 Urinary tract infection, site not specified: Secondary | ICD-10-CM | POA: Insufficient documentation

## 2014-12-09 DIAGNOSIS — R1084 Generalized abdominal pain: Secondary | ICD-10-CM | POA: Diagnosis present

## 2014-12-09 DIAGNOSIS — R112 Nausea with vomiting, unspecified: Secondary | ICD-10-CM | POA: Insufficient documentation

## 2014-12-09 DIAGNOSIS — F329 Major depressive disorder, single episode, unspecified: Secondary | ICD-10-CM | POA: Insufficient documentation

## 2014-12-09 DIAGNOSIS — J45909 Unspecified asthma, uncomplicated: Secondary | ICD-10-CM | POA: Diagnosis not present

## 2014-12-09 DIAGNOSIS — Z8639 Personal history of other endocrine, nutritional and metabolic disease: Secondary | ICD-10-CM | POA: Insufficient documentation

## 2014-12-09 DIAGNOSIS — Z8619 Personal history of other infectious and parasitic diseases: Secondary | ICD-10-CM | POA: Diagnosis not present

## 2014-12-09 DIAGNOSIS — R531 Weakness: Secondary | ICD-10-CM | POA: Diagnosis not present

## 2014-12-09 DIAGNOSIS — Z7952 Long term (current) use of systemic steroids: Secondary | ICD-10-CM | POA: Diagnosis not present

## 2014-12-09 DIAGNOSIS — Z91040 Latex allergy status: Secondary | ICD-10-CM | POA: Insufficient documentation

## 2014-12-09 DIAGNOSIS — E119 Type 2 diabetes mellitus without complications: Secondary | ICD-10-CM | POA: Diagnosis not present

## 2014-12-09 DIAGNOSIS — Z90710 Acquired absence of both cervix and uterus: Secondary | ICD-10-CM | POA: Diagnosis not present

## 2014-12-09 DIAGNOSIS — Z720 Tobacco use: Secondary | ICD-10-CM | POA: Diagnosis not present

## 2014-12-09 DIAGNOSIS — Z7982 Long term (current) use of aspirin: Secondary | ICD-10-CM | POA: Insufficient documentation

## 2014-12-09 DIAGNOSIS — R109 Unspecified abdominal pain: Secondary | ICD-10-CM | POA: Diagnosis not present

## 2014-12-09 LAB — CBC WITH DIFFERENTIAL/PLATELET
Basophils Absolute: 0 10*3/uL (ref 0.0–0.1)
Basophils Relative: 0 % (ref 0–1)
Eosinophils Absolute: 0.1 10*3/uL (ref 0.0–0.7)
Eosinophils Relative: 1 % (ref 0–5)
HCT: 36.5 % (ref 36.0–46.0)
Hemoglobin: 11.8 g/dL — ABNORMAL LOW (ref 12.0–15.0)
Lymphocytes Relative: 40 % (ref 12–46)
Lymphs Abs: 2.4 10*3/uL (ref 0.7–4.0)
MCH: 29.1 pg (ref 26.0–34.0)
MCHC: 32.3 g/dL (ref 30.0–36.0)
MCV: 90.1 fL (ref 78.0–100.0)
Monocytes Absolute: 0.5 10*3/uL (ref 0.1–1.0)
Monocytes Relative: 9 % (ref 3–12)
Neutro Abs: 3 10*3/uL (ref 1.7–7.7)
Neutrophils Relative %: 50 % (ref 43–77)
Platelets: 197 10*3/uL (ref 150–400)
RBC: 4.05 MIL/uL (ref 3.87–5.11)
RDW: 12.4 % (ref 11.5–15.5)
WBC: 6 10*3/uL (ref 4.0–10.5)

## 2014-12-09 LAB — COMPREHENSIVE METABOLIC PANEL
ALT: 58 U/L — ABNORMAL HIGH (ref 0–35)
AST: 69 U/L — ABNORMAL HIGH (ref 0–37)
Albumin: 3.3 g/dL — ABNORMAL LOW (ref 3.5–5.2)
Alkaline Phosphatase: 133 U/L — ABNORMAL HIGH (ref 39–117)
Anion gap: 8 (ref 5–15)
BUN: 15 mg/dL (ref 6–23)
CO2: 25 mmol/L (ref 19–32)
Calcium: 8.6 mg/dL (ref 8.4–10.5)
Chloride: 104 mmol/L (ref 96–112)
Creatinine, Ser: 0.71 mg/dL (ref 0.50–1.10)
GFR calc Af Amer: >90 mL/min (ref >90)
GFR calc non Af Amer: 87 mL/min — ABNORMAL LOW (ref >90)
Glucose, Bld: 89 mg/dL (ref 70–99)
Potassium: 3.2 mmol/L — ABNORMAL LOW (ref 3.5–5.1)
Sodium: 137 mmol/L (ref 135–145)
Total Bilirubin: 0.5 mg/dL (ref 0.3–1.2)
Total Protein: 8.9 g/dL — ABNORMAL HIGH (ref 6.0–8.3)

## 2014-12-09 LAB — URINALYSIS, ROUTINE W REFLEX MICROSCOPIC
Bilirubin Urine: NEGATIVE
Glucose, UA: NEGATIVE mg/dL
Hgb urine dipstick: NEGATIVE
Ketones, ur: NEGATIVE mg/dL
Nitrite: NEGATIVE
Protein, ur: 30 mg/dL — AB
Specific Gravity, Urine: 1.02 (ref 1.005–1.030)
Urobilinogen, UA: 2 mg/dL — ABNORMAL HIGH (ref 0.0–1.0)
pH: 7 (ref 5.0–8.0)

## 2014-12-09 LAB — LIPASE, BLOOD: Lipase: 23 U/L (ref 11–59)

## 2014-12-09 LAB — URINE MICROSCOPIC-ADD ON

## 2014-12-09 LAB — CK: Total CK: 144 U/L (ref 7–177)

## 2014-12-09 MED ORDER — ONDANSETRON 8 MG PO TBDP: 8.0000 mg | ORAL_TABLET | Freq: Three times a day (TID) | ORAL | Status: DC | PRN

## 2014-12-09 MED ORDER — CEPHALEXIN 500 MG PO CAPS: 500.0000 mg | ORAL_CAPSULE | Freq: Three times a day (TID) | ORAL | Status: DC

## 2014-12-09 MED ORDER — MORPHINE SULFATE 4 MG/ML IJ SOLN
4.0000 mg | INTRAMUSCULAR | Status: DC | PRN
  Administered 2014-12-09: 4 mg via INTRAVENOUS
  Filled 2014-12-09: qty 1

## 2014-12-09 MED ORDER — ONDANSETRON HCL 4 MG/2ML IJ SOLN
4.0000 mg | Freq: Once | INTRAMUSCULAR | Status: AC
  Administered 2014-12-09: 4 mg via INTRAVENOUS
  Filled 2014-12-09: qty 2

## 2014-12-09 MED ORDER — CEPHALEXIN 500 MG PO CAPS
1000.0000 mg | ORAL_CAPSULE | Freq: Once | ORAL | Status: AC
  Administered 2014-12-09: 1000 mg via ORAL
  Filled 2014-12-09: qty 2

## 2014-12-09 MED ORDER — SODIUM CHLORIDE 0.9 % IV BOLUS (SEPSIS)
1000.0000 mL | Freq: Once | INTRAVENOUS | Status: AC
  Administered 2014-12-09: 1000 mL via INTRAVENOUS

## 2014-12-09 NOTE — ED Notes (Addendum)
Pt reports headache, generalized weakness, abdominal pain,n/v, "strong urine odor" x3 weeks. nad noted.

## 2014-12-09 NOTE — ED Provider Notes (Signed)
CSN: [id number]     Arrival date & time 12/09/14  1038 History  This chart was scribe for Jola Schmidt, MD by Judithann Sauger, ED Scribe. The patient was seen in room APA07/APA07 and the patient's care was started at 11:45 AM.    Chief Complaint  Patient presents with  . Abdominal Pain   The history is provided by the patient. No language interpreter was used.   HPI Comments: Megan Odom is a 69 y.o. female with a hx of DM and HTN who presents to the Emergency Department complaining of nausea vomiting and diarrhea 3 weeks.  She reports cough with occasional shortness of breath.  She reports some generalized abdominal cramping as well.  She's had ongoing nausea and thus reports his been affecting her eating.  She reports generalized weakness without focal weakness of her arm or leg.  No headache.  She reports she's had no urinary incontinence and her urine has a "strong odor".  Symptoms are mild in severity.  She's not seen her primary care physician about the symptoms.  Denies back pain or flank pain.  No chest pain.   Past Medical History  Diagnosis Date  . Hypertension   . Asthma   . Arthritis   . Hep C w/o coma, chronic   . Type 2 diabetes mellitus   . Hyperlipemia   . Depression    Past Surgical History  Procedure Laterality Date  . Cholecystectomy    . Appendectomy    . Abdominal hysterectomy    . Breast surgery    . Colonoscopy N/A 07/03/2014    treated/removed as above   Family History  Problem Relation Age of Onset  . Alcohol abuse Mother   . Early death Mother   . Early death Father   . Alcohol abuse Sister   . HIV Brother   . Diabetes Brother   . Heart disease Brother   . Hepatitis C Daughter   . Hypertension Son   . Cancer Maternal Aunt   . Hypertension Maternal Aunt   . Heart disease Maternal Aunt   . Cancer Maternal Uncle   . Hypertension Maternal Uncle   . Heart disease Brother   . Diabetes Brother    History  Substance Use Topics  . Smoking  status: Current Every Day Smoker -- 0.50 packs/day for 50 years  . Smokeless tobacco: Never Used     Comment: one pack every 2-3 days  . Alcohol Use: No   OB History    Gravida Para Term Preterm AB TAB SAB Ectopic Multiple Living   4 2 2  2  2   2      Review of Systems  Constitutional: Positive for appetite change.  Respiratory: Positive for cough and shortness of breath.   Gastrointestinal: Positive for nausea, vomiting, abdominal pain and diarrhea.  Genitourinary:       Urinary incontinence   All other systems reviewed and are negative.     Allergies  Darvocet; Latex; Other; Penicillins; Tetracyclines & related; and Adhesive  Home Medications   Prior to Admission medications   Medication Sig Start Date End Date Taking? Authorizing Provider  albuterol (PROVENTIL HFA;VENTOLIN HFA) 108 (90 BASE) MCG/ACT inhaler Inhale 2 puffs into the lungs every 6 (six) hours as needed. Shortness of breath    Yes Historical Provider, MD  albuterol (PROVENTIL) (2.5 MG/3ML) 0.083% nebulizer solution Take 2.5 mg by nebulization 3 (three) times daily as needed for shortness of breath. For shortness of breath  Yes Historical Provider, MD  aspirin EC 81 MG tablet Take 81 mg by mouth daily.     Yes Historical Provider, MD  beclomethasone (QVAR) 40 MCG/ACT inhaler Inhale 1 puff into the lungs 2 (two) times daily as needed (shortness of breath).    Yes Historical Provider, MD  benzonatate (TESSALON) 100 MG capsule Take 200 mg by mouth 3 (three) times daily as needed for cough.   Yes Historical Provider, MD  furosemide (LASIX) 40 MG tablet Take 40 mg by mouth daily.     Yes Historical Provider, MD  ibuprofen (ADVIL,MOTRIN) 800 MG tablet Take 800 mg by mouth every 8 (eight) hours as needed for mild pain.    Yes Historical Provider, MD  latanoprost (XALATAN) 0.005 % ophthalmic solution Place 1 drop into the right eye at bedtime.   Yes Historical Provider, MD  losartan-hydrochlorothiazide (HYZAAR) 100-25 MG  per tablet Take 1 tablet by mouth daily.   Yes Historical Provider, MD  Omega-3 Fatty Acids (OMEGA-3 FISH OIL) 1200 MG CAPS Take 1,200 mg by mouth every morning.   Yes Historical Provider, MD  omeprazole (PRILOSEC) 40 MG capsule Take 40 mg by mouth daily.    Yes Historical Provider, MD  PARoxetine (PAXIL) 20 MG tablet Take 20 mg by mouth 2 (two) times daily.   Yes Historical Provider, MD  potassium chloride (K-DUR) 10 MEQ tablet Take 10 mEq by mouth daily.   Yes Historical Provider, MD  verapamil (VERELAN PM) 180 MG 24 hr capsule Take 1 capsule (180 mg total) by mouth at bedtime. 11/20/14  Yes Pixie Casino, MD  zolpidem (AMBIEN) 10 MG tablet Take 1 tablet (10 mg total) by mouth at bedtime as needed for sleep. 11/27/14 12/27/14 Yes Pixie Casino, MD  dextromethorphan-guaiFENesin [facility] DM) 30-600 MG per 12 hr tablet Take 1 tablet by mouth every 12 (twelve) hours. Patient not taking: Reported on 12/09/2014 05/23/13   Fredia Sorrow, MD  ergocalciferol (VITAMIN D2) 50000 UNITS capsule Take 50,000 Units by mouth once a week. sunday    Historical Provider, MD  fluticasone (FLONASE) 50 MCG/ACT nasal spray Place 2 sprays into the nose daily as needed for allergies.     Historical Provider, MD  loratadine (CLARITIN) 10 MG tablet Take 10 mg by mouth daily.     Historical Provider, MD   BP 141/71 mmHg  Pulse 58  Temp(Src) 99.1 F (37.3 C) (Oral)  Resp 18  Ht 5\' 5"  (1.651 m)  Wt 257 lb (116.574 kg)  BMI 42.77 kg/m2  SpO2 94% Physical Exam  Constitutional: She is oriented to person, place, and time. She appears well-developed and well-nourished. No distress.  HENT:  Head: Normocephalic and atraumatic.  Eyes: EOM are normal.  Neck: Normal range of motion.  Cardiovascular: Normal rate, regular rhythm and normal heart sounds.   Pulmonary/Chest: Effort normal and breath sounds normal.  Lungs clear  Abdominal: Soft. She exhibits no distension. There is no tenderness.  Musculoskeletal: Normal range of  motion.  Neurological: She is alert and oriented to person, place, and time.  Skin: Skin is warm and dry.  Psychiatric: She has a normal mood and affect. Judgment normal.  Nursing note and vitals reviewed.   ED Course  Procedures (including critical care time) DIAGNOSTIC STUDIES: Oxygen Saturation is 94% on RA, adequate by my interpretation.    COORDINATION OF CARE: 11:52 AM- Pt advised of plan for treatment and pt agrees.    Labs Review Labs Reviewed  CBC WITH DIFFERENTIAL/PLATELET - Abnormal;  Notable for the following:    Hemoglobin 11.8 (*)    All other components within normal limits  COMPREHENSIVE METABOLIC PANEL - Abnormal; Notable for the following:    Potassium 3.2 (*)    Total Protein 8.9 (*)    Albumin 3.3 (*)    AST 69 (*)    ALT 58 (*)    Alkaline Phosphatase 133 (*)    GFR calc non Af Amer 87 (*)    All other components within normal limits  URINALYSIS, ROUTINE W REFLEX MICROSCOPIC - Abnormal; Notable for the following:    Protein, ur 30 (*)    Urobilinogen, UA 2.0 (*)    Leukocytes, UA TRACE (*)    All other components within normal limits  URINE MICROSCOPIC-ADD ON - Abnormal; Notable for the following:    Squamous Epithelial / LPF FEW (*)    Bacteria, UA MANY (*)    All other components within normal limits  URINE CULTURE  LIPASE, BLOOD  CK    Imaging Review Dg Abd Acute W/chest  12/09/2014   CLINICAL DATA:  Abdominal pain. Cough. Nausea. Vomiting. Weakness.  EXAM: DG ABDOMEN ACUTE W/ 1V CHEST  COMPARISON:  Chest radiograph 12/12/2013.  FINDINGS: There is no evidence of dilated bowel loops or free intraperitoneal air. No radiopaque calculi or other significant radiographic abnormality is seen. Heart size and mediastinal contours are within normal limits. Both lungs are clear. Cholecystectomy clips are present in the right upper quadrant.  IMPRESSION: Negative abdominal radiographs.  No acute cardiopulmonary disease.   Electronically Signed   By: Dereck Ligas M.D.   On: 12/09/2014 13:48  I personally reviewed the imaging tests through PACS system I reviewed available ER/hospitalization records through the EMR    EKG Interpretation   Date/Time:  Tuesday December 09 2014 12:49:39 EDT Ventricular Rate:  55 PR Interval:  99 QRS Duration: 99 QT Interval:  476 QTC Calculation: 455 R Axis:   -37 Text Interpretation:  Sinus or ectopic atrial rhythm Short PR interval  Left axis deviation No significant change was found Confirmed by Mckinley Adelstein   MD, Siriyah Ambrosius ([id number]) on 12/09/2014 3:21:12 PM      MDM   Final diagnoses:  Urinary tract infection without hematuria, site unspecified  Nausea vomiting and diarrhea   Patient has not vomited in the emergency department.  She's keeping oral fluids down.  Her abdominal exam is benign.  Vital signs are normal.  Patient will be given Keflex for apparent urinary tract infection.  Will send home with antibiotics and nausea medication.  She is nontoxic appearing.  Primary care follow-up.  She understands to return to the ER for new or worsening symptoms.   I personally performed the services described in this documentation, which was scribed in my presence. The recorded information has been reviewed and is accurate.      Jola Schmidt, MD 12/09/14 [phone]

## 2014-12-09 NOTE — Discharge Instructions (Signed)

## 2014-12-09 NOTE — ED Notes (Signed)
Went into assess pt's pain level. Pt reported "sudden chest pain" in mid chest rating 8/10. Pt stated, "I just feel funny". Pt placed on cardiac monitor. EKG performed. EKG given to Dr. Venora Maples and made him aware of pt's complaints.

## 2014-12-10 ENCOUNTER — Telehealth: Payer: Self-pay | Admitting: Internal Medicine

## 2014-12-10 ENCOUNTER — Ambulatory Visit: Payer: Commercial Managed Care - HMO | Admitting: Nurse Practitioner

## 2014-12-10 ENCOUNTER — Encounter: Payer: Self-pay | Admitting: Nurse Practitioner

## 2014-12-10 NOTE — Telephone Encounter (Signed)
PATIENT WAS A NO SHOW 12/10/14 AND LETTER WAS SENT

## 2014-12-11 DIAGNOSIS — K116 Mucocele of salivary gland: Secondary | ICD-10-CM | POA: Diagnosis not present

## 2014-12-11 DIAGNOSIS — R220 Localized swelling, mass and lump, head: Secondary | ICD-10-CM | POA: Diagnosis not present

## 2014-12-11 DIAGNOSIS — G4733 Obstructive sleep apnea (adult) (pediatric): Secondary | ICD-10-CM | POA: Diagnosis not present

## 2014-12-13 LAB — URINE CULTURE: Colony Count: >=100000

## 2014-12-14 ENCOUNTER — Telehealth: Payer: Self-pay | Admitting: Emergency Medicine

## 2014-12-14 NOTE — Progress Notes (Signed)
ED Antimicrobial Stewardship Positive Culture Follow Up   Megan Odom is an 69 y.o. female who presented to [facility] on 12/09/2014 with a chief complaint of  Chief Complaint  Patient presents with  . Abdominal Pain    Recent Results (from the past 720 hour(s))  Urine culture     Status: None   Collection Time: 12/09/14 12:00 PM  Result Value Ref Range Status   Specimen Description URINE, CATHETERIZED  Final   Special Requests NONE  Final   Colony Count   Final    >=100,000 COLONIES/ML Performed at [facility]    Culture   Final    ENTEROBACTER AEROGENES Performed at [facility]    Report Status 12/13/2014 FINAL  Final   Organism ID, Bacteria ENTEROBACTER AEROGENES  Final      Susceptibility   Enterobacter aerogenes - MIC*    CEFAZOLIN RESISTANT      CEFTRIAXONE <=1 SENSITIVE Sensitive     CIPROFLOXACIN <=0.25 SENSITIVE Sensitive     GENTAMICIN <=1 SENSITIVE Sensitive     LEVOFLOXACIN <=0.12 SENSITIVE Sensitive     NITROFURANTOIN 64 INTERMEDIATE Intermediate     TOBRAMYCIN <=1 SENSITIVE Sensitive     TRIMETH/SULFA <=20 SENSITIVE Sensitive     PIP/TAZO <=4 SENSITIVE Sensitive     * ENTEROBACTER AEROGENES    [x]  Treated with cephalexin, organism resistant to prescribed antimicrobial []  Patient discharged originally without antimicrobial agent and treatment is now indicated  New antibiotic prescription: ciprofloxacin 500mg  po bid x 3 days  ED Provider: Dahlia Bailiff, PA-C   Candie Mile 12/14/2014, 9:33 AM Infectious Diseases Pharmacist Phone# [phone]

## 2014-12-15 ENCOUNTER — Telehealth (HOSPITAL_BASED_OUTPATIENT_CLINIC_OR_DEPARTMENT_OTHER): Payer: Self-pay | Admitting: Emergency Medicine

## 2014-12-16 ENCOUNTER — Telehealth (HOSPITAL_COMMUNITY): Payer: Self-pay | Admitting: *Deleted

## 2014-12-17 DIAGNOSIS — K118 Other diseases of salivary glands: Secondary | ICD-10-CM | POA: Diagnosis not present

## 2014-12-19 ENCOUNTER — Ambulatory Visit: Payer: Commercial Managed Care - HMO | Admitting: Cardiovascular Disease

## 2014-12-22 DIAGNOSIS — Z79899 Other long term (current) drug therapy: Secondary | ICD-10-CM | POA: Diagnosis not present

## 2014-12-23 ENCOUNTER — Telehealth: Payer: Self-pay | Admitting: Internal Medicine

## 2014-12-23 ENCOUNTER — Other Ambulatory Visit: Payer: Self-pay | Admitting: Internal Medicine

## 2014-12-23 DIAGNOSIS — R519 Headache, unspecified: Secondary | ICD-10-CM

## 2014-12-23 DIAGNOSIS — R51 Headache: Principal | ICD-10-CM

## 2014-12-23 DIAGNOSIS — R42 Dizziness and giddiness: Secondary | ICD-10-CM

## 2014-12-23 LAB — CREATININE, SERUM: Creat: 0.79 mg/dL (ref 0.50–1.10)

## 2014-12-23 LAB — BUN: BUN: 12 mg/dL (ref 6–23)

## 2014-12-23 NOTE — Telephone Encounter (Signed)
Received a call from [place] with [name].He stated insurance will only approve MRI of brain without contrast.Stated order reads with and without contrast.Stated will need a new order for MRI without contrast of brain.Message sent to Dr.Hilty.

## 2014-12-23 NOTE — Telephone Encounter (Signed)
Ok .. I ordered it without contrast.  Thanks.  Dr. Lemmie Evens

## 2014-12-23 NOTE — Telephone Encounter (Signed)
Spoke to Santiago Glad at [facility] radiology Dr.Hilty ordered MRI of brain without contrast.Order re entered.

## 2014-12-24 ENCOUNTER — Ambulatory Visit (HOSPITAL_COMMUNITY): Admission: RE | Admit: 2014-12-24 | Payer: Commercial Managed Care - HMO | Source: Ambulatory Visit

## 2014-12-24 ENCOUNTER — Ambulatory Visit (HOSPITAL_COMMUNITY)
Admission: RE | Admit: 2014-12-24 | Discharge: 2014-12-24 | Disposition: A | Discharge status: Home / Self Care | Payer: Commercial Managed Care - HMO | Source: Ambulatory Visit | Attending: Internal Medicine | Admitting: Internal Medicine

## 2014-12-24 DIAGNOSIS — R937 Abnormal findings on diagnostic imaging of other parts of musculoskeletal system: Secondary | ICD-10-CM | POA: Insufficient documentation

## 2014-12-24 DIAGNOSIS — R42 Dizziness and giddiness: Secondary | ICD-10-CM | POA: Insufficient documentation

## 2014-12-24 DIAGNOSIS — R519 Headache, unspecified: Secondary | ICD-10-CM

## 2014-12-24 DIAGNOSIS — C07 Malignant neoplasm of parotid gland: Secondary | ICD-10-CM | POA: Diagnosis not present

## 2014-12-24 DIAGNOSIS — D37030 Neoplasm of uncertain behavior of the parotid salivary glands: Secondary | ICD-10-CM | POA: Insufficient documentation

## 2014-12-24 DIAGNOSIS — R55 Syncope and collapse: Secondary | ICD-10-CM | POA: Insufficient documentation

## 2014-12-24 DIAGNOSIS — R51 Headache: Secondary | ICD-10-CM | POA: Diagnosis not present

## 2014-12-25 ENCOUNTER — Telehealth: Payer: Self-pay | Admitting: Internal Medicine

## 2014-12-25 DIAGNOSIS — R55 Syncope and collapse: Secondary | ICD-10-CM

## 2014-12-25 DIAGNOSIS — R2689 Other abnormalities of gait and mobility: Secondary | ICD-10-CM

## 2014-12-25 DIAGNOSIS — R42 Dizziness and giddiness: Secondary | ICD-10-CM

## 2014-12-25 DIAGNOSIS — G9520 Unspecified cord compression: Secondary | ICD-10-CM

## 2014-12-25 DIAGNOSIS — R519 Headache, unspecified: Secondary | ICD-10-CM

## 2014-12-25 DIAGNOSIS — R51 Headache: Secondary | ICD-10-CM

## 2014-12-25 NOTE — Telephone Encounter (Signed)
Py wants to know if her MRI results are back from yesterday. If not at [facility] call-[phone].

## 2014-12-25 NOTE — Telephone Encounter (Signed)
Result note pending from Dr. Okey Regal

## 2014-12-26 NOTE — Telephone Encounter (Signed)
Patient will be notified of results when MD reviews and makes notation regarding test.

## 2014-12-30 NOTE — Addendum Note (Signed)
Addended by: Fidel Levy on: 12/30/2014 09:55 AM   Modules accepted: Orders

## 2014-12-30 NOTE — Telephone Encounter (Signed)
Patient notified of results. Referral order placed. Patient given information regarding [place] office location and phone number.   Staff message sent to scheduling pool to notify of urgent referral.

## 2015-01-08 ENCOUNTER — Ambulatory Visit (HOSPITAL_COMMUNITY)
Admission: RE | Admit: 2015-01-08 | Discharge: 2015-01-08 | Disposition: A | Discharge status: Home / Self Care | Payer: Commercial Managed Care - HMO | Source: Ambulatory Visit | Attending: Internal Medicine | Admitting: Internal Medicine

## 2015-01-08 ENCOUNTER — Other Ambulatory Visit (HOSPITAL_COMMUNITY): Payer: Self-pay | Admitting: Internal Medicine

## 2015-01-08 DIAGNOSIS — M25511 Pain in right shoulder: Secondary | ICD-10-CM

## 2015-01-08 DIAGNOSIS — M19011 Primary osteoarthritis, right shoulder: Secondary | ICD-10-CM | POA: Diagnosis not present

## 2015-01-17 DIAGNOSIS — M25511 Pain in right shoulder: Secondary | ICD-10-CM | POA: Diagnosis not present

## 2015-01-17 DIAGNOSIS — M7501 Adhesive capsulitis of right shoulder: Secondary | ICD-10-CM | POA: Diagnosis not present

## 2015-01-20 DIAGNOSIS — Z6841 Body Mass Index (BMI) 40.0 and over, adult: Secondary | ICD-10-CM | POA: Diagnosis not present

## 2015-01-20 DIAGNOSIS — G959 Disease of spinal cord, unspecified: Secondary | ICD-10-CM | POA: Diagnosis not present

## 2015-01-20 DIAGNOSIS — I10 Essential (primary) hypertension: Secondary | ICD-10-CM | POA: Diagnosis not present

## 2015-01-20 DIAGNOSIS — M25511 Pain in right shoulder: Secondary | ICD-10-CM | POA: Diagnosis not present

## 2015-01-21 DIAGNOSIS — F17200 Nicotine dependence, unspecified, uncomplicated: Secondary | ICD-10-CM | POA: Diagnosis not present

## 2015-01-21 DIAGNOSIS — Z881 Allergy status to other antibiotic agents status: Secondary | ICD-10-CM | POA: Diagnosis not present

## 2015-01-21 DIAGNOSIS — M25511 Pain in right shoulder: Secondary | ICD-10-CM | POA: Diagnosis not present

## 2015-01-21 DIAGNOSIS — I10 Essential (primary) hypertension: Secondary | ICD-10-CM | POA: Diagnosis not present

## 2015-01-21 DIAGNOSIS — J449 Chronic obstructive pulmonary disease, unspecified: Secondary | ICD-10-CM | POA: Diagnosis not present

## 2015-01-21 DIAGNOSIS — Z91048 Other nonmedicinal substance allergy status: Secondary | ICD-10-CM | POA: Diagnosis not present

## 2015-01-21 DIAGNOSIS — M7501 Adhesive capsulitis of right shoulder: Secondary | ICD-10-CM | POA: Diagnosis not present

## 2015-01-21 DIAGNOSIS — Z880 Allergy status to penicillin: Secondary | ICD-10-CM | POA: Diagnosis not present

## 2015-01-21 DIAGNOSIS — M19011 Primary osteoarthritis, right shoulder: Secondary | ICD-10-CM | POA: Diagnosis not present

## 2015-01-21 DIAGNOSIS — M1990 Unspecified osteoarthritis, unspecified site: Secondary | ICD-10-CM | POA: Diagnosis not present

## 2015-01-21 DIAGNOSIS — Z888 Allergy status to other drugs, medicaments and biological substances status: Secondary | ICD-10-CM | POA: Diagnosis not present

## 2015-01-27 ENCOUNTER — Ambulatory Visit (HOSPITAL_COMMUNITY): Payer: Commercial Managed Care - HMO | Attending: Family Medicine

## 2015-01-27 ENCOUNTER — Encounter (HOSPITAL_COMMUNITY): Payer: Self-pay

## 2015-01-27 ENCOUNTER — Telehealth: Payer: Self-pay | Admitting: Internal Medicine

## 2015-01-27 DIAGNOSIS — R29898 Other symptoms and signs involving the musculoskeletal system: Secondary | ICD-10-CM

## 2015-01-27 DIAGNOSIS — M25611 Stiffness of right shoulder, not elsewhere classified: Secondary | ICD-10-CM | POA: Diagnosis not present

## 2015-01-27 DIAGNOSIS — M25511 Pain in right shoulder: Secondary | ICD-10-CM | POA: Insufficient documentation

## 2015-01-27 DIAGNOSIS — R531 Weakness: Secondary | ICD-10-CM | POA: Insufficient documentation

## 2015-01-27 DIAGNOSIS — M629 Disorder of muscle, unspecified: Secondary | ICD-10-CM | POA: Insufficient documentation

## 2015-01-27 DIAGNOSIS — M6289 Other specified disorders of muscle: Secondary | ICD-10-CM

## 2015-01-27 NOTE — Therapy (Signed)
[place], [place], [address] Phone: [phone]   Fax:  [phone]  Occupational Therapy Evaluation  Patient Details  Name: Megan Odom MRN: [id number] Date of Birth: [birth year] Referring Provider:  Marilynne Drivers, FNP  Encounter Date: 01/27/2015      OT End of Session - 01/27/15 1444    Visit Number 1   Number of Visits 12   Date for OT Re-Evaluation 03/28/15  mini reassessment: 02/24/15   Authorization Type [employer] (HMO)   Authorization Time Period before 10th visit   Authorization - Visit Number 1   Authorization - Number of Visits 10   OT Start Time 1309   OT Stop Time 1350   OT Time Calculation (min) 41 min   Activity Tolerance Patient tolerated treatment well   Behavior During Therapy [facility] for tasks assessed/performed      Past Medical History  Diagnosis Date  . Hypertension   . Asthma   . Arthritis   . Hep C w/o coma, chronic   . Type 2 diabetes mellitus   . Hyperlipemia   . Depression     Past Surgical History  Procedure Laterality Date  . Cholecystectomy    . Appendectomy    . Abdominal hysterectomy    . Breast surgery    . Colonoscopy N/A 07/03/2014    treated/removed as above    There were no vitals filed for this visit.  Visit Diagnosis:  Pain in joint, shoulder region, right - Plan: Ot plan of care cert/re-cert  Tight fascia - Plan: Ot plan of care cert/re-cert  Stiffness of joint, shoulder region, right - Plan: Ot plan of care cert/re-cert  Weakness of right upper extremity - Plan: Ot plan of care cert/re-cert      Subjective Assessment - 01/27/15 1313    Subjective  S: I've had this pain for 3 years but recently it started to get worse and worse.    Pertinent History Patient is a 69 y/o female s/p right shoulder pain which started about 3 years ago. Patient reports that pain has recently become worse which has caused her to seek out help. No report of  injury. X-ray completed in May 2016 with the results of Osteoarthritis in the [facility] joint of right shoulder. Dr. Doy Mince has referred patient to occupational therapy for evaluation and treatment.    Special Tests FOTO score: 52/100   Patient Stated Goals To decrease pain.   Currently in Pain? Yes   Pain Score 6    Pain Location Shoulder   Pain Orientation Right   Pain Descriptors / Indicators Aching   Pain Type Chronic pain           [facility] OT Assessment - 01/27/15 1314    Assessment   Diagnosis Right shoulder pain   Onset Date --  3 years   Assessment in 6 weeks - [place]   Prior Therapy None   Precautions   Precautions None   Restrictions   Weight Bearing Restrictions No   Balance Screen   Has the patient fallen in the past 6 months No   Has the patient had a decrease in activity level because of a fear of falling?  No   Is the patient reluctant to leave their home because of a fear of falling?  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 Sometimes driving.    Mobility   Mobility Status Independent   Written Expression   Dominant Hand Right   Vision - History   Baseline Vision No visual deficits   Cognition   Overall Cognitive Status Within Functional Limits for tasks assessed   ROM / Strength   AROM / PROM / Strength AROM;PROM;Strength   Palpation   Palpation comment Mod fascial restrictions in right deltoid region.    AROM   Overall AROM Comments Assessed seated. IR/ER adducted   AROM Assessment Site Shoulder   Right/Left Shoulder Right   Right Shoulder Flexion 155 Degrees   Right Shoulder ABduction 155 Degrees   Right Shoulder Internal Rotation 90  Degrees   Right Shoulder External Rotation 50 Degrees   PROM   Overall PROM  Within functional limits for tasks performed   PROM Assessment Site Shoulder   Right/Left Shoulder Right   Strength   Overall Strength Comments Assessed seated. IR/ER adducted.   Strength Assessment Site Shoulder   Right/Left Shoulder Right   Right Shoulder Flexion 3+/5   Right Shoulder ABduction 4-/5   Right Shoulder Internal Rotation 4-/5   Right Shoulder External Rotation 4/5                         OT Education - 01/27/15 1444    Education provided Yes   Education Details shoulder stretches   Person(s) Educated Patient   Methods Explanation;Demonstration;Handout   Comprehension Verbalized understanding;Returned demonstration          OT Short Term Goals - 01/27/15 1447    OT SHORT TERM GOAL #1   Title Patient will be educated and instructed on HEP.    Time 3   Period Weeks   Status New   OT SHORT TERM GOAL #2   Title Patient will increase AROM to WNL to increase ability to complete overhead tasks with less difficulty.    Time 3   Period Weeks   Status New   OT SHORT TERM GOAL #3   Title Patient will increase RUE strength to 4/5 to increase ability to complete work related tasks.    Time 3   Period Weeks   Status New   OT SHORT TERM GOAL #4   Title Patient will decrease fascial restrctions to min amount.    Time 3   Period Weeks   Status New   OT SHORT TERM GOAL #5   Title Patient will increase pain level in RUE to 4/10 during daily tasks.    Time 3   Period Weeks   Status New           OT Long Term Goals - 01/27/15 1452    OT LONG TERM GOAL #1   Title Patient will return to highest level of independence with daily and work related tasks.    Time 6   Period Weeks   Status New   OT LONG TERM GOAL #2   Title Patient will increase RUE strenght to 4+/5 to increase ability to complete household chores.    Time 6   Period Weeks   Status New   OT LONG TERM  GOAL #3   Title Patient will decrease fascial restrictions to zero.   Time 6  Period Weeks   Status New   OT LONG TERM GOAL #4   Title Patient will decrease pain to 2/10 or less when completing daily tasks.    Time 6   Period Weeks   Status New               Plan - February 13, 2015 1445    Clinical Impression Statement A: Patient is a 69 y/o female s/p right shoulder pain causing increased pain and fascial restrctions and decreased strength and ROM resulting in difficulty completing daily and work related tasks.    Pt will benefit from skilled therapeutic intervention in order to improve on the following deficits (Retired) Decreased range of motion;Pain;Impaired UE functional use;Decreased strength;Increased fascial restricitons   Rehab Potential Good   OT Frequency 2x / week   OT Duration 6 weeks   OT Treatment/Interventions Self-care/ADL training;Patient/family education;Therapeutic exercise;Manual Therapy;Neuromuscular education;Therapeutic activities;Cryotherapy;Electrical Stimulation;Moist Heat;Passive range of motion   Plan P: Patient will benefit from skilled OT services to increase functional use of right UE during daily and work related tasks. Treatment Plan: Myofascial release, manual stretching, PROM, AROM, and general strengthening.    Consulted and Agree with Plan of Care Patient          G-Codes - 02/13/15 1455    Functional Assessment Tool Used FOTO score: 52/100 (48% impaired)   Functional Limitation Carrying, moving and handling objects   Carrying, Moving and Handling Objects Current Status (B1517) At least 40 percent but less than 60 percent impaired, limited or restricted   Carrying, Moving and Handling Objects Goal Status (O1607) At least 1 percent but less than 20 percent impaired, limited or restricted      Problem List Patient Active Problem List   Diagnosis Date Noted  . Headache 11/20/2014  . Dizziness 11/20/2014  . Hepatic cirrhosis 11/05/2014  .  Chest pain 11/05/2014  . Dysphagia 11/05/2014  . History of hepatitis C 11/05/2014  . Sleep apnea, obstructive 01/17/2013  . Paroxysmal atrial tachycardia 01/17/2013  . HTN (hypertension) 01/17/2013  . Morbid obesity 01/17/2013  . Chest pain at rest 01/17/2013  . Difficulty in walking(719.7) 02/17/2012  . Bilateral leg weakness 02/17/2012  . Back pain 02/16/2012  . Bilateral anterior knee pain 02/16/2012  . Knee pain, left 02/16/2012    Ailene Ravel, OTR/L,CBIS  [phone]  02/13/2015, 2:57 PM  [place] [address] [place], [place], [address] Phone: [phone]   Fax:  [phone]

## 2015-01-27 NOTE — Patient Instructions (Signed)
Osteoarthritis Osteoarthritis is a disease that causes soreness and inflammation of a joint. It occurs when the cartilage at the affected joint wears down. Cartilage acts as a cushion, covering the ends of bones where they meet to form a joint. Osteoarthritis is the most common form of arthritis. It often occurs in older people. The joints affected most often by this condition include those in the:  Ends of the fingers.  Thumbs.  Neck.  Lower back.  Knees.  Hips. CAUSES  Over time, the cartilage that covers the ends of bones begins to wear away. This causes bone to rub on bone, producing pain and stiffness in the affected joints.  RISK FACTORS Certain factors can increase your chances of having osteoarthritis, including:  Older age.  Excessive body weight.  Overuse of joints.  Previous joint injury. SIGNS AND SYMPTOMS   Pain, swelling, and stiffness in the joint.  Over time, the joint may lose its normal shape.  Small deposits of bone (osteophytes) may grow on the edges of the joint.  Bits of bone or cartilage can break off and float inside the joint space. This may cause more pain and damage. DIAGNOSIS  Your health care provider will do a physical exam and ask about your symptoms. Various tests may be ordered, such as:  X-rays of the affected joint.  An MRI scan.  Blood tests to rule out other types of arthritis.  Joint fluid tests. This involves using a needle to draw fluid from the joint and examining the fluid under a microscope. TREATMENT  Goals of treatment are to control pain and improve joint function. Treatment plans may include:  A prescribed exercise program that allows for rest and joint relief.  A weight control plan.  Pain relief techniques, such as:  Properly applied heat and cold.  Electric pulses delivered to nerve endings under the skin (transcutaneous electrical nerve stimulation [TENS]).  Massage.  Certain nutritional  supplements.  Medicines to control pain, such as:  Acetaminophen.  Nonsteroidal anti-inflammatory drugs (NSAIDs), such as naproxen.  Narcotic or central-acting agents, such as tramadol.  Corticosteroids. These can be given orally or as an injection.  Surgery to reposition the bones and relieve pain (osteotomy) or to remove loose pieces of bone and cartilage. Joint replacement may be needed in advanced states of osteoarthritis. HOME CARE INSTRUCTIONS   Take medicines only as directed by your health care provider.  Maintain a healthy weight. Follow your health care provider's instructions for weight control. This may include dietary instructions.  Exercise as directed. Your health care provider can recommend specific types of exercise. These may include:  Strengthening exercises. These are done to strengthen the muscles that support joints affected by arthritis. They can be performed with weights or with exercise bands to add resistance.  Aerobic activities. These are exercises, such as brisk walking or low-impact aerobics, that get your heart pumping.  Range-of-motion activities. These keep your joints limber.  Balance and agility exercises. These help you maintain daily living skills.  Rest your affected joints as directed by your health care provider.  Keep all follow-up visits as directed by your health care provider. SEEK MEDICAL CARE IF:   Your skin turns red.  You develop a rash in addition to your joint pain.  You have worsening joint pain.  You have a fever along with joint or muscle aches. SEEK IMMEDIATE MEDICAL CARE IF:  You have a significant loss of weight or appetite.  You have night sweats. FOR MORE  [place] of Arthritis and Musculoskeletal and Skin Diseases: www.niams.SouthExposed.es  [employer] on Aging: http://kim-miller.com/  American College of Rheumatology: www.rheumatology.org Document Released: 08/08/2005 Document Revised:  12/23/2013 Document Reviewed: 04/15/2013 [facility] Patient Information 2015 [place], [place]. This information is not intended to replace advice given to you by your health care provider. Make sure you discuss any questions you have with your health care provider.    Flexibility: Corner Stretch   Standing in corner with hands just above shoulder level and feet _24___ inches from corner, lean forward until a comfortable stretch is felt across chest. Hold __10__ seconds. Repeat __3__ times per set. Do __1__ sets per session. Do _2-3___ sessions per day.  http://orth.exer.us/342   Copyright  VHI. All rights reserved.   Scapular Retraction (Standing)   With arms at sides, pinch shoulder blades together. Repeat __10__ times per set. Do __1__ sets per session. Do _2-3___ sessions per day.  http://orth.exer.us/944   Copyright  VHI. All rights reserved.   ROM: Towel Stretch - with Interior Rotation   Pull left arm up behind back by pulling towel up with other arm.  Repeat _10___ times per set. Do _1___ sets per session. Do __2-3__ sessions per day.  http://orth.exer.us/888   Copyright  VHI. All rights reserved.   Posterior Capsule Stretch   Stand or sit, one arm across body so hand rests over opposite shoulder. Gently push on crossed elbow with other hand until stretch is felt in shoulder of crossed arm. Hold _10__ seconds.  Repeat _3__ times per session. Do _2-3__ sessions per day.  Copyright  VHI. All rights reserved.   Flexors Stretch, Standing   Stand near wall and slide arm up, with palm facing away from wall, by leaning toward wall. Hold _10__ seconds.  Repeat __3_ times per session. Do _2-3__ sessions per day.  Copyright  VHI. All rights reserved.

## 2015-01-27 NOTE — Telephone Encounter (Signed)
Received records from [facility] for appointment on 01/28/15 with Dr Debara Pickett.  Records given to [facility] (medical records) for Dr Lysbeth Penner schedule on 01/28/15.  lp

## 2015-01-28 ENCOUNTER — Ambulatory Visit: Payer: Commercial Managed Care - HMO | Admitting: Internal Medicine

## 2015-02-02 ENCOUNTER — Ambulatory Visit (HOSPITAL_COMMUNITY): Payer: Commercial Managed Care - HMO

## 2015-02-03 ENCOUNTER — Ambulatory Visit (HOSPITAL_BASED_OUTPATIENT_CLINIC_OR_DEPARTMENT_OTHER): Payer: Commercial Managed Care - HMO | Attending: Internal Medicine

## 2015-02-04 ENCOUNTER — Ambulatory Visit (HOSPITAL_COMMUNITY): Payer: Commercial Managed Care - HMO | Admitting: Specialist

## 2015-02-05 DIAGNOSIS — G959 Disease of spinal cord, unspecified: Secondary | ICD-10-CM | POA: Diagnosis not present

## 2015-02-05 DIAGNOSIS — M5022 Other cervical disc displacement, mid-cervical region: Secondary | ICD-10-CM | POA: Diagnosis not present

## 2015-02-05 DIAGNOSIS — M9971 Connective tissue and disc stenosis of intervertebral foramina of cervical region: Secondary | ICD-10-CM | POA: Diagnosis not present

## 2015-02-06 ENCOUNTER — Encounter (HOSPITAL_COMMUNITY): Payer: Self-pay | Admitting: Occupational Therapy

## 2015-02-06 ENCOUNTER — Ambulatory Visit (HOSPITAL_COMMUNITY): Payer: Commercial Managed Care - HMO | Admitting: Occupational Therapy

## 2015-02-06 DIAGNOSIS — M25611 Stiffness of right shoulder, not elsewhere classified: Secondary | ICD-10-CM | POA: Diagnosis not present

## 2015-02-06 DIAGNOSIS — M629 Disorder of muscle, unspecified: Secondary | ICD-10-CM

## 2015-02-06 DIAGNOSIS — M25511 Pain in right shoulder: Secondary | ICD-10-CM | POA: Diagnosis not present

## 2015-02-06 DIAGNOSIS — R29898 Other symptoms and signs involving the musculoskeletal system: Secondary | ICD-10-CM

## 2015-02-06 DIAGNOSIS — R531 Weakness: Secondary | ICD-10-CM | POA: Diagnosis not present

## 2015-02-06 DIAGNOSIS — M6289 Other specified disorders of muscle: Secondary | ICD-10-CM

## 2015-02-06 NOTE — Therapy (Signed)
[place], [place], [address] Phone: [phone]   Fax:  [phone]  Occupational Therapy Treatment  Patient Details  Name: Megan Odom MRN: [id number] Date of Birth: September 01, 1945 Referring Provider:  Marilynne Drivers, FNP  Encounter Date: 02/06/2015      OT End of Session - 02/06/15 1252    Visit Number 2   Number of Visits 12   Date for OT Re-Evaluation 03/28/15  mini reassessment: 02/24/15   Authorization Type [employer] (HMO)   Authorization Time Period before 10th visit   Authorization - Visit Number 2   Authorization - Number of Visits 10   OT Start Time 1015   OT Stop Time 1057   OT Time Calculation (min) 42 min   Activity Tolerance Patient tolerated treatment well   Behavior During Therapy [facility] for tasks assessed/performed      Past Medical History  Diagnosis Date  . Hypertension   . Asthma   . Arthritis   . Hep C w/o coma, chronic   . Type 2 diabetes mellitus   . Hyperlipemia   . Depression     Past Surgical History  Procedure Laterality Date  . Cholecystectomy    . Appendectomy    . Abdominal hysterectomy    . Breast surgery    . Colonoscopy N/A 07/03/2014    treated/removed as above    There were no vitals filed for this visit.  Visit Diagnosis:  Pain in joint, shoulder region, right  Tight fascia  Stiffness of joint, shoulder region, right  Weakness of right upper extremity      Subjective Assessment - 02/06/15 1018    Subjective  S: It was really giving me a fit this morning.    Currently in Pain? Yes   Pain Score 5    Pain Location Shoulder   Pain Orientation Right   Pain Descriptors / Indicators Aching   Pain Type Chronic pain            [facility] OT Assessment - 02/06/15 1017    Assessment   Diagnosis Right shoulder pain   Precautions   Precautions None                  OT Treatments/Exercises (OP) - 02/06/15 1019    Exercises   Exercises Shoulder   Shoulder Exercises: Supine   Protraction PROM;5 reps;AROM;10 reps   Horizontal ABduction PROM;5 reps;AROM;10 reps   External Rotation PROM;5 reps;AROM;10 reps   Internal Rotation PROM;5 reps;AROM;10 reps   Flexion PROM;5 reps;AROM;10 reps   ABduction PROM;5 reps;AROM;10 reps   Shoulder Exercises: Seated   Retraction AROM;10 reps   Protraction AROM;10 reps   Horizontal ABduction AROM;10 reps   External Rotation AROM;10 reps   Internal Rotation AROM;10 reps   Flexion AROM;10 reps   Abduction AROM;10 reps   Shoulder Exercises: ROM/Strengthening   Proximal Shoulder Strengthening, Supine 10X no rest break   Proximal Shoulder Strengthening, Seated 10X no rest breaks   Shoulder Exercises: Stretch   Corner Stretch 3 reps;10 seconds   Cross Chest Stretch 3 reps;10 seconds   Wall Stretch - Flexion 3 reps;10 seconds   Manual Therapy   Manual Therapy Myofascial release   Myofascial Release Myofascial release to right upper arm, deltoid, trapezius, and scapularis regions to decrease pain and fascial restrictions and increase joint mobility.                   OT Short  Term Goals - 02/06/15 1256    OT SHORT TERM GOAL #1   Title Patient will be educated and instructed on HEP.    Time 3   Period Weeks   Status On-going   OT SHORT TERM GOAL #2   Title Patient will increase AROM to WNL to increase ability to complete overhead tasks with less difficulty.    Time 3   Period Weeks   Status On-going   OT SHORT TERM GOAL #3   Title Patient will increase RUE strength to 4/5 to increase ability to complete work related tasks.    Time 3   Period Weeks   Status On-going   OT SHORT TERM GOAL #4   Title Patient will decrease fascial restrctions to min amount.    Time 3   Period Weeks   Status On-going   OT SHORT TERM GOAL #5   Title Patient will increase pain level in RUE to 4/10 during daily tasks.    Time 3   Period Weeks   Status On-going           OT  Long Term Goals - 02/06/15 1256    OT LONG TERM GOAL #1   Title Patient will return to highest level of independence with daily and work related tasks.    Time 6   Period Weeks   Status On-going   OT LONG TERM GOAL #2   Title Patient will increase RUE strenght to 4+/5 to increase ability to complete household chores.    Time 6   Period Weeks   Status On-going   OT LONG TERM GOAL #3   Title Patient will decrease fascial restrictions to zero.   Time 6   Period Weeks   Status On-going   OT LONG TERM GOAL #4   Title Patient will decrease pain to 2/10 or less when completing daily tasks.    Time 6   Period Weeks   Status On-going   OT LONG TERM GOAL #5   Status On-going               Plan - 02/06/15 1252    Clinical Impression Statement A: Initiated myofascial release and manual stretching, PROM, AROM, and proximal shoulder strengthening. Reviewed and performed shoulder stretches on HEP. Pt tolerated treatment well. Provided pt with copy of evaluation and reviewed goals.    Plan P:  Add scapular theraband        Problem List Patient Active Problem List   Diagnosis Date Noted  . Headache 11/20/2014  . Dizziness 11/20/2014  . Hepatic cirrhosis 11/05/2014  . Chest pain 11/05/2014  . Dysphagia 11/05/2014  . History of hepatitis C 11/05/2014  . Sleep apnea, obstructive 01/17/2013  . Paroxysmal atrial tachycardia 01/17/2013  . HTN (hypertension) 01/17/2013  . Morbid obesity 01/17/2013  . Chest pain at rest 01/17/2013  . Difficulty in walking(719.7) 02/17/2012  . Bilateral leg weakness 02/17/2012  . Back pain 02/16/2012  . Bilateral anterior knee pain 02/16/2012  . Knee pain, left 02/16/2012    Guadelupe Sabin, OTR/L  [phone]  02/06/2015, 12:56 PM  [place] [address] [place], [place], [address] Phone: [phone]   Fax:  [phone]

## 2015-02-11 ENCOUNTER — Ambulatory Visit (HOSPITAL_COMMUNITY): Payer: Commercial Managed Care - HMO | Admitting: Specialist

## 2015-02-11 DIAGNOSIS — M25611 Stiffness of right shoulder, not elsewhere classified: Secondary | ICD-10-CM

## 2015-02-11 DIAGNOSIS — M629 Disorder of muscle, unspecified: Secondary | ICD-10-CM | POA: Diagnosis not present

## 2015-02-11 DIAGNOSIS — R531 Weakness: Secondary | ICD-10-CM | POA: Diagnosis not present

## 2015-02-11 DIAGNOSIS — M25511 Pain in right shoulder: Secondary | ICD-10-CM

## 2015-02-11 DIAGNOSIS — R29898 Other symptoms and signs involving the musculoskeletal system: Secondary | ICD-10-CM

## 2015-02-11 NOTE — Therapy (Signed)
[place], [place], [address] Phone: [phone]   Fax:  [phone]  Occupational Therapy Treatment  Patient Details  Name: Megan Odom MRN: [id number] Date of Birth: 1945-09-15 Referring Provider:  Rosita Fire, MD  Encounter Date: 02/11/2015      OT End of Session - 02/11/15 1005    Visit Number 3   Number of Visits 12  mini reassess 02/25/15   Date for OT Re-Evaluation 03/28/15   Authorization Type [employer] (HMO)   Authorization Time Period before 10th visit   Authorization - Visit Number 3   Authorization - Number of Visits 10   OT Start Time [phone]   OT Stop Time 1015   OT Time Calculation (min) 36 min   Activity Tolerance Patient tolerated treatment well   Behavior During Therapy [facility] for tasks assessed/performed      Past Medical History  Diagnosis Date  . Hypertension   . Asthma   . Arthritis   . Hep C w/o coma, chronic   . Type 2 diabetes mellitus   . Hyperlipemia   . Depression     Past Surgical History  Procedure Laterality Date  . Cholecystectomy    . Appendectomy    . Abdominal hysterectomy    . Breast surgery    . Colonoscopy N/A 07/03/2014    treated/removed as above    There were no vitals filed for this visit.  Visit Diagnosis:  Pain in joint, shoulder region, right  Stiffness of joint, shoulder region, right  Weakness of right upper extremity      Subjective Assessment - 02/11/15 0939    Subjective  S:  It feels tired more than painful.   Currently in Pain? Yes   Pain Score 4    Pain Location Shoulder   Pain Orientation Right   Pain Descriptors / Indicators Aching   Pain Type Chronic pain            [facility] OT Assessment - 02/11/15 0001    Assessment   Diagnosis Right shoulder pain   Precautions   Precautions None                  OT Treatments/Exercises (OP) - 02/11/15 0001    Exercises   Exercises Shoulder   Shoulder  Exercises: Supine   Protraction PROM;5 reps;AROM;12 reps   Horizontal ABduction PROM;5 reps;AROM;12 reps   External Rotation PROM;5 reps;AROM;12 reps   Internal Rotation PROM;5 reps;AROM;12 reps   Flexion PROM;5 reps;AROM;12 reps   ABduction 5 reps;PROM;AROM;12 reps   Other Supine Exercises serratus anterior punch 10 times AROM   Shoulder Exercises: Seated   Protraction AROM;12 reps   Horizontal ABduction AROM;12 reps   External Rotation AROM;12 reps   Internal Rotation AROM;12 reps   Flexion AROM;12 reps   Abduction AROM;12 reps   Shoulder Exercises: Standing   External Rotation Theraband;10 reps   Theraband Level (Shoulder External Rotation) Level 2 (Red)   Internal Rotation Theraband;10 reps   Theraband Level (Shoulder Internal Rotation) Level 2 (Red)   Extension Theraband;10 reps   Theraband Level (Shoulder Extension) Level 2 (Red)   Row Theraband;10 reps   Theraband Level (Shoulder Row) Level 2 (Red)   Retraction Theraband;10 reps   Theraband Level (Shoulder Retraction) Level 2 (Red)   Shoulder Exercises: ROM/Strengthening   Proximal Shoulder Strengthening, Supine 10X no rest break   Proximal Shoulder Strengthening, Seated 10X no rest breaks   Other ROM/Strengthening Exercises  pinch tree 25 pins from bucket to vertical pole to work on overhead reaching.     Manual Therapy   Manual Therapy Myofascial release   Myofascial Release Myofascial release to right upper arm, deltoid, trapezius, and scapularis regions to decrease pain and fascial restrictions and increase joint mobility.                   OT Short Term Goals - 02/06/15 1256    OT SHORT TERM GOAL #1   Title Patient will be educated and instructed on HEP.    Time 3   Period Weeks   Status On-going   OT SHORT TERM GOAL #2   Title Patient will increase AROM to WNL to increase ability to complete overhead tasks with less difficulty.    Time 3   Period Weeks   Status On-going   OT SHORT TERM GOAL #3    Title Patient will increase RUE strength to 4/5 to increase ability to complete work related tasks.    Time 3   Period Weeks   Status On-going   OT SHORT TERM GOAL #4   Title Patient will decrease fascial restrctions to min amount.    Time 3   Period Weeks   Status On-going   OT SHORT TERM GOAL #5   Title Patient will increase pain level in RUE to 4/10 during daily tasks.    Time 3   Period Weeks   Status On-going           OT Long Term Goals - 02/06/15 1256    OT LONG TERM GOAL #1   Title Patient will return to highest level of independence with daily and work related tasks.    Time 6   Period Weeks   Status On-going   OT LONG TERM GOAL #2   Title Patient will increase RUE strenght to 4+/5 to increase ability to complete household chores.    Time 6   Period Weeks   Status On-going   OT LONG TERM GOAL #3   Title Patient will decrease fascial restrictions to zero.   Time 6   Period Weeks   Status On-going   OT LONG TERM GOAL #4   Title Patient will decrease pain to 2/10 or less when completing daily tasks.    Time 6   Period Weeks   Status On-going   OT LONG TERM GOAL #5   Status On-going               Plan - 02/11/15 1008    Clinical Impression Statement A:  Pateint completed all therapeutic exercises without difficulty and does have complaints of pain.  Patient had most challenge with proximal shoulder strengthening and functional overhead reaching.    Plan P:  Add 1# to supine exercises.  Focus on scapular strengthening and functional reaching exercises.         Problem List Patient Active Problem List   Diagnosis Date Noted  . Headache 11/20/2014  . Dizziness 11/20/2014  . Hepatic cirrhosis 11/05/2014  . Chest pain 11/05/2014  . Dysphagia 11/05/2014  . History of hepatitis C 11/05/2014  . Sleep apnea, obstructive 01/17/2013  . Paroxysmal atrial tachycardia 01/17/2013  . HTN (hypertension) 01/17/2013  . Morbid obesity 01/17/2013  . Chest  pain at rest 01/17/2013  . Difficulty in walking(719.7) 02/17/2012  . Bilateral leg weakness 02/17/2012  . Back pain 02/16/2012  . Bilateral anterior knee pain 02/16/2012  . Knee pain, left 02/16/2012  Vangie Bicker, OTR/L [phone]  02/11/2015, 10:12 AM  [place], [place], [address] Phone: [phone]   Fax:  [phone]

## 2015-02-13 ENCOUNTER — Ambulatory Visit (HOSPITAL_COMMUNITY): Payer: Commercial Managed Care - HMO

## 2015-02-18 ENCOUNTER — Encounter (HOSPITAL_COMMUNITY): Payer: Self-pay

## 2015-02-18 ENCOUNTER — Ambulatory Visit (HOSPITAL_COMMUNITY): Payer: Commercial Managed Care - HMO

## 2015-02-18 DIAGNOSIS — M6289 Other specified disorders of muscle: Secondary | ICD-10-CM

## 2015-02-18 DIAGNOSIS — R29898 Other symptoms and signs involving the musculoskeletal system: Secondary | ICD-10-CM

## 2015-02-18 DIAGNOSIS — M629 Disorder of muscle, unspecified: Secondary | ICD-10-CM

## 2015-02-18 DIAGNOSIS — M25611 Stiffness of right shoulder, not elsewhere classified: Secondary | ICD-10-CM

## 2015-02-18 DIAGNOSIS — R531 Weakness: Secondary | ICD-10-CM | POA: Diagnosis not present

## 2015-02-18 DIAGNOSIS — M25511 Pain in right shoulder: Secondary | ICD-10-CM

## 2015-02-18 NOTE — Therapy (Signed)
[place], [place], [address] Phone: [phone]   Fax:  [phone]  Occupational Therapy Treatment  Patient Details  Name: Megan Odom MRN: [id number] Date of Birth: 05-05-1946 Referring Provider:  Marilynne Drivers, FNP  Encounter Date: 02/18/2015      OT End of Session - 02/18/15 0934    Visit Number 4   Number of Visits 12  mini reassess 02/25/15   Date for OT Re-Evaluation 03/28/15   Authorization Type [employer] (HMO)   Authorization Time Period before 10th visit   Authorization - Visit Number 4   Authorization - Number of Visits 10   OT Start Time [phone]   OT Stop Time 0930   OT Time Calculation (min) 38 min   Activity Tolerance Patient tolerated treatment well   Behavior During Therapy [facility] for tasks assessed/performed      Past Medical History  Diagnosis Date  . Hypertension   . Asthma   . Arthritis   . Hep C w/o coma, chronic   . Type 2 diabetes mellitus   . Hyperlipemia   . Depression     Past Surgical History  Procedure Laterality Date  . Cholecystectomy    . Appendectomy    . Abdominal hysterectomy    . Breast surgery    . Colonoscopy N/A 07/03/2014    treated/removed as above    There were no vitals filed for this visit.  Visit Diagnosis:  Pain in joint, shoulder region, right  Stiffness of joint, shoulder region, right  Weakness of right upper extremity  Tight fascia      Subjective Assessment - 02/18/15 0911    Subjective  S: My pain is still the same.   Currently in Pain? Yes   Pain Score 5    Pain Location Shoulder   Pain Orientation Right   Pain Descriptors / Indicators Aching   Pain Type Chronic pain            [facility] OT Assessment - 02/18/15 0911    Assessment   Diagnosis Right shoulder pain   Precautions   Precautions None                  OT Treatments/Exercises (OP) - 02/18/15 0912    Exercises   Exercises Shoulder   Shoulder Exercises: Supine   Protraction PROM;5 reps;Strengthening;12 reps   Protraction Weight (lbs) 1   Horizontal ABduction PROM;5 reps;Strengthening;12 reps   Horizontal ABduction Weight (lbs) 1   External Rotation PROM;5 reps;Strengthening;12 reps   External Rotation Weight (lbs) 1   Internal Rotation PROM;5 reps;Strengthening;12 reps   Internal Rotation Weight (lbs) 1   Flexion PROM;5 reps;Strengthening;12 reps   Shoulder Flexion Weight (lbs) 1   ABduction PROM;5 reps;Strengthening;12 reps   Shoulder ABduction Weight (lbs) 1   Shoulder Exercises: Seated   Protraction Strengthening;12 reps   Protraction Weight (lbs) 1   Horizontal ABduction Strengthening;12 reps   Horizontal ABduction Weight (lbs) 1   External Rotation Strengthening;12 reps   External Rotation Weight (lbs) 1   Internal Rotation Strengthening;12 reps   Internal Rotation Weight (lbs) 1   Flexion Strengthening;12 reps   Flexion Weight (lbs) 1   Abduction Strengthening;12 reps   ABduction Weight (lbs) 1   Shoulder Exercises: Standing   Extension Theraband;12 reps   Theraband Level (Shoulder Extension) Level 3 (Green)   Row [employer] reps   Theraband Level (Shoulder Row) Level 3 (Green)   Retraction Theraband;12 reps  Theraband Level (Shoulder Retraction) Level 3 (Green)   Shoulder Exercises: ROM/Strengthening   Proximal Shoulder Strengthening, Supine 12X with 1# no rest breaks   Manual Therapy   Manual Therapy Myofascial release   Myofascial Release Myofascial release to right upper arm, deltoid, trapezius, and scapularis regions to decrease pain and fascial restrictions and increase joint mobility.                   OT Short Term Goals - 02/06/15 1256    OT SHORT TERM GOAL #1   Title Patient will be educated and instructed on HEP.    Time 3   Period Weeks   Status On-going   OT SHORT TERM GOAL #2   Title Patient will increase AROM to WNL to increase ability to complete overhead tasks  with less difficulty.    Time 3   Period Weeks   Status On-going   OT SHORT TERM GOAL #3   Title Patient will increase RUE strength to 4/5 to increase ability to complete work related tasks.    Time 3   Period Weeks   Status On-going   OT SHORT TERM GOAL #4   Title Patient will decrease fascial restrctions to min amount.    Time 3   Period Weeks   Status On-going   OT SHORT TERM GOAL #5   Title Patient will increase pain level in RUE to 4/10 during daily tasks.    Time 3   Period Weeks   Status On-going           OT Long Term Goals - 02/06/15 1256    OT LONG TERM GOAL #1   Title Patient will return to highest level of independence with daily and work related tasks.    Time 6   Period Weeks   Status On-going   OT LONG TERM GOAL #2   Title Patient will increase RUE strenght to 4+/5 to increase ability to complete household chores.    Time 6   Period Weeks   Status On-going   OT LONG TERM GOAL #3   Title Patient will decrease fascial restrictions to zero.   Time 6   Period Weeks   Status On-going   OT LONG TERM GOAL #4   Title Patient will decrease pain to 2/10 or less when completing daily tasks.    Time 6   Period Weeks   Status On-going   OT LONG TERM GOAL #5   Status On-going               Plan - 02/18/15 0934    Clinical Impression Statement A: Added 1# handweight to supine and seated exercises. Patient tolerated well. Patient has full PROM this date.   Plan P: Cont to focus on scapular strengthening exercises. Add UBE bike and ball on the wall.         Problem List Patient Active Problem List   Diagnosis Date Noted  . Headache 11/20/2014  . Dizziness 11/20/2014  . Hepatic cirrhosis 11/05/2014  . Chest pain 11/05/2014  . Dysphagia 11/05/2014  . History of hepatitis C 11/05/2014  . Sleep apnea, obstructive 01/17/2013  . Paroxysmal atrial tachycardia 01/17/2013  . HTN (hypertension) 01/17/2013  . Morbid obesity 01/17/2013  . Chest pain at  rest 01/17/2013  . Difficulty in walking(719.7) 02/17/2012  . Bilateral leg weakness 02/17/2012  . Back pain 02/16/2012  . Bilateral anterior knee pain 02/16/2012  . Knee pain, left 02/16/2012    Mickel Baas  Dehlia Kilner, OTR/L,CBIS  [phone]  02/18/2015, 9:37 AM  [place] [address] [place], [place], [address] Phone: [phone]   Fax:  [phone]

## 2015-02-20 ENCOUNTER — Ambulatory Visit (HOSPITAL_COMMUNITY): Payer: Commercial Managed Care - HMO

## 2015-02-25 ENCOUNTER — Ambulatory Visit (HOSPITAL_COMMUNITY): Payer: Commercial Managed Care - HMO | Attending: Family Medicine | Admitting: Occupational Therapy

## 2015-02-25 ENCOUNTER — Encounter (HOSPITAL_COMMUNITY): Payer: Commercial Managed Care - HMO | Admitting: Specialist

## 2015-02-25 ENCOUNTER — Encounter (HOSPITAL_COMMUNITY): Payer: Self-pay | Admitting: Occupational Therapy

## 2015-02-25 DIAGNOSIS — M629 Disorder of muscle, unspecified: Secondary | ICD-10-CM | POA: Insufficient documentation

## 2015-02-25 DIAGNOSIS — M25611 Stiffness of right shoulder, not elsewhere classified: Secondary | ICD-10-CM

## 2015-02-25 DIAGNOSIS — R29898 Other symptoms and signs involving the musculoskeletal system: Secondary | ICD-10-CM | POA: Diagnosis not present

## 2015-02-25 DIAGNOSIS — M25511 Pain in right shoulder: Secondary | ICD-10-CM | POA: Diagnosis not present

## 2015-02-25 DIAGNOSIS — M6289 Other specified disorders of muscle: Secondary | ICD-10-CM

## 2015-02-25 NOTE — Therapy (Signed)
[place], [place], [address] Phone: [phone]   Fax:  [phone]  Occupational Therapy Treatment  Patient Details  Name: Megan Odom MRN: [id number] Date of Birth: 1945/09/28 Referring Provider:  Marilynne Drivers, FNP  Encounter Date: 02/25/2015      OT End of Session - 02/25/15 1201    Visit Number 5   Number of Visits 12  mini reassess 02/25/15   Date for OT Re-Evaluation 03/28/15   Authorization Type [employer] (HMO)   Authorization Time Period before 10th visit   Authorization - Visit Number 5   Authorization - Number of Visits 10   OT Start Time 1010   OT Stop Time 1034   OT Time Calculation (min) 24 min   Activity Tolerance Patient tolerated treatment well   Behavior During Therapy [facility] for tasks assessed/performed      Past Medical History  Diagnosis Date  . Hypertension   . Asthma   . Arthritis   . Hep C w/o coma, chronic   . Type 2 diabetes mellitus   . Hyperlipemia   . Depression     Past Surgical History  Procedure Laterality Date  . Cholecystectomy    . Appendectomy    . Abdominal hysterectomy    . Breast surgery    . Colonoscopy N/A 07/03/2014    treated/removed as above    There were no vitals filed for this visit.  Visit Diagnosis:  Pain in joint, shoulder region, right  Stiffness of joint, shoulder region, right  Weakness of right upper extremity  Tight fascia      Subjective Assessment - 02/25/15 1013    Subjective  S: My arm is really hurting this morning. It feels weak   Currently in Pain? Yes   Pain Score 7    Pain Location Shoulder   Pain Orientation Right   Pain Descriptors / Indicators Aching   Pain Type Acute pain            [facility] OT Assessment - 02/25/15 1200    Assessment   Diagnosis Right shoulder pain   Precautions   Precautions None                  OT Treatments/Exercises (OP) - 02/25/15 1014    Exercises   Exercises Shoulder   Shoulder Exercises: Supine   Protraction PROM;5 reps;Strengthening;12 reps   Protraction Weight (lbs) 1   Horizontal ABduction PROM;5 reps;Strengthening;12 reps   Horizontal ABduction Weight (lbs) 1   External Rotation PROM;5 reps;Strengthening;12 reps   External Rotation Weight (lbs) 1   Internal Rotation PROM;5 reps;Strengthening;12 reps   Internal Rotation Weight (lbs) 1   Flexion PROM;5 reps;Strengthening;12 reps   Shoulder Flexion Weight (lbs) 1   ABduction PROM;5 reps;Strengthening;12 reps   Shoulder ABduction Weight (lbs) 1   Shoulder Exercises: ROM/Strengthening   UBE (Upper Arm Bike) Level 1 2' forward 2' reverse   Proximal Shoulder Strengthening, Supine 12X with 1# no rest breaks   Manual Therapy   Manual Therapy Myofascial release   Myofascial Release Myofascial release to right upper arm, deltoid, trapezius, and scapularis regions to decrease pain and fascial restrictions and increase joint mobility.                   OT Short Term Goals - 02/06/15 1256    OT SHORT TERM GOAL #1   Title Patient will be educated and instructed on HEP.  Time 3   Period Weeks   Status On-going   OT SHORT TERM GOAL #2   Title Patient will increase AROM to WNL to increase ability to complete overhead tasks with less difficulty.    Time 3   Period Weeks   Status On-going   OT SHORT TERM GOAL #3   Title Patient will increase RUE strength to 4/5 to increase ability to complete work related tasks.    Time 3   Period Weeks   Status On-going   OT SHORT TERM GOAL #4   Title Patient will decrease fascial restrctions to min amount.    Time 3   Period Weeks   Status On-going   OT SHORT TERM GOAL #5   Title Patient will increase pain level in RUE to 4/10 during daily tasks.    Time 3   Period Weeks   Status On-going           OT Long Term Goals - 02/06/15 1256    OT LONG TERM GOAL #1   Title Patient will return to highest level of independence with  daily and work related tasks.    Time 6   Period Weeks   Status On-going   OT LONG TERM GOAL #2   Title Patient will increase RUE strenght to 4+/5 to increase ability to complete household chores.    Time 6   Period Weeks   Status On-going   OT LONG TERM GOAL #3   Title Patient will decrease fascial restrictions to zero.   Time 6   Period Weeks   Status On-going   OT LONG TERM GOAL #4   Title Patient will decrease pain to 2/10 or less when completing daily tasks.    Time 6   Period Weeks   Status On-going   OT LONG TERM GOAL #5   Status On-going               Plan - 02/25/15 1201    Clinical Impression Statement A: Added UBE bike this session. Pt complained of shoulder being sore & hurting at beginning of session, however immediately positioned RUE up & over her head. Pt had minimal tightness this session and good range of motion. Pt had full P/AROM this session.    Plan P: Mini-reassessment. Resume missed exercises. Add ball on wall.         Problem List Patient Active Problem List   Diagnosis Date Noted  . Headache 11/20/2014  . Dizziness 11/20/2014  . Hepatic cirrhosis 11/05/2014  . Chest pain 11/05/2014  . Dysphagia 11/05/2014  . History of hepatitis C 11/05/2014  . Sleep apnea, obstructive 01/17/2013  . Paroxysmal atrial tachycardia 01/17/2013  . HTN (hypertension) 01/17/2013  . Morbid obesity 01/17/2013  . Chest pain at rest 01/17/2013  . Difficulty in walking(719.7) 02/17/2012  . Bilateral leg weakness 02/17/2012  . Back pain 02/16/2012  . Bilateral anterior knee pain 02/16/2012  . Knee pain, left 02/16/2012    Guadelupe Sabin, OTR/L  [phone]  02/25/2015, 12:05 PM  [facility] [address] [place], [place], [address] Phone: [phone]   Fax:  [phone]

## 2015-02-27 ENCOUNTER — Encounter (HOSPITAL_COMMUNITY): Payer: Commercial Managed Care - HMO

## 2015-03-03 ENCOUNTER — Encounter (HOSPITAL_COMMUNITY): Payer: Commercial Managed Care - HMO

## 2015-03-04 ENCOUNTER — Encounter (HOSPITAL_COMMUNITY): Payer: Commercial Managed Care - HMO | Admitting: Specialist

## 2015-03-05 ENCOUNTER — Ambulatory Visit (HOSPITAL_COMMUNITY): Payer: Commercial Managed Care - HMO

## 2015-03-05 ENCOUNTER — Encounter (HOSPITAL_COMMUNITY): Payer: Self-pay

## 2015-03-05 DIAGNOSIS — M25511 Pain in right shoulder: Secondary | ICD-10-CM | POA: Diagnosis not present

## 2015-03-05 DIAGNOSIS — M6289 Other specified disorders of muscle: Secondary | ICD-10-CM

## 2015-03-05 DIAGNOSIS — M629 Disorder of muscle, unspecified: Secondary | ICD-10-CM | POA: Diagnosis not present

## 2015-03-05 DIAGNOSIS — M25611 Stiffness of right shoulder, not elsewhere classified: Secondary | ICD-10-CM

## 2015-03-05 DIAGNOSIS — R29898 Other symptoms and signs involving the musculoskeletal system: Secondary | ICD-10-CM | POA: Diagnosis not present

## 2015-03-05 NOTE — Therapy (Signed)
[place], [place], [address] Phone: [phone]   Fax:  [phone]  Occupational Therapy Treatment  Patient Details  Name: Megan Odom MRN: [id number] Date of Birth: [birth year] Referring Provider:  Marilynne Drivers, FNP  Encounter Date: 03/05/2015      OT End of Session - 03/05/15 0958    Visit Number 6   Number of Visits 12   Date for OT Re-Evaluation 03/28/15   Authorization Type [employer] (HMO)   Authorization Time Period before 16th visit   Authorization - Visit Number 6   Authorization - Number of Visits 16   OT Start Time [phone]   OT Stop Time 0943   OT Time Calculation (min) 54 min   Activity Tolerance Patient tolerated treatment well   Behavior During Therapy [facility] for tasks assessed/performed      Past Medical History  Diagnosis Date  . Hypertension   . Asthma   . Arthritis   . Hep C w/o coma, chronic   . Type 2 diabetes mellitus   . Hyperlipemia   . Depression     Past Surgical History  Procedure Laterality Date  . Cholecystectomy    . Appendectomy    . Abdominal hysterectomy    . Breast surgery    . Colonoscopy N/A 07/03/2014    treated/removed as above    There were no vitals filed for this visit.  Visit Diagnosis:  Pain in joint, shoulder region, right  Stiffness of joint, shoulder region, right  Tight fascia      Subjective Assessment - 03/05/15 0919    Subjective  S: [facility]   Currently in Pain? Yes   Pain Score 8    Pain Location Shoulder   Pain Orientation Right   Pain Descriptors / Indicators Aching   Pain Type Acute pain            [facility] OT Assessment - 03/05/15 0903    Assessment   Diagnosis Right shoulder pain   Precautions   Precautions None   AROM   Overall AROM Comments Assessed seated. IR/ER adducted   AROM Assessment Site Shoulder   Right Shoulder Flexion 180 Degrees  on eval: 155    Right Shoulder ABduction 180 Degrees  on eval: 155   Right Shoulder Internal Rotation 90 Degrees  same on eval   Right Shoulder External Rotation 90 Degrees  on eval; 50   PROM   Overall PROM  Within functional limits for tasks performed   PROM Assessment Site Shoulder   Right/Left Shoulder Right   Strength   Overall Strength Comments Assessed seated. IR/ER adducted.   Strength Assessment Site Shoulder   Right Shoulder Flexion 4-/5  on eval: 4-/5   Right Shoulder ABduction 3/5  on eval: 4-/5   Right Shoulder Internal Rotation 3/5  on eval: 4-/5   Right Shoulder External Rotation 3/5  on eval: 4/5                  OT Treatments/Exercises (OP) - 03/05/15 0903    Exercises   Exercises Shoulder   Shoulder Exercises: Supine   Protraction PROM;5 reps   Horizontal ABduction PROM;5 reps   External Rotation PROM;5 reps   Internal Rotation PROM;5 reps   Flexion PROM;5 reps   ABduction PROM;5 reps   Modalities   Modalities Electrical Stimulation;Moist Heat   Moist Heat Therapy   Number  Minutes Moist Heat 10 Minutes   Moist Heat Location Shoulder   Electrical Stimulation   Electrical Stimulation Location right shoulder   Electrical Stimulation Action 7.4 CV   Electrical Stimulation Parameters Interferential   Electrical Stimulation Goals Pain   Manual Therapy   Manual Therapy Myofascial release   Myofascial Release Myofascial release to right upper arm, deltoid, trapezius, and scapularis regions to decrease pain and fascial restrictions and increase joint mobility.                 OT Education - 03-07-2015 [phone]    Education Details Reviewed goals and discussed progress with therapy. Dicussed possible reasons for increased pain with movement.    Person(s) Educated Patient   Methods Explanation   Comprehension Verbalized understanding          OT Short Term Goals - 2015-03-07 0918    OT SHORT TERM GOAL #1   Title Patient will be educated and instructed on  HEP.    Time 3   Period Weeks   Status Achieved   OT SHORT TERM GOAL #2   Title Patient will increase AROM to WNL to increase ability to complete overhead tasks with less difficulty.    Time 3   Period Weeks   Status Achieved   OT SHORT TERM GOAL #3   Title Patient will increase RUE strength to 4/5 to increase ability to complete work related tasks.    Time 3   Period Weeks   Status On-going   OT SHORT TERM GOAL #4   Title Patient will decrease fascial restrctions to min amount.    Time 3   Period Weeks   Status Achieved   OT SHORT TERM GOAL #5   Title Patient will increase pain level in RUE to 4/10 during daily tasks.    Time 3   Period Weeks   Status On-going           OT Long Term Goals - 02/06/15 1256    OT LONG TERM GOAL #1   Title Patient will return to highest level of independence with daily and work related tasks.    Time 6   Period Weeks   Status On-going   OT LONG TERM GOAL #2   Title Patient will increase RUE strenght to 4+/5 to increase ability to complete household chores.    Time 6   Period Weeks   Status On-going   OT LONG TERM GOAL #3   Title Patient will decrease fascial restrictions to zero.   Time 6   Period Weeks   Status On-going   OT LONG TERM GOAL #4   Title Patient will decrease pain to 2/10 or less when completing daily tasks.    Time 6   Period Weeks   Status On-going   OT LONG TERM GOAL #5   Status On-going               Plan - 03/07/15 0919    Clinical Impression Statement A: Mini reassessment completed this date. Patient's AROM is WNL however she continues to experience increased pain with RUE use. Pt's strength has decreased in all shoulder ranges except shoulder flexion. Pt reports that she has good days and bad days. ES and Heat completed today for pain management. Pt reports pain level of 6/10 at end of session.   Plan P: Resume missed exercises. Add ball on the wall. Update HEP.          G-Codes - 07-Mar-2015  1000     Functional Assessment Tool Used FOTO score: 60/100 (40% impaired)   Functional Limitation Carrying, moving and handling objects   Carrying, Moving and Handling Objects Current Status (H7026) At least 40 percent but less than 60 percent impaired, limited or restricted   Carrying, Moving and Handling Objects Goal Status (V7858) At least 1 percent but less than 20 percent impaired, limited or restricted      Problem List Patient Active Problem List   Diagnosis Date Noted  . Headache 11/20/2014  . Dizziness 11/20/2014  . Hepatic cirrhosis 11/05/2014  . Chest pain 11/05/2014  . Dysphagia 11/05/2014  . History of hepatitis C 11/05/2014  . Sleep apnea, obstructive 01/17/2013  . Paroxysmal atrial tachycardia 01/17/2013  . HTN (hypertension) 01/17/2013  . Morbid obesity 01/17/2013  . Chest pain at rest 01/17/2013  . Difficulty in walking(719.7) 02/17/2012  . Bilateral leg weakness 02/17/2012  . Back pain 02/16/2012  . Bilateral anterior knee pain 02/16/2012  . Knee pain, left 02/16/2012  Occupational Therapy Progress Note  Dates of Reporting Period: 01/27/15   to 03/05/15  Objective Reports of Subjective Statement: See clinical impression statement  Objective Measurements: See OT assessment for measurements  Goal Update: Patient has met 3/5 STGs.  Plan: Pt has not been coming to therapy consistently and it's recommended that she continue with therapy 2X a week for 3 more weeks to continue progressing towards therapy goals.   Reason Skilled Services are Required: Patient continues to have increased pain with RUE use and decrease strength needed to complete functional daily and work tasks.    Ailene Ravel, OTR/L,CBIS  [phone]  03/05/2015, 10:06 AM  [facility] [address] [place], [place], [address] Phone: [phone]   Fax:  [phone]

## 2015-03-10 ENCOUNTER — Encounter (HOSPITAL_COMMUNITY): Payer: Self-pay | Admitting: Occupational Therapy

## 2015-03-10 ENCOUNTER — Ambulatory Visit (HOSPITAL_COMMUNITY): Payer: Commercial Managed Care - HMO | Admitting: Occupational Therapy

## 2015-03-10 DIAGNOSIS — M25511 Pain in right shoulder: Secondary | ICD-10-CM

## 2015-03-10 DIAGNOSIS — M629 Disorder of muscle, unspecified: Secondary | ICD-10-CM

## 2015-03-10 DIAGNOSIS — R29898 Other symptoms and signs involving the musculoskeletal system: Secondary | ICD-10-CM | POA: Diagnosis not present

## 2015-03-10 DIAGNOSIS — M6289 Other specified disorders of muscle: Secondary | ICD-10-CM

## 2015-03-10 DIAGNOSIS — M25611 Stiffness of right shoulder, not elsewhere classified: Secondary | ICD-10-CM | POA: Diagnosis not present

## 2015-03-10 NOTE — Therapy (Signed)
[place], [place], [address] Phone: [phone]   Fax:  [phone]  Occupational Therapy Treatment  Patient Details  Name: Megan Odom MRN: [id number] Date of Birth: April 30, 1946 Referring Provider:  Marilynne Drivers, FNP  Encounter Date: 03/10/2015      OT End of Session - 03/10/15 1427    Visit Number 6   Number of Visits 12   Date for OT Re-Evaluation 03/28/15   Authorization Type [employer] (HMO)   Authorization Time Period before 16th visit   Authorization - Visit Number 6   Authorization - Number of Visits 16   OT Start Time 1350   OT Stop Time 1430   OT Time Calculation (min) 40 min   Activity Tolerance Patient tolerated treatment well   Behavior During Therapy [facility] for tasks assessed/performed      Past Medical History  Diagnosis Date  . Hypertension   . Asthma   . Arthritis   . Hep C w/o coma, chronic   . Type 2 diabetes mellitus   . Hyperlipemia   . Depression     Past Surgical History  Procedure Laterality Date  . Cholecystectomy    . Appendectomy    . Abdominal hysterectomy    . Breast surgery    . Colonoscopy N/A 07/03/2014    treated/removed as above    There were no vitals filed for this visit.  Visit Diagnosis:  Pain in joint, shoulder region, right  Stiffness of joint, shoulder region, right  Tight fascia  Weakness of right upper extremity      Subjective Assessment - 03/10/15 1352    Subjective  S: My arm was so sore yesterday it was awful.    Currently in Pain? Yes   Pain Score 5    Pain Location Shoulder   Pain Orientation Right   Pain Descriptors / Indicators Aching   Pain Type Acute pain            [facility] OT Assessment - 03/10/15 1427    Assessment   Diagnosis Right shoulder pain   Precautions   Precautions None                  OT Treatments/Exercises (OP) - 03/10/15 1353    Exercises   Exercises Shoulder   Shoulder  Exercises: Supine   Protraction PROM;5 reps;Strengthening;12 reps   Protraction Weight (lbs) 1   Horizontal ABduction PROM;5 reps;Strengthening;12 reps   Horizontal ABduction Weight (lbs) 1   External Rotation PROM;5 reps;Strengthening;12 reps   External Rotation Weight (lbs) 1   Internal Rotation PROM;5 reps;Strengthening;12 reps   Internal Rotation Weight (lbs) 1   Flexion PROM;5 reps;Strengthening;12 reps   Shoulder Flexion Weight (lbs) 1   ABduction PROM;5 reps;Strengthening;12 reps   Shoulder ABduction Weight (lbs) 1   Shoulder Exercises: ROM/Strengthening   Proximal Shoulder Strengthening, Supine 12X no rest breaks   Modalities   Modalities Electrical Stimulation;Moist Heat   Moist Heat Therapy   Number Minutes Moist Heat 10 Minutes   Moist Heat Location Shoulder   Electrical Stimulation   Electrical Stimulation Location right shoulder   Electrical Stimulation Action 8.2 CV   Electrical Stimulation Parameters Interferential    Electrical Stimulation Goals Pain   Manual Therapy   Manual Therapy Myofascial release   Myofascial Release Myofascial release to right upper arm, deltoid, trapezius, and scapularis regions to decrease pain and fascial restrictions and increase joint mobility.  OT Short Term Goals - 03/05/15 1610    OT SHORT TERM GOAL #1   Title Patient will be educated and instructed on HEP.    Time 3   Period Weeks   Status Achieved   OT SHORT TERM GOAL #2   Title Patient will increase AROM to WNL to increase ability to complete overhead tasks with less difficulty.    Time 3   Period Weeks   Status Achieved   OT SHORT TERM GOAL #3   Title Patient will increase RUE strength to 4/5 to increase ability to complete work related tasks.    Time 3   Period Weeks   Status On-going   OT SHORT TERM GOAL #4   Title Patient will decrease fascial restrctions to min amount.    Time 3   Period Weeks   Status Achieved   OT SHORT TERM GOAL  #5   Title Patient will increase pain level in RUE to 4/10 during daily tasks.    Time 3   Period Weeks   Status On-going           OT Long Term Goals - 02/06/15 1256    OT LONG TERM GOAL #1   Title Patient will return to highest level of independence with daily and work related tasks.    Time 6   Period Weeks   Status On-going   OT LONG TERM GOAL #2   Title Patient will increase RUE strenght to 4+/5 to increase ability to complete household chores.    Time 6   Period Weeks   Status On-going   OT LONG TERM GOAL #3   Title Patient will decrease fascial restrictions to zero.   Time 6   Period Weeks   Status On-going   OT LONG TERM GOAL #4   Title Patient will decrease pain to 2/10 or less when completing daily tasks.    Time 6   Period Weeks   Status On-going   OT LONG TERM GOAL #5   Status On-going               Plan - 03/10/15 1610    Clinical Impression Statement A: Pt reports continued pain in RUE at beginning of session, requesting e-stim/moist heat today. Pt reports she took some medicate before session to help with the pain. Pt completed supine strengthening exercises this session.    Plan P: Resume missed exercises, add ball on wall. Update HEP for current exercises.         Problem List Patient Active Problem List   Diagnosis Date Noted  . Headache 11/20/2014  . Dizziness 11/20/2014  . Hepatic cirrhosis 11/05/2014  . Chest pain 11/05/2014  . Dysphagia 11/05/2014  . History of hepatitis C 11/05/2014  . Sleep apnea, obstructive 01/17/2013  . Paroxysmal atrial tachycardia 01/17/2013  . HTN (hypertension) 01/17/2013  . Morbid obesity 01/17/2013  . Chest pain at rest 01/17/2013  . Difficulty in walking(719.7) 02/17/2012  . Bilateral leg weakness 02/17/2012  . Back pain 02/16/2012  . Bilateral anterior knee pain 02/16/2012  . Knee pain, left 02/16/2012    Guadelupe Sabin, OTR/L  [phone]  03/10/2015, 4:12 PM  [facility] [address] [place], [place], [address] Phone: [phone]   Fax:  [phone]

## 2015-03-11 ENCOUNTER — Encounter (HOSPITAL_COMMUNITY): Payer: Self-pay | Admitting: Occupational Therapy

## 2015-03-11 ENCOUNTER — Ambulatory Visit (HOSPITAL_COMMUNITY): Payer: Commercial Managed Care - HMO | Admitting: Occupational Therapy

## 2015-03-11 DIAGNOSIS — M6289 Other specified disorders of muscle: Secondary | ICD-10-CM

## 2015-03-11 DIAGNOSIS — R29898 Other symptoms and signs involving the musculoskeletal system: Secondary | ICD-10-CM | POA: Diagnosis not present

## 2015-03-11 DIAGNOSIS — M25511 Pain in right shoulder: Secondary | ICD-10-CM

## 2015-03-11 DIAGNOSIS — M25611 Stiffness of right shoulder, not elsewhere classified: Secondary | ICD-10-CM

## 2015-03-11 DIAGNOSIS — M629 Disorder of muscle, unspecified: Secondary | ICD-10-CM

## 2015-03-11 NOTE — Therapy (Signed)
[place], [place], [address] Phone: [phone]   Fax:  [phone]  Occupational Therapy Treatment  Patient Details  Name: Megan Odom MRN: [id number] Date of Birth: 1945/08/26 Referring Provider:  Marilynne Drivers, FNP  Encounter Date: 03/11/2015      OT End of Session - 03/11/15 1155    Visit Number 7   Number of Visits 12   Date for OT Re-Evaluation 03/28/15   Authorization Type [employer] (HMO)   Authorization Time Period before 16th visit   Authorization - Visit Number 7   Authorization - Number of Visits 16   OT Start Time 0845   OT Stop Time 0930   OT Time Calculation (min) 45 min   Activity Tolerance Patient tolerated treatment well   Behavior During Therapy [facility] for tasks assessed/performed      Past Medical History  Diagnosis Date  . Hypertension   . Asthma   . Arthritis   . Hep C w/o coma, chronic   . Type 2 diabetes mellitus   . Hyperlipemia   . Depression     Past Surgical History  Procedure Laterality Date  . Cholecystectomy    . Appendectomy    . Abdominal hysterectomy    . Breast surgery    . Colonoscopy N/A 07/03/2014    treated/removed as above    There were no vitals filed for this visit.  Visit Diagnosis:  Pain in joint, shoulder region, right  Stiffness of joint, shoulder region, right  Tight fascia  Weakness of right upper extremity      Subjective Assessment - 03/11/15 0849    Subjective  S: I'm tired of this pain.    Currently in Pain? Yes   Pain Score 8    Pain Location Shoulder   Pain Orientation Right   Pain Descriptors / Indicators Aching   Pain Type Acute pain            [facility] OT Assessment - 03/11/15 0843    Assessment   Diagnosis Right shoulder pain   Precautions   Precautions None                  OT Treatments/Exercises (OP) - 03/11/15 0908    Exercises   Exercises Shoulder   Shoulder Exercises: Supine   Protraction PROM;5 reps;AROM;15 reps   Horizontal ABduction PROM;5 reps;AROM;15 reps   External Rotation PROM;5 reps;AROM;15 reps   Internal Rotation PROM;5 reps;AROM;15 reps   Flexion PROM;5 reps;AROM;15 reps   ABduction PROM;5 reps;AROM;15 reps   Modalities   Modalities Electrical Stimulation;Moist Heat   Moist Heat Therapy   Number Minutes Moist Heat 10 Minutes   Moist Heat Location Shoulder   Electrical Stimulation   Electrical Stimulation Location right shoulder   Electrical Stimulation Action 8.2CV   Electrical Stimulation Parameters Interferential   Electrical Stimulation Goals Pain   Manual Therapy   Manual Therapy Myofascial release   Myofascial Release Myofascial release to right upper arm, deltoid, trapezius, and scapularis regions to decrease pain and fascial restrictions and increase joint mobility.                   OT Short Term Goals - 03/05/15 7371    OT SHORT TERM GOAL #1   Title Patient will be educated and instructed on HEP.    Time 3   Period Weeks   Status Achieved   OT SHORT TERM GOAL #2   Title Patient  will increase AROM to WNL to increase ability to complete overhead tasks with less difficulty.    Time 3   Period Weeks   Status Achieved   OT SHORT TERM GOAL #3   Title Patient will increase RUE strength to 4/5 to increase ability to complete work related tasks.    Time 3   Period Weeks   Status On-going   OT SHORT TERM GOAL #4   Title Patient will decrease fascial restrctions to min amount.    Time 3   Period Weeks   Status Achieved   OT SHORT TERM GOAL #5   Title Patient will increase pain level in RUE to 4/10 during daily tasks.    Time 3   Period Weeks   Status On-going           OT Long Term Goals - 02/06/15 1256    OT LONG TERM GOAL #1   Title Patient will return to highest level of independence with daily and work related tasks.    Time 6   Period Weeks   Status On-going   OT LONG TERM GOAL #2   Title Patient will  increase RUE strenght to 4+/5 to increase ability to complete household chores.    Time 6   Period Weeks   Status On-going   OT LONG TERM GOAL #3   Title Patient will decrease fascial restrictions to zero.   Time 6   Period Weeks   Status On-going   OT LONG TERM GOAL #4   Title Patient will decrease pain to 2/10 or less when completing daily tasks.    Time 6   Period Weeks   Status On-going   OT LONG TERM GOAL #5   Status On-going               Plan - 03/11/15 1156    Clinical Impression Statement A: Pt reports increased pain this am, unable to sleep much last night due to pain in shoulder. Pt requests e-stim/moist heat for pain relief. Pt reports pain at 5/10 at end of session versus 8/10 at beginning of session.    Plan P: Complete exercises in standing, review exercises and update HEP. E-stim if time allows         Problem List Patient Active Problem List   Diagnosis Date Noted  . Headache 11/20/2014  . Dizziness 11/20/2014  . Hepatic cirrhosis 11/05/2014  . Chest pain 11/05/2014  . Dysphagia 11/05/2014  . History of hepatitis C 11/05/2014  . Sleep apnea, obstructive 01/17/2013  . Paroxysmal atrial tachycardia 01/17/2013  . HTN (hypertension) 01/17/2013  . Morbid obesity 01/17/2013  . Chest pain at rest 01/17/2013  . Difficulty in walking(719.7) 02/17/2012  . Bilateral leg weakness 02/17/2012  . Back pain 02/16/2012  . Bilateral anterior knee pain 02/16/2012  . Knee pain, left 02/16/2012    Guadelupe Sabin, OTR/L  [phone]  03/11/2015, 11:58 AM  [place], [place], [address] Phone: [phone]   Fax:  [phone]

## 2015-03-16 ENCOUNTER — Encounter (HOSPITAL_COMMUNITY): Payer: Commercial Managed Care - HMO

## 2015-03-18 ENCOUNTER — Ambulatory Visit (HOSPITAL_COMMUNITY): Payer: Commercial Managed Care - HMO

## 2015-03-19 ENCOUNTER — Ambulatory Visit (HOSPITAL_COMMUNITY): Payer: Commercial Managed Care - HMO

## 2015-03-19 ENCOUNTER — Encounter (HOSPITAL_COMMUNITY): Payer: Self-pay

## 2015-03-19 DIAGNOSIS — M25511 Pain in right shoulder: Secondary | ICD-10-CM

## 2015-03-19 DIAGNOSIS — M6289 Other specified disorders of muscle: Secondary | ICD-10-CM

## 2015-03-19 DIAGNOSIS — R29898 Other symptoms and signs involving the musculoskeletal system: Secondary | ICD-10-CM

## 2015-03-19 DIAGNOSIS — M629 Disorder of muscle, unspecified: Secondary | ICD-10-CM | POA: Diagnosis not present

## 2015-03-19 DIAGNOSIS — M25611 Stiffness of right shoulder, not elsewhere classified: Secondary | ICD-10-CM | POA: Diagnosis not present

## 2015-03-19 NOTE — Patient Instructions (Signed)
1) Shoulder Protraction    Begin with elbows by your side, slowly "punch" straight out in front of you. Repeat 12___times.     2) Shoulder Flexion  Supine:     Standing:         Begin with arms at your side with thumbs pointed up, slowly raise both arms up and forward towards overhead. Repeat_12__times    3) Horizontal abduction/adduction  Supine:   Standing:           Begin with arms straight out in front of you, bring out to the side in at "T" shape. Keep arms straight entire time. Repeat _12___times      4) Internal & External Rotation    *No band* -Stand with elbows at the side and elbows bent 90 degrees. Move your forearms away from your body, then bring back inward toward the body.  Repeat _12__times    5) Shoulder Abduction  Supine:     Standing:       Lying on your back begin with your arms flat on the table next to your side. Slowly move your arms out to the side so that they go overhead, in a jumping jack or snow angel movement. Repeat _12__times.

## 2015-03-19 NOTE — Therapy (Signed)
[place], [place], [address] Phone: [phone]   Fax:  [phone]  Occupational Therapy Treatment  Patient Details  Name: Megan Odom MRN: [id number] Date of Birth: 1945/12/31 Referring Provider:  Marilynne Drivers, FNP  Encounter Date: 03/19/2015      OT End of Session - 03/19/15 1108    Visit Number 8   Number of Visits 12   Date for OT Re-Evaluation 03/28/15   Authorization Type [employer] (HMO)   Authorization Time Period before 16th visit   Authorization - Visit Number 8   Authorization - Number of Visits 16   OT Start Time 1020   OT Stop Time 1100   OT Time Calculation (min) 40 min   Activity Tolerance Patient tolerated treatment well   Behavior During Therapy [facility] for tasks assessed/performed      Past Medical History  Diagnosis Date  . Hypertension   . Asthma   . Arthritis   . Hep C w/o coma, chronic   . Type 2 diabetes mellitus   . Hyperlipemia   . Depression     Past Surgical History  Procedure Laterality Date  . Cholecystectomy    . Appendectomy    . Abdominal hysterectomy    . Breast surgery    . Colonoscopy N/A 07/03/2014    treated/removed as above    There were no vitals filed for this visit.  Visit Diagnosis:  Pain in joint, shoulder region, right  Tight fascia  Weakness of right upper extremity  Stiffness of joint, shoulder region, right      Subjective Assessment - 03/19/15 1050    Subjective  S: I put this ointment on my arm and wrapped it with gauze. It irritated my arm some. It felt good when it was on there though.   Currently in Pain? Yes   Pain Score 8    Pain Location Shoulder   Pain Orientation Right   Pain Descriptors / Indicators Aching   Pain Type Acute pain                      OT Treatments/Exercises (OP) - 03/19/15 1053    Exercises   Exercises Shoulder   Shoulder Exercises: Supine   Protraction PROM;5 reps;AROM;15  reps   Horizontal ABduction PROM;5 reps;AROM;15 reps   External Rotation PROM;5 reps;AROM;15 reps   Internal Rotation PROM;5 reps;AROM;15 reps   Flexion PROM;5 reps;AROM;15 reps   ABduction PROM;5 reps;AROM;15 reps   Shoulder Exercises: Seated   Protraction AROM;12 reps   Horizontal ABduction AROM;12 reps   External Rotation AROM;12 reps   Internal Rotation AROM;12 reps   Flexion AROM;12 reps   Abduction AROM;12 reps   Shoulder Exercises: Standing   Protraction --   Horizontal ABduction --   External Rotation --   Internal Rotation --   Flexion --   ABduction --   Shoulder Exercises: ROM/Strengthening   Proximal Shoulder Strengthening, Supine 15X no rest breaks   Proximal Shoulder Strengthening, Seated 12X no rest breaks   Manual Therapy   Manual Therapy Myofascial release   Myofascial Release Myofascial release to right upper arm, deltoid, trapezius, and scapularis regions to decrease pain and fascial restrictions and increase joint mobility.                 OT Education - 03/19/15 1110    Education provided Yes   Education Details AROM exercises   Person(s) Educated Patient  Methods Explanation;Demonstration;Handout   Comprehension Verbalized understanding;Returned demonstration          OT Short Term Goals - 03/19/15 1111    OT SHORT TERM GOAL #1   Title Patient will be educated and instructed on HEP.    Time 3   Period Weeks   OT SHORT TERM GOAL #2   Title Patient will increase AROM to WNL to increase ability to complete overhead tasks with less difficulty.    Time 3   Period Weeks   OT SHORT TERM GOAL #3   Title Patient will increase RUE strength to 4/5 to increase ability to complete work related tasks.    Time 3   Period Weeks   Status On-going   OT SHORT TERM GOAL #4   Title Patient will decrease fascial restrctions to min amount.    Time 3   Period Weeks   OT SHORT TERM GOAL #5   Title Patient will increase pain level in RUE to 4/10 during  daily tasks.    Time 3   Period Weeks   Status On-going           OT Long Term Goals - 03/19/15 1112    OT LONG TERM GOAL #1   Title Patient will return to highest level of independence with daily and work related tasks.    Time 6   Period Weeks   Status On-going   OT LONG TERM GOAL #2   Title Patient will increase RUE strenght to 4+/5 to increase ability to complete household chores.    Time 6   Period Weeks   Status On-going   OT LONG TERM GOAL #3   Title Patient will decrease fascial restrictions to zero.   Time 6   Period Weeks   Status On-going   OT LONG TERM GOAL #4   Title Patient will decrease pain to 2/10 or less when completing daily tasks.    Time 6   Period Weeks   Status On-going               Plan - 03/19/15 1108    Clinical Impression Statement A: Pt states that afternoon appts work best for her. She has had 2 no shows recently and it was because she fell back to sleep. Pt was encouraged to let her arm heal as she has 3-4 mild chemical burns from the ointment she put on it recently. Therapist declined using ES and heat this session until skin abrasions heal more. Updated HEP with AROM exercises.    Plan P: Add X to V arms. Progress back to using 1# weight if able to tolerate.        Problem List Patient Active Problem List   Diagnosis Date Noted  . Headache 11/20/2014  . Dizziness 11/20/2014  . Hepatic cirrhosis 11/05/2014  . Chest pain 11/05/2014  . Dysphagia 11/05/2014  . History of hepatitis C 11/05/2014  . Sleep apnea, obstructive 01/17/2013  . Paroxysmal atrial tachycardia 01/17/2013  . HTN (hypertension) 01/17/2013  . Morbid obesity 01/17/2013  . Chest pain at rest 01/17/2013  . Difficulty in walking(719.7) 02/17/2012  . Bilateral leg weakness 02/17/2012  . Back pain 02/16/2012  . Bilateral anterior knee pain 02/16/2012  . Knee pain, left 02/16/2012    Ailene Ravel, OTR/L,CBIS  [phone]  03/19/2015, 11:12 AM  [place], [place], [address] Phone: [phone]   Fax:  [phone]

## 2015-03-24 ENCOUNTER — Encounter (HOSPITAL_COMMUNITY): Payer: Self-pay | Admitting: Occupational Therapy

## 2015-03-24 ENCOUNTER — Telehealth (HOSPITAL_COMMUNITY): Payer: Self-pay

## 2015-03-24 ENCOUNTER — Ambulatory Visit (HOSPITAL_COMMUNITY): Payer: Commercial Managed Care - HMO | Attending: Family Medicine | Admitting: Occupational Therapy

## 2015-03-24 ENCOUNTER — Encounter (HOSPITAL_COMMUNITY): Payer: Commercial Managed Care - HMO | Admitting: Occupational Therapy

## 2015-03-24 DIAGNOSIS — M25611 Stiffness of right shoulder, not elsewhere classified: Secondary | ICD-10-CM

## 2015-03-24 DIAGNOSIS — R29898 Other symptoms and signs involving the musculoskeletal system: Secondary | ICD-10-CM | POA: Insufficient documentation

## 2015-03-24 DIAGNOSIS — M629 Disorder of muscle, unspecified: Secondary | ICD-10-CM | POA: Diagnosis not present

## 2015-03-24 DIAGNOSIS — M25511 Pain in right shoulder: Secondary | ICD-10-CM | POA: Insufficient documentation

## 2015-03-24 DIAGNOSIS — M6289 Other specified disorders of muscle: Secondary | ICD-10-CM

## 2015-03-24 NOTE — Telephone Encounter (Signed)
Patient had to reschedule her appt,she have to work on Thursday.

## 2015-03-24 NOTE — Therapy (Signed)
[place], [place], [address] Phone: [phone]   Fax:  [phone]  Occupational Therapy Treatment  Patient Details  Name: Megan Odom MRN: [id number] Date of Birth: [birth year] Referring Provider:  Marilynne Drivers, FNP  Encounter Date: 03/24/2015      OT End of Session - 03/24/15 1005    Visit Number 9   Number of Visits 12   Date for OT Re-Evaluation 03/28/15   Authorization Type [employer] (HMO)   Authorization Time Period before 16th visit   Authorization - Visit Number 9   Authorization - Number of Visits 16   OT Start Time 0932   OT Stop Time 1015   OT Time Calculation (min) 43 min   Activity Tolerance Patient tolerated treatment well   Behavior During Therapy [facility] for tasks assessed/performed      Past Medical History  Diagnosis Date  . Hypertension   . Asthma   . Arthritis   . Hep C w/o coma, chronic   . Type 2 diabetes mellitus   . Hyperlipemia   . Depression     Past Surgical History  Procedure Laterality Date  . Cholecystectomy    . Appendectomy    . Abdominal hysterectomy    . Breast surgery    . Colonoscopy N/A 07/03/2014    treated/removed as above    There were no vitals filed for this visit.  Visit Diagnosis:  Pain in joint, shoulder region, right  Tight fascia  Weakness of right upper extremity  Stiffness of joint, shoulder region, right      Subjective Assessment - 03/24/15 1000    Subjective  S: My shoulder is hurting so bad, I don't even know what to do.    Currently in Pain? Yes   Pain Score 10-Worst pain ever   Pain Location Shoulder   Pain Orientation Right   Pain Descriptors / Indicators Aching   Pain Type Acute pain            [facility] OT Assessment - 03/24/15 1001    Assessment   Diagnosis Right shoulder pain   Precautions   Precautions None                  OT Treatments/Exercises (OP) - 03/24/15 1004    Exercises   Exercises Shoulder   Shoulder Exercises: Supine   Protraction PROM;5 reps   Horizontal ABduction PROM;5 reps   External Rotation PROM;5 reps   Internal Rotation PROM;5 reps   Flexion PROM;5 reps   ABduction PROM;5 reps   Modalities   Modalities Electrical Stimulation;Moist Heat   Moist Heat Therapy   Number Minutes Moist Heat 15 Minutes   Moist Heat Location Shoulder   Electrical Stimulation   Electrical Stimulation Location right shoulder   Electrical Stimulation Action 6.0CV   Electrical Stimulation Parameters Interferential   Electrical Stimulation Goals Pain   Manual Therapy   Manual Therapy Myofascial release   Myofascial Release Myofascial release to right upper arm, deltoid, trapezius, and scapularis regions to decrease pain and fascial restrictions and increase joint mobility.                   OT Short Term Goals - 03/19/15 1111    OT SHORT TERM GOAL #1   Title Patient will be educated and instructed on HEP.    Time 3   Period Weeks   OT SHORT TERM GOAL #2   Title Patient will  increase AROM to WNL to increase ability to complete overhead tasks with less difficulty.    Time 3   Period Weeks   OT SHORT TERM GOAL #3   Title Patient will increase RUE strength to 4/5 to increase ability to complete work related tasks.    Time 3   Period Weeks   Status On-going   OT SHORT TERM GOAL #4   Title Patient will decrease fascial restrctions to min amount.    Time 3   Period Weeks   OT SHORT TERM GOAL #5   Title Patient will increase pain level in RUE to 4/10 during daily tasks.    Time 3   Period Weeks   Status On-going           OT Long Term Goals - 03/19/15 1112    OT LONG TERM GOAL #1   Title Patient will return to highest level of independence with daily and work related tasks.    Time 6   Period Weeks   Status On-going   OT LONG TERM GOAL #2   Title Patient will increase RUE strenght to 4+/5 to increase ability to complete household chores.     Time 6   Period Weeks   Status On-going   OT LONG TERM GOAL #3   Title Patient will decrease fascial restrictions to zero.   Time 6   Period Weeks   Status On-going   OT LONG TERM GOAL #4   Title Patient will decrease pain to 2/10 or less when completing daily tasks.    Time 6   Period Weeks   Status On-going               Plan - 03/24/15 1007    Clinical Impression Statement A: Pt called this morning and asked to be worked into schedule if possible. Pt reports 10/10 pain this am, with no relief from exercises/ice, requesting E-STIM at end of session. Pt demonstrates minimal discomfort during PROM exercises. E-STIM and moist heat applied, focusing on pain management. Pt reports pain at 6/10 at end of session. Discussed with pt benefits of calling her MD to discuss constant increased pain and options for testing/treatment.    Plan P: Attempt AROM/[place], 1# weight if able to tolerate. Add x to v arms if possible.         Problem List Patient Active Problem List   Diagnosis Date Noted  . Headache 11/20/2014  . Dizziness 11/20/2014  . Hepatic cirrhosis 11/05/2014  . Chest pain 11/05/2014  . Dysphagia 11/05/2014  . History of hepatitis C 11/05/2014  . Sleep apnea, obstructive 01/17/2013  . Paroxysmal atrial tachycardia 01/17/2013  . HTN (hypertension) 01/17/2013  . Morbid obesity 01/17/2013  . Chest pain at rest 01/17/2013  . Difficulty in walking(719.7) 02/17/2012  . Bilateral leg weakness 02/17/2012  . Back pain 02/16/2012  . Bilateral anterior knee pain 02/16/2012  . Knee pain, left 02/16/2012    Guadelupe Sabin, OTR/L  [phone]  03/24/2015, 10:19 AM  [place], [place], [address] Phone: [phone]   Fax:  [phone]

## 2015-03-25 ENCOUNTER — Encounter (HOSPITAL_COMMUNITY): Payer: Commercial Managed Care - HMO

## 2015-03-25 ENCOUNTER — Ambulatory Visit (HOSPITAL_COMMUNITY): Payer: Commercial Managed Care - HMO | Admitting: Specialist

## 2015-03-25 DIAGNOSIS — M25511 Pain in right shoulder: Secondary | ICD-10-CM | POA: Diagnosis not present

## 2015-03-25 DIAGNOSIS — M25611 Stiffness of right shoulder, not elsewhere classified: Secondary | ICD-10-CM | POA: Diagnosis not present

## 2015-03-25 DIAGNOSIS — M629 Disorder of muscle, unspecified: Secondary | ICD-10-CM | POA: Diagnosis not present

## 2015-03-25 DIAGNOSIS — R29898 Other symptoms and signs involving the musculoskeletal system: Secondary | ICD-10-CM

## 2015-03-25 NOTE — Therapy (Signed)
[place], [place], [address] Phone: [phone]   Fax:  [phone]  Occupational Therapy Treatment  Patient Details  Name: Megan Odom MRN: [id number] Date of Birth: 04/04/1946 Referring Provider:  Marilynne Drivers, FNP  Encounter Date: 03/25/2015      OT End of Session - 03/25/15 1511    Visit Number 10   Number of Visits 12   Date for OT Re-Evaluation 03/28/15   Authorization Type [employer] (HMO)   Authorization Time Period before 16th visit   Authorization - Visit Number 10   Authorization - Number of Visits 16   OT Start Time 1432   OT Stop Time 1523   OT Time Calculation (min) 51 min   Activity Tolerance Patient tolerated treatment well   Behavior During Therapy [facility] for tasks assessed/performed      Past Medical History  Diagnosis Date  . Hypertension   . Asthma   . Arthritis   . Hep C w/o coma, chronic   . Type 2 diabetes mellitus   . Hyperlipemia   . Depression     Past Surgical History  Procedure Laterality Date  . Cholecystectomy    . Appendectomy    . Abdominal hysterectomy    . Breast surgery    . Colonoscopy N/A 07/03/2014    treated/removed as above    There were no vitals filed for this visit.  Visit Diagnosis:  Weakness of right upper extremity  Stiffness of joint, shoulder region, right  Pain in joint, shoulder region, right      Subjective Assessment - 03/24/15 1000    Subjective  S: My shoulder is hurting so bad, I don't even know what to do.    Currently in Pain? Yes   Pain Score 10-Worst pain ever   Pain Location Shoulder   Pain Orientation Right   Pain Descriptors / Indicators Aching   Pain Type Acute pain            [facility] OT Assessment - 03/25/15 0001    Assessment   Diagnosis Right shoulder pain   Precautions   Precautions None                  OT Treatments/Exercises (OP) - 03/25/15 0001    Exercises   Exercises  Shoulder   Shoulder Exercises: ROM/Strengthening   Proximal Shoulder Strengthening, Supine 15X no rest breaks   Rhythmic Stabilization, Supine 15 seconds at 90 degrees flexion, 120 degrees flexion, and 70 degrees flexion with mod difficulty maintaining latter 2 positions   Modalities   Modalities Electrical Stimulation;Moist Heat   Moist Heat Therapy   Number Minutes Moist Heat 15 Minutes   Moist Heat Location Shoulder  right   Electrical Stimulation   Electrical Stimulation Location right shoulder   Electrical Stimulation Action 7.8 CV   Electrical Stimulation Parameters interferiential    Electrical Stimulation Goals Pain   Manual Therapy   Manual Therapy Myofascial release   Myofascial Release Myofascial release to right upper arm, deltoid, trapezius, and scapularis regions to decrease pain and fascial restrictions and increase joint mobility.   Manual cervical traction and MFR to SCM and upper trapezius.                OT Education - 03/25/15 1511    Education provided Yes   Education Details educated on how to obtain a home estim machine   Person(s) Educated Patient   Methods Explanation;Handout  Comprehension Verbalized understanding          OT Short Term Goals - 03/19/15 1111    OT SHORT TERM GOAL #1   Title Patient will be educated and instructed on HEP.    Time 3   Period Weeks   OT SHORT TERM GOAL #2   Title Patient will increase AROM to WNL to increase ability to complete overhead tasks with less difficulty.    Time 3   Period Weeks   OT SHORT TERM GOAL #3   Title Patient will increase RUE strength to 4/5 to increase ability to complete work related tasks.    Time 3   Period Weeks   Status On-going   OT SHORT TERM GOAL #4   Title Patient will decrease fascial restrctions to min amount.    Time 3   Period Weeks   OT SHORT TERM GOAL #5   Title Patient will increase pain level in RUE to 4/10 during daily tasks.    Time 3   Period Weeks   Status  On-going           OT Long Term Goals - 03/19/15 1112    OT LONG TERM GOAL #1   Title Patient will return to highest level of independence with daily and work related tasks.    Time 6   Period Weeks   Status On-going   OT LONG TERM GOAL #2   Title Patient will increase RUE strenght to 4+/5 to increase ability to complete household chores.    Time 6   Period Weeks   Status On-going   OT LONG TERM GOAL #3   Title Patient will decrease fascial restrictions to zero.   Time 6   Period Weeks   Status On-going   OT LONG TERM GOAL #4   Title Patient will decrease pain to 2/10 or less when completing daily tasks.    Time 6   Period Weeks   Status On-going               Plan - 03/25/15 1512    Clinical Impression Statement A: Patient with less pain this date.  Less pain with PROM if arm distracted during ranging.  Added rhythmic stabilization and patient had difficulty maintaing positioning.  Discussed surgical treatment for arthritis - patient has previously discussed with MD.   Plan P:  Recertification, reassess, add 1# to supine exercises.         Problem List Patient Active Problem List   Diagnosis Date Noted  . Headache 11/20/2014  . Dizziness 11/20/2014  . Hepatic cirrhosis 11/05/2014  . Chest pain 11/05/2014  . Dysphagia 11/05/2014  . History of hepatitis C 11/05/2014  . Sleep apnea, obstructive 01/17/2013  . Paroxysmal atrial tachycardia 01/17/2013  . HTN (hypertension) 01/17/2013  . Morbid obesity 01/17/2013  . Chest pain at rest 01/17/2013  . Difficulty in walking(719.7) 02/17/2012  . Bilateral leg weakness 02/17/2012  . Back pain 02/16/2012  . Bilateral anterior knee pain 02/16/2012  . Knee pain, left 02/16/2012    Vangie Bicker, OTR/L [phone]  03/25/2015, 3:14 PM  [place], [place], [address] Phone: [phone]   Fax:  [phone]

## 2015-03-26 ENCOUNTER — Encounter (HOSPITAL_COMMUNITY): Payer: Self-pay

## 2015-03-27 ENCOUNTER — Encounter (HOSPITAL_COMMUNITY): Payer: Self-pay | Admitting: Occupational Therapy

## 2015-04-01 ENCOUNTER — Ambulatory Visit (HOSPITAL_COMMUNITY): Payer: Commercial Managed Care - HMO

## 2015-04-01 ENCOUNTER — Encounter (HOSPITAL_COMMUNITY): Payer: Self-pay

## 2015-04-01 DIAGNOSIS — M25611 Stiffness of right shoulder, not elsewhere classified: Secondary | ICD-10-CM | POA: Diagnosis not present

## 2015-04-01 DIAGNOSIS — M629 Disorder of muscle, unspecified: Secondary | ICD-10-CM | POA: Diagnosis not present

## 2015-04-01 DIAGNOSIS — R29898 Other symptoms and signs involving the musculoskeletal system: Secondary | ICD-10-CM | POA: Diagnosis not present

## 2015-04-01 DIAGNOSIS — M25511 Pain in right shoulder: Secondary | ICD-10-CM

## 2015-04-01 DIAGNOSIS — M6289 Other specified disorders of muscle: Secondary | ICD-10-CM

## 2015-04-01 NOTE — Therapy (Addendum)
[place], [place], [address] Phone: [phone]   Fax:  [phone]  Occupational Therapy Treatment  Patient Details  Name: Megan Odom MRN: [id number] Date of Birth: 06/04/1946 Referring Provider:  Rosita Fire, MD  Encounter Date: 04/01/2015      OT End of Session - 04/01/15 1421    Visit Number 11   Number of Visits 12   Date for OT Re-Evaluation 05/31/15  mini reassess: 04/29/15   Authorization Type [employer] (HMO)   Authorization Time Period before 16th visit   Authorization - Visit Number 10   Authorization - Number of Visits 16   OT Start Time 1300   OT Stop Time 1355   OT Time Calculation (min) 55 min   Activity Tolerance Patient tolerated treatment well   Behavior During Therapy [facility] for tasks assessed/performed      Past Medical History  Diagnosis Date  . Hypertension   . Asthma   . Arthritis   . Hep C w/o coma, chronic   . Type 2 diabetes mellitus   . Hyperlipemia   . Depression     Past Surgical History  Procedure Laterality Date  . Cholecystectomy    . Appendectomy    . Abdominal hysterectomy    . Breast surgery    . Colonoscopy N/A 07/03/2014    treated/removed as above    There were no vitals filed for this visit.  Visit Diagnosis:  Pain in joint, shoulder region, right - Plan: Ot plan of care cert/re-cert  Tight fascia - Plan: Ot plan of care cert/re-cert  Weakness of right upper extremity - Plan: Ot plan of care cert/re-cert  Stiffness of joint, shoulder region, right - Plan: Ot plan of care cert/re-cert      Subjective Assessment - 04/01/15 1328    Subjective  S: MY shoulde just hurts all the time.    Currently in Pain? Yes   Pain Score 5    Pain Location Shoulder   Pain Orientation Right   Pain Descriptors / Indicators Aching   Pain Type Chronic pain            [facility] OT Assessment - 04/01/15 1330    Assessment   Diagnosis Right shoulder pain    Precautions   Precautions None   AROM   Overall AROM Comments Assessed seated. IR/ER adducted   AROM Assessment Site Shoulder   Right/Left Shoulder Right   Right Shoulder Flexion 180 Degrees  same at last progress note   Right Shoulder ABduction 180 Degrees  same at last progress note   Right Shoulder Internal Rotation 90 Degrees  same at last progress note   Right Shoulder External Rotation 90 Degrees  same at last progress note   PROM   Overall PROM  Within functional limits for tasks performed   PROM Assessment Site Shoulder   Right/Left Shoulder Right   Strength   Overall Strength Comments Assessed seated. IR/ER adducted.   Strength Assessment Site Shoulder   Right/Left Shoulder Right   Right Shoulder Flexion 4-/5  same at last progress note   Right Shoulder ABduction 4+/5  last progress note: 3/5   Right Shoulder Internal Rotation 3+/5  last progress note: 3/5   Right Shoulder External Rotation 4+/5  last progress note: 4+/5                  OT Treatments/Exercises (OP) - 04/01/15 1330    Exercises  Exercises Shoulder   Shoulder Exercises: Supine   Protraction PROM;5 reps;Strengthening;12 reps   Protraction Weight (lbs) 1   Horizontal ABduction PROM;5 reps;Strengthening;12 reps   Horizontal ABduction Weight (lbs) 1   External Rotation PROM;5 reps;Strengthening;12 reps   External Rotation Weight (lbs) 1   Internal Rotation PROM;5 reps;Strengthening;12 reps   Internal Rotation Weight (lbs) 1   Flexion PROM;5 reps;Strengthening;12 reps   Shoulder Flexion Weight (lbs) 1   ABduction PROM;5 reps;Strengthening;12 reps   Shoulder ABduction Weight (lbs) 1   Modalities   Modalities Electrical Stimulation;Moist Heat   Moist Heat Therapy   Number Minutes Moist Heat 10 Minutes   Moist Heat Location Shoulder   Electrical Stimulation   Electrical Stimulation Location right shoulder   Electrical Stimulation Action 6.8 CV   Electrical Stimulation Parameters  Intereferential   Electrical Stimulation Goals Pain   Manual Therapy   Manual Therapy Myofascial release   Myofascial Release Myofascial release to right upper arm, deltoid, trapezius, and scapularis regions to decrease pain and fascial restrictions and increase joint mobility.   Manual cervical traction and MFR to SCM and upper trapezius.                  OT Short Term Goals - 04/01/15 1345    OT SHORT TERM GOAL #1   Title Patient will be educated and instructed on HEP.    Time 3   Period Weeks   OT SHORT TERM GOAL #2   Title Patient will increase AROM to WNL to increase ability to complete overhead tasks with less difficulty.    Time 3   Period Weeks   OT SHORT TERM GOAL #3   Title Patient will increase RUE strength to 4/5 to increase ability to complete work related tasks.    Time 3   Period Weeks   Status Partially Met   OT SHORT TERM GOAL #4   Title Patient will decrease fascial restrctions to min amount.    Time 3   Period Weeks   OT SHORT TERM GOAL #5   Title Patient will increase pain level in RUE to 4/10 during daily tasks.    Time 3   Period Weeks   Status On-going           OT Long Term Goals - 04/01/15 1346    OT LONG TERM GOAL #1   Title Patient will return to highest level of independence with daily and work related tasks.    Time 6   Period Weeks   Status On-going   OT LONG TERM GOAL #2   Title Patient will increase RUE strenght to 4+/5 to increase ability to complete household chores.    Time 6   Period Weeks   Status On-going   OT LONG TERM GOAL #3   Title Patient will decrease fascial restrictions to zero.   Time 6   Period Weeks   Status On-going   OT LONG TERM GOAL #4   Title Patient will decrease pain to 2/10 or less when completing daily tasks.    Time 6   Period Weeks   Status On-going               Plan - 04/01/15 1422    Clinical Impression Statement A: Reassessment completed this date. patient has met all STGs  with the exception of pain. Patient overall as made progress in therapy in relation to AROM and strengthening. Pain continues to have major impact on her ability to  complete daily and work related tasks. Pain is present during RUE movements and at times when she is motionless. ES and moist heat has shown to help provide temporary pain relief although not long term.    Plan P:Cont therapy 2x a week for 4 more weeks. Increase repetitions to 15 times during supine. Complete strengthening seated. Update HEP if needed.   Consulted and Agree with Plan of Care Patient          G-Codes - 04-23-15 1628    Functional Assessment Tool Used FOTO score: 65/100 (35% impaired)   Functional Limitation Carrying, moving and handling objects   Carrying, Moving and Handling Objects Current Status [phone]) At least 20 percent but less than 40 percent impaired, limited or restricted   Carrying, Moving and Handling Objects Goal Status (S8864) At least 1 percent but less than 20 percent impaired, limited or restricted      Problem List Patient Active Problem List   Diagnosis Date Noted  . Headache 11/20/2014  . Dizziness 11/20/2014  . Hepatic cirrhosis 11/05/2014  . Chest pain 11/05/2014  . Dysphagia 11/05/2014  . History of hepatitis C 11/05/2014  . Sleep apnea, obstructive 01/17/2013  . Paroxysmal atrial tachycardia 01/17/2013  . HTN (hypertension) 01/17/2013  . Morbid obesity 01/17/2013  . Chest pain at rest 01/17/2013  . Difficulty in walking(719.7) 02/17/2012  . Bilateral leg weakness 02/17/2012  . Back pain 02/16/2012  . Bilateral anterior knee pain 02/16/2012  . Knee pain, left 02/16/2012   Occupational Therapy Progress Note  Dates of Reporting Period:  to 2015-04-23  Objective Reports of Subjective Statement: See Clinical impression statement  Objective Measurements: See measurements above  Goal Update: None. Patient has met all STGs except pain and a partly met strength goal.   Plan: Cont  therapy for 2x a week for 4 more weeks.   Reason Skilled Services are Required: Skilled OT services required to increase functional performance during daily tasks using RUE.   Ailene Ravel, OTR/L,CBIS  [phone]  2015/04/23, 4:41 PM  [facility] [address] [place], [place], [address] Phone: [phone]   Fax:  [phone]

## 2015-04-02 ENCOUNTER — Other Ambulatory Visit: Payer: Self-pay | Admitting: Internal Medicine

## 2015-04-02 ENCOUNTER — Encounter (HOSPITAL_COMMUNITY): Payer: Self-pay

## 2015-04-02 ENCOUNTER — Ambulatory Visit (HOSPITAL_COMMUNITY): Payer: Commercial Managed Care - HMO

## 2015-04-02 DIAGNOSIS — M629 Disorder of muscle, unspecified: Secondary | ICD-10-CM

## 2015-04-02 DIAGNOSIS — M25511 Pain in right shoulder: Secondary | ICD-10-CM

## 2015-04-02 DIAGNOSIS — M25611 Stiffness of right shoulder, not elsewhere classified: Secondary | ICD-10-CM | POA: Diagnosis not present

## 2015-04-02 DIAGNOSIS — M6289 Other specified disorders of muscle: Secondary | ICD-10-CM

## 2015-04-02 DIAGNOSIS — R29898 Other symptoms and signs involving the musculoskeletal system: Secondary | ICD-10-CM

## 2015-04-02 NOTE — Telephone Encounter (Signed)
REFILL 

## 2015-04-02 NOTE — Therapy (Signed)
[place], [place], [address] Phone: [phone]   Fax:  [phone]  Occupational Therapy Treatment  Patient Details  Name: Megan Odom MRN: [id number] Date of Birth: 08/19/1946 Referring Provider:  Marilynne Drivers, FNP  Encounter Date: 04/02/2015      OT End of Session - 04/02/15 1527    Visit Number 12   Number of Visits 20   Date for OT Re-Evaluation 05/31/15  mini reassess: 04/29/15   Authorization Type [employer] (HMO)   Authorization Time Period before 16th visit   Authorization - Visit Number 11   Authorization - Number of Visits 16   OT Start Time 1350   OT Stop Time 1430   OT Time Calculation (min) 40 min   Activity Tolerance Patient tolerated treatment well   Behavior During Therapy [facility] for tasks assessed/performed      Past Medical History  Diagnosis Date  . Hypertension   . Asthma   . Arthritis   . Hep C w/o coma, chronic   . Type 2 diabetes mellitus   . Hyperlipemia   . Depression     Past Surgical History  Procedure Laterality Date  . Cholecystectomy    . Appendectomy    . Abdominal hysterectomy    . Breast surgery    . Colonoscopy N/A 07/03/2014    treated/removed as above    There were no vitals filed for this visit.  Visit Diagnosis:  Pain in joint, shoulder region, right  Tight fascia  Weakness of right upper extremity      Subjective Assessment - 04/02/15 1351    Subjective  S: My shoulder hurts when I lay on it.    Currently in Pain? Yes   Pain Score 4    Pain Location Shoulder   Pain Orientation Right   Pain Descriptors / Indicators Aching   Pain Type Chronic pain            [facility] OT Assessment - 04/02/15 1352    Assessment   Diagnosis Right shoulder pain   Precautions   Precautions None                  OT Treatments/Exercises (OP) - 04/02/15 0001    Exercises   Exercises Shoulder   Shoulder Exercises: Supine   Protraction PROM;5 reps;Strengthening;12 reps   Protraction Weight (lbs) 2   Horizontal ABduction PROM;5 reps;Strengthening;12 reps   Horizontal ABduction Weight (lbs) 2   External Rotation PROM;5 reps;Strengthening;12 reps   External Rotation Weight (lbs) 2   Internal Rotation PROM;5 reps;Strengthening;12 reps   Internal Rotation Weight (lbs) 2   Flexion PROM;5 reps;Strengthening;12 reps   Shoulder Flexion Weight (lbs) 2   ABduction PROM;5 reps;Strengthening;12 reps   Shoulder ABduction Weight (lbs) 2   Shoulder Exercises: ROM/Strengthening   UBE (Upper Arm Bike) Level 1 2' forward 2' reverse   Manual Therapy   Manual Therapy Myofascial release   Myofascial Release Myofascial release to right upper arm, deltoid, trapezius, and scapularis regions to decrease pain and fascial restrictions and increase joint mobility.   Manual cervical traction and MFR to SCM and upper trapezius.                OT Education - 04/02/15 1533    Education provided Yes   Education Details Education on changing sleeping posture (supine) and using wedge or pillos to adjust sleeping posture.   Person(s) Educated Patient   Methods Explanation  Comprehension Verbalized understanding          OT Short Term Goals - 04/10/15 1345    OT SHORT TERM GOAL #1   Title Patient will be educated and instructed on HEP.    Time 3   Period Weeks   OT SHORT TERM GOAL #2   Title Patient will increase AROM to WNL to increase ability to complete overhead tasks with less difficulty.    Time 3   Period Weeks   OT SHORT TERM GOAL #3   Title Patient will increase RUE strength to 4/5 to increase ability to complete work related tasks.    Time 3   Period Weeks   Status Partially Met   OT SHORT TERM GOAL #4   Title Patient will decrease fascial restrctions to min amount.    Time 3   Period Weeks   OT SHORT TERM GOAL #5   Title Patient will increase pain level in RUE to 4/10 during daily tasks.    Time 3    Period Weeks   Status On-going           OT Long Term Goals - 04-10-2015 1346    OT LONG TERM GOAL #1   Title Patient will return to highest level of independence with daily and work related tasks.    Time 6   Period Weeks   Status On-going   OT LONG TERM GOAL #2   Title Patient will increase RUE strenght to 4+/5 to increase ability to complete household chores.    Time 6   Period Weeks   Status On-going   OT LONG TERM GOAL #3   Title Patient will decrease fascial restrictions to zero.   Time 6   Period Weeks   Status On-going   OT LONG TERM GOAL #4   Title Patient will decrease pain to 2/10 or less when completing daily tasks.    Time 6   Period Weeks   Status On-going               Plan - 04/02/15 1528    Clinical Impression Statement A: pt reports that the TENS unit and heat helped her a lot yesterday although when she woke up this morning the pain had returned. Pain was caused by sleeping position. Pt states that when she sleeps on either side she will have pain in her shoulder. Discussed different sleeping positions to try (supine) and also various wedges or pillows to use to prevent pain. informed patient that if her sleeping position is what is causing her pain we need to change that otherwise her pain will always be there and will not go away.    Plan P: Complete strengthening seated/standing. Update HEP (scapular theraband)          G-Codes - 04/10/15 1628    Functional Assessment Tool Used FOTO score: 65/100 (35% impaired)   Functional Limitation Carrying, moving and handling objects   Carrying, Moving and Handling Objects Current Status (B7169) At least 20 percent but less than 40 percent impaired, limited or restricted   Carrying, Moving and Handling Objects Goal Status (C7893) At least 1 percent but less than 20 percent impaired, limited or restricted      Problem List Patient Active Problem List   Diagnosis Date Noted  . Headache 11/20/2014  .  Dizziness 11/20/2014  . Hepatic cirrhosis 11/05/2014  . Chest pain 11/05/2014  . Dysphagia 11/05/2014  . History of hepatitis C 11/05/2014  . Sleep  apnea, obstructive 01/17/2013  . Paroxysmal atrial tachycardia 01/17/2013  . HTN (hypertension) 01/17/2013  . Morbid obesity 01/17/2013  . Chest pain at rest 01/17/2013  . Difficulty in walking(719.7) 02/17/2012  . Bilateral leg weakness 02/17/2012  . Back pain 02/16/2012  . Bilateral anterior knee pain 02/16/2012  . Knee pain, left 02/16/2012    Ailene Ravel, OTR/L,CBIS  [phone]  04/02/2015, 3:36 PM  [facility] [address] [place], [place], [address] Phone: [phone]   Fax:  [phone]

## 2015-04-08 ENCOUNTER — Encounter (HOSPITAL_COMMUNITY): Payer: Commercial Managed Care - HMO | Admitting: Specialist

## 2015-04-10 ENCOUNTER — Encounter (HOSPITAL_COMMUNITY): Payer: Commercial Managed Care - HMO

## 2015-04-15 ENCOUNTER — Other Ambulatory Visit: Payer: Self-pay | Admitting: Pharmacist

## 2015-04-15 NOTE — Patient Outreach (Signed)
[facility]) Care Management  [place]   04/15/2015  Megan Odom 1946/05/25 [id number]  Subjective: Megan Odom is a 69 year old female who was referred to [facility]ist from [facility] for medication review due to high-cost pharmacy per internal [employer] report.  Chart was reviewed.  Patient's medication fill list was obtained from [facility].    Objective:   Current Medications: Current Outpatient Prescriptions  Medication Sig Dispense Refill  . albuterol (PROVENTIL HFA;VENTOLIN HFA) 108 (90 BASE) MCG/ACT inhaler Inhale 2 puffs into the lungs every 6 (six) hours as needed. Shortness of breath     . albuterol (PROVENTIL) (2.5 MG/3ML) 0.083% nebulizer solution Take 2.5 mg by nebulization 3 (three) times daily as needed for shortness of breath. For shortness of breath    . aspirin EC 81 MG tablet Take 81 mg by mouth daily.      . beclomethasone (QVAR) 40 MCG/ACT inhaler Inhale 1 puff into the lungs 2 (two) times daily as needed (shortness of breath).     . benzonatate (TESSALON) 100 MG capsule Take 200 mg by mouth 3 (three) times daily as needed for cough.    . cephALEXin (KEFLEX) 500 MG capsule Take 1 capsule (500 mg total) by mouth 3 (three) times daily. 21 capsule 0  . dextromethorphan-guaiFENesin (MUCINEX DM) 30-600 MG per 12 hr tablet Take 1 tablet by mouth every 12 (twelve) hours. (Patient not taking: Reported on 12/09/2014) 14 tablet 1  . ergocalciferol (VITAMIN D2) 50000 UNITS capsule Take 50,000 Units by mouth once a week. sunday    . fluticasone (FLONASE) 50 MCG/ACT nasal spray Place 2 sprays into the nose daily as needed for allergies.     . furosemide (LASIX) 40 MG tablet Take 40 mg by mouth daily.      Marland Kitchen ibuprofen (ADVIL,MOTRIN) 800 MG tablet Take 800 mg by mouth every 8 (eight) hours as needed for mild pain.     Marland Kitchen latanoprost (XALATAN) 0.005 % ophthalmic solution Place 1 drop into the right eye at bedtime.    Marland Kitchen loratadine (CLARITIN) 10 MG  tablet Take 10 mg by mouth daily.     Marland Kitchen losartan-hydrochlorothiazide (HYZAAR) 100-25 MG per tablet Take 1 tablet by mouth daily.    . Omega-3 Fatty Acids (OMEGA-3 FISH OIL) 1200 MG CAPS Take 1,200 mg by mouth every morning.    Marland Kitchen omeprazole (PRILOSEC) 40 MG capsule Take 40 mg by mouth daily.     . ondansetron (ZOFRAN ODT) 8 MG disintegrating tablet Take 1 tablet (8 mg total) by mouth every 8 (eight) hours as needed for nausea or vomiting. 12 tablet 0  . PARoxetine (PAXIL) 20 MG tablet Take 20 mg by mouth 2 (two) times daily.    . potassium chloride (K-DUR) 10 MEQ tablet Take 10 mEq by mouth daily.    . verapamil (VERELAN PM) 180 MG 24 hr capsule Take 1 capsule (180 mg total) by mouth at bedtime. NEED OV. 90 capsule 0  . zolpidem (AMBIEN) 10 MG tablet Take 1 tablet (10 mg total) by mouth at bedtime as needed for sleep. 10 tablet 0   No current facility-administered medications for this visit.   Allergies  Allergen Reactions  . Darvocet [Propoxyphene N-Acetaminophen] Other (See Comments)    Hurts ears and side of face   . Latex Swelling  . Other Other (See Comments)    Hairspray(isoaplus)-causes big soars in head  . Penicillins Other (See Comments)    Hurts ears and side of  face   . Tetracyclines & Related Other (See Comments)    Hurts ears and side of face  . Adhesive [Tape] Itching and Rash    Telemetry pads cause itching and rash at site   Patient Active Problem List   Diagnosis Date Noted  . Headache 11/20/2014  . Dizziness 11/20/2014  . Hepatic cirrhosis 11/05/2014  . Chest pain 11/05/2014  . Dysphagia 11/05/2014  . History of hepatitis C 11/05/2014  . Sleep apnea, obstructive 01/17/2013  . Paroxysmal atrial tachycardia 01/17/2013  . HTN (hypertension) 01/17/2013  . Morbid obesity 01/17/2013  . Chest pain at rest 01/17/2013  . Difficulty in walking(719.7) 02/17/2012  . Bilateral leg weakness 02/17/2012  . Back pain 02/16/2012  . Bilateral anterior knee pain 02/16/2012  .  Knee pain, left 02/16/2012   Assessment:  Drugs sorted by system:  Neurologic/Psychologic: paroxetine, zolpidem  Cardiovascular: aspirin, furosemide, losartan/hydrochlorothiazide, omega 3-fatty acids,  potassium chloride, verapamil  Pulmonary/Allergy:  albuterol HFA and nebulizer solution, beclomethasone (QVAR) 52mcg/act, benzonatate, fluticasone, loratadine  Gastrointestinal: omeprazole, ondansetron (one time fill)  Endocrine: none reported  Renal: none reported  Topical: latanoprost ophthalmic solution  Pain: ibuprofen 800mg  Q8H PRN, ketorolac (one time fill), tramadol 37.5mg /acetaminophen 325mg  (last filled 01/08/15)  Miscellaneous: ergocalciferol 50,000 units, cephalexin (one time fill)  Duplications in therapy: none Gaps in therapy: None noted Medications to avoid in the elderly: None noted Drug interactions: -Aspirin and ibuprofen: may result in decreased antiplatelet effect of aspirin  -Aspirin, ibuprofen and paroxetine: may result in increased risk of bleeding Other issues noted:  1. Medication cost:  Per review of medication fill list, it appears patient was previously prescribed Harvoni for hepatitis C. Last fill was 05/28/2014.  Average wholesale price of a one month supply of Harvoni is $37,800.  Therefore, Harvoni was likely the biggest contributor to overall pharmacy cost.  Per patient's fill history and review of chart, patient has completed course of Harvoni.   Plan: 1. Will alert [facility] Management regarding findings of the medication reconciliation.  Harvoni was likely biggest contributor to pharmacy cost, and patient has now completed course of Harvoni.   2. Would use caution with combination of aspirin and ibuprofen in conjunction with paroxetine due to increased risk of bleeding.  Patient is on PPI, which lowers risk of GI bleed.  3. Ibuprofen can interfere with the irreversible platelet inhibition effects of aspirin via competition at the platelet  COX-1 binding site.  This interaction may lead to increase the risk of adverse thrombotic events in patients who receive daily low-dose aspirin as preventative therapy.  Aspirin should be administered at least 30 minutes before ibuprofen or at least 8 hours after ibuprofen.     Elisabeth Most, Pharm.D. Pharmacy Resident [place]

## 2015-04-20 ENCOUNTER — Ambulatory Visit (HOSPITAL_COMMUNITY): Payer: Commercial Managed Care - HMO | Admitting: Specialist

## 2015-04-20 DIAGNOSIS — M629 Disorder of muscle, unspecified: Secondary | ICD-10-CM

## 2015-04-20 DIAGNOSIS — R29898 Other symptoms and signs involving the musculoskeletal system: Secondary | ICD-10-CM | POA: Diagnosis not present

## 2015-04-20 DIAGNOSIS — M6289 Other specified disorders of muscle: Secondary | ICD-10-CM

## 2015-04-20 DIAGNOSIS — M25511 Pain in right shoulder: Secondary | ICD-10-CM

## 2015-04-20 DIAGNOSIS — M25611 Stiffness of right shoulder, not elsewhere classified: Secondary | ICD-10-CM | POA: Diagnosis not present

## 2015-04-20 NOTE — Therapy (Signed)
[place], [place], [address] Phone: [phone]   Fax:  [phone]  Occupational Therapy Treatment  Patient Details  Name: Megan Odom MRN: [id number] Date of Birth: 16-Jul-1946 Referring Provider:  Rosita Fire, MD  Encounter Date: 04/20/2015      OT End of Session - 04/20/15 1217    Visit Number 13   Number of Visits 20   Date for OT Re-Evaluation 05/31/15  mini reassess 04/29/15   Authorization Type [employer] (HMO)   Authorization Time Period before 16th visit   Authorization - Visit Number 13   Authorization - Number of Visits 16   OT Start Time [phone]   OT Stop Time 0945   OT Time Calculation (min) 54 min   Activity Tolerance Patient tolerated treatment well   Behavior During Therapy [facility] for tasks assessed/performed      Past Medical History  Diagnosis Date  . Hypertension   . Asthma   . Arthritis   . Hep C w/o coma, chronic   . Type 2 diabetes mellitus   . Hyperlipemia   . Depression     Past Surgical History  Procedure Laterality Date  . Cholecystectomy    . Appendectomy    . Abdominal hysterectomy    . Breast surgery    . Colonoscopy N/A 07/03/2014    treated/removed as above    There were no vitals filed for this visit.  Visit Diagnosis:  Pain in joint, shoulder region, right  Tight fascia      Subjective Assessment - 04/20/15 0852    Subjective  S:  Its getting better.  It hasnt hurt in awhile but it did hurt last night and this morning.  My arm just feels weak.   Currently in Pain? Yes   Pain Score 6    Pain Location Shoulder   Pain Orientation Right   Pain Descriptors / Indicators Aching   Pain Type Chronic pain            [facility] OT Assessment - 04/20/15 0001    Assessment   Diagnosis Right shoulder pain   Precautions   Precautions None                  OT Treatments/Exercises (OP) - 04/20/15 0001    Exercises   Exercises Shoulder   Shoulder Exercises: Supine   Protraction Strengthening;15 reps   Protraction Weight (lbs) 2   Horizontal ABduction Strengthening;15 reps   Horizontal ABduction Weight (lbs) 2   External Rotation Strengthening;15 reps   External Rotation Weight (lbs) 2   Internal Rotation Strengthening;15 reps   Internal Rotation Weight (lbs) 2   Flexion Strengthening;15 reps   Shoulder Flexion Weight (lbs) 2   ABduction Strengthening;15 reps   Shoulder ABduction Weight (lbs) 2   Shoulder Exercises: Seated   Protraction Strengthening;15 reps   Protraction Weight (lbs) 2   Horizontal ABduction Strengthening;15 reps   Horizontal ABduction Weight (lbs) 2   External Rotation Strengthening;15 reps   External Rotation Weight (lbs) 2   Internal Rotation Strengthening;15 reps   Internal Rotation Weight (lbs) 2   Flexion Strengthening;15 reps   Flexion Weight (lbs) 2   Abduction Strengthening;15 reps   ABduction Weight (lbs) 2   Shoulder Exercises: Standing   External Rotation Theraband;10 reps   Theraband Level (Shoulder External Rotation) Level 2 (Red)   Internal Rotation Theraband;10 reps   Theraband Level (Shoulder Internal Rotation) Level 2 (Red)  Extension Theraband;10 reps   Theraband Level (Shoulder Extension) Level 2 (Red)   Row Theraband;10 reps   Theraband Level (Shoulder Row) Level 2 (Red)   Retraction Theraband;12 reps   Theraband Level (Shoulder Retraction) Level 2 (Red)   Shoulder Exercises: ROM/Strengthening   Proximal Shoulder Strengthening, Supine 15 times with 2# without rest breaks   Modalities   Modalities Electrical Stimulation;Moist Heat   Moist Heat Therapy   Number Minutes Moist Heat 15 Minutes   Moist Heat Location Shoulder   Electrical Stimulation   Electrical Stimulation Location right shoulder   Electrical Stimulation Action 8.7 CV   Electrical Stimulation Parameters interferential   Electrical Stimulation Goals Pain   Manual Therapy   Manual Therapy Myofascial  release   Myofascial Release Myofascial release to right upper arm, deltoid, trapezius, and scapularis regions to decrease pain and fascial restrictions and increase joint mobility.   Manual cervical traction and MFR to SCM and upper trapezius.                OT Education - 04/20/15 1217    Education provided Yes   Education Details scapular theraband for improved posture and humeral alignment   Person(s) Educated Patient   Methods Explanation   Comprehension Verbalized understanding          OT Short Term Goals - 04/01/15 1345    OT SHORT TERM GOAL #1   Title Patient will be educated and instructed on HEP.    Time 3   Period Weeks   OT SHORT TERM GOAL #2   Title Patient will increase AROM to WNL to increase ability to complete overhead tasks with less difficulty.    Time 3   Period Weeks   OT SHORT TERM GOAL #3   Title Patient will increase RUE strength to 4/5 to increase ability to complete work related tasks.    Time 3   Period Weeks   Status Partially Met   OT SHORT TERM GOAL #4   Title Patient will decrease fascial restrctions to min amount.    Time 3   Period Weeks   OT SHORT TERM GOAL #5   Title Patient will increase pain level in RUE to 4/10 during daily tasks.    Time 3   Period Weeks   Status On-going           OT Long Term Goals - 04/01/15 1346    OT LONG TERM GOAL #1   Title Patient will return to highest level of independence with daily and work related tasks.    Time 6   Period Weeks   Status On-going   OT LONG TERM GOAL #2   Title Patient will increase RUE strenght to 4+/5 to increase ability to complete household chores.    Time 6   Period Weeks   Status On-going   OT LONG TERM GOAL #3   Title Patient will decrease fascial restrictions to zero.   Time 6   Period Weeks   Status On-going   OT LONG TERM GOAL #4   Title Patient will decrease pain to 2/10 or less when completing daily tasks.    Time 6   Period Weeks   Status On-going                Plan - 04/20/15 1218    Clinical Impression Statement A:  Patient with full range in supine and seated this date.  Completed strengthening with 2# resistance, good form in supine, min  vg for positioning in seated.  educated on scapular theraband for HEP.   Plan P: Follow up on HEP, add prone strengthening exercises for improved scapular stability and alignment.        Problem List Patient Active Problem List   Diagnosis Date Noted  . Headache 11/20/2014  . Dizziness 11/20/2014  . Hepatic cirrhosis 11/05/2014  . Chest pain 11/05/2014  . Dysphagia 11/05/2014  . History of hepatitis C 11/05/2014  . Sleep apnea, obstructive 01/17/2013  . Paroxysmal atrial tachycardia 01/17/2013  . HTN (hypertension) 01/17/2013  . Morbid obesity 01/17/2013  . Chest pain at rest 01/17/2013  . Difficulty in walking(719.7) 02/17/2012  . Bilateral leg weakness 02/17/2012  . Back pain 02/16/2012  . Bilateral anterior knee pain 02/16/2012  . Knee pain, left 02/16/2012    Vangie Bicker, OTR/L [phone]  04/20/2015, 12:25 PM  [place], [place], [address] Phone: [phone]   Fax:  [phone]

## 2015-04-20 NOTE — Patient Instructions (Signed)
COMPLETE EACH EXERCISE 10 -15 TIMES EACH 2 TIMES PER DAY  (Home) Extension: Isometric / Bilateral Arm Retraction - Sitting   Facing anchor, hold hands and elbow at shoulder height, with elbow bent.  Pull arms back to squeeze shoulder blades together. Repeat 10-15 times.  Copyright  VHI. All rights reserved.   (Home) Retraction: Row - Bilateral (Anchor)   Facing anchor, arms reaching forward, pull hands toward stomach, keeping elbows bent and at your sides and pinching shoulder blades together. Repeat 10-15 times.  Copyright  VHI. All rights reserved.   (Clinic) Extension / Flexion (Assist)   Face anchor, pull arms back, keeping elbow straight, and squeze shoulder blades together. Repeat 10-15 times.   Copyright  VHI. All rights reserved.  Strengthening: Resisted Internal Rotation   Hold tubing in left hand, elbow at side and forearm out. Rotate forearm in across body. Repeat ____ times per set. Do ____ sets per session. Do ____ sessions per day.  http://orth.exer.us/831   Copyright  VHI. All rights reserved.  Strengthening: Resisted External Rotation   Hold tubing in right hand, elbow at side and forearm across body. Rotate forearm out. Repeat ____ times per set. Do ____ sets per session. Do ____ sessions per day.  http://orth.exer.us/829   Copyright  VHI. All rights reserved.

## 2015-04-21 ENCOUNTER — Ambulatory Visit (HOSPITAL_COMMUNITY): Payer: Commercial Managed Care - HMO | Admitting: Specialist

## 2015-04-21 VITALS — BP 179/84

## 2015-04-21 DIAGNOSIS — M629 Disorder of muscle, unspecified: Secondary | ICD-10-CM | POA: Diagnosis not present

## 2015-04-21 DIAGNOSIS — M25611 Stiffness of right shoulder, not elsewhere classified: Secondary | ICD-10-CM | POA: Diagnosis not present

## 2015-04-21 DIAGNOSIS — M25511 Pain in right shoulder: Secondary | ICD-10-CM

## 2015-04-21 DIAGNOSIS — R29898 Other symptoms and signs involving the musculoskeletal system: Secondary | ICD-10-CM | POA: Diagnosis not present

## 2015-04-21 NOTE — Therapy (Signed)
[place], [place], [address] Phone: [phone]   Fax:  [phone]  Occupational Therapy Treatment  Patient Details  Name: Megan Odom MRN: [id number] Date of Birth: June 28, 1946 Referring Provider:  Marilynne Drivers, FNP  Encounter Date: 04/21/2015      OT End of Session - 04/21/15 1113    Visit Number 14   Number of Visits 20   Date for OT Re-Evaluation 05/31/15  04/29/15 mini reasses   Authorization Type [employer] (HMO)   Authorization Time Period before 16th visit   Authorization - Visit Number 14   Authorization - Number of Visits 16   OT Start Time 1111   OT Stop Time 1150   OT Time Calculation (min) 39 min   Activity Tolerance Patient tolerated treatment well   Behavior During Therapy [facility] for tasks assessed/performed      Past Medical History  Diagnosis Date  . Hypertension   . Asthma   . Arthritis   . Hep C w/o coma, chronic   . Type 2 diabetes mellitus   . Hyperlipemia   . Depression     Past Surgical History  Procedure Laterality Date  . Cholecystectomy    . Appendectomy    . Abdominal hysterectomy    . Breast surgery    . Colonoscopy N/A 07/03/2014    treated/removed as above    Filed Vitals:   04/21/15 1112  BP: 179/84    Visit Diagnosis:  Pain in joint, shoulder region, right  Weakness of right upper extremity      Subjective Assessment - 04/21/15 1113    Subjective  S:  I feel better today, but my head hurts.   Currently in Pain? Yes   Pain Score 5    Pain Location Shoulder   Pain Orientation Right   Pain Descriptors / Indicators Aching   Pain Type Chronic pain            [facility] OT Assessment - 04/21/15 0001    Assessment   Diagnosis Right shoulder pain   Precautions   Precautions None                  OT Treatments/Exercises (OP) - 04/21/15 0001    Exercises   Exercises Shoulder   Shoulder Exercises: Supine   Protraction  Strengthening;15 reps   Protraction Weight (lbs) 2   Horizontal ABduction Strengthening;15 reps   Horizontal ABduction Weight (lbs) 2   External Rotation Strengthening;15 reps   External Rotation Weight (lbs) 2   Internal Rotation Strengthening;15 reps   Internal Rotation Weight (lbs) 2   Flexion Strengthening;15 reps   Shoulder Flexion Weight (lbs) 2   ABduction Strengthening;15 reps   Shoulder ABduction Weight (lbs) 2   Shoulder Exercises: Prone   Retraction Strengthening;10 reps   Retraction Weight (lbs) 2   Flexion Strengthening;10 reps   Flexion Weight (lbs) 2   Extension Strengthening;10 reps   Extension Weight (lbs) 2   External Rotation Strengthening;10 reps   External Rotation Weight (lbs) 2   Internal Rotation Strengthening;10 reps   Internal Rotation Weight (lbs) 2   Horizontal ABduction 1 Strengthening;10 reps   Horizontal ABduction 1 Weight (lbs) 2   Horizontal ABduction 2 Strengthening;10 reps   Horizontal ABduction 2 Weight (lbs) 2   Shoulder Exercises: ROM/Strengthening   Cybex Press 1.5 plate;15 reps   Cybex Row 1.5 plate;15 reps   Other ROM/Strengthening Exercises Nustep 5 minutes   Manual  Therapy   Manual Therapy Myofascial release   Myofascial Release Myofascial release to right upper arm, deltoid, trapezius, and scapularis regions to decrease pain and fascial restrictions and increase joint mobility.                 OT Education - 04/20/15 1217    Education provided Yes   Education Details scapular theraband for improved posture and humeral alignment   Person(s) Educated Patient   Methods Explanation   Comprehension Verbalized understanding          OT Short Term Goals - 04/01/15 1345    OT SHORT TERM GOAL #1   Title Patient will be educated and instructed on HEP.    Time 3   Period Weeks   OT SHORT TERM GOAL #2   Title Patient will increase AROM to WNL to increase ability to complete overhead tasks with less difficulty.    Time 3    Period Weeks   OT SHORT TERM GOAL #3   Title Patient will increase RUE strength to 4/5 to increase ability to complete work related tasks.    Time 3   Period Weeks   Status Partially Met   OT SHORT TERM GOAL #4   Title Patient will decrease fascial restrctions to min amount.    Time 3   Period Weeks   OT SHORT TERM GOAL #5   Title Patient will increase pain level in RUE to 4/10 during daily tasks.    Time 3   Period Weeks   Status On-going           OT Long Term Goals - 04/01/15 1346    OT LONG TERM GOAL #1   Title Patient will return to highest level of independence with daily and work related tasks.    Time 6   Period Weeks   Status On-going   OT LONG TERM GOAL #2   Title Patient will increase RUE strenght to 4+/5 to increase ability to complete household chores.    Time 6   Period Weeks   Status On-going   OT LONG TERM GOAL #3   Title Patient will decrease fascial restrictions to zero.   Time 6   Period Weeks   Status On-going   OT LONG TERM GOAL #4   Title Patient will decrease pain to 2/10 or less when completing daily tasks.    Time 6   Period Weeks   Status On-going               Plan - 04/21/15 1153    Clinical Impression Statement A:  Patient completed prone strengthening in modified positoin this date.     Plan P:  Increase repetitions with prone strengthening.    Consulted and Agree with Plan of Care Patient        Problem List Patient Active Problem List   Diagnosis Date Noted  . Headache 11/20/2014  . Dizziness 11/20/2014  . Hepatic cirrhosis 11/05/2014  . Chest pain 11/05/2014  . Dysphagia 11/05/2014  . History of hepatitis C 11/05/2014  . Sleep apnea, obstructive 01/17/2013  . Paroxysmal atrial tachycardia 01/17/2013  . HTN (hypertension) 01/17/2013  . Morbid obesity 01/17/2013  . Chest pain at rest 01/17/2013  . Difficulty in walking(719.7) 02/17/2012  . Bilateral leg weakness 02/17/2012  . Back pain 02/16/2012  . Bilateral  anterior knee pain 02/16/2012  . Knee pain, left 02/16/2012    Shirlean Mylar, OTR/L [phone]  04/21/2015, 11:56 AM  [place], [place], [address] Phone: [phone]   Fax:  [phone]

## 2015-04-29 ENCOUNTER — Encounter (HOSPITAL_COMMUNITY): Payer: Self-pay | Admitting: Occupational Therapy

## 2015-04-29 ENCOUNTER — Ambulatory Visit (HOSPITAL_COMMUNITY): Payer: Commercial Managed Care - HMO | Attending: Family Medicine | Admitting: Occupational Therapy

## 2015-04-29 DIAGNOSIS — M25511 Pain in right shoulder: Secondary | ICD-10-CM | POA: Insufficient documentation

## 2015-04-29 DIAGNOSIS — R29898 Other symptoms and signs involving the musculoskeletal system: Secondary | ICD-10-CM | POA: Insufficient documentation

## 2015-04-29 DIAGNOSIS — M629 Disorder of muscle, unspecified: Secondary | ICD-10-CM | POA: Diagnosis not present

## 2015-04-29 DIAGNOSIS — M6289 Other specified disorders of muscle: Secondary | ICD-10-CM

## 2015-04-29 DIAGNOSIS — M25611 Stiffness of right shoulder, not elsewhere classified: Secondary | ICD-10-CM | POA: Insufficient documentation

## 2015-04-29 NOTE — Therapy (Signed)
[place], [place], [address] Phone: [phone]   Fax:  [phone]  Occupational Therapy Reassessment & Treatment  Patient Details  Name: Megan Odom MRN: [id number] Date of Birth: [birth year] Referring Provider:  Marilynne Drivers, FNP  Encounter Date: 04/29/2015      OT End of Session - 04/29/15 1313    Visit Number 15   Number of Visits 20   Date for OT Re-Evaluation 05/31/15   Authorization Type [employer] (HMO)   Authorization Time Period before 16th visit   Authorization - Visit Number 15   Authorization - Number of Visits 16   OT Start Time 1103   OT Stop Time 1200   OT Time Calculation (min) 57 min   Activity Tolerance Patient tolerated treatment well   Behavior During Therapy [facility] for tasks assessed/performed      Past Medical History  Diagnosis Date  . Hypertension   . Asthma   . Arthritis   . Hep C w/o coma, chronic   . Type 2 diabetes mellitus   . Hyperlipemia   . Depression     Past Surgical History  Procedure Laterality Date  . Cholecystectomy    . Appendectomy    . Abdominal hysterectomy    . Breast surgery    . Colonoscopy N/A 07/03/2014    treated/removed as above    There were no vitals filed for this visit.  Visit Diagnosis:  Pain in joint, shoulder region, right  Weakness of right upper extremity  Tight fascia  Stiffness of joint, shoulder region, right      Subjective Assessment - 04/29/15 1149    Subjective  S: I still have some work to do to get this arm back where it needs to be.    Currently in Pain? Yes   Pain Score 4    Pain Location Shoulder   Pain Orientation Right   Pain Descriptors / Indicators Aching   Pain Type Chronic pain           [facility] OT Assessment - 04/29/15 1103    Assessment   Diagnosis Right shoulder pain   Precautions   Precautions None   AROM   Overall AROM Comments Assessed seated. IR/ER adducted   AROM Assessment  Site Shoulder   Right/Left Shoulder Right   Right Shoulder Flexion 180 Degrees  same as previous   Right Shoulder ABduction 180 Degrees  same as previous   Right Shoulder Internal Rotation 90 Degrees  same as previous   Right Shoulder External Rotation 90 Degrees  same as previous   PROM   Overall PROM  Within functional limits for tasks performed   PROM Assessment Site Shoulder   Right/Left Shoulder Right   Strength   Overall Strength Comments Assessed seated. IR/ER adducted.   Strength Assessment Site Shoulder   Right/Left Shoulder Right   Right Shoulder Flexion 4/5  previous 4-/5   Right Shoulder ABduction 4+/5  same as previous   Right Shoulder Internal Rotation 4/5  3+/5 previous   Right Shoulder External Rotation 4+/5  same as previous                  OT Treatments/Exercises (OP) - 04/29/15 1105    Exercises   Exercises Shoulder   Shoulder Exercises: Supine   Protraction Strengthening;15 reps   Protraction Weight (lbs) 2   Horizontal ABduction Strengthening;15 reps   Horizontal ABduction Weight (lbs) 2   External Rotation  Strengthening;15 reps   External Rotation Weight (lbs) 2   Internal Rotation Strengthening;15 reps   Internal Rotation Weight (lbs) 2   Flexion Strengthening;15 reps   Shoulder Flexion Weight (lbs) 2   ABduction Strengthening;15 reps   Shoulder ABduction Weight (lbs) 2   Shoulder Exercises: Seated   Protraction Strengthening;15 reps   Protraction Weight (lbs) 2   Horizontal ABduction Strengthening;15 reps   Horizontal ABduction Weight (lbs) 2   External Rotation Strengthening;15 reps   External Rotation Weight (lbs) 2   Internal Rotation Strengthening;15 reps   Internal Rotation Weight (lbs) 2   Flexion Strengthening;15 reps   Flexion Weight (lbs) 2   Abduction Strengthening;15 reps   ABduction Weight (lbs) 2   Shoulder Exercises: Prone   Retraction Strengthening;10 reps   Retraction Weight (lbs) 2   Flexion  Strengthening;10 reps   Flexion Weight (lbs) 2   Extension Strengthening;10 reps   Extension Weight (lbs) 2   External Rotation Strengthening;10 reps   External Rotation Weight (lbs) 2   Internal Rotation Strengthening;10 reps   Internal Rotation Weight (lbs) 2   Horizontal ABduction 1 Strengthening;10 reps   Horizontal ABduction 1 Weight (lbs) 2   Horizontal ABduction 2 Strengthening;10 reps   Horizontal ABduction 2 Weight (lbs) 2   Modalities   Modalities Electrical Stimulation;Moist Heat   Moist Heat Therapy   Number Minutes Moist Heat 15 Minutes   Moist Heat Location Shoulder   Electrical Stimulation   Electrical Stimulation Location right shoulder   Electrical Stimulation Action 6.8 CV   Electrical Stimulation Parameters interferential   Electrical Stimulation Goals Pain   Manual Therapy   Manual Therapy Myofascial release   Myofascial Release Myofascial release to right upper arm, deltoid, trapezius, and scapularis regions to decrease pain and fascial restrictions and increase joint mobility.                  OT Short Term Goals - 04/29/15 1131    OT SHORT TERM GOAL #1   Title Patient will be educated and instructed on HEP.    Time 3   Period Weeks   OT SHORT TERM GOAL #2   Title Patient will increase AROM to WNL to increase ability to complete overhead tasks with less difficulty.    Time 3   Period Weeks   OT SHORT TERM GOAL #3   Title Patient will increase RUE strength to 4/5 to increase ability to complete work related tasks.    Time 3   Period Weeks   Status On-going   OT SHORT TERM GOAL #4   Title Patient will decrease fascial restrctions to min amount.    Time 3   Period Weeks   OT SHORT TERM GOAL #5   Title Patient will increase pain level in RUE to 4/10 during daily tasks.    Time 3   Period Weeks   Status Achieved           OT Long Term Goals - 04/29/15 1132    OT LONG TERM GOAL #1   Title Patient will return to highest level of  independence with daily and work related tasks.    Time 6   Period Weeks   Status On-going   OT LONG TERM GOAL #2   Title Patient will increase RUE strenght to 4+/5 to increase ability to complete household chores.    Time 6   Period Weeks   Status Partially Met   OT LONG TERM GOAL #3  Title Patient will decrease fascial restrictions to zero.   Time 6   Period Weeks   Status On-going   OT LONG TERM GOAL #4   Title Patient will decrease pain to 2/10 or less when completing daily tasks.    Time 6   Period Weeks   Status On-going               Plan - 04/29/15 1316    Clinical Impression Statement A: Mini-reassessment completed this date. Pt has met all STGs, has partially met 1/5 LTGs, and is progressing towards remaining LTGs. Pt completed strengthening exercises this session, reporting fatigue at end of session. Pt requested IFES for pain today.    Plan P: Continue working to increase strength and gain independence in functional use of RUE. Treatment plan: continue RUE strengthening exercises, scapular strengthening and stabilization exercises, working to decrease pain in RUE. Next session: Focus on proper form during strengthening exercises, increase prone repetitions.         Problem List Patient Active Problem List   Diagnosis Date Noted  . Headache 11/20/2014  . Dizziness 11/20/2014  . Hepatic cirrhosis 11/05/2014  . Chest pain 11/05/2014  . Dysphagia 11/05/2014  . History of hepatitis C 11/05/2014  . Sleep apnea, obstructive 01/17/2013  . Paroxysmal atrial tachycardia 01/17/2013  . HTN (hypertension) 01/17/2013  . Morbid obesity 01/17/2013  . Chest pain at rest 01/17/2013  . Difficulty in walking(719.7) 02/17/2012  . Bilateral leg weakness 02/17/2012  . Back pain 02/16/2012  . Bilateral anterior knee pain 02/16/2012  . Knee pain, left 02/16/2012    Guadelupe Sabin, OTR/L  [phone]  04/29/2015, 1:21 PM  [place] [address] [place], [place], [address] Phone: [phone]   Fax:  [phone]

## 2015-04-30 ENCOUNTER — Ambulatory Visit (HOSPITAL_COMMUNITY): Payer: Commercial Managed Care - HMO | Admitting: Occupational Therapy

## 2015-05-01 ENCOUNTER — Ambulatory Visit (HOSPITAL_COMMUNITY): Payer: Commercial Managed Care - HMO | Admitting: Occupational Therapy

## 2015-05-06 ENCOUNTER — Ambulatory Visit (HOSPITAL_COMMUNITY): Payer: Commercial Managed Care - HMO

## 2015-05-06 ENCOUNTER — Encounter (HOSPITAL_COMMUNITY): Payer: Self-pay

## 2015-05-06 DIAGNOSIS — M25511 Pain in right shoulder: Secondary | ICD-10-CM | POA: Diagnosis not present

## 2015-05-06 DIAGNOSIS — M629 Disorder of muscle, unspecified: Secondary | ICD-10-CM

## 2015-05-06 DIAGNOSIS — R29898 Other symptoms and signs involving the musculoskeletal system: Secondary | ICD-10-CM

## 2015-05-06 DIAGNOSIS — M25611 Stiffness of right shoulder, not elsewhere classified: Secondary | ICD-10-CM | POA: Diagnosis not present

## 2015-05-06 DIAGNOSIS — M6289 Other specified disorders of muscle: Secondary | ICD-10-CM

## 2015-05-06 NOTE — Therapy (Signed)
[place], [place], [address] Phone: [phone]   Fax:  [phone]  Occupational Therapy Treatment  Patient Details  Name: Megan Odom MRN: [id number] Date of Birth: October 01, 1945 Referring Provider:  Marilynne Drivers, FNP  Encounter Date: 05/06/2015      OT End of Session - 05/06/15 1652    Visit Number 16   Number of Visits 20   Date for OT Re-Evaluation 05/31/15   Authorization Type [employer] (HMO)   Authorization Time Period before 26th visit   Authorization - Visit Number 16   Authorization - Number of Visits 26   OT Start Time 1352   OT Stop Time 1430   OT Time Calculation (min) 38 min   Activity Tolerance Patient tolerated treatment well   Behavior During Therapy [facility] for tasks assessed/performed      Past Medical History  Diagnosis Date  . Hypertension   . Asthma   . Arthritis   . Hep C w/o coma, chronic   . Type 2 diabetes mellitus   . Hyperlipemia   . Depression     Past Surgical History  Procedure Laterality Date  . Cholecystectomy    . Appendectomy    . Abdominal hysterectomy    . Breast surgery    . Colonoscopy N/A 07/03/2014    treated/removed as above    There were no vitals filed for this visit.  Visit Diagnosis:  Pain in joint, shoulder region, right  Tight fascia  Weakness of right upper extremity      Subjective Assessment - 05/06/15 1411    Subjective  S: They usually find a knot on my neck.   Currently in Pain? Yes   Pain Score 5    Pain Location Shoulder   Pain Orientation Right   Pain Descriptors / Indicators Aching   Pain Type Chronic pain            [facility] OT Assessment - 05/06/15 1417    Assessment   Diagnosis Right shoulder pain   Precautions   Precautions None                  OT Treatments/Exercises (OP) - 05/06/15 1418    Exercises   Exercises Shoulder   Shoulder Exercises: Supine   Protraction Strengthening;15  reps   Protraction Weight (lbs) 2   Horizontal ABduction Strengthening;15 reps   Horizontal ABduction Weight (lbs) 2   External Rotation Strengthening;15 reps   External Rotation Weight (lbs) 2   Internal Rotation Strengthening;15 reps   Internal Rotation Weight (lbs) 2   Flexion Strengthening;15 reps   Shoulder Flexion Weight (lbs) 2   ABduction Strengthening;15 reps   Shoulder ABduction Weight (lbs) 2   Shoulder Exercises: Prone   Retraction Strengthening;12 reps   Retraction Weight (lbs) 2   Flexion Strengthening;12 reps   Flexion Weight (lbs) 2   Extension Strengthening;12 reps   Extension Weight (lbs) 2   External Rotation Strengthening;12 reps   External Rotation Weight (lbs) 2   Internal Rotation Strengthening;12 reps   Internal Rotation Weight (lbs) 2   Horizontal ABduction 1 Strengthening;12 reps   Horizontal ABduction 1 Weight (lbs) 2   Horizontal ABduction 2 Strengthening;12 reps   Horizontal ABduction 2 Weight (lbs) 2   Shoulder Exercises: ROM/Strengthening   Cybex Press 1.5 plate;15 reps   Cybex Row 1.5 plate;15 reps   Proximal Shoulder Strengthening, Supine 15 times with 2# without rest breaks   Manual  Therapy   Manual Therapy Myofascial release   Myofascial Release Myofascial release to right upper arm, deltoid, trapezius, and scapularis regions to decrease pain and fascial restrictions and increase joint mobility.                   OT Short Term Goals - 05-28-2015 1655    OT SHORT TERM GOAL #1   Title Patient will be educated and instructed on HEP.    Time 3   Period Weeks   OT SHORT TERM GOAL #2   Title Patient will increase AROM to WNL to increase ability to complete overhead tasks with less difficulty.    Time 3   Period Weeks   OT SHORT TERM GOAL #3   Title Patient will increase RUE strength to 4/5 to increase ability to complete work related tasks.    Time 3   Period Weeks   Status On-going   OT SHORT TERM GOAL #4   Title Patient will  decrease fascial restrctions to min amount.    Time 3   Period Weeks   OT SHORT TERM GOAL #5   Title Patient will increase pain level in RUE to 4/10 during daily tasks.    Time 3   Period Weeks           OT Long Term Goals - 04/29/15 1132    OT LONG TERM GOAL #1   Title Patient will return to highest level of independence with daily and work related tasks.    Time 6   Period Weeks   Status On-going   OT LONG TERM GOAL #2   Title Patient will increase RUE strenght to 4+/5 to increase ability to complete household chores.    Time 6   Period Weeks   Status Partially Met   OT LONG TERM GOAL #3   Title Patient will decrease fascial restrictions to zero.   Time 6   Period Weeks   Status On-going   OT LONG TERM GOAL #4   Title Patient will decrease pain to 2/10 or less when completing daily tasks.    Time 6   Period Weeks   Status On-going               Plan - 05-28-2015 1653    Clinical Impression Statement A: G code updated this date. Patient required min verbal cues for proper form this date during prone exercises. Increased repetitions for prone excercises. pt completed all exercises with report of pain during supine exercises.    Plan P: Cont to work on increasing independence with proper form. Add ball on the wall.           G-Codes - May 28, 2015 1655    Functional Assessment Tool Used FOTO score: 73/100 (27% impaired)   Functional Limitation Carrying, moving and handling objects   Carrying, Moving and Handling Objects Current Status (H7416) At least 20 percent but less than 40 percent impaired, limited or restricted   Carrying, Moving and Handling Objects Goal Status (L8453) At least 1 percent but less than 20 percent impaired, limited or restricted      Problem List Patient Active Problem List   Diagnosis Date Noted  . Headache 11/20/2014  . Dizziness 11/20/2014  . Hepatic cirrhosis 11/05/2014  . Chest pain 11/05/2014  . Dysphagia 11/05/2014  . History  of hepatitis C 11/05/2014  . Sleep apnea, obstructive 01/17/2013  . Paroxysmal atrial tachycardia 01/17/2013  . HTN (hypertension) 01/17/2013  . Morbid obesity  01/17/2013  . Chest pain at rest 01/17/2013  . Difficulty in walking(719.7) 02/17/2012  . Bilateral leg weakness 02/17/2012  . Back pain 02/16/2012  . Bilateral anterior knee pain 02/16/2012  . Knee pain, left 02/16/2012  Occupational Therapy Progress Note  Dates of Reporting Period: 04/01/15 to 05/06/15  Objective Reports of Subjective Statement: Patient has excellent ROM and functional strength although continues to report increased pain during any use of RUE.   Objective Measurements:  Overall AROM Comments Assessed seated. IR/ER adducted    AROM Assessment Site Shoulder   Right/Left Shoulder Right   Right Shoulder Flexion 180 Degrees  same as previous   Right Shoulder ABduction 180 Degrees  same as previous   Right Shoulder Internal Rotation 90 Degrees  same as previous   Right Shoulder External Rotation 90 Degrees  same as previous   PROM   Overall PROM  Within functional limits for tasks performed   PROM Assessment Site Shoulder   Right/Left Shoulder Right   Strength   Overall Strength Comments Assessed seated. IR/ER adducted.   Strength Assessment Site Shoulder   Right/Left Shoulder Right   Right Shoulder Flexion 4/5  previous 4-/5   Right Shoulder ABduction 4+/5  same as previous   Right Shoulder Internal Rotation 4/5  3+/5 previous   Right Shoulder External Rotation 4+/5  same as previous         Goal Update: Pt has met all STGs, has partially met 1/5 LTGs, and is progressing towards remaining LTGs.  Plan: continue RUE strengthening exercises, scapular strengthening and stabilization exercises, working to decrease pain in RUE  Reason Skilled Services are Required: Skilled OT services needed to increase functional performance during daily and work  tasks using right UE.   Ailene Ravel, OTR/L,CBIS  [phone]  05/06/2015, 4:57 PM  [place] [address] [place], [place], [address] Phone: [phone]   Fax:  [phone]

## 2015-05-11 ENCOUNTER — Telehealth: Payer: Self-pay | Admitting: Nurse Practitioner

## 2015-05-11 ENCOUNTER — Encounter (HOSPITAL_COMMUNITY): Payer: Self-pay

## 2015-05-11 ENCOUNTER — Encounter: Payer: Self-pay | Admitting: Nurse Practitioner

## 2015-05-11 ENCOUNTER — Emergency Department (HOSPITAL_COMMUNITY)
Admission: EM | Admit: 2015-05-11 | Discharge: 2015-05-11 | Disposition: A | Discharge status: Home / Self Care | Payer: Commercial Managed Care - HMO | Attending: Emergency Medicine | Admitting: Emergency Medicine

## 2015-05-11 ENCOUNTER — Emergency Department (HOSPITAL_COMMUNITY): Payer: Commercial Managed Care - HMO

## 2015-05-11 ENCOUNTER — Ambulatory Visit: Payer: Commercial Managed Care - HMO | Admitting: Nurse Practitioner

## 2015-05-11 DIAGNOSIS — F329 Major depressive disorder, single episode, unspecified: Secondary | ICD-10-CM | POA: Diagnosis not present

## 2015-05-11 DIAGNOSIS — I10 Essential (primary) hypertension: Secondary | ICD-10-CM | POA: Diagnosis not present

## 2015-05-11 DIAGNOSIS — J209 Acute bronchitis, unspecified: Secondary | ICD-10-CM | POA: Diagnosis not present

## 2015-05-11 DIAGNOSIS — Z91040 Latex allergy status: Secondary | ICD-10-CM | POA: Diagnosis not present

## 2015-05-11 DIAGNOSIS — Z8619 Personal history of other infectious and parasitic diseases: Secondary | ICD-10-CM | POA: Insufficient documentation

## 2015-05-11 DIAGNOSIS — R05 Cough: Secondary | ICD-10-CM | POA: Diagnosis not present

## 2015-05-11 DIAGNOSIS — J441 Chronic obstructive pulmonary disease with (acute) exacerbation: Secondary | ICD-10-CM | POA: Diagnosis not present

## 2015-05-11 DIAGNOSIS — F17210 Nicotine dependence, cigarettes, uncomplicated: Secondary | ICD-10-CM | POA: Diagnosis not present

## 2015-05-11 DIAGNOSIS — Z7982 Long term (current) use of aspirin: Secondary | ICD-10-CM | POA: Insufficient documentation

## 2015-05-11 DIAGNOSIS — Z792 Long term (current) use of antibiotics: Secondary | ICD-10-CM | POA: Insufficient documentation

## 2015-05-11 DIAGNOSIS — E119 Type 2 diabetes mellitus without complications: Secondary | ICD-10-CM | POA: Insufficient documentation

## 2015-05-11 DIAGNOSIS — Z880 Allergy status to penicillin: Secondary | ICD-10-CM | POA: Insufficient documentation

## 2015-05-11 DIAGNOSIS — Z79899 Other long term (current) drug therapy: Secondary | ICD-10-CM | POA: Insufficient documentation

## 2015-05-11 DIAGNOSIS — E669 Obesity, unspecified: Secondary | ICD-10-CM | POA: Insufficient documentation

## 2015-05-11 DIAGNOSIS — Z720 Tobacco use: Secondary | ICD-10-CM | POA: Insufficient documentation

## 2015-05-11 DIAGNOSIS — R079 Chest pain, unspecified: Secondary | ICD-10-CM | POA: Diagnosis not present

## 2015-05-11 DIAGNOSIS — R0602 Shortness of breath: Secondary | ICD-10-CM | POA: Diagnosis not present

## 2015-05-11 LAB — CBC
HCT: 37.1 % (ref 36.0–46.0)
Hemoglobin: 11.9 g/dL — ABNORMAL LOW (ref 12.0–15.0)
MCH: 29.2 pg (ref 26.0–34.0)
MCHC: 32.1 g/dL (ref 30.0–36.0)
MCV: 91.2 fL (ref 78.0–100.0)
Platelets: 268 10*3/uL (ref 150–400)
RBC: 4.07 MIL/uL (ref 3.87–5.11)
RDW: 12.6 % (ref 11.5–15.5)
WBC: 11.4 10*3/uL — ABNORMAL HIGH (ref 4.0–10.5)

## 2015-05-11 LAB — COMPREHENSIVE METABOLIC PANEL
ALT: 16 U/L (ref 14–54)
AST: 24 U/L (ref 15–41)
Albumin: 3.4 g/dL — ABNORMAL LOW (ref 3.5–5.0)
Alkaline Phosphatase: 120 U/L (ref 38–126)
Anion gap: 6 (ref 5–15)
BUN: 12 mg/dL (ref 6–20)
CO2: 26 mmol/L (ref 22–32)
Calcium: 8.5 mg/dL — ABNORMAL LOW (ref 8.9–10.3)
Chloride: 106 mmol/L (ref 101–111)
Creatinine, Ser: 0.8 mg/dL (ref 0.44–1.00)
GFR calc Af Amer: >60 mL/min (ref >60)
GFR calc non Af Amer: >60 mL/min (ref >60)
Glucose, Bld: 131 mg/dL — ABNORMAL HIGH (ref 65–99)
Potassium: 2.9 mmol/L — ABNORMAL LOW (ref 3.5–5.1)
Sodium: 138 mmol/L (ref 135–145)
Total Bilirubin: 0.4 mg/dL (ref 0.3–1.2)
Total Protein: 8.7 g/dL — ABNORMAL HIGH (ref 6.5–8.1)

## 2015-05-11 LAB — TROPONIN I: Troponin I: <0.03 ng/mL (ref <0.031)

## 2015-05-11 MED ORDER — PREDNISONE 50 MG PO TABS
60.0000 mg | ORAL_TABLET | Freq: Once | ORAL | Status: AC
  Administered 2015-05-11: 60 mg via ORAL
  Filled 2015-05-11: qty 1

## 2015-05-11 MED ORDER — DOXYCYCLINE HYCLATE 100 MG PO CAPS: 100.0000 mg | ORAL_CAPSULE | Freq: Two times a day (BID) | ORAL | Status: DC

## 2015-05-11 MED ORDER — ALBUTEROL (5 MG/ML) CONTINUOUS INHALATION SOLN
10.0000 mg/h | INHALATION_SOLUTION | Freq: Once | RESPIRATORY_TRACT | Status: AC
  Administered 2015-05-11: 10 mg/h via RESPIRATORY_TRACT
  Filled 2015-05-11: qty 20

## 2015-05-11 MED ORDER — PREDNISONE 20 MG PO TABS: 20.0000 mg | ORAL_TABLET | Freq: Two times a day (BID) | ORAL | Status: DC

## 2015-05-11 MED ORDER — GUAIFENESIN-CODEINE 100-10 MG/5ML PO SOLN: 10.0000 mL | Freq: Three times a day (TID) | ORAL | Status: DC | PRN

## 2015-05-11 MED ORDER — HYDROCODONE-ACETAMINOPHEN 7.5-325 MG/15ML PO SOLN
10.0000 mL | Freq: Once | ORAL | Status: AC
  Administered 2015-05-11: 10 mL via ORAL
  Filled 2015-05-11: qty 15

## 2015-05-11 MED ORDER — LEVOFLOXACIN 500 MG PO TABS: 500.0000 mg | ORAL_TABLET | Freq: Every day | ORAL | Status: DC

## 2015-05-11 MED ORDER — ALBUTEROL SULFATE HFA 108 (90 BASE) MCG/ACT IN AERS: 1.0000 | INHALATION_SPRAY | Freq: Four times a day (QID) | RESPIRATORY_TRACT | Status: DC | PRN

## 2015-05-11 NOTE — ED Notes (Signed)
Pt reports sob x 3 or 4 days.  Reports has had a nonproductive cough since aug.  Pt c/o chest pain.

## 2015-05-11 NOTE — Telephone Encounter (Signed)
PATIENT WAS A NO SHOW AND LETTER SENT  °

## 2015-05-11 NOTE — ED Provider Notes (Signed)
CSN: [id number]     Arrival date & time 05/11/15  1448 History   First MD Initiated Contact with Patient 05/11/15 1610     Chief Complaint  Patient presents with  . Shortness of Breath     HPI  Patient position evaluation of shortness of breath. States she's had 3-4 days of productive cough and wheezing not getting better with her albuterol at home. She hurts under her ribs when she called spray has no pain or pressure or anginal type pain. No fevers or chills. Dry nonproductive cough. No extremity swelling or other new symptoms for her.  Past Medical History  Diagnosis Date  . Hypertension   . Asthma   . Arthritis   . Hep C w/o coma, chronic   . Type 2 diabetes mellitus   . Hyperlipemia   . Depression    Past Surgical History  Procedure Laterality Date  . Cholecystectomy    . Appendectomy    . Abdominal hysterectomy    . Breast surgery    . Colonoscopy N/A 07/03/2014    treated/removed as above   Family History  Problem Relation Age of Onset  . Alcohol abuse Mother   . Early death Mother   . Early death Father   . Alcohol abuse Sister   . HIV Brother   . Diabetes Brother   . Heart disease Brother   . Hepatitis C Daughter   . Hypertension Son   . Cancer Maternal Aunt   . Hypertension Maternal Aunt   . Heart disease Maternal Aunt   . Cancer Maternal Uncle   . Hypertension Maternal Uncle   . Heart disease Brother   . Diabetes Brother    Social History  Substance Use Topics  . Smoking status: Current Every Day Smoker -- 0.50 packs/day for 50 years  . Smokeless tobacco: Never Used     Comment: one pack every 2-3 days  . Alcohol Use: No   OB History    Gravida Para Term Preterm AB TAB SAB Ectopic Multiple Living   4 2 2  2  2   2      Review of Systems  Constitutional: Negative for fever, chills, diaphoresis, appetite change and fatigue.  HENT: Negative for mouth sores, sore throat and trouble swallowing.   Eyes: Negative for visual disturbance.   Respiratory: Positive for cough, shortness of breath and wheezing. Negative for chest tightness.   Cardiovascular: Positive for chest pain.  Gastrointestinal: Negative for nausea, vomiting, abdominal pain, diarrhea and abdominal distention.  Endocrine: Negative for polydipsia, polyphagia and polyuria.  Genitourinary: Negative for dysuria, frequency and hematuria.  Musculoskeletal: Negative for gait problem.  Skin: Negative for color change, pallor and rash.  Neurological: Negative for dizziness, syncope, light-headedness and headaches.  Hematological: Does not bruise/bleed easily.  Psychiatric/Behavioral: Negative for behavioral problems and confusion.      Allergies  Darvocet; Latex; Other; Penicillins; Tetracyclines & related; and Adhesive  Home Medications   Prior to Admission medications   Medication Sig Start Date End Date Taking? Authorizing Provider  albuterol (PROVENTIL HFA;VENTOLIN HFA) 108 (90 BASE) MCG/ACT inhaler Inhale 1-2 puffs into the lungs every 6 (six) hours as needed for wheezing. 05/11/15   Tanna Furry, MD  aspirin EC 81 MG tablet Take 81 mg by mouth daily.      Historical Provider, MD  beclomethasone (QVAR) 40 MCG/ACT inhaler Inhale 1 puff into the lungs 2 (two) times daily as needed (shortness of breath).     Historical Provider,  MD  benzonatate (TESSALON) 100 MG capsule Take 200 mg by mouth 3 (three) times daily as needed for cough.    Historical Provider, MD  cephALEXin (KEFLEX) 500 MG capsule Take 1 capsule (500 mg total) by mouth 3 (three) times daily. 12/09/14   Jola Schmidt, MD  dextromethorphan-guaiFENesin [facility] DM) 30-600 MG per 12 hr tablet Take 1 tablet by mouth every 12 (twelve) hours. Patient not taking: Reported on 12/09/2014 05/23/13   Fredia Sorrow, MD  ergocalciferol (VITAMIN D2) 50000 UNITS capsule Take 50,000 Units by mouth once a week. sunday    Historical Provider, MD  fluticasone (FLONASE) 50 MCG/ACT nasal spray Place 2 sprays into the nose  daily as needed for allergies.     Historical Provider, MD  furosemide (LASIX) 40 MG tablet Take 40 mg by mouth daily.      Historical Provider, MD  guaiFENesin-codeine 100-10 MG/5ML syrup Take 10 mLs by mouth 3 (three) times daily as needed for cough. 05/11/15   Tanna Furry, MD  guaiFENesin-codeine 100-10 MG/5ML syrup Take 10 mLs by mouth 3 (three) times daily as needed for cough. 05/11/15   Tanna Furry, MD  ibuprofen (ADVIL,MOTRIN) 800 MG tablet Take 800 mg by mouth every 8 (eight) hours as needed for mild pain.     Historical Provider, MD  latanoprost (XALATAN) 0.005 % ophthalmic solution Place 1 drop into the right eye at bedtime.    Historical Provider, MD  loratadine (CLARITIN) 10 MG tablet Take 10 mg by mouth daily.     Historical Provider, MD  losartan-hydrochlorothiazide (HYZAAR) 100-25 MG per tablet Take 1 tablet by mouth daily.    Historical Provider, MD  Omega-3 Fatty Acids (OMEGA-3 FISH OIL) 1200 MG CAPS Take 1,200 mg by mouth every morning.    Historical Provider, MD  omeprazole (PRILOSEC) 40 MG capsule Take 40 mg by mouth daily.     Historical Provider, MD  ondansetron (ZOFRAN ODT) 8 MG disintegrating tablet Take 1 tablet (8 mg total) by mouth every 8 (eight) hours as needed for nausea or vomiting. 12/09/14   Jola Schmidt, MD  PARoxetine (PAXIL) 20 MG tablet Take 20 mg by mouth 2 (two) times daily.    Historical Provider, MD  potassium chloride (K-DUR) 10 MEQ tablet Take 10 mEq by mouth daily.    Historical Provider, MD  predniSONE (DELTASONE) 20 MG tablet Take 1 tablet (20 mg total) by mouth 2 (two) times daily with a meal. 05/11/15   Tanna Furry, MD  verapamil (VERELAN PM) 180 MG 24 hr capsule Take 1 capsule (180 mg total) by mouth at bedtime. NEED OV. 04/02/15   Pixie Casino, MD  zolpidem (AMBIEN) 10 MG tablet Take 1 tablet (10 mg total) by mouth at bedtime as needed for sleep. 11/27/14 12/27/14  Pixie Casino, MD   BP 130/65 mmHg  Pulse 70  Temp(Src) 97.9 F (36.6 C) (Oral)  Resp 20   Ht 5' 5.5" (1.664 m)  Wt 252 lb (114.306 kg)  BMI 41.28 kg/m2  SpO2 97% Physical Exam  Constitutional: She is oriented to person, place, and time. She appears well-developed and well-nourished. No distress.  Obese black female laying right side. Watching TV and changing the channels during my examination  HENT:  Head: Normocephalic.  Eyes: Conjunctivae are normal. Pupils are equal, round, and reactive to light. No scleral icterus.  Neck: Normal range of motion. Neck supple. No thyromegaly present.  Cardiovascular: Normal rate and regular rhythm.  Exam reveals no gallop and no friction  rub.   No murmur heard. Pulmonary/Chest: Effort normal and breath sounds normal. No respiratory distress. She has no wheezes. She has no rales.  Wheezing and prolongation in all fields. No asymmetry or focal diminished breath sounds.  Abdominal: Soft. Bowel sounds are normal. She exhibits no distension. There is no tenderness. There is no rebound.  Musculoskeletal: Normal range of motion.  Neurological: She is alert and oriented to person, place, and time.  Skin: Skin is warm and dry. No rash noted.  Psychiatric: She has a normal mood and affect. Her behavior is normal.    ED Course  Procedures (including critical care time) Labs Review Labs Reviewed  COMPREHENSIVE METABOLIC PANEL - Abnormal; Notable for the following:    Potassium 2.9 (*)    Glucose, Bld 131 (*)    Calcium 8.5 (*)    Total Protein 8.7 (*)    Albumin 3.4 (*)    All other components within normal limits  CBC - Abnormal; Notable for the following:    WBC 11.4 (*)    Hemoglobin 11.9 (*)    All other components within normal limits  TROPONIN I    Imaging Review Dg Chest 2 View  05/11/2015   CLINICAL DATA:  Shortness of breath for 3-4 days. Nonproductive cough since August. Chest pain.  EXAM: CHEST  2 VIEW  COMPARISON:  09/29/2014  FINDINGS: There is mild peribronchial thickening and interstitial prominence. Heart is normal size.  No confluent opacities. No effusions. No acute bony abnormality.  IMPRESSION: Mild interstitial prominence and peribronchial thickening likely reflects bronchitic changes.   Electronically Signed   By: Rolm Baptise M.D.   On: 05/11/2015 15:24   I have personally reviewed and evaluated these images and lab results as part of my medical decision-making.   EKG Interpretation   Date/Time:  Monday May 11 2015 14:54:49 EDT Ventricular Rate:  62 PR Interval:  196 QRS Duration: 114 QT Interval:  456 QTC Calculation: 462 R Axis:   -33 Text Interpretation:  Normal sinus rhythm Left axis deviation Incomplete  right bundle branch block Left ventricular hypertrophy with repolarization  abnormality Cannot rule out Septal infarct , age undetermined Abnormal ECG  No significant change since last tracing Confirmed by ZACKOWSKI  MD, [place]  [phone]) on 05/11/2015 3:01:36 PM      MDM   Final diagnoses:  Bronchitis with bronchospasm  COPD exacerbation    CRITICAL CARE Performed by: Lolita Patella   Total critical care time: 60 minutes continuous nebulized albuterol with 3 re-evaluations. Showing improvement with each eval.  Critical care time was exclusive of separately billable procedures and treating other patients.  Critical care was necessary to treat or prevent imminent or life-threatening deterioration.  Critical care was time spent personally by me on the following activities: development of treatment plan with patient and/or surrogate as well as nursing, discussions with consultants, evaluation of patient's response to treatment, examination of patient, obtaining history from patient or surrogate, ordering and performing treatments and interventions, ordering and review of laboratory studies, ordering and review of radiographic studies, pulse oximetry and re-evaluation of patient's condition.   Patient given 1 hour continuous nebulized albuterol. States she felt considerably  better. Chest x-ray shows bronchitic changes but no signs of CHF or focal infiltrate. I think she is appropriate for outpatient treatment. Continue broncho-dilators, antibiotics, prednisone, primary care follow-up if not improving.    Tanna Furry, MD 05/11/15 [phone]

## 2015-05-11 NOTE — ED Notes (Signed)
Pt alert & oriented x4, stable gait. Patient given discharge instructions, paperwork & prescription(s). Patient informed not to drive, operate any equipment & handel any important documents 4 hours after taking pain medication. Patient  instructed to stop at the registration desk to finish any additional paperwork. Patient  verbalized understanding. Pt left department w/ no further questions. 

## 2015-05-11 NOTE — Discharge Instructions (Signed)
Acute Bronchitis Bronchitis is inflammation of the airways that extend from the windpipe into the lungs (bronchi). The inflammation often causes mucus to develop. This leads to a cough, which is the most common symptom of bronchitis.  In acute bronchitis, the condition usually develops suddenly and goes away over time, usually in a couple weeks. Smoking, allergies, and asthma can make bronchitis worse. Repeated episodes of bronchitis may cause further lung problems.  CAUSES Acute bronchitis is most often caused by the same virus that causes a cold. The virus can spread from person to person (contagious) through coughing, sneezing, and touching contaminated objects. SIGNS AND SYMPTOMS   Cough.   Fever.   Coughing up mucus.   Body aches.   Chest congestion.   Chills.   Shortness of breath.   Sore throat.  DIAGNOSIS  Acute bronchitis is usually diagnosed through a physical exam. Your health care provider will also ask you questions about your medical history. Tests, such as chest X-rays, are sometimes done to rule out other conditions.  TREATMENT  Acute bronchitis usually goes away in a couple weeks. Oftentimes, no medical treatment is necessary. Medicines are sometimes given for relief of fever or cough. Antibiotic medicines are usually not needed but may be prescribed in certain situations. In some cases, an inhaler may be recommended to help reduce shortness of breath and control the cough. A cool mist vaporizer may also be used to help thin bronchial secretions and make it easier to clear the chest.  HOME CARE INSTRUCTIONS  Get plenty of rest.   Drink enough fluids to keep your urine clear or pale yellow (unless you have a medical condition that requires fluid restriction). Increasing fluids may help thin your respiratory secretions (sputum) and reduce chest congestion, and it will prevent dehydration.   Take medicines only as directed by your health care provider.  If  you were prescribed an antibiotic medicine, finish it all even if you start to feel better.  Avoid smoking and secondhand smoke. Exposure to cigarette smoke or irritating chemicals will make bronchitis worse. If you are a smoker, consider using nicotine gum or skin patches to help control withdrawal symptoms. Quitting smoking will help your lungs heal faster.   Reduce the chances of another bout of acute bronchitis by washing your hands frequently, avoiding people with cold symptoms, and trying not to touch your hands to your mouth, nose, or eyes.   Keep all follow-up visits as directed by your health care provider.  SEEK MEDICAL CARE IF: Your symptoms do not improve after 1 week of treatment.  SEEK IMMEDIATE MEDICAL CARE IF:  You develop an increased fever or chills.   You have chest pain.   You have severe shortness of breath.  You have bloody sputum.   You develop dehydration.  You faint or repeatedly feel like you are going to pass out.  You develop repeated vomiting.  You develop a severe headache. MAKE SURE YOU:   Understand these instructions.  Will watch your condition.  Will get help right away if you are not doing well or get worse. Document Released: 09/15/2004 Document Revised: 12/23/2013 Document Reviewed: 01/29/2013 [facility] Patient Information 2015 [place], [place]. This information is not intended to replace advice given to you by your health care provider. Make sure you discuss any questions you have with your health care provider.  Chronic Obstructive Pulmonary Disease Exacerbation Chronic obstructive pulmonary disease (COPD) is a common lung condition in which airflow from the lungs is limited.  COPD is a general term that can be used to describe many different lung problems that limit airflow, including chronic bronchitis and emphysema. COPD exacerbations are episodes when breathing symptoms become much worse and require extra treatment. Without treatment,  COPD exacerbations can be life threatening, and frequent COPD exacerbations can cause further damage to your lungs. CAUSES   Respiratory infections.   Exposure to smoke.   Exposure to air pollution, chemical fumes, or dust. Sometimes there is no apparent cause or trigger. RISK FACTORS  Smoking cigarettes.  Older age.  Frequent prior COPD exacerbations. SIGNS AND SYMPTOMS   Increased coughing.   Increased thick spit (sputum) production.   Increased wheezing.   Increased shortness of breath.   Rapid breathing.   Chest tightness. DIAGNOSIS  Your medical history, a physical exam, and tests will help your health care provider make a diagnosis. Tests may include:  A chest X-ray.  Basic lab tests.  Sputum testing.  An arterial blood gas test. TREATMENT  Depending on the severity of your COPD exacerbation, you may need to be admitted to a hospital for treatment. Some of the treatments commonly used to treat COPD exacerbations are:   Antibiotic medicines.   Bronchodilators. These are drugs that expand the air passages. They may be given with an inhaler or nebulizer. Spacer devices may be needed to help improve drug delivery.  Corticosteroid medicines.  Supplemental oxygen therapy.  HOME CARE INSTRUCTIONS   Do not smoke. Quitting smoking is very important to prevent COPD from getting worse and exacerbations from happening as often.  Avoid exposure to all substances that irritate the airway, especially to tobacco smoke.   If you were prescribed an antibiotic medicine, finish it all even if you start to feel better.  Take all medicines as directed by your health care provider.It is important to use correct technique with inhaled medicines.  Drink enough fluids to keep your urine clear or pale yellow (unless you have a medical condition that requires fluid restriction).  Use a cool mist vaporizer. This makes it easier to clear your chest when you cough.    If you have a home nebulizer and oxygen, continue to use them as directed.   Maintain all necessary vaccinations to prevent infections.   Exercise regularly.   Eat a healthy diet.   Keep all follow-up appointments as directed by your health care provider. SEEK IMMEDIATE MEDICAL CARE IF:  You have worsening shortness of breath.   You have trouble talking.   You have severe chest pain.  You have blood in your sputum.  You have a fever.  You have weakness, vomit repeatedly, or faint.   You feel confused.   You continue to get worse. MAKE SURE YOU:   Understand these instructions.  Will watch your condition.  Will get help right away if you are not doing well or get worse. Document Released: 06/05/2007 Document Revised: 12/23/2013 Document Reviewed: 04/12/2013 [facility] Patient Information 2015 [place], [place]. This information is not intended to replace advice given to you by your health care provider. Make sure you discuss any questions you have with your health care provider.

## 2015-05-13 ENCOUNTER — Encounter (HOSPITAL_COMMUNITY): Payer: Self-pay

## 2015-05-13 NOTE — Telephone Encounter (Signed)
Noted  

## 2015-05-14 ENCOUNTER — Encounter (HOSPITAL_COMMUNITY): Payer: Self-pay | Admitting: Occupational Therapy

## 2015-05-19 ENCOUNTER — Ambulatory Visit (HOSPITAL_COMMUNITY): Payer: Commercial Managed Care - HMO

## 2015-05-20 ENCOUNTER — Ambulatory Visit (HOSPITAL_COMMUNITY): Payer: Commercial Managed Care - HMO | Admitting: Specialist

## 2015-05-27 ENCOUNTER — Encounter (HOSPITAL_COMMUNITY): Payer: Self-pay | Admitting: Occupational Therapy

## 2015-05-27 ENCOUNTER — Ambulatory Visit (HOSPITAL_COMMUNITY): Payer: Commercial Managed Care - HMO | Attending: Family Medicine | Admitting: Occupational Therapy

## 2015-05-27 DIAGNOSIS — M6281 Muscle weakness (generalized): Secondary | ICD-10-CM | POA: Insufficient documentation

## 2015-05-27 NOTE — Therapy (Deleted)
[place], [place], [address] Phone: [phone]   Fax:  [phone]  Patient Details  Name: Megan Odom MRN: [id number] Date of Birth: [birth year] Referring Provider:  Rosita Fire, MD  Encounter Date: 05/27/2015  OCCUPATIONAL THERAPY DISCHARGE SUMMARY  Visits from Start of Care: 16  Current functional level related to goals / functional outcomes:  OT Short Term Goals - 05/06/15 1655    OT SHORT TERM GOAL #1   Title Patient will be educated and instructed on HEP.    Time 3   Period Weeks   OT SHORT TERM GOAL #2   Title Patient will increase AROM to WNL to increase ability to complete overhead tasks with less difficulty.    Time 3   Period Weeks   OT SHORT TERM GOAL #3   Title Patient will increase RUE strength to 4/5 to increase ability to complete work related tasks.    Time 3   Period Weeks   Status On-going   OT SHORT TERM GOAL #4   Title Patient will decrease fascial restrctions to min amount.    Time 3   Period Weeks   OT SHORT TERM GOAL #5   Title Patient will increase pain level in RUE to 4/10 during daily tasks.    Time 3   Period Weeks           OT Long Term Goals - 04/29/15 1132    OT LONG TERM GOAL #1   Title Patient will return to highest level of independence with daily and work related tasks.    Time 6   Period Weeks   Status On-going   OT LONG TERM GOAL #2   Title Patient will increase RUE strenght to 4+/5 to increase ability to complete household chores.    Time 6   Period Weeks   Status Partially Met   OT LONG TERM GOAL #3   Title Patient will decrease fascial restrictions to zero.   Time 6   Period Weeks   Status On-going   OT LONG TERM GOAL #4   Title Patient will decrease pain to 2/10 or less when completing daily tasks.    Time 6   Period Weeks    Status On-going          Remaining deficits:   Pt has not returned since last visit on 05/06/2015.   Pt has met 4/5 STGs and has partially met 1/4 LTGs during occupational therapy treatments. Pt will need new MD referral if she would like to continue therapy later.     Education / Equipment: N/A-no additional education given since last visit.   Plan: Patient agrees to discharge.  Patient goals were not met. Patient is being discharged due to not returning since the last visit.  ?????        Chryl Heck 05/27/2015, 2:49 PM  [place] [address] [place], [place], [address] Phone: [phone]   Fax:  [phone]

## 2015-05-27 NOTE — Therapy (Deleted)
[place], [place], [address] Phone: [phone]   Fax:  [phone]  Patient Details  Name: Megan Odom MRN: [id number] Date of Birth: Mar 15, 1946 Referring Provider:  Rosita Fire, MD  Encounter Date: 05/27/2015   OCCUPATIONAL THERAPY DISCHARGE SUMMARY  Visits from Start of Care: 16  Current functional level related to goals / functional outcomes: OT Short Term Goals - 05/06/15 1655    OT SHORT TERM GOAL #1   Title Patient will be educated and instructed on HEP.    Time 3   Period Weeks   OT SHORT TERM GOAL #2   Title Patient will increase AROM to WNL to increase ability to complete overhead tasks with less difficulty.    Time 3   Period Weeks   OT SHORT TERM GOAL #3   Title Patient will increase RUE strength to 4/5 to increase ability to complete work related tasks.    Time 3   Period Weeks   Status Not Met   OT SHORT TERM GOAL #4   Title Patient will decrease fascial restrctions to min amount.    Time 3   Period Weeks   OT SHORT TERM GOAL #5   Title Patient will increase pain level in RUE to 4/10 during daily tasks.    Time 3   Period Weeks           OT Long Term Goals - 04/29/15 1132    OT LONG TERM GOAL #1   Title Patient will return to highest level of independence with daily and work related tasks.    Time 6   Period Weeks   Status Not Met   OT LONG TERM GOAL #2   Title Patient will increase RUE strenght to 4+/5 to increase ability to complete household chores.    Time 6   Period Weeks   Status Partially Met   OT LONG TERM GOAL #3   Title Patient will decrease fascial restrictions to zero.   Time 6   Period Weeks   Status Not Met   OT LONG TERM GOAL #4   Title Patient will decrease pain to 2/10 or less when completing daily tasks.    Time 6   Period Weeks    Status Not Met          Remaining deficits: Pt has not returned since last visit on 05/06/2015.   Pt has met 4/5 STGs and has partially met 1/4 LTGs during occupational therapy treatments. Pt will need new MD referral if she would like to continue therapy later.     Education / Equipment: No additional education provided since last visit.   Plan: Patient agrees to discharge.  Patient goals were not met. Patient is being discharged due to not returning since the last visit.  ?????       Guadelupe Sabin, OTR/L  [phone]  05/27/2015, 2:56 PM  [place] [address] [place], [place], [address] Phone: [phone]   Fax:  [phone]

## 2015-05-28 ENCOUNTER — Encounter (HOSPITAL_COMMUNITY): Payer: Self-pay | Admitting: Occupational Therapy

## 2015-07-13 DIAGNOSIS — F17200 Nicotine dependence, unspecified, uncomplicated: Secondary | ICD-10-CM | POA: Diagnosis not present

## 2015-07-13 DIAGNOSIS — I10 Essential (primary) hypertension: Secondary | ICD-10-CM | POA: Diagnosis not present

## 2015-07-13 DIAGNOSIS — J449 Chronic obstructive pulmonary disease, unspecified: Secondary | ICD-10-CM | POA: Diagnosis not present

## 2015-07-31 ENCOUNTER — Other Ambulatory Visit: Payer: Self-pay | Admitting: *Deleted

## 2015-07-31 NOTE — Patient Outreach (Signed)
[facility]) Care Management  07/31/2015  Megan Odom Jan 12, 1946 [id number]  Referral from [place] 4 list:  Telephone call to patient; left HIPPA compliant voice mail requesting return call.  Plan: will follow up. [name], RN BSN [place] Management Coordinator [facility] Care Management  [phone]

## 2015-08-04 ENCOUNTER — Other Ambulatory Visit: Payer: Commercial Managed Care - HMO | Admitting: *Deleted

## 2015-08-04 NOTE — Patient Outreach (Signed)
[facility]) Care Management  08/04/2015  Megan Odom 23-Jan-1946 [id number]   Telephone call attempt x 2 ; left message with person who answered call to have client return call.   Plan: will follow up  [name], RN BSN [place] Management Coordinator [facility] Care Management  [phone]

## 2015-08-05 ENCOUNTER — Other Ambulatory Visit: Payer: Self-pay | Admitting: *Deleted

## 2015-08-05 ENCOUNTER — Encounter (HOSPITAL_COMMUNITY): Payer: Self-pay | Admitting: *Deleted

## 2015-08-05 ENCOUNTER — Emergency Department (HOSPITAL_COMMUNITY)
Admission: EM | Admit: 2015-08-05 | Discharge: 2015-08-05 | Disposition: A | Discharge status: Home / Self Care | Payer: Commercial Managed Care - HMO | Attending: Emergency Medicine | Admitting: Emergency Medicine

## 2015-08-05 ENCOUNTER — Emergency Department (HOSPITAL_COMMUNITY): Payer: Commercial Managed Care - HMO

## 2015-08-05 DIAGNOSIS — M1990 Unspecified osteoarthritis, unspecified site: Secondary | ICD-10-CM | POA: Diagnosis not present

## 2015-08-05 DIAGNOSIS — Z91040 Latex allergy status: Secondary | ICD-10-CM | POA: Insufficient documentation

## 2015-08-05 DIAGNOSIS — Z792 Long term (current) use of antibiotics: Secondary | ICD-10-CM | POA: Diagnosis not present

## 2015-08-05 DIAGNOSIS — J45901 Unspecified asthma with (acute) exacerbation: Secondary | ICD-10-CM | POA: Diagnosis not present

## 2015-08-05 DIAGNOSIS — Z7982 Long term (current) use of aspirin: Secondary | ICD-10-CM | POA: Insufficient documentation

## 2015-08-05 DIAGNOSIS — F17200 Nicotine dependence, unspecified, uncomplicated: Secondary | ICD-10-CM | POA: Diagnosis not present

## 2015-08-05 DIAGNOSIS — F329 Major depressive disorder, single episode, unspecified: Secondary | ICD-10-CM | POA: Insufficient documentation

## 2015-08-05 DIAGNOSIS — E785 Hyperlipidemia, unspecified: Secondary | ICD-10-CM | POA: Insufficient documentation

## 2015-08-05 DIAGNOSIS — J209 Acute bronchitis, unspecified: Secondary | ICD-10-CM

## 2015-08-05 DIAGNOSIS — Z79899 Other long term (current) drug therapy: Secondary | ICD-10-CM | POA: Insufficient documentation

## 2015-08-05 DIAGNOSIS — E119 Type 2 diabetes mellitus without complications: Secondary | ICD-10-CM | POA: Insufficient documentation

## 2015-08-05 DIAGNOSIS — Z7952 Long term (current) use of systemic steroids: Secondary | ICD-10-CM | POA: Insufficient documentation

## 2015-08-05 DIAGNOSIS — Z880 Allergy status to penicillin: Secondary | ICD-10-CM | POA: Diagnosis not present

## 2015-08-05 DIAGNOSIS — Z8619 Personal history of other infectious and parasitic diseases: Secondary | ICD-10-CM | POA: Diagnosis not present

## 2015-08-05 DIAGNOSIS — I10 Essential (primary) hypertension: Secondary | ICD-10-CM | POA: Diagnosis not present

## 2015-08-05 DIAGNOSIS — J9801 Acute bronchospasm: Secondary | ICD-10-CM | POA: Diagnosis not present

## 2015-08-05 DIAGNOSIS — R05 Cough: Secondary | ICD-10-CM | POA: Diagnosis not present

## 2015-08-05 DIAGNOSIS — R0602 Shortness of breath: Secondary | ICD-10-CM | POA: Diagnosis not present

## 2015-08-05 DIAGNOSIS — J40 Bronchitis, not specified as acute or chronic: Secondary | ICD-10-CM | POA: Diagnosis not present

## 2015-08-05 LAB — BASIC METABOLIC PANEL
Anion gap: 11 (ref 5–15)
BUN: 16 mg/dL (ref 6–20)
CO2: 25 mmol/L (ref 22–32)
Calcium: 8.9 mg/dL (ref 8.9–10.3)
Chloride: 100 mmol/L — ABNORMAL LOW (ref 101–111)
Creatinine, Ser: 0.86 mg/dL (ref 0.44–1.00)
GFR calc Af Amer: >60 mL/min (ref >60)
GFR calc non Af Amer: >60 mL/min (ref >60)
Glucose, Bld: 120 mg/dL — ABNORMAL HIGH (ref 65–99)
Potassium: 3.1 mmol/L — ABNORMAL LOW (ref 3.5–5.1)
Sodium: 136 mmol/L (ref 135–145)

## 2015-08-05 LAB — CBC WITH DIFFERENTIAL/PLATELET
Basophils Absolute: 0 10*3/uL (ref 0.0–0.1)
Basophils Relative: 0 %
Eosinophils Absolute: 0.1 10*3/uL (ref 0.0–0.7)
Eosinophils Relative: 1 %
HCT: 38.8 % (ref 36.0–46.0)
Hemoglobin: 12.6 g/dL (ref 12.0–15.0)
Lymphocytes Relative: 22 %
Lymphs Abs: 2.1 10*3/uL (ref 0.7–4.0)
MCH: 29.1 pg (ref 26.0–34.0)
MCHC: 32.5 g/dL (ref 30.0–36.0)
MCV: 89.6 fL (ref 78.0–100.0)
Monocytes Absolute: 0.3 10*3/uL (ref 0.1–1.0)
Monocytes Relative: 3 %
Neutro Abs: 7.1 10*3/uL (ref 1.7–7.7)
Neutrophils Relative %: 74 %
Platelets: 253 10*3/uL (ref 150–400)
RBC: 4.33 MIL/uL (ref 3.87–5.11)
RDW: 12.9 % (ref 11.5–15.5)
WBC: 9.7 10*3/uL (ref 4.0–10.5)

## 2015-08-05 MED ORDER — PANTOPRAZOLE SODIUM 40 MG PO TBEC
40.0000 mg | DELAYED_RELEASE_TABLET | Freq: Every day | ORAL | Status: DC
  Administered 2015-08-05: 40 mg via ORAL
  Filled 2015-08-05: qty 1

## 2015-08-05 MED ORDER — PREDNISONE 50 MG PO TABS
60.0000 mg | ORAL_TABLET | Freq: Once | ORAL | Status: AC
  Administered 2015-08-05: 60 mg via ORAL
  Filled 2015-08-05: qty 1

## 2015-08-05 MED ORDER — ALBUTEROL (5 MG/ML) CONTINUOUS INHALATION SOLN
10.0000 mg/h | INHALATION_SOLUTION | Freq: Once | RESPIRATORY_TRACT | Status: AC
  Administered 2015-08-05: 10 mg/h via RESPIRATORY_TRACT
  Filled 2015-08-05: qty 20

## 2015-08-05 MED ORDER — IPRATROPIUM BROMIDE 0.02 % IN SOLN
1.0000 mg | Freq: Once | RESPIRATORY_TRACT | Status: AC
  Administered 2015-08-05: 1 mg via RESPIRATORY_TRACT
  Filled 2015-08-05: qty 5

## 2015-08-05 MED ORDER — PREDNISONE 20 MG PO TABS: 40.0000 mg | ORAL_TABLET | Freq: Every day | ORAL | Status: DC

## 2015-08-05 NOTE — ED Notes (Signed)
Pt c/o cough, shortness of breath due to asthma and bronchitis. Seen by PCP, given antibiotics and steroids. Patient states she is worse.

## 2015-08-05 NOTE — ED Provider Notes (Signed)
CSN: [id number]     Arrival date & time 08/05/15  1834 History   First MD Initiated Contact with Patient 08/05/15 1837     Chief Complaint  Patient presents with  . Bronchitis      HPI Pt was seen at [facility].  Per pt, c/o gradual onset and worsening of persistent cough, wheezing and SOB for the past 2 weeks, worse over the past several days.  Describes her symptoms as "my asthma is acting up."  Has been using home MDI and nebs with transient relief. Pt states she was evaluated by her PMD 2 weeks ago for her symptoms, rx "antibiotics and steroids." States she "got better for a little bit then it all came back again." Denies CP/palpitations, no back pain, no abd pain, no N/V/D, no fevers, no rash.     Past Medical History  Diagnosis Date  . Hypertension   . Asthma   . Arthritis   . Hep C w/o coma, chronic ([place])   . Type 2 diabetes mellitus ([place])   . Hyperlipemia   . Depression    Past Surgical History  Procedure Laterality Date  . Cholecystectomy    . Appendectomy    . Abdominal hysterectomy    . Breast surgery    . Colonoscopy N/A 07/03/2014    treated/removed as above   Family History  Problem Relation Age of Onset  . Alcohol abuse Mother   . Early death Mother   . Early death Father   . Alcohol abuse Sister   . HIV Brother   . Diabetes Brother   . Heart disease Brother   . Hepatitis C Daughter   . Hypertension Son   . Cancer Maternal Aunt   . Hypertension Maternal Aunt   . Heart disease Maternal Aunt   . Cancer Maternal Uncle   . Hypertension Maternal Uncle   . Heart disease Brother   . Diabetes Brother    Social History  Substance Use Topics  . Smoking status: Current Every Day Smoker -- 0.50 packs/day for 50 years  . Smokeless tobacco: Never Used     Comment: one pack every 2-3 days  . Alcohol Use: No   OB History    Gravida Para Term Preterm AB TAB SAB Ectopic Multiple Living   4 2 2  2  2   2      Review of Systems ROS: Statement: All systems  negative except as marked or noted in the HPI; Constitutional: Negative for fever and chills. ; ; Eyes: Negative for eye pain, redness and discharge. ; ; ENMT: Negative for ear pain, hoarseness, nasal congestion, sinus pressure and sore throat. ; ; Cardiovascular: Negative for chest pain, palpitations, diaphoresis, and peripheral edema. ; ; Respiratory: +cough, wheezing, SOB. Negative for stridor. ; ; Gastrointestinal: Negative for nausea, vomiting, diarrhea, abdominal pain, blood in stool, hematemesis, jaundice and rectal bleeding. . ; ; Genitourinary: Negative for dysuria, flank pain and hematuria. ; ; Musculoskeletal: Negative for back pain and neck pain. Negative for swelling and trauma.; ; Skin: Negative for pruritus, rash, abrasions, blisters, bruising and skin lesion.; ; Neuro: Negative for headache, lightheadedness and neck stiffness. Negative for weakness, altered level of consciousness , altered mental status, extremity weakness, paresthesias, involuntary movement, seizure and syncope.      Allergies  Darvocet; Latex; Other; Penicillins; Tetracyclines & related; and Adhesive  Home Medications   Prior to Admission medications   Medication Sig Start Date End Date Taking? Authorizing Provider  albuterol (PROVENTIL  HFA;VENTOLIN HFA) 108 (90 BASE) MCG/ACT inhaler Inhale 1-2 puffs into the lungs every 6 (six) hours as needed for wheezing. 05/11/15   Tanna Furry, MD  aspirin EC 81 MG tablet Take 81 mg by mouth daily.      Historical Provider, MD  beclomethasone (QVAR) 40 MCG/ACT inhaler Inhale 1 puff into the lungs 2 (two) times daily as needed (shortness of breath).     Historical Provider, MD  benzonatate (TESSALON) 100 MG capsule Take 200 mg by mouth 3 (three) times daily as needed for cough.    Historical Provider, MD  cephALEXin (KEFLEX) 500 MG capsule Take 1 capsule (500 mg total) by mouth 3 (three) times daily. 12/09/14   Jola Schmidt, MD  dextromethorphan-guaiFENesin [facility] DM) 30-600 MG  per 12 hr tablet Take 1 tablet by mouth every 12 (twelve) hours. Patient not taking: Reported on 12/09/2014 05/23/13   Fredia Sorrow, MD  ergocalciferol (VITAMIN D2) 50000 UNITS capsule Take 50,000 Units by mouth once a week. sunday    Historical Provider, MD  fluticasone (FLONASE) 50 MCG/ACT nasal spray Place 2 sprays into the nose daily as needed for allergies.     Historical Provider, MD  furosemide (LASIX) 40 MG tablet Take 40 mg by mouth daily.      Historical Provider, MD  guaiFENesin-codeine 100-10 MG/5ML syrup Take 10 mLs by mouth 3 (three) times daily as needed for cough. 05/11/15   Tanna Furry, MD  guaiFENesin-codeine 100-10 MG/5ML syrup Take 10 mLs by mouth 3 (three) times daily as needed for cough. 05/11/15   Tanna Furry, MD  ibuprofen (ADVIL,MOTRIN) 800 MG tablet Take 800 mg by mouth every 8 (eight) hours as needed for mild pain.     Historical Provider, MD  latanoprost (XALATAN) 0.005 % ophthalmic solution Place 1 drop into the right eye at bedtime.    Historical Provider, MD  loratadine (CLARITIN) 10 MG tablet Take 10 mg by mouth daily.     Historical Provider, MD  losartan-hydrochlorothiazide (HYZAAR) 100-25 MG per tablet Take 1 tablet by mouth daily.    Historical Provider, MD  Omega-3 Fatty Acids (OMEGA-3 FISH OIL) 1200 MG CAPS Take 1,200 mg by mouth every morning.    Historical Provider, MD  omeprazole (PRILOSEC) 40 MG capsule Take 40 mg by mouth daily.     Historical Provider, MD  ondansetron (ZOFRAN ODT) 8 MG disintegrating tablet Take 1 tablet (8 mg total) by mouth every 8 (eight) hours as needed for nausea or vomiting. 12/09/14   Jola Schmidt, MD  PARoxetine (PAXIL) 20 MG tablet Take 20 mg by mouth 2 (two) times daily.    Historical Provider, MD  potassium chloride (K-DUR) 10 MEQ tablet Take 10 mEq by mouth daily.    Historical Provider, MD  predniSONE (DELTASONE) 20 MG tablet Take 1 tablet (20 mg total) by mouth 2 (two) times daily with a meal. 05/11/15   Tanna Furry, MD  verapamil  (VERELAN PM) 180 MG 24 hr capsule Take 1 capsule (180 mg total) by mouth at bedtime. NEED OV. 04/02/15   Pixie Casino, MD  zolpidem (AMBIEN) 10 MG tablet Take 1 tablet (10 mg total) by mouth at bedtime as needed for sleep. 11/27/14 12/27/14  Pixie Casino, MD   BP 157/78 mmHg  Pulse 63  Temp(Src) 97.8 F (36.6 C) (Oral)  Resp 16  Ht 5' 5.5" (1.664 m)  Wt 260 lb (117.935 kg)  BMI 42.59 kg/m2  SpO2 100% Physical Exam  1850: Physical examination:  Nursing  notes reviewed; Vital signs and O2 SAT reviewed;  Constitutional: Well developed, Well nourished, Well hydrated, In no acute distress; Head:  Normocephalic, atraumatic; Eyes: EOMI, PERRL, No scleral icterus; ENMT: Mouth and pharynx normal, Mucous membranes moist; Neck: Supple, Full range of motion, No lymphadenopathy; Cardiovascular: Regular rate and rhythm, No gallop; Respiratory: Breath sounds diminished, coarse & equal bilaterally, faint scattered wheezes. No audible wheezing. +moist cough during exam. Speaking full sentences, Normal respiratory effort/excursion; Chest: Nontender, Movement normal; Abdomen: Soft, Nontender, Nondistended, Normal bowel sounds; Genitourinary: No CVA tenderness; Extremities: Pulses normal, No tenderness, No edema, No calf edema or asymmetry.; Neuro: AA&Ox3, Major CN grossly intact.  Speech clear. No gross focal motor or sensory deficits in extremities.; Skin: Color normal, Warm, Dry.   ED Course  Procedures (including critical care time) Labs Review   Imaging Review  I have personally reviewed and evaluated these images and lab results as part of my medical decision-making.   EKG Interpretation None      MDM  MDM Reviewed: previous chart, nursing note and vitals Interpretation: x-ray     Dg Chest 2 View 08/05/2015  CLINICAL DATA:  Productive cough with shortness of breath did asthma and bronchitis worse tonight, treated with antibiotics and steroids, history hypertension, diabetes mellitus,  smoking, chronic hepatitis-C EXAM: CHEST  2 VIEW COMPARISON:  05/11/2015 FINDINGS: Upper-normal size of cardiac silhouette. Atherosclerotic calcification aorta. Mediastinal contours and pulmonary vascularity normal. Chronic bronchitic changes without infiltrate, pleural effusion or pneumothorax. Bones unremarkable. IMPRESSION: Chronic bronchitic changes. Electronically Signed   By: Lavonia Dana M.D.   On: 08/05/2015 19:14    2145:  Pt apparently spilled her neb treatment shortly after it was started. New hour long neb treatment now started. Pt will need to ambulate with pulse ox after neb treatment. Sign out to Dr. Sabra Heck.   Francine Graven, DO 08/05/15 2146

## 2015-08-05 NOTE — ED Notes (Addendum)
Pt reports that nebulizer fell apart and medication was lost. Pt states that she only received approximately 10 minutes of continuous neb.  Respiratory notified.

## 2015-08-05 NOTE — ED Notes (Signed)
Pt reporting continued cough and wheezing.  Respiratory notified for treatment.  Prednisone given.  Pt also reports that she takes omeprazole for acid reflux and hasn't had her dose this evening and is beginning to feel nauseated.

## 2015-08-05 NOTE — ED Provider Notes (Signed)
Pt has ambulated and did great - no hypoxia Requesting d/c.  Home with prednisone and bronchodilator therapy  Noemi Chapel, MD 08/05/15 2316

## 2015-08-05 NOTE — Patient Outreach (Signed)
[facility]) Care Management  08/05/2015  Megan Odom 1946/04/16 [id number]   Telephone call to patient who advised that she was unable to talk now. Requested to be called back this evening.  Plan: will follow up with patient.  [name], RN BSN [place] Management Coordinator [facility] Care Management  [phone]

## 2015-08-05 NOTE — Discharge Instructions (Signed)

## 2015-08-12 ENCOUNTER — Other Ambulatory Visit (HOSPITAL_COMMUNITY): Payer: Self-pay | Admitting: Internal Medicine

## 2015-08-12 DIAGNOSIS — F17200 Nicotine dependence, unspecified, uncomplicated: Secondary | ICD-10-CM | POA: Diagnosis not present

## 2015-08-12 DIAGNOSIS — R739 Hyperglycemia, unspecified: Secondary | ICD-10-CM | POA: Diagnosis not present

## 2015-08-12 DIAGNOSIS — E785 Hyperlipidemia, unspecified: Secondary | ICD-10-CM | POA: Diagnosis not present

## 2015-08-12 DIAGNOSIS — Z0000 Encounter for general adult medical examination without abnormal findings: Secondary | ICD-10-CM | POA: Diagnosis not present

## 2015-08-12 DIAGNOSIS — E663 Overweight: Secondary | ICD-10-CM | POA: Diagnosis not present

## 2015-08-12 DIAGNOSIS — D649 Anemia, unspecified: Secondary | ICD-10-CM | POA: Diagnosis not present

## 2015-08-12 DIAGNOSIS — J449 Chronic obstructive pulmonary disease, unspecified: Secondary | ICD-10-CM | POA: Diagnosis not present

## 2015-08-12 DIAGNOSIS — I10 Essential (primary) hypertension: Secondary | ICD-10-CM | POA: Diagnosis not present

## 2015-08-12 DIAGNOSIS — Z1231 Encounter for screening mammogram for malignant neoplasm of breast: Secondary | ICD-10-CM

## 2015-08-12 DIAGNOSIS — Z23 Encounter for immunization: Secondary | ICD-10-CM | POA: Diagnosis not present

## 2015-08-14 ENCOUNTER — Other Ambulatory Visit: Payer: Self-pay | Admitting: *Deleted

## 2015-08-14 ENCOUNTER — Encounter: Payer: Self-pay | Admitting: *Deleted

## 2015-08-14 NOTE — Patient Outreach (Signed)
[facility]) Care Management  08/14/2015  Megan Odom [birth year] [id number]  Telephone call to patient; left message requesting call back.  Plan: will send outreach letter and follow up in 10 business days>  [name], RN BSN [place] Management Coordinator [facility] Care Management  [phone]

## 2015-08-18 ENCOUNTER — Other Ambulatory Visit (HOSPITAL_COMMUNITY): Payer: Self-pay | Admitting: Internal Medicine

## 2015-08-18 DIAGNOSIS — Z136 Encounter for screening for cardiovascular disorders: Secondary | ICD-10-CM

## 2015-08-18 DIAGNOSIS — M1990 Unspecified osteoarthritis, unspecified site: Secondary | ICD-10-CM

## 2015-08-18 DIAGNOSIS — Z780 Asymptomatic menopausal state: Secondary | ICD-10-CM

## 2015-08-18 DIAGNOSIS — Z122 Encounter for screening for malignant neoplasm of respiratory organs: Secondary | ICD-10-CM

## 2015-08-19 ENCOUNTER — Ambulatory Visit (HOSPITAL_COMMUNITY)
Admission: RE | Admit: 2015-08-19 | Discharge: 2015-08-19 | Disposition: A | Discharge status: Home / Self Care | Payer: Commercial Managed Care - HMO | Source: Ambulatory Visit | Attending: Internal Medicine | Admitting: Internal Medicine

## 2015-08-19 ENCOUNTER — Other Ambulatory Visit (HOSPITAL_COMMUNITY): Payer: Self-pay | Admitting: Internal Medicine

## 2015-08-19 DIAGNOSIS — Z1231 Encounter for screening mammogram for malignant neoplasm of breast: Secondary | ICD-10-CM

## 2015-08-19 DIAGNOSIS — Z136 Encounter for screening for cardiovascular disorders: Secondary | ICD-10-CM

## 2015-08-19 DIAGNOSIS — I10 Essential (primary) hypertension: Secondary | ICD-10-CM | POA: Diagnosis not present

## 2015-08-20 ENCOUNTER — Ambulatory Visit (HOSPITAL_COMMUNITY): Payer: Commercial Managed Care - HMO

## 2015-08-26 ENCOUNTER — Ambulatory Visit (HOSPITAL_COMMUNITY)
Admission: RE | Admit: 2015-08-26 | Discharge: 2015-08-26 | Disposition: A | Discharge status: Home / Self Care | Payer: Commercial Managed Care - HMO | Source: Ambulatory Visit | Attending: Internal Medicine | Admitting: Internal Medicine

## 2015-08-26 ENCOUNTER — Encounter (HOSPITAL_COMMUNITY): Payer: Commercial Managed Care - HMO

## 2015-08-26 DIAGNOSIS — Z1389 Encounter for screening for other disorder: Secondary | ICD-10-CM | POA: Insufficient documentation

## 2015-08-26 DIAGNOSIS — Z87891 Personal history of nicotine dependence: Secondary | ICD-10-CM | POA: Diagnosis not present

## 2015-08-26 DIAGNOSIS — Z780 Asymptomatic menopausal state: Secondary | ICD-10-CM | POA: Diagnosis not present

## 2015-08-26 DIAGNOSIS — M1990 Unspecified osteoarthritis, unspecified site: Secondary | ICD-10-CM

## 2015-08-26 DIAGNOSIS — E119 Type 2 diabetes mellitus without complications: Secondary | ICD-10-CM | POA: Insufficient documentation

## 2015-08-26 DIAGNOSIS — I10 Essential (primary) hypertension: Secondary | ICD-10-CM | POA: Insufficient documentation

## 2015-08-26 DIAGNOSIS — Z136 Encounter for screening for cardiovascular disorders: Secondary | ICD-10-CM

## 2015-08-26 DIAGNOSIS — M810 Age-related osteoporosis without current pathological fracture: Secondary | ICD-10-CM | POA: Diagnosis not present

## 2015-08-26 DIAGNOSIS — M8588 Other specified disorders of bone density and structure, other site: Secondary | ICD-10-CM | POA: Insufficient documentation

## 2015-09-02 ENCOUNTER — Ambulatory Visit (HOSPITAL_COMMUNITY)
Admission: RE | Admit: 2015-09-02 | Discharge: 2015-09-02 | Disposition: A | Discharge status: Home / Self Care | Payer: Commercial Managed Care - HMO | Source: Ambulatory Visit | Attending: Internal Medicine | Admitting: Internal Medicine

## 2015-09-02 DIAGNOSIS — F17210 Nicotine dependence, cigarettes, uncomplicated: Secondary | ICD-10-CM | POA: Insufficient documentation

## 2015-09-02 DIAGNOSIS — J439 Emphysema, unspecified: Secondary | ICD-10-CM | POA: Insufficient documentation

## 2015-09-02 DIAGNOSIS — I2510 Atherosclerotic heart disease of native coronary artery without angina pectoris: Secondary | ICD-10-CM | POA: Insufficient documentation

## 2015-09-02 DIAGNOSIS — Z122 Encounter for screening for malignant neoplasm of respiratory organs: Secondary | ICD-10-CM | POA: Insufficient documentation

## 2015-09-02 DIAGNOSIS — I700 Atherosclerosis of aorta: Secondary | ICD-10-CM | POA: Diagnosis not present

## 2015-09-02 DIAGNOSIS — Z87891 Personal history of nicotine dependence: Secondary | ICD-10-CM | POA: Diagnosis not present

## 2015-09-04 ENCOUNTER — Other Ambulatory Visit: Payer: Self-pay | Admitting: *Deleted

## 2015-09-04 ENCOUNTER — Encounter: Payer: Self-pay | Admitting: *Deleted

## 2015-09-04 NOTE — Patient Outreach (Signed)
[place] [facility] [place]) Care Management  09/04/2015  Gerilynn Arrendondo [place] 1946-02-15 [id number]  No response from patient after outreach letter sent. Telephone call to patient left;  message on voice mail. Telephone to MD office, left message notifying of the above.   Plan:  Will close out case. Send MD closure letter.  [name], RN BSN [place] Management Coordinator [facility] Care Management  [phone]

## 2015-10-23 ENCOUNTER — Other Ambulatory Visit: Payer: Self-pay | Admitting: *Deleted

## 2015-10-23 NOTE — Patient Outreach (Signed)
[facility]) Care Management  10/23/2015  DANUTA ESFAHANI Jun 21, 1946 [id number]  Referral from [place] [facility]:  Telephone call to patient; left message on voice mail requesting call back.   Plan: Will follow up.  [name], RN BSN [place] Management Coordinator [facility] Care Management  [phone]

## 2015-10-26 ENCOUNTER — Other Ambulatory Visit: Payer: Self-pay | Admitting: *Deleted

## 2015-10-26 ENCOUNTER — Encounter (HOSPITAL_COMMUNITY): Payer: Self-pay

## 2015-10-26 NOTE — Patient Outreach (Signed)
[facility]) Care Management  10/26/2015  Megan Odom May 29, 1946 [id number]  Telephone call attempt x 2 ; left HIPPA compliant voice mail requesting return call.   Plan: Will follow up. Screening call placed on schedule.   [name], RN BSN [place] Management Coordinator [facility] Care Management  [phone]

## 2015-10-28 ENCOUNTER — Encounter: Payer: Self-pay | Admitting: *Deleted

## 2015-10-28 ENCOUNTER — Other Ambulatory Visit: Payer: Self-pay | Admitting: *Deleted

## 2015-10-28 NOTE — Patient Outreach (Signed)
[place] [facility]) Care Management  10/28/2015  DARLANE TRIPLET [birth year] [id number]   Telephone call attempt x 3; left HIPPA compliant voice mail requesting return call.   Plan: Will send outreach letter. Follow up in 10 business days.  [name], RN BSN [place] Management Coordinator [facility] Care Management  [phone]

## 2015-11-04 ENCOUNTER — Emergency Department (HOSPITAL_COMMUNITY): Payer: Commercial Managed Care - HMO

## 2015-11-04 ENCOUNTER — Encounter (HOSPITAL_COMMUNITY): Payer: Self-pay

## 2015-11-04 ENCOUNTER — Emergency Department (HOSPITAL_COMMUNITY)
Admission: EM | Admit: 2015-11-04 | Discharge: 2015-11-04 | Discharge status: Against Medical Advice | Payer: Commercial Managed Care - HMO | Attending: Dermatology | Admitting: Dermatology

## 2015-11-04 DIAGNOSIS — F329 Major depressive disorder, single episode, unspecified: Secondary | ICD-10-CM | POA: Diagnosis not present

## 2015-11-04 DIAGNOSIS — E119 Type 2 diabetes mellitus without complications: Secondary | ICD-10-CM | POA: Diagnosis not present

## 2015-11-04 DIAGNOSIS — R05 Cough: Secondary | ICD-10-CM | POA: Diagnosis not present

## 2015-11-04 DIAGNOSIS — M1990 Unspecified osteoarthritis, unspecified site: Secondary | ICD-10-CM | POA: Insufficient documentation

## 2015-11-04 DIAGNOSIS — Z7982 Long term (current) use of aspirin: Secondary | ICD-10-CM | POA: Diagnosis not present

## 2015-11-04 DIAGNOSIS — I10 Essential (primary) hypertension: Secondary | ICD-10-CM | POA: Insufficient documentation

## 2015-11-04 DIAGNOSIS — R197 Diarrhea, unspecified: Secondary | ICD-10-CM | POA: Insufficient documentation

## 2015-11-04 DIAGNOSIS — Z79899 Other long term (current) drug therapy: Secondary | ICD-10-CM | POA: Insufficient documentation

## 2015-11-04 DIAGNOSIS — E785 Hyperlipidemia, unspecified: Secondary | ICD-10-CM | POA: Insufficient documentation

## 2015-11-04 DIAGNOSIS — Z5321 Procedure and treatment not carried out due to patient leaving prior to being seen by health care provider: Secondary | ICD-10-CM | POA: Insufficient documentation

## 2015-11-04 DIAGNOSIS — F17200 Nicotine dependence, unspecified, uncomplicated: Secondary | ICD-10-CM | POA: Insufficient documentation

## 2015-11-04 DIAGNOSIS — J45909 Unspecified asthma, uncomplicated: Secondary | ICD-10-CM | POA: Diagnosis not present

## 2015-11-04 DIAGNOSIS — R404 Transient alteration of awareness: Secondary | ICD-10-CM | POA: Diagnosis not present

## 2015-11-04 DIAGNOSIS — R55 Syncope and collapse: Secondary | ICD-10-CM | POA: Diagnosis not present

## 2015-11-04 DIAGNOSIS — R0602 Shortness of breath: Secondary | ICD-10-CM | POA: Diagnosis not present

## 2015-11-04 LAB — CBC WITH DIFFERENTIAL/PLATELET
Basophils Absolute: 0 10*3/uL (ref 0.0–0.1)
Basophils Relative: 0 %
Eosinophils Absolute: 0.1 10*3/uL (ref 0.0–0.7)
Eosinophils Relative: 1 %
HCT: 38.4 % (ref 36.0–46.0)
Hemoglobin: 12.5 g/dL (ref 12.0–15.0)
Lymphocytes Relative: 22 %
Lymphs Abs: 2.4 10*3/uL (ref 0.7–4.0)
MCH: 29.3 pg (ref 26.0–34.0)
MCHC: 32.6 g/dL (ref 30.0–36.0)
MCV: 90.1 fL (ref 78.0–100.0)
Monocytes Absolute: 1 10*3/uL (ref 0.1–1.0)
Monocytes Relative: 9 %
Neutro Abs: 7.5 10*3/uL (ref 1.7–7.7)
Neutrophils Relative %: 68 %
Platelets: 242 10*3/uL (ref 150–400)
RBC: 4.26 MIL/uL (ref 3.87–5.11)
RDW: 13 % (ref 11.5–15.5)
WBC: 11 10*3/uL — ABNORMAL HIGH (ref 4.0–10.5)

## 2015-11-04 LAB — BASIC METABOLIC PANEL
Anion gap: 8 (ref 5–15)
BUN: 12 mg/dL (ref 6–20)
CO2: 25 mmol/L (ref 22–32)
Calcium: 8.6 mg/dL — ABNORMAL LOW (ref 8.9–10.3)
Chloride: 104 mmol/L (ref 101–111)
Creatinine, Ser: 0.81 mg/dL (ref 0.44–1.00)
GFR calc Af Amer: >60 mL/min (ref >60)
GFR calc non Af Amer: >60 mL/min (ref >60)
Glucose, Bld: 94 mg/dL (ref 65–99)
Potassium: 3 mmol/L — ABNORMAL LOW (ref 3.5–5.1)
Sodium: 137 mmol/L (ref 135–145)

## 2015-11-04 NOTE — ED Notes (Signed)
Pt reports cough, congestion, and diarrhea x 5 days.  Unsure of fever.

## 2015-11-04 NOTE — ED Notes (Signed)
Pt states she cannot wait any longer and will come back tomorrow.

## 2015-11-05 ENCOUNTER — Emergency Department (HOSPITAL_COMMUNITY)
Admission: EM | Admit: 2015-11-05 | Discharge: 2015-11-05 | Disposition: A | Discharge status: Home / Self Care | Payer: Commercial Managed Care - HMO | Attending: Emergency Medicine | Admitting: Emergency Medicine

## 2015-11-05 ENCOUNTER — Emergency Department (HOSPITAL_COMMUNITY): Payer: Commercial Managed Care - HMO

## 2015-11-05 ENCOUNTER — Encounter (HOSPITAL_COMMUNITY): Payer: Self-pay | Admitting: Emergency Medicine

## 2015-11-05 DIAGNOSIS — R6889 Other general symptoms and signs: Secondary | ICD-10-CM

## 2015-11-05 DIAGNOSIS — I10 Essential (primary) hypertension: Secondary | ICD-10-CM | POA: Diagnosis not present

## 2015-11-05 DIAGNOSIS — Z7982 Long term (current) use of aspirin: Secondary | ICD-10-CM | POA: Diagnosis not present

## 2015-11-05 DIAGNOSIS — E119 Type 2 diabetes mellitus without complications: Secondary | ICD-10-CM | POA: Diagnosis not present

## 2015-11-05 DIAGNOSIS — E785 Hyperlipidemia, unspecified: Secondary | ICD-10-CM | POA: Diagnosis not present

## 2015-11-05 DIAGNOSIS — J111 Influenza due to unidentified influenza virus with other respiratory manifestations: Secondary | ICD-10-CM | POA: Diagnosis not present

## 2015-11-05 DIAGNOSIS — Z7951 Long term (current) use of inhaled steroids: Secondary | ICD-10-CM | POA: Diagnosis not present

## 2015-11-05 DIAGNOSIS — J45909 Unspecified asthma, uncomplicated: Secondary | ICD-10-CM | POA: Insufficient documentation

## 2015-11-05 DIAGNOSIS — Z79899 Other long term (current) drug therapy: Secondary | ICD-10-CM | POA: Diagnosis not present

## 2015-11-05 DIAGNOSIS — F329 Major depressive disorder, single episode, unspecified: Secondary | ICD-10-CM | POA: Insufficient documentation

## 2015-11-05 DIAGNOSIS — F17210 Nicotine dependence, cigarettes, uncomplicated: Secondary | ICD-10-CM | POA: Diagnosis not present

## 2015-11-05 DIAGNOSIS — Z791 Long term (current) use of non-steroidal anti-inflammatories (NSAID): Secondary | ICD-10-CM | POA: Diagnosis not present

## 2015-11-05 DIAGNOSIS — R05 Cough: Secondary | ICD-10-CM | POA: Diagnosis not present

## 2015-11-05 DIAGNOSIS — F17200 Nicotine dependence, unspecified, uncomplicated: Secondary | ICD-10-CM | POA: Diagnosis not present

## 2015-11-05 LAB — CBC WITH DIFFERENTIAL/PLATELET
Basophils Absolute: 0 10*3/uL (ref 0.0–0.1)
Basophils Relative: 0 %
Eosinophils Absolute: 0 10*3/uL (ref 0.0–0.7)
Eosinophils Relative: 0 %
HCT: 37.9 % (ref 36.0–46.0)
Hemoglobin: 12.3 g/dL (ref 12.0–15.0)
Lymphocytes Relative: 15 %
Lymphs Abs: 1.3 10*3/uL (ref 0.7–4.0)
MCH: 28.9 pg (ref 26.0–34.0)
MCHC: 32.5 g/dL (ref 30.0–36.0)
MCV: 89.2 fL (ref 78.0–100.0)
Monocytes Absolute: 0.9 10*3/uL (ref 0.1–1.0)
Monocytes Relative: 10 %
Neutro Abs: 6.6 10*3/uL (ref 1.7–7.7)
Neutrophils Relative %: 75 %
Platelets: 245 10*3/uL (ref 150–400)
RBC: 4.25 MIL/uL (ref 3.87–5.11)
RDW: 13.1 % (ref 11.5–15.5)
WBC: 8.8 10*3/uL (ref 4.0–10.5)

## 2015-11-05 LAB — COMPREHENSIVE METABOLIC PANEL
ALT: 22 U/L (ref 14–54)
AST: 41 U/L (ref 15–41)
Albumin: 3.7 g/dL (ref 3.5–5.0)
Alkaline Phosphatase: 116 U/L (ref 38–126)
Anion gap: 9 (ref 5–15)
BUN: 9 mg/dL (ref 6–20)
CO2: 27 mmol/L (ref 22–32)
Calcium: 8.4 mg/dL — ABNORMAL LOW (ref 8.9–10.3)
Chloride: 104 mmol/L (ref 101–111)
Creatinine, Ser: 0.77 mg/dL (ref 0.44–1.00)
GFR calc Af Amer: >60 mL/min (ref >60)
GFR calc non Af Amer: >60 mL/min (ref >60)
Glucose, Bld: 101 mg/dL — ABNORMAL HIGH (ref 65–99)
Potassium: 3.4 mmol/L — ABNORMAL LOW (ref 3.5–5.1)
Sodium: 140 mmol/L (ref 135–145)
Total Bilirubin: 0.6 mg/dL (ref 0.3–1.2)
Total Protein: 9 g/dL — ABNORMAL HIGH (ref 6.5–8.1)

## 2015-11-05 LAB — TROPONIN I
Troponin I: 0.04 ng/mL — ABNORMAL HIGH (ref <0.031)
Troponin I: 0.03 ng/mL (ref <0.031)

## 2015-11-05 LAB — BRAIN NATRIURETIC PEPTIDE: B Natriuretic Peptide: 188 pg/mL — ABNORMAL HIGH (ref 0.0–100.0)

## 2015-11-05 MED ORDER — FUROSEMIDE 10 MG/ML IJ SOLN
80.0000 mg | Freq: Once | INTRAMUSCULAR | Status: AC
  Administered 2015-11-05: 80 mg via INTRAVENOUS
  Filled 2015-11-05: qty 8

## 2015-11-05 MED ORDER — ACETAMINOPHEN 500 MG PO TABS
ORAL_TABLET | ORAL | Status: AC
  Filled 2015-11-05: qty 2

## 2015-11-05 MED ORDER — ALBUTEROL SULFATE HFA 108 (90 BASE) MCG/ACT IN AERS
2.0000 | INHALATION_SPRAY | Freq: Four times a day (QID) | RESPIRATORY_TRACT | Status: DC
Start: 2015-11-05 — End: 2015-11-05
  Administered 2015-11-05: 2 via RESPIRATORY_TRACT
  Filled 2015-11-05: qty 6.7

## 2015-11-05 MED ORDER — DM-GUAIFENESIN ER 30-600 MG PO TB12: 1.0000 | ORAL_TABLET | Freq: Two times a day (BID) | ORAL | Status: DC

## 2015-11-05 MED ORDER — IPRATROPIUM-ALBUTEROL 0.5-2.5 (3) MG/3ML IN SOLN
3.0000 mL | Freq: Once | RESPIRATORY_TRACT | Status: AC
  Administered 2015-11-05: 3 mL via RESPIRATORY_TRACT
  Filled 2015-11-05: qty 3

## 2015-11-05 MED ORDER — ONDANSETRON HCL 4 MG/2ML IJ SOLN
4.0000 mg | Freq: Once | INTRAMUSCULAR | Status: AC
  Administered 2015-11-05: 4 mg via INTRAVENOUS
  Filled 2015-11-05: qty 2

## 2015-11-05 MED ORDER — SODIUM CHLORIDE 0.9 % IV SOLN
INTRAVENOUS | Status: DC
  Administered 2015-11-05: 15:00:00 via INTRAVENOUS

## 2015-11-05 MED ORDER — ACETAMINOPHEN 500 MG PO TABS
1000.0000 mg | ORAL_TABLET | Freq: Once | ORAL | Status: AC
  Administered 2015-11-05: 1000 mg via ORAL

## 2015-11-05 MED ORDER — SODIUM CHLORIDE 0.9 % IV BOLUS (SEPSIS)
500.0000 mL | Freq: Once | INTRAVENOUS | Status: AC
  Administered 2015-11-05: 500 mL via INTRAVENOUS

## 2015-11-05 MED ORDER — HYDROCODONE-ACETAMINOPHEN 5-325 MG PO TABS: 1.0000 | ORAL_TABLET | Freq: Four times a day (QID) | ORAL | Status: DC | PRN

## 2015-11-05 NOTE — ED Provider Notes (Signed)
CSN: [id number]     Arrival date & time 11/05/15  1151 History  By signing my name below, I, Megan Odom, attest that this documentation has been prepared under the direction and in the presence of Fredia Sorrow, MD. Electronically Signed: Jolayne Odom, Scribe. 11/05/2015. 12:36 PM.   Chief Complaint  Patient presents with  . Cough    cough resulting in stress incontinence   Patient is a 70 y.o. female presenting with vomiting. The history is provided by the patient. No language interpreter was used.  Emesis Severity:  Mild Duration:  1 week Timing:  Constant Number of daily episodes:  1 Quality:  Unable to specify Progression:  Improving Chronicity:  New Recent urination:  Normal Relieved by:  Nothing Worsened by:  Nothing tried Ineffective treatments:  None tried Associated symptoms: abdominal pain, chills, diarrhea, headaches and myalgias   Associated symptoms: no sore throat   Abdominal pain:    Location:  Generalized   Quality:  Unable to specify   Severity:  Mild   Onset quality:  Sudden   Duration:  1 week   Timing:  Constant   Progression:  Unchanged   Chronicity:  New Diarrhea:    Quality:  Unable to specify   Number of occurrences:  1 per day   Severity:  Mild   Duration:  1 week   Timing:  Constant   Progression:  Improving  HPI Comments: Megan Odom is a 70 y.o. female who presents to the Emergency Department complaining of sudden onset of vomiting, diarrhea, and persistent nonproductive cough, which began about one week ago. She also reports associated incontinence secondary to coughing as well as a HA, fever, chills, and body aches. Pt last vomited and had an episode of diarrhea yesterday. She does not currently have an inhaler at home but has used inhalers in the past. Pt denies rhinorrhea, sore throat, visual disturbance, leg swelling, dysuria, hematuria, back pain, neck pain, and rash.   Past Medical History  Diagnosis Date  .  Hypertension   . Asthma   . Arthritis   . Hep C w/o coma, chronic ([place])   . Type 2 diabetes mellitus ([place])   . Hyperlipemia   . Depression    Past Surgical History  Procedure Laterality Date  . Cholecystectomy    . Appendectomy    . Abdominal hysterectomy    . Breast surgery    . Colonoscopy N/A 07/03/2014    treated/removed as above   Family History  Problem Relation Age of Onset  . Alcohol abuse Mother   . Early death Mother   . Early death Father   . Alcohol abuse Sister   . HIV Brother   . Diabetes Brother   . Heart disease Brother   . Hepatitis C Daughter   . Hypertension Son   . Cancer Maternal Aunt   . Hypertension Maternal Aunt   . Heart disease Maternal Aunt   . Cancer Maternal Uncle   . Hypertension Maternal Uncle   . Heart disease Brother   . Diabetes Brother    Social History  Substance Use Topics  . Smoking status: Current Every Day Smoker -- 0.50 packs/day for 50 years  . Smokeless tobacco: Never Used     Comment: one pack every 2-3 days  . Alcohol Use: No   OB History    Gravida Para Term Preterm AB TAB SAB Ectopic Multiple Living   4 2 2  2   2  2     Review of Systems  Constitutional: Positive for fever and chills.  HENT: Positive for congestion. Negative for rhinorrhea and sore throat.   Eyes: Negative for visual disturbance.  Respiratory: Positive for cough and shortness of breath.   Cardiovascular: Positive for chest pain. Negative for leg swelling.  Gastrointestinal: Positive for nausea, vomiting, abdominal pain and diarrhea.  Genitourinary: Negative for dysuria and hematuria.       Incontinence with cough  Musculoskeletal: Positive for myalgias. Negative for back pain and neck pain.  Skin: Negative for rash.  Neurological: Positive for headaches.  Hematological: Does not bruise/bleed easily.  Psychiatric/Behavioral: Negative for confusion.  All other systems reviewed and are negative.  Allergies  Darvocet; Latex; Other;  Penicillins; Tetracyclines & related; and Adhesive  Home Medications   Prior to Admission medications   Medication Sig Start Date End Date Taking? Authorizing Provider  albuterol (PROVENTIL HFA;VENTOLIN HFA) 108 (90 BASE) MCG/ACT inhaler Inhale 1-2 puffs into the lungs every 6 (six) hours as needed for wheezing. 05/11/15  Yes Tanna Furry, MD  ALPRAZolam Duanne Moron) 0.5 MG tablet Take 1 tablet by mouth 2 (two) times daily as needed for anxiety.  08/18/15  Yes Historical Provider, MD  aspirin EC 81 MG tablet Take 81 mg by mouth daily.     Yes Historical Provider, MD  beclomethasone (QVAR) 40 MCG/ACT inhaler Inhale 1 puff into the lungs 2 (two) times daily as needed (shortness of breath).    Yes Historical Provider, MD  ergocalciferol (VITAMIN D2) 50000 UNITS capsule Take 50,000 Units by mouth once a week. sunday   Yes Historical Provider, MD  fluticasone (FLONASE) 50 MCG/ACT nasal spray Place 2 sprays into the nose daily as needed for allergies.    Yes Historical Provider, MD  furosemide (LASIX) 40 MG tablet Take 40 mg by mouth daily.     Yes Historical Provider, MD  ibuprofen (ADVIL,MOTRIN) 800 MG tablet Take 800 mg by mouth every 8 (eight) hours as needed for mild pain.    Yes Historical Provider, MD  latanoprost (XALATAN) 0.005 % ophthalmic solution Place 1 drop into the right eye at bedtime.   Yes Historical Provider, MD  loratadine (CLARITIN) 10 MG tablet Take 10 mg by mouth daily.    Yes Historical Provider, MD  losartan-hydrochlorothiazide (HYZAAR) 100-25 MG per tablet Take 1 tablet by mouth daily.   Yes Historical Provider, MD  Omega-3 Fatty Acids (OMEGA-3 FISH OIL) 1200 MG CAPS Take 1,200 mg by mouth every morning.   Yes Historical Provider, MD  omeprazole (PRILOSEC) 40 MG capsule Take 40 mg by mouth daily.    Yes Historical Provider, MD  PARoxetine (PAXIL) 20 MG tablet Take 20 mg by mouth 2 (two) times daily.   Yes Historical Provider, MD  Phenyleph-CPM-DM-Aspirin (ALKA-SELTZER PLUS COLD &  COUGH PO) Take 2 capsules by mouth every 6 (six) hours as needed (cough/cold).   Yes Historical Provider, MD  potassium chloride (K-DUR) 10 MEQ tablet Take 10 mEq by mouth daily.   Yes Historical Provider, MD  Pseudoeph-Doxylamine-DM-APAP (DAYQUIL/NYQUIL COLD/FLU RELIEF PO) Take 2 capsules by mouth every 6 (six) hours as needed (cough/congestion).   Yes Historical Provider, MD  verapamil (CALAN-SR) 120 MG CR tablet Take 1 tablet by mouth daily. 09/01/15  Yes Historical Provider, MD  zolpidem (AMBIEN) 10 MG tablet Take 1 tablet (10 mg total) by mouth at bedtime as needed for sleep. 11/27/14  Yes Pixie Casino, MD  dextromethorphan-guaiFENesin [facility] DM) 30-600 MG 12hr tablet Take 1  tablet by mouth 2 (two) times daily. 11/05/15   Fredia Sorrow, MD  HYDROcodone-acetaminophen (NORCO/VICODIN) 5-325 MG tablet Take 1-2 tablets by mouth every 6 (six) hours as needed. 11/05/15   Fredia Sorrow, MD  predniSONE (DELTASONE) 20 MG tablet Take 2 tablets (40 mg total) by mouth daily. Patient not taking: Reported on 11/05/2015 08/05/15   Noemi Chapel, MD  verapamil (VERELAN PM) 180 MG 24 hr capsule Take 1 capsule (180 mg total) by mouth at bedtime. NEED OV. Patient not taking: Reported on 11/05/2015 04/02/15   Pixie Casino, MD   BP 170/77 mmHg  Pulse 68  Temp(Src) 98 F (36.7 C) (Temporal)  Resp 18  SpO2 98% Physical Exam  Constitutional: She is oriented to person, place, and time. She appears well-developed and well-nourished. No distress.  HENT:  Head: Normocephalic and atraumatic.  Mucous membranes are dry  Eyes: EOM are normal. Pupils are equal, round, and reactive to light.  Sclera are red  Cardiovascular: Normal rate, regular rhythm and normal heart sounds.   No murmur heard. No swelling in the ankles  Pulmonary/Chest: Effort normal and breath sounds normal.  Lungs clear bilaterally, no wheezing.  Abdominal: Soft. Bowel sounds are normal. She exhibits no distension. There is no tenderness.   Musculoskeletal: She exhibits no edema.  Neurological: She is alert and oriented to person, place, and time. No cranial nerve deficit. She exhibits normal muscle tone. Coordination normal.  Skin: Skin is warm and dry.  Psychiatric: She has a normal mood and affect.  Nursing note and vitals reviewed.  ED Course  Procedures  DIAGNOSTIC STUDIES:    Oxygen Saturation is 95% on RA, adequate by my interpretation.   COORDINATION OF CARE:  12:25 PM Will order chest-xray and labs. Will administer pt breathing treatment. Discussed treatment plan with pt at bedside and pt agreed to plan.   Results for orders placed or performed during the hospital encounter of 11/05/15  Comprehensive metabolic panel  Result Value Ref Range   Sodium 140 135 - 145 mmol/L   Potassium 3.4 (L) 3.5 - 5.1 mmol/L   Chloride 104 101 - 111 mmol/L   CO2 27 22 - 32 mmol/L   Glucose, Bld 101 (H) 65 - 99 mg/dL   BUN 9 6 - 20 mg/dL   Creatinine, Ser 0.77 0.44 - 1.00 mg/dL   Calcium 8.4 (L) 8.9 - 10.3 mg/dL   Total Protein 9.0 (H) 6.5 - 8.1 g/dL   Albumin 3.7 3.5 - 5.0 g/dL   AST 41 15 - 41 U/L   ALT 22 14 - 54 U/L   Alkaline Phosphatase 116 38 - 126 U/L   Total Bilirubin 0.6 0.3 - 1.2 mg/dL   GFR calc non Af Amer >60 >60 mL/min   GFR calc Af Amer >60 >60 mL/min   Anion gap 9 5 - 15  CBC with Differential/Platelet  Result Value Ref Range   WBC 8.8 4.0 - 10.5 K/uL   RBC 4.25 3.87 - 5.11 MIL/uL   Hemoglobin 12.3 12.0 - 15.0 g/dL   HCT 37.9 36.0 - 46.0 %   MCV 89.2 78.0 - 100.0 fL   MCH 28.9 26.0 - 34.0 pg   MCHC 32.5 30.0 - 36.0 g/dL   RDW 13.1 11.5 - 15.5 %   Platelets 245 150 - 400 K/uL   Neutrophils Relative % 75 %   Neutro Abs 6.6 1.7 - 7.7 K/uL   Lymphocytes Relative 15 %   Lymphs Abs 1.3 0.7 - 4.0  K/uL   Monocytes Relative 10 %   Monocytes Absolute 0.9 0.1 - 1.0 K/uL   Eosinophils Relative 0 %   Eosinophils Absolute 0.0 0.0 - 0.7 K/uL   Basophils Relative 0 %   Basophils Absolute 0.0 0.0 - 0.1  K/uL  Troponin I  Result Value Ref Range   Troponin I 0.04 (H) <0.031 ng/mL  Brain natriuretic peptide  Result Value Ref Range   B Natriuretic Peptide 188.0 (H) 0.0 - 100.0 pg/mL  Troponin I  Result Value Ref Range   Troponin I 0.03 <0.031 ng/mL   Dg Chest 2 View  11/05/2015  CLINICAL DATA:  Cough EXAM: CHEST  2 VIEW COMPARISON:  11/04/2015 FINDINGS: Cardiac enlargement with mild vascular congestion which has progressed in the interval. Findings suggest mild fluid overload. Possible mild interstitial edema. No effusion. Negative for pneumonia. Mild right lower lobe atelectasis. IMPRESSION: Vascular congestion with early interstitial edema. Mild right lower lobe atelectasis. Electronically Signed   By: Franchot Gallo M.D.   On: 11/05/2015 13:23   Dg Chest 2 View  11/04/2015  CLINICAL DATA:  Productive cough and congestion with diarrhea for 5 days. EXAM: CHEST  2 VIEW COMPARISON:  CT chest 09/02/2015.  Chest x-ray 08/05/2015 FINDINGS: The lungs are clear wiithout focal pneumonia, edema, pneumothorax or pleural effusion. Interstitial markings are diffusely coarsened with chronic features. The cardiopericardial silhouette is within normal limits for size. The visualized bony structures of the thorax are intact. IMPRESSION: Stable exam. Chronic interstitial changes without acute cardiopulmonary process. Electronically Signed   By: Misty Stanley M.D.   On: 11/04/2015 18:57    Medications  0.9 %  sodium chloride infusion ( Intravenous Stopped 11/05/15 1742)  albuterol (PROVENTIL HFA;VENTOLIN HFA) 108 (90 Base) MCG/ACT inhaler 2 puff (not administered)  sodium chloride 0.9 % bolus 500 mL (0 mLs Intravenous Stopped 11/05/15 1443)  ipratropium-albuterol (DUONEB) 0.5-2.5 (3) MG/3ML nebulizer solution 3 mL (3 mLs Nebulization Given 11/05/15 1250)  ondansetron (ZOFRAN) injection 4 mg (4 mg Intravenous Given 11/05/15 1303)  furosemide (LASIX) injection 80 mg (80 mg Intravenous Given 11/05/15 1442)   acetaminophen (TYLENOL) 500 MG tablet (  Return to [facility] 11/05/15 1544)  acetaminophen (TYLENOL) tablet 1,000 mg (1,000 mg Oral Given 11/05/15 1544)   I have personally reviewed and evaluated these images and lab results as part of my medical decision-making.  MDM   Final diagnoses:  Flu-like symptoms     Patient presents symptoms that were consistent with the flu. Patient with complaint of cough congestion bodyaches headache diarrhea and some vomiting but not the numerous quantity on either one. Patient's chest x-ray was negative for pneumonia but raise some concerns about early pulmonary edema. Patient does have a slight elevation in her BNP. But of more concern was that her initial troponin was slightly abnormal. Repeat 3 hours later was normal. So this is probably not a significant finding. Will treat patient for flulike illness. Treat her symptomatically. Encourage her to continue taking her Lasix. She was treated with some Lasix here. Patient's oxygen saturations have been in the upper 90s. Patient the nontoxic no acute distress. Patient can be treated at home and return for any new or worse symptoms. She's had the flulike illness now for about a week.    I personally performed the services described in this documentation, which was scribed in my presence. The recorded information has been reviewed and is accurate.      Fredia Sorrow, MD 11/05/15 [phone]

## 2015-11-05 NOTE — Discharge Instructions (Signed)
Use albuterol inhaler 2 puffs every 6 hours for the next 7 days. Take Mucinex DM for cough and congestion. Take the hydrocodone as needed for bodyaches. Make an appoint to follow-up with your regular doctor. Return for any new or worse symptoms. Make sure you take a Lasix pill daily. Return for worsening shortness of breath.

## 2015-11-05 NOTE — ED Notes (Signed)
Pt states she is mainly here for a cough. Has been having incontinence when she coughs.

## 2015-11-05 NOTE — ED Notes (Signed)
Pt states she has been having vomiting, diarrhea, and a cough for about a week.  Was here last night for the same thing but did not want to wait.

## 2015-11-05 NOTE — ED Notes (Signed)
Pt given 4 ginger ales, 1 sprite, several graham crackers and peanut butters and ice cream.   Became upset after we ran out of ice cream and regular coke. Pt aware of vending machine in waiting room.

## 2015-11-13 ENCOUNTER — Encounter: Payer: Self-pay | Admitting: *Deleted

## 2015-11-13 ENCOUNTER — Other Ambulatory Visit: Payer: Self-pay | Admitting: *Deleted

## 2015-11-13 NOTE — Patient Outreach (Signed)
[place] [facility]) Care Management  11/13/2015  Megan Odom 09/26/1945 [id number]  Unsuccessful call attempts and no response to outreach letter.  Plan: Send [facility] brochure and Know Before You Go literature to patient. Send MD closure letter. Refer to care management assistant to close out.  [name], RN BSN [place] Management Coordinator [facility] Care Management  [phone]

## 2015-11-18 DIAGNOSIS — J449 Chronic obstructive pulmonary disease, unspecified: Secondary | ICD-10-CM | POA: Diagnosis not present

## 2015-11-18 DIAGNOSIS — F17200 Nicotine dependence, unspecified, uncomplicated: Secondary | ICD-10-CM | POA: Diagnosis not present

## 2015-11-18 DIAGNOSIS — I10 Essential (primary) hypertension: Secondary | ICD-10-CM | POA: Diagnosis not present

## 2016-02-13 ENCOUNTER — Emergency Department (HOSPITAL_COMMUNITY): Payer: Commercial Managed Care - HMO

## 2016-02-13 ENCOUNTER — Encounter (HOSPITAL_COMMUNITY): Payer: Self-pay | Admitting: *Deleted

## 2016-02-13 ENCOUNTER — Emergency Department (HOSPITAL_COMMUNITY)
Admission: EM | Admit: 2016-02-13 | Discharge: 2016-02-13 | Disposition: A | Discharge status: Home / Self Care | Payer: Commercial Managed Care - HMO | Attending: Emergency Medicine | Admitting: Emergency Medicine

## 2016-02-13 DIAGNOSIS — F329 Major depressive disorder, single episode, unspecified: Secondary | ICD-10-CM | POA: Insufficient documentation

## 2016-02-13 DIAGNOSIS — M1990 Unspecified osteoarthritis, unspecified site: Secondary | ICD-10-CM | POA: Diagnosis not present

## 2016-02-13 DIAGNOSIS — F17200 Nicotine dependence, unspecified, uncomplicated: Secondary | ICD-10-CM | POA: Insufficient documentation

## 2016-02-13 DIAGNOSIS — J45909 Unspecified asthma, uncomplicated: Secondary | ICD-10-CM | POA: Diagnosis not present

## 2016-02-13 DIAGNOSIS — I10 Essential (primary) hypertension: Secondary | ICD-10-CM | POA: Diagnosis not present

## 2016-02-13 DIAGNOSIS — M25552 Pain in left hip: Secondary | ICD-10-CM | POA: Insufficient documentation

## 2016-02-13 DIAGNOSIS — Z79899 Other long term (current) drug therapy: Secondary | ICD-10-CM | POA: Insufficient documentation

## 2016-02-13 DIAGNOSIS — Z7982 Long term (current) use of aspirin: Secondary | ICD-10-CM | POA: Insufficient documentation

## 2016-02-13 DIAGNOSIS — E785 Hyperlipidemia, unspecified: Secondary | ICD-10-CM | POA: Insufficient documentation

## 2016-02-13 DIAGNOSIS — M545 Low back pain: Secondary | ICD-10-CM | POA: Diagnosis not present

## 2016-02-13 DIAGNOSIS — Z791 Long term (current) use of non-steroidal anti-inflammatories (NSAID): Secondary | ICD-10-CM | POA: Diagnosis not present

## 2016-02-13 DIAGNOSIS — M5416 Radiculopathy, lumbar region: Secondary | ICD-10-CM | POA: Diagnosis not present

## 2016-02-13 LAB — URINALYSIS, ROUTINE W REFLEX MICROSCOPIC
Bilirubin Urine: NEGATIVE
Glucose, UA: NEGATIVE mg/dL
Hgb urine dipstick: NEGATIVE
Ketones, ur: NEGATIVE mg/dL
Leukocytes, UA: NEGATIVE
Nitrite: NEGATIVE
Specific Gravity, Urine: 1.025 (ref 1.005–1.030)
pH: 5.5 (ref 5.0–8.0)

## 2016-02-13 LAB — URINE MICROSCOPIC-ADD ON

## 2016-02-13 MED ORDER — HYDROMORPHONE HCL 1 MG/ML IJ SOLN
1.0000 mg | Freq: Once | INTRAMUSCULAR | Status: AC
  Administered 2016-02-13: 1 mg via INTRAMUSCULAR
  Filled 2016-02-13: qty 1

## 2016-02-13 NOTE — ED Notes (Signed)
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 Pt has had back pain since yesterday. This moves into her left hip and leg. Pt is able to ambulate.

## 2016-02-13 NOTE — Discharge Instructions (Signed)

## 2016-02-13 NOTE — ED Notes (Signed)
Pt provided with juice, ice, and crackers as requested.

## 2016-02-13 NOTE — ED Provider Notes (Signed)
History  By signing my name below, I, Megan Odom, attest that this documentation has been prepared under the direction and in the presence of Ripley Fraise, MD. Electronically Signed: Bea Odom, ED Scribe. 02/13/2016. 11:33 AM.  Chief Complaint  Patient presents with  . Back Pain   Patient is a 70 y.o. female presenting with back pain. The history is provided by the patient and medical records. No language interpreter was used.  Back Pain Location:  Lumbar spine Radiates to:  L posterior upper leg Pain severity:  Moderate Pain is:  Same all the time Onset quality:  Sudden Duration:  1 day Timing:  Constant Chronicity:  Recurrent Context: lifting heavy objects   Relieved by:  None tried Worsened by:  Movement Ineffective treatments:  None tried Risk factors: obesity     HPI Comments:  Megan Odom is a 70 y.o. obese female who presents to the Emergency Department complaining of lower back pain that began yesterday. She reports the pain radiates into her left hip. Pt states she works at [employer] and was lifting several patients. She reports waking last night and had a small bowel movement in her sleep. She reports associated nausea. She reports having pain similar to this in her back but reports this is worse. She has not taken anything for pain. Any type of movement or staying in one position for too long increases her pain. She denies alleviating factors. She denies falling. She denies urinary symptoms, dysuria, hematuria, vomiting, CP, abdominal pain. Pt is ambulatory without assistance. No urinary incontinence No h/o back surgery Past Medical History  Diagnosis Date  . Hypertension   . Asthma   . Arthritis   . Hep C w/o coma, chronic ([place])   . Type 2 diabetes mellitus ([place])   . Hyperlipemia   . Depression    Past Surgical History  Procedure Laterality Date  . Cholecystectomy    . Appendectomy    . Abdominal hysterectomy    . Breast surgery    .  Colonoscopy N/A 07/03/2014    treated/removed as above   Family History  Problem Relation Age of Onset  . Alcohol abuse Mother   . Early death Mother   . Early death Father   . Alcohol abuse Sister   . HIV Brother   . Diabetes Brother   . Heart disease Brother   . Hepatitis C Daughter   . Hypertension Son   . Cancer Maternal Aunt   . Hypertension Maternal Aunt   . Heart disease Maternal Aunt   . Cancer Maternal Uncle   . Hypertension Maternal Uncle   . Heart disease Brother   . Diabetes Brother    Social History  Substance Use Topics  . Smoking status: Current Every Day Smoker -- 0.50 packs/day for 50 years  . Smokeless tobacco: Never Used     Comment: one pack every 2-3 days  . Alcohol Use: No   OB History    Gravida Para Term Preterm AB TAB SAB Ectopic Multiple Living   4 2 2  2  2   2      Review of Systems  Gastrointestinal: Positive for nausea.  Musculoskeletal: Positive for back pain.  All other systems reviewed and are negative.   Allergies  Darvocet; Latex; Other; Penicillins; Tetracyclines & related; and Adhesive  Home Medications   Prior to Admission medications   Medication Sig Start Date End Date Taking? Authorizing Provider  albuterol (PROVENTIL HFA;VENTOLIN HFA) 108 (90 BASE)  MCG/ACT inhaler Inhale 1-2 puffs into the lungs every 6 (six) hours as needed for wheezing. 05/11/15   Tanna Furry, MD  ALPRAZolam Duanne Moron) 0.5 MG tablet Take 1 tablet by mouth 2 (two) times daily as needed for anxiety.  08/18/15   Historical Provider, MD  aspirin EC 81 MG tablet Take 81 mg by mouth daily.      Historical Provider, MD  beclomethasone (QVAR) 40 MCG/ACT inhaler Inhale 1 puff into the lungs 2 (two) times daily as needed (shortness of breath).     Historical Provider, MD  dextromethorphan-guaiFENesin (MUCINEX DM) 30-600 MG 12hr tablet Take 1 tablet by mouth 2 (two) times daily. Patient not taking: Reported on 02/13/2016 11/05/15   Fredia Sorrow, MD  ergocalciferol  (VITAMIN D2) 50000 UNITS capsule Take 50,000 Units by mouth once a week. sunday    Historical Provider, MD  fluticasone (FLONASE) 50 MCG/ACT nasal spray Place 2 sprays into the nose daily as needed for allergies.     Historical Provider, MD  furosemide (LASIX) 40 MG tablet Take 40 mg by mouth daily.      Historical Provider, MD  HYDROcodone-acetaminophen (NORCO/VICODIN) 5-325 MG tablet Take 1-2 tablets by mouth every 6 (six) hours as needed. Patient not taking: Reported on 02/13/2016 11/05/15   Fredia Sorrow, MD  ibuprofen (ADVIL,MOTRIN) 800 MG tablet Take 800 mg by mouth every 8 (eight) hours as needed for mild pain.     Historical Provider, MD  latanoprost (XALATAN) 0.005 % ophthalmic solution Place 1 drop into the right eye at bedtime.    Historical Provider, MD  loratadine (CLARITIN) 10 MG tablet Take 10 mg by mouth daily.     Historical Provider, MD  losartan-hydrochlorothiazide (HYZAAR) 100-25 MG per tablet Take 1 tablet by mouth daily.    Historical Provider, MD  Omega-3 Fatty Acids (OMEGA-3 FISH OIL) 1200 MG CAPS Take 1,200 mg by mouth every morning.    Historical Provider, MD  omeprazole (PRILOSEC) 40 MG capsule Take 40 mg by mouth daily.     Historical Provider, MD  PARoxetine (PAXIL) 20 MG tablet Take 20 mg by mouth 2 (two) times daily.    Historical Provider, MD  Phenyleph-CPM-DM-Aspirin (ALKA-SELTZER PLUS COLD & COUGH PO) Take 2 capsules by mouth every 6 (six) hours as needed (cough/cold).    Historical Provider, MD  potassium chloride (K-DUR) 10 MEQ tablet Take 10 mEq by mouth daily.    Historical Provider, MD  predniSONE (DELTASONE) 20 MG tablet Take 2 tablets (40 mg total) by mouth daily. Patient not taking: Reported on 11/05/2015 08/05/15   Noemi Chapel, MD  Pseudoeph-Doxylamine-DM-APAP (DAYQUIL/NYQUIL COLD/FLU RELIEF PO) Take 2 capsules by mouth every 6 (six) hours as needed (cough/congestion).    Historical Provider, MD  verapamil (CALAN-SR) 120 MG CR tablet Take 1 tablet by mouth  daily. 09/01/15   Historical Provider, MD  verapamil (VERELAN PM) 180 MG 24 hr capsule Take 1 capsule (180 mg total) by mouth at bedtime. NEED OV. Patient not taking: Reported on 11/05/2015 04/02/15   Pixie Casino, MD  zolpidem (AMBIEN) 10 MG tablet Take 1 tablet (10 mg total) by mouth at bedtime as needed for sleep. 11/27/14   Pixie Casino, MD   Triage Vitals: BP 133/87 mmHg  Pulse 61  Temp(Src) 98.1 F (36.7 C) (Oral)  Resp 16  Ht 5\' 5"  (1.651 m)  Wt 245 lb (111.131 kg)  BMI 40.77 kg/m2  SpO2 96%  Physical Exam  CONSTITUTIONAL: Well developed/well nourished HEAD: Normocephalic/atraumatic EYES: EOMI  ENMT: Mucous membranes moist NECK: supple no meningeal signs SPINE/BACK:lumbar spine tenderness noted. No bruising/crepitance/stepoffs noted to spine.  No bruising noted CV: S1/S2 noted, no murmurs/rubs/gallops noted LUNGS: Lungs are clear to auscultation bilaterally, no apparent distress ABDOMEN: soft, nontender, no rebound or guarding, bowel sounds noted throughout abdomen GU:no cva tenderness NEURO: Awake/alert, no saddle anesthesia, rectal tone present (chaperone present), equal distal motor 5/5 strength noted with the following: hip flexion/knee flexion/extension, ankle dorsi/plantar flexion, great toe extension intact bilaterally, no clonus bilaterally, plantar reflex appropriate (toes downgoing), no sensory deficit in any dermatome.  Equal patellar/achilles reflex noted (2+) in bilateral lower extremities.  Pt is able to ambulate unassisted. EXTREMITIES: pulses normal/equal, full ROM SKIN: warm, color normal PSYCH: no abnormalities of mood noted, alert and oriented to situation   ED Course  Procedures  DIAGNOSTIC STUDIES: Oxygen Saturation is 96% on RA, adequate by my interpretation.   COORDINATION OF CARE: 11:13 AM- Will perform rectal exam and order pain medication. Pt verbalizes understanding and agrees to plan.  Medications  HYDROmorphone (DILAUDID) injection 1 mg (1  mg Intramuscular Given 02/13/16 1143)    Labs Review Labs Reviewed  URINALYSIS, ROUTINE W REFLEX MICROSCOPIC (NOT AT [facility]) - Abnormal; Notable for the following:    Protein, ur TRACE (*)    All other components within normal limits  URINE MICROSCOPIC-ADD ON - Abnormal; Notable for the following:    Squamous Epithelial / LPF 0-5 (*)    Bacteria, UA FEW (*)    All other components within normal limits    Imaging Review Dg Lumbar Spine Complete  02/13/2016  CLINICAL DATA:  Left hip and low back pain. Numbness down both legs. 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 Therefore, L5 is a transitional vertebral body. Vacuum disc phenomena at L3-L4 and L4-L5. Mild disc space narrowing at L4-L5. There is facet arthropathy in the lower lumbar spine. No evidence for pars defect. Vertebral body heights are maintained. IMPRESSION: Degenerative facet and disc disease in lower lumbar spine. No acute bone abnormality. Electronically Signed   By: Markus Daft M.D.   On: 02/13/2016 13:00   Dg Hip Unilat With Pelvis 2-3 Views Left  02/13/2016  CLINICAL DATA:  Left hip pain EXAM: DG HIP (WITH OR WITHOUT PELVIS) 2-3V LEFT COMPARISON:  None. FINDINGS: Three views of the left hip submitted. No acute fracture or subluxation. Bilateral hip joints are symmetrical in appearance. Mild degenerative changes bilateral hip joints with mild superior acetabular spurring. Mild degenerative changes pubic symphysis. IMPRESSION: No acute fracture or subluxation. Mild degenerative changes as described above. Electronically Signed   By: Lahoma Crocker M.D.   On: 02/13/2016 12:55   I have personally reviewed and evaluated these images and lab results as part of my medical decision-making.   Pt improved She had reported loss of bowel after sleeping, but denies any incontinence since, she has appropriate rectal  tone and also no urinary retention as post void residual is near 0 She has no focal neuro deficits She can ambulate She does describe radicular pain into left hip/leg but ambulatory without weakness I feel she is safe for d/c home Work note given Advised to see PCP mid week if no improvement OTC meds for pain control We discussed strict return precautions  MDM   Final diagnoses:  Lumbar radiculopathy, acute  Acute hip pain, left    Nursing  notes including past medical history and social history reviewed and considered in documentation xrays/imaging reviewed by myself and considered during evaluation Labs/vital reviewed myself and considered during evaluation   I personally performed the services described in this documentation, which was scribed in my presence. The recorded information has been reviewed and is accurate.       Ripley Fraise, MD 02/13/16 1345

## 2016-02-18 DIAGNOSIS — L089 Local infection of the skin and subcutaneous tissue, unspecified: Secondary | ICD-10-CM | POA: Diagnosis not present

## 2016-02-18 DIAGNOSIS — I10 Essential (primary) hypertension: Secondary | ICD-10-CM | POA: Diagnosis not present

## 2016-03-08 DIAGNOSIS — M549 Dorsalgia, unspecified: Secondary | ICD-10-CM | POA: Diagnosis not present

## 2016-03-08 DIAGNOSIS — K1123 Chronic sialoadenitis: Secondary | ICD-10-CM | POA: Diagnosis not present

## 2016-03-24 ENCOUNTER — Ambulatory Visit (INDEPENDENT_AMBULATORY_CARE_PROVIDER_SITE_OTHER): Payer: Commercial Managed Care - HMO | Admitting: Otolaryngology

## 2016-03-24 DIAGNOSIS — D37030 Neoplasm of uncertain behavior of the parotid salivary glands: Secondary | ICD-10-CM

## 2016-03-25 ENCOUNTER — Inpatient Hospital Stay (HOSPITAL_COMMUNITY)
Admission: EM | Admit: 2016-03-25 | Discharge: 2016-03-28 | DRG: 392 | Disposition: A | Discharge status: Home / Self Care | Payer: Commercial Managed Care - HMO | Attending: Internal Medicine | Admitting: Internal Medicine

## 2016-03-25 ENCOUNTER — Emergency Department (HOSPITAL_COMMUNITY): Payer: Commercial Managed Care - HMO

## 2016-03-25 ENCOUNTER — Encounter (HOSPITAL_COMMUNITY): Payer: Self-pay | Admitting: Emergency Medicine

## 2016-03-25 ENCOUNTER — Other Ambulatory Visit (INDEPENDENT_AMBULATORY_CARE_PROVIDER_SITE_OTHER): Payer: Self-pay | Admitting: Otolaryngology

## 2016-03-25 ENCOUNTER — Inpatient Hospital Stay (HOSPITAL_COMMUNITY): Payer: Commercial Managed Care - HMO

## 2016-03-25 DIAGNOSIS — Z90710 Acquired absence of both cervix and uterus: Secondary | ICD-10-CM

## 2016-03-25 DIAGNOSIS — A059 Bacterial foodborne intoxication, unspecified: Secondary | ICD-10-CM | POA: Diagnosis not present

## 2016-03-25 DIAGNOSIS — Z8249 Family history of ischemic heart disease and other diseases of the circulatory system: Secondary | ICD-10-CM | POA: Diagnosis not present

## 2016-03-25 DIAGNOSIS — E860 Dehydration: Secondary | ICD-10-CM | POA: Diagnosis present

## 2016-03-25 DIAGNOSIS — B182 Chronic viral hepatitis C: Secondary | ICD-10-CM | POA: Diagnosis present

## 2016-03-25 DIAGNOSIS — R1110 Vomiting, unspecified: Secondary | ICD-10-CM | POA: Diagnosis not present

## 2016-03-25 DIAGNOSIS — E1165 Type 2 diabetes mellitus with hyperglycemia: Secondary | ICD-10-CM | POA: Diagnosis present

## 2016-03-25 DIAGNOSIS — E785 Hyperlipidemia, unspecified: Secondary | ICD-10-CM | POA: Diagnosis present

## 2016-03-25 DIAGNOSIS — Z811 Family history of alcohol abuse and dependence: Secondary | ICD-10-CM

## 2016-03-25 DIAGNOSIS — Z818 Family history of other mental and behavioral disorders: Secondary | ICD-10-CM

## 2016-03-25 DIAGNOSIS — F17210 Nicotine dependence, cigarettes, uncomplicated: Secondary | ICD-10-CM | POA: Diagnosis present

## 2016-03-25 DIAGNOSIS — J45909 Unspecified asthma, uncomplicated: Secondary | ICD-10-CM | POA: Diagnosis present

## 2016-03-25 DIAGNOSIS — Z7951 Long term (current) use of inhaled steroids: Secondary | ICD-10-CM

## 2016-03-25 DIAGNOSIS — Z809 Family history of malignant neoplasm, unspecified: Secondary | ICD-10-CM

## 2016-03-25 DIAGNOSIS — Z833 Family history of diabetes mellitus: Secondary | ICD-10-CM

## 2016-03-25 DIAGNOSIS — R739 Hyperglycemia, unspecified: Secondary | ICD-10-CM | POA: Diagnosis present

## 2016-03-25 DIAGNOSIS — K7460 Unspecified cirrhosis of liver: Secondary | ICD-10-CM | POA: Diagnosis not present

## 2016-03-25 DIAGNOSIS — R05 Cough: Secondary | ICD-10-CM | POA: Diagnosis not present

## 2016-03-25 DIAGNOSIS — N179 Acute kidney failure, unspecified: Secondary | ICD-10-CM | POA: Diagnosis present

## 2016-03-25 DIAGNOSIS — R112 Nausea with vomiting, unspecified: Secondary | ICD-10-CM | POA: Diagnosis not present

## 2016-03-25 DIAGNOSIS — K219 Gastro-esophageal reflux disease without esophagitis: Secondary | ICD-10-CM | POA: Diagnosis present

## 2016-03-25 DIAGNOSIS — F418 Other specified anxiety disorders: Secondary | ICD-10-CM | POA: Diagnosis present

## 2016-03-25 DIAGNOSIS — R1084 Generalized abdominal pain: Secondary | ICD-10-CM | POA: Diagnosis not present

## 2016-03-25 DIAGNOSIS — R110 Nausea: Secondary | ICD-10-CM | POA: Diagnosis not present

## 2016-03-25 DIAGNOSIS — Z7982 Long term (current) use of aspirin: Secondary | ICD-10-CM | POA: Diagnosis not present

## 2016-03-25 DIAGNOSIS — I110 Hypertensive heart disease with heart failure: Secondary | ICD-10-CM | POA: Diagnosis not present

## 2016-03-25 DIAGNOSIS — R109 Unspecified abdominal pain: Secondary | ICD-10-CM | POA: Diagnosis present

## 2016-03-25 DIAGNOSIS — I5032 Chronic diastolic (congestive) heart failure: Secondary | ICD-10-CM | POA: Diagnosis not present

## 2016-03-25 DIAGNOSIS — Z9049 Acquired absence of other specified parts of digestive tract: Secondary | ICD-10-CM

## 2016-03-25 DIAGNOSIS — E876 Hypokalemia: Secondary | ICD-10-CM | POA: Diagnosis present

## 2016-03-25 DIAGNOSIS — I10 Essential (primary) hypertension: Secondary | ICD-10-CM | POA: Diagnosis present

## 2016-03-25 DIAGNOSIS — K118 Other diseases of salivary glands: Secondary | ICD-10-CM

## 2016-03-25 DIAGNOSIS — R197 Diarrhea, unspecified: Secondary | ICD-10-CM | POA: Diagnosis present

## 2016-03-25 LAB — BASIC METABOLIC PANEL
Anion gap: 9 (ref 5–15)
BUN: 24 mg/dL — ABNORMAL HIGH (ref 6–20)
CO2: 22 mmol/L (ref 22–32)
Calcium: 7.8 mg/dL — ABNORMAL LOW (ref 8.9–10.3)
Chloride: 105 mmol/L (ref 101–111)
Creatinine, Ser: 1.07 mg/dL — ABNORMAL HIGH (ref 0.44–1.00)
GFR calc Af Amer: >60 mL/min (ref >60)
GFR calc non Af Amer: 52 mL/min — ABNORMAL LOW (ref >60)
Glucose, Bld: 118 mg/dL — ABNORMAL HIGH (ref 65–99)
Potassium: 2.7 mmol/L — CL (ref 3.5–5.1)
Sodium: 136 mmol/L (ref 135–145)

## 2016-03-25 LAB — COMPREHENSIVE METABOLIC PANEL
ALT: 16 U/L (ref 14–54)
AST: 30 U/L (ref 15–41)
Albumin: 4.1 g/dL (ref 3.5–5.0)
Alkaline Phosphatase: 110 U/L (ref 38–126)
Anion gap: 9 (ref 5–15)
BUN: 25 mg/dL — ABNORMAL HIGH (ref 6–20)
CO2: 21 mmol/L — ABNORMAL LOW (ref 22–32)
Calcium: 8.5 mg/dL — ABNORMAL LOW (ref 8.9–10.3)
Chloride: 106 mmol/L (ref 101–111)
Creatinine, Ser: 1.25 mg/dL — ABNORMAL HIGH (ref 0.44–1.00)
GFR calc Af Amer: 50 mL/min — ABNORMAL LOW (ref >60)
GFR calc non Af Amer: 43 mL/min — ABNORMAL LOW (ref >60)
Glucose, Bld: 121 mg/dL — ABNORMAL HIGH (ref 65–99)
Potassium: 2.9 mmol/L — ABNORMAL LOW (ref 3.5–5.1)
Sodium: 136 mmol/L (ref 135–145)
Total Bilirubin: 0.5 mg/dL (ref 0.3–1.2)
Total Protein: 9.4 g/dL — ABNORMAL HIGH (ref 6.5–8.1)

## 2016-03-25 LAB — URINALYSIS, ROUTINE W REFLEX MICROSCOPIC
Bilirubin Urine: NEGATIVE
Glucose, UA: NEGATIVE mg/dL
Hgb urine dipstick: NEGATIVE
Ketones, ur: NEGATIVE mg/dL
Leukocytes, UA: NEGATIVE
Nitrite: NEGATIVE
Protein, ur: NEGATIVE mg/dL
Specific Gravity, Urine: 1.015 (ref 1.005–1.030)
pH: 6 (ref 5.0–8.0)

## 2016-03-25 LAB — CBC WITH DIFFERENTIAL/PLATELET
Basophils Absolute: 0 10*3/uL (ref 0.0–0.1)
Basophils Relative: 0 %
Eosinophils Absolute: 0 10*3/uL (ref 0.0–0.7)
Eosinophils Relative: 0 %
HCT: 38.4 % (ref 36.0–46.0)
Hemoglobin: 12.7 g/dL (ref 12.0–15.0)
Lymphocytes Relative: 14 %
Lymphs Abs: 2.2 10*3/uL (ref 0.7–4.0)
MCH: 29.5 pg (ref 26.0–34.0)
MCHC: 33.1 g/dL (ref 30.0–36.0)
MCV: 89.1 fL (ref 78.0–100.0)
Monocytes Absolute: 0.6 10*3/uL (ref 0.1–1.0)
Monocytes Relative: 4 %
Neutro Abs: 12.4 10*3/uL — ABNORMAL HIGH (ref 1.7–7.7)
Neutrophils Relative %: 82 %
Platelets: 209 10*3/uL (ref 150–400)
RBC: 4.31 MIL/uL (ref 3.87–5.11)
RDW: 12.6 % (ref 11.5–15.5)
WBC: 15.2 10*3/uL — ABNORMAL HIGH (ref 4.0–10.5)

## 2016-03-25 LAB — TROPONIN I: Troponin I: <0.03 ng/mL (ref <0.03)

## 2016-03-25 LAB — MAGNESIUM: Magnesium: 1.4 mg/dL — ABNORMAL LOW (ref 1.7–2.4)

## 2016-03-25 LAB — LACTIC ACID, PLASMA: Lactic Acid, Venous: 1 mmol/L (ref 0.5–1.9)

## 2016-03-25 LAB — GLUCOSE, CAPILLARY: Glucose-Capillary: 127 mg/dL — ABNORMAL HIGH (ref 65–99)

## 2016-03-25 LAB — LIPASE, BLOOD: Lipase: 17 U/L (ref 11–51)

## 2016-03-25 LAB — PHOSPHORUS: Phosphorus: 2.9 mg/dL (ref 2.5–4.6)

## 2016-03-25 MED ORDER — ONDANSETRON HCL 4 MG/2ML IJ SOLN: 4.0000 mg | Freq: Four times a day (QID) | INTRAMUSCULAR | Status: DC | PRN

## 2016-03-25 MED ORDER — BECLOMETHASONE DIPROPIONATE 40 MCG/ACT IN AERS: 1.0000 | INHALATION_SPRAY | Freq: Two times a day (BID) | RESPIRATORY_TRACT | Status: DC | PRN

## 2016-03-25 MED ORDER — POTASSIUM CHLORIDE CRYS ER 20 MEQ PO TBCR
40.0000 meq | EXTENDED_RELEASE_TABLET | Freq: Once | ORAL | Status: AC
  Administered 2016-03-25: 40 meq via ORAL
  Filled 2016-03-25: qty 2

## 2016-03-25 MED ORDER — PAROXETINE HCL 20 MG PO TABS
20.0000 mg | ORAL_TABLET | Freq: Two times a day (BID) | ORAL | Status: DC
  Administered 2016-03-25 – 2016-03-28 (×6): 20 mg via ORAL
  Filled 2016-03-25 (×6): qty 1

## 2016-03-25 MED ORDER — POTASSIUM CHLORIDE CRYS ER 20 MEQ PO TBCR
40.0000 meq | EXTENDED_RELEASE_TABLET | Freq: Once | ORAL | Status: AC
  Administered 2016-03-26: 40 meq via ORAL
  Filled 2016-03-25: qty 2

## 2016-03-25 MED ORDER — LOSARTAN POTASSIUM-HCTZ 100-25 MG PO TABS: 1.0000 | ORAL_TABLET | Freq: Every day | ORAL | Status: DC

## 2016-03-25 MED ORDER — ONDANSETRON HCL 4 MG PO TABS: 4.0000 mg | ORAL_TABLET | Freq: Four times a day (QID) | ORAL | Status: DC | PRN

## 2016-03-25 MED ORDER — SODIUM CHLORIDE 0.9% FLUSH
3.0000 mL | Freq: Two times a day (BID) | INTRAVENOUS | Status: DC
  Administered 2016-03-25 – 2016-03-27 (×5): 3 mL via INTRAVENOUS

## 2016-03-25 MED ORDER — METOCLOPRAMIDE HCL 5 MG/ML IJ SOLN: 10.0000 mg | Freq: Three times a day (TID) | INTRAMUSCULAR | Status: DC | PRN

## 2016-03-25 MED ORDER — BUDESONIDE 0.25 MG/2ML IN SUSP
0.2500 mg | Freq: Two times a day (BID) | RESPIRATORY_TRACT | Status: DC | PRN
  Administered 2016-03-26: 0.25 mg via RESPIRATORY_TRACT
  Filled 2016-03-25: qty 2

## 2016-03-25 MED ORDER — MORPHINE SULFATE (PF) 2 MG/ML IV SOLN
2.0000 mg | Freq: Once | INTRAVENOUS | Status: AC
  Administered 2016-03-25: 2 mg via INTRAVENOUS
  Filled 2016-03-25: qty 1

## 2016-03-25 MED ORDER — SODIUM CHLORIDE 0.9 % IV SOLN: 1000.0000 mL | Freq: Once | INTRAVENOUS | Status: DC

## 2016-03-25 MED ORDER — ENOXAPARIN SODIUM 40 MG/0.4ML ~~LOC~~ SOLN
40.0000 mg | SUBCUTANEOUS | Status: DC
  Administered 2016-03-25 – 2016-03-27 (×3): 40 mg via SUBCUTANEOUS
  Filled 2016-03-25 (×3): qty 0.4

## 2016-03-25 MED ORDER — SODIUM CHLORIDE 0.9 % IV SOLN
1000.0000 mL | INTRAVENOUS | Status: DC
  Administered 2016-03-25: 1000 mL via INTRAVENOUS

## 2016-03-25 MED ORDER — POTASSIUM CHLORIDE IN NACL 40-0.9 MEQ/L-% IV SOLN
INTRAVENOUS | Status: DC
  Administered 2016-03-25: 75 mL/h via INTRAVENOUS

## 2016-03-25 MED ORDER — MAGNESIUM SULFATE 2 GM/50ML IV SOLN
2.0000 g | Freq: Once | INTRAVENOUS | Status: DC
  Filled 2016-03-25: qty 50

## 2016-03-25 MED ORDER — SODIUM CHLORIDE 0.9 % IV SOLN
1000.0000 mL | Freq: Once | INTRAVENOUS | Status: AC
  Administered 2016-03-25: 1000 mL via INTRAVENOUS

## 2016-03-25 MED ORDER — METRONIDAZOLE IN NACL 5-0.79 MG/ML-% IV SOLN
500.0000 mg | Freq: Three times a day (TID) | INTRAVENOUS | Status: DC
  Administered 2016-03-25 – 2016-03-28 (×8): 500 mg via INTRAVENOUS
  Filled 2016-03-25 (×8): qty 100

## 2016-03-25 MED ORDER — IOPAMIDOL (ISOVUE-300) INJECTION 61%
100.0000 mL | Freq: Once | INTRAVENOUS | Status: AC | PRN
  Administered 2016-03-25: 100 mL via INTRAVENOUS

## 2016-03-25 MED ORDER — ACETAMINOPHEN 650 MG RE SUPP: 650.0000 mg | Freq: Four times a day (QID) | RECTAL | Status: DC | PRN

## 2016-03-25 MED ORDER — ACETAMINOPHEN 325 MG PO TABS
650.0000 mg | ORAL_TABLET | Freq: Four times a day (QID) | ORAL | Status: DC | PRN
  Administered 2016-03-25: 650 mg via ORAL
  Filled 2016-03-25: qty 2

## 2016-03-25 MED ORDER — PROCHLORPERAZINE EDISYLATE 5 MG/ML IJ SOLN
5.0000 mg | Freq: Once | INTRAMUSCULAR | Status: AC
  Administered 2016-03-25: 5 mg via INTRAVENOUS
  Filled 2016-03-25: qty 2

## 2016-03-25 MED ORDER — OXYCODONE HCL 5 MG PO TABS
5.0000 mg | ORAL_TABLET | ORAL | Status: DC | PRN
  Administered 2016-03-26 – 2016-03-27 (×3): 5 mg via ORAL
  Filled 2016-03-25 (×3): qty 1

## 2016-03-25 MED ORDER — IPRATROPIUM-ALBUTEROL 0.5-2.5 (3) MG/3ML IN SOLN
3.0000 mL | Freq: Once | RESPIRATORY_TRACT | Status: AC
  Administered 2016-03-25: 3 mL via RESPIRATORY_TRACT
  Filled 2016-03-25: qty 3

## 2016-03-25 MED ORDER — SODIUM CHLORIDE 0.9 % IV SOLN: 1000.0000 mL | INTRAVENOUS | Status: DC

## 2016-03-25 MED ORDER — ASPIRIN EC 81 MG PO TBEC
81.0000 mg | DELAYED_RELEASE_TABLET | Freq: Every day | ORAL | Status: DC
  Administered 2016-03-26 – 2016-03-28 (×3): 81 mg via ORAL
  Filled 2016-03-25 (×3): qty 1

## 2016-03-25 MED ORDER — DIATRIZOATE MEGLUMINE & SODIUM 66-10 % PO SOLN
ORAL | Status: AC
  Filled 2016-03-25: qty 30

## 2016-03-25 MED ORDER — ALBUTEROL SULFATE (2.5 MG/3ML) 0.083% IN NEBU
3.0000 mL | INHALATION_SOLUTION | Freq: Four times a day (QID) | RESPIRATORY_TRACT | Status: DC | PRN
  Administered 2016-03-26: 3 mL via RESPIRATORY_TRACT
  Filled 2016-03-25: qty 3

## 2016-03-25 MED ORDER — HYDROCHLOROTHIAZIDE 25 MG PO TABS
25.0000 mg | ORAL_TABLET | Freq: Every day | ORAL | Status: DC
  Administered 2016-03-26 – 2016-03-28 (×3): 25 mg via ORAL
  Filled 2016-03-25 (×3): qty 1

## 2016-03-25 MED ORDER — ALPRAZOLAM 0.5 MG PO TABS
0.5000 mg | ORAL_TABLET | Freq: Two times a day (BID) | ORAL | Status: DC | PRN
  Administered 2016-03-26 – 2016-03-27 (×3): 0.5 mg via ORAL
  Filled 2016-03-25 (×3): qty 1

## 2016-03-25 MED ORDER — ACETAMINOPHEN 500 MG PO TABS
1000.0000 mg | ORAL_TABLET | Freq: Once | ORAL | Status: AC
  Administered 2016-03-25: 1000 mg via ORAL

## 2016-03-25 MED ORDER — FUROSEMIDE 40 MG PO TABS
40.0000 mg | ORAL_TABLET | Freq: Every day | ORAL | Status: DC
  Administered 2016-03-26 – 2016-03-28 (×3): 40 mg via ORAL
  Filled 2016-03-25 (×3): qty 1

## 2016-03-25 MED ORDER — LOSARTAN POTASSIUM 50 MG PO TABS
100.0000 mg | ORAL_TABLET | Freq: Every day | ORAL | Status: DC
  Administered 2016-03-26 – 2016-03-28 (×3): 100 mg via ORAL
  Filled 2016-03-25 (×3): qty 2

## 2016-03-25 MED ORDER — ACETAMINOPHEN 500 MG PO TABS
ORAL_TABLET | ORAL | Status: AC
  Filled 2016-03-25: qty 2

## 2016-03-25 MED ORDER — OMEGA-3-ACID ETHYL ESTERS 1 G PO CAPS
1000.0000 mg | ORAL_CAPSULE | Freq: Every morning | ORAL | Status: DC
  Administered 2016-03-26 – 2016-03-28 (×3): 1000 mg via ORAL
  Filled 2016-03-25 (×3): qty 1

## 2016-03-25 MED ORDER — POTASSIUM CHLORIDE 10 MEQ/100ML IV SOLN
10.0000 meq | Freq: Once | INTRAVENOUS | Status: AC
  Administered 2016-03-25: 10 meq via INTRAVENOUS
  Filled 2016-03-25: qty 100

## 2016-03-25 NOTE — ED Notes (Signed)
CRITICAL VALUE ALERT  Critical value received:  K 2.7 Date of notification:  03/25/16  Time of notification:  1500  Critical value read back: yes     Nurse who received alert:  Sheral Flow  MD notified (1st page):  Lemmie Evens Marshell Levan Rogene Houston

## 2016-03-25 NOTE — ED Provider Notes (Signed)
[facility] DEPT Provider Note   CSN: [id number] Arrival date & time: 03/25/16  1010  First Provider Contact:  None       History   Chief Complaint Chief Complaint  Patient presents with  . Abdominal Pain    HPI Megan Odom is a 70 y.o. female.  HPI  Past Medical History:  Diagnosis Date  . Arthritis   . Asthma   . Depression   . Hep C w/o coma, chronic ([place])   . Hyperlipemia   . Hypertension   . Type 2 diabetes mellitus [facility])     Patient Active Problem List   Diagnosis Date Noted  . Headache 11/20/2014  . Dizziness 11/20/2014  . Hepatic cirrhosis ([place]) 11/05/2014  . Chest pain 11/05/2014  . Dysphagia 11/05/2014  . History of hepatitis C 11/05/2014  . Sleep apnea, obstructive 01/17/2013  . Paroxysmal atrial tachycardia ([place]) 01/17/2013  . HTN (hypertension) 01/17/2013  . Morbid obesity ([place]) 01/17/2013  . Chest pain at rest 01/17/2013  . Difficulty in walking(719.7) 02/17/2012  . Bilateral leg weakness 02/17/2012  . Back pain 02/16/2012  . Bilateral anterior knee pain 02/16/2012  . Knee pain, left 02/16/2012    Past Surgical History:  Procedure Laterality Date  . ABDOMINAL HYSTERECTOMY    . APPENDECTOMY    . BREAST SURGERY    . CHOLECYSTECTOMY    . COLONOSCOPY N/A 07/03/2014   treated/removed as above    OB History    Gravida Para Term Preterm AB Living   4 2 2   2 2    SAB TAB Ectopic Multiple Live Births   2               Home Medications    Prior to Admission medications   Medication Sig Start Date End Date Taking? Authorizing Provider  albuterol (ACCUNEB) 0.63 MG/3ML nebulizer solution Take 3 mLs by nebulization every 6 (six) hours as needed for wheezing.   Yes Historical Provider, MD  albuterol (PROVENTIL HFA;VENTOLIN HFA) 108 (90 BASE) MCG/ACT inhaler Inhale 1-2 puffs into the lungs every 6 (six) hours as needed for wheezing. 05/11/15  Yes Tanna Furry, MD  ALPRAZolam Duanne Moron) 0.5 MG tablet Take 1 tablet by mouth 2 (two) times  daily as needed for anxiety.  08/18/15  Yes Historical Provider, MD  aspirin EC 81 MG tablet Take 81 mg by mouth daily.     Yes Historical Provider, MD  beclomethasone (QVAR) 40 MCG/ACT inhaler Inhale 1 puff into the lungs 2 (two) times daily as needed (shortness of breath).    Yes Historical Provider, MD  clotrimazole-betamethasone (LOTRISONE) cream Apply 1 application topically daily as needed. rash 02/06/16  Yes Historical Provider, MD  ergocalciferol (VITAMIN D2) 50000 UNITS capsule Take 50,000 Units by mouth once a week. sunday   Yes Historical Provider, MD  fluticasone (FLONASE) 50 MCG/ACT nasal spray Place 2 sprays into the nose daily as needed for allergies.    Yes Historical Provider, MD  furosemide (LASIX) 40 MG tablet Take 40 mg by mouth daily.     Yes Historical Provider, MD  HYDROcodone-acetaminophen (NORCO/VICODIN) 5-325 MG tablet Take 1 tablet by mouth 2 (two) times daily as needed for moderate pain.  03/08/16  Yes Historical Provider, MD  ibuprofen (ADVIL,MOTRIN) 800 MG tablet Take 800 mg by mouth every 8 (eight) hours as needed for mild pain.    Yes Historical Provider, MD  latanoprost (XALATAN) 0.005 % ophthalmic solution Place 1 drop into the right eye at bedtime.  Yes Historical Provider, MD  loratadine (CLARITIN) 10 MG tablet Take 10 mg by mouth daily.    Yes Historical Provider, MD  losartan-hydrochlorothiazide (HYZAAR) 100-25 MG per tablet Take 1 tablet by mouth daily.   Yes Historical Provider, MD  Multiple Vitamin (MULTIVITAMIN WITH MINERALS) TABS tablet Take 1 tablet by mouth daily.   Yes Historical Provider, MD  Omega-3 Fatty Acids (OMEGA-3 FISH OIL) 1200 MG CAPS Take 1,200 mg by mouth every morning.   Yes Historical Provider, MD  omeprazole (PRILOSEC) 40 MG capsule Take 40 mg by mouth 2 (two) times daily.    Yes Historical Provider, MD  PARoxetine (PAXIL) 20 MG tablet Take 20 mg by mouth 2 (two) times daily.   Yes Historical Provider, MD  Phenyleph-CPM-DM-Aspirin  (ALKA-SELTZER PLUS COLD & COUGH PO) Take 2 capsules by mouth every 6 (six) hours as needed (cough/cold).   Yes Historical Provider, MD  potassium chloride (K-DUR) 10 MEQ tablet Take 10 mEq by mouth daily.   Yes Historical Provider, MD  Pseudoeph-Doxylamine-DM-APAP (DAYQUIL/NYQUIL COLD/FLU RELIEF PO) Take 2 capsules by mouth every 6 (six) hours as needed (cough/congestion).   Yes Historical Provider, MD    Family History Family History  Problem Relation Age of Onset  . Alcohol abuse Mother   . Early death Mother   . Early death Father   . Alcohol abuse Sister   . HIV Brother   . Diabetes Brother   . Heart disease Brother   . Hepatitis C Daughter   . Hypertension Son   . Heart disease Brother   . Diabetes Brother   . Cancer Maternal Aunt   . Hypertension Maternal Aunt   . Heart disease Maternal Aunt   . Cancer Maternal Uncle   . Hypertension Maternal Uncle     Social History Social History  Substance Use Topics  . Smoking status: Current Every Day Smoker    Packs/day: 0.50    Years: 50.00  . Smokeless tobacco: Never Used     Comment: one pack every 2-3 days  . Alcohol use No     Allergies   Darvocet [propoxyphene n-acetaminophen]; Latex; Other; Penicillins; Tetracyclines & related; and Adhesive [tape]   Review of Systems Review of Systems   Physical Exam Updated Vital Signs BP 139/67 (BP Location: Left Arm)   Pulse 63   Temp 99.9 F (37.7 C) (Oral)   Resp 18   Ht 5\' 5"  (1.651 m)   Wt 104.8 kg   SpO2 96%   BMI 38.44 kg/m   Physical Exam   ED Treatments / Results  Labs (all labs ordered are listed, but only abnormal results are displayed) Labs Reviewed  CBC WITH DIFFERENTIAL/PLATELET - Abnormal; Notable for the following:       Result Value   WBC 15.2 (*)    Neutro Abs 12.4 (*)    All other components within normal limits  COMPREHENSIVE METABOLIC PANEL - Abnormal; Notable for the following:    Potassium 2.9 (*)    CO2 21 (*)    Glucose, Bld 121  (*)    BUN 25 (*)    Creatinine, Ser 1.25 (*)    Calcium 8.5 (*)    Total Protein 9.4 (*)    GFR calc non Af Amer 43 (*)    GFR calc Af Amer 50 (*)    All other components within normal limits  LIPASE, BLOOD  TROPONIN I  URINALYSIS, ROUTINE W REFLEX MICROSCOPIC (NOT AT [facility])    EKG  EKG  Interpretation  Date/Time:  Friday March 25 2016 11:05:47 EDT Ventricular Rate:  61 PR Interval:    QRS Duration: 110 QT Interval:  402 QTC Calculation: 405 R Axis:   -43 Text Interpretation:  Sinus rhythm Abnormal R-wave progression, early transition Left ventricular hypertrophy Confirmed by ZACKOWSKI  MD, SCOTT ([id number]) on 03/25/2016 11:20:14 AM       Radiology No results found.  Procedures Procedures (including critical care time)  Medications Ordered in ED Medications  prochlorperazine (COMPAZINE) injection 5 mg (5 mg Intravenous Given 03/25/16 1105)  morphine 2 MG/ML injection 2 mg (2 mg Intravenous Given 03/25/16 1104)  ipratropium-albuterol (DUONEB) 0.5-2.5 (3) MG/3ML nebulizer solution 3 mL (3 mLs Nebulization Given 03/25/16 1114)     Initial Impression / Assessment and Plan / ED Course  Pt seen with me by Dr Rogene Houston.  No vomiting after compazine, but continues to have diarrhea. Wheezing improved after treatment, but not resolved.  Potassium low, oral potassium given.  Bmet recheck after fluids reveals the potassium lower at 2.7. Pt seen with me again by Dr Rogene Houston. Will discuss with hospitalist for admission. N/V improved, but pt having some diarrhea.  Case discussed with Hospitalist. Pt to be given a additional dose of oral potassium along with IV KCl, check magnesium, and give additional fluids. Pt to receive IV mag if abnormal. Hospitalist team will see pt in the ED.  I have reviewed the triage vital signs and the nursing notes.  Pertinent labs & imaging results that were available during my care of the patient were reviewed by me and considered in my medical decision  making (see chart for details).  Clinical Course    **I have reviewed nursing notes, vital signs, and all appropriate lab and imaging results for this patient.*  Final Clinical Impressions(s) / ED Diagnoses  Vital signs reviewed. Patient evaluated by the hospitalist. Patient to be admitted for additional fluids, and management of the low potassium and low magnesium.    Final diagnoses:  Intractable abdominal pain    New Prescriptions New Prescriptions   No medications on file     Lily Kocher, [place] 03/25/16 1732

## 2016-03-25 NOTE — ED Notes (Signed)
Hospitalist at bedside  And notified of Temp 101.2 New order 1 gram of tylenol po

## 2016-03-25 NOTE — ED Provider Notes (Signed)
Medical screening examination/treatment/procedure(s) were conducted as a shared visit with non-physician practitioner(s) and myself.  I personally evaluated the patient during the encounter.   EKG Interpretation  Date/Time:  Friday March 25 2016 11:05:47 EDT Ventricular Rate:  61 PR Interval:    QRS Duration: 110 QT Interval:  402 QTC Calculation: 405 R Axis:   -43 Text Interpretation:  Sinus rhythm Abnormal R-wave progression, early transition Left ventricular hypertrophy Confirmed by Rogene Houston  MD, Tyreque Finken [phone]) on 03/25/2016 11:20:14 AM        Results for orders placed or performed during the hospital encounter of 03/25/16  CBC with Differential  Result Value Ref Range   WBC 15.2 (H) 4.0 - 10.5 K/uL   RBC 4.31 3.87 - 5.11 MIL/uL   Hemoglobin 12.7 12.0 - 15.0 g/dL   HCT 38.4 36.0 - 46.0 %   MCV 89.1 78.0 - 100.0 fL   MCH 29.5 26.0 - 34.0 pg   MCHC 33.1 30.0 - 36.0 g/dL   RDW 12.6 11.5 - 15.5 %   Platelets 209 150 - 400 K/uL   Neutrophils Relative % 82 %   Neutro Abs 12.4 (H) 1.7 - 7.7 K/uL   Lymphocytes Relative 14 %   Lymphs Abs 2.2 0.7 - 4.0 K/uL   Monocytes Relative 4 %   Monocytes Absolute 0.6 0.1 - 1.0 K/uL   Eosinophils Relative 0 %   Eosinophils Absolute 0.0 0.0 - 0.7 K/uL   Basophils Relative 0 %   Basophils Absolute 0.0 0.0 - 0.1 K/uL  Comprehensive metabolic panel  Result Value Ref Range   Sodium 136 135 - 145 mmol/L   Potassium 2.9 (L) 3.5 - 5.1 mmol/L   Chloride 106 101 - 111 mmol/L   CO2 21 (L) 22 - 32 mmol/L   Glucose, Bld 121 (H) 65 - 99 mg/dL   BUN 25 (H) 6 - 20 mg/dL   Creatinine, Ser 1.25 (H) 0.44 - 1.00 mg/dL   Calcium 8.5 (L) 8.9 - 10.3 mg/dL   Total Protein 9.4 (H) 6.5 - 8.1 g/dL   Albumin 4.1 3.5 - 5.0 g/dL   AST 30 15 - 41 U/L   ALT 16 14 - 54 U/L   Alkaline Phosphatase 110 38 - 126 U/L   Total Bilirubin 0.5 0.3 - 1.2 mg/dL   GFR calc non Af Amer 43 (L) >60 mL/min   GFR calc Af Amer 50 (L) >60 mL/min   Anion gap 9 5 - 15  Lipase, blood   Result Value Ref Range   Lipase 17 11 - 51 U/L  Urinalysis, Routine w reflex microscopic (not at [facility])  Result Value Ref Range   Color, Urine YELLOW YELLOW   APPearance CLEAR CLEAR   Specific Gravity, Urine 1.015 1.005 - 1.030   pH 6.0 5.0 - 8.0   Glucose, UA NEGATIVE NEGATIVE mg/dL   Hgb urine dipstick NEGATIVE NEGATIVE   Bilirubin Urine NEGATIVE NEGATIVE   Ketones, ur NEGATIVE NEGATIVE mg/dL   Protein, ur NEGATIVE NEGATIVE mg/dL   Nitrite NEGATIVE NEGATIVE   Leukocytes, UA NEGATIVE NEGATIVE  Troponin I  Result Value Ref Range   Troponin I <0.03 <0.03 ng/mL  Basic metabolic panel  Result Value Ref Range   Sodium 136 135 - 145 mmol/L   Potassium 2.7 (LL) 3.5 - 5.1 mmol/L   Chloride 105 101 - 111 mmol/L   CO2 22 22 - 32 mmol/L   Glucose, Bld 118 (H) 65 - 99 mg/dL   BUN 24 (H) 6 -  20 mg/dL   Creatinine, Ser 1.07 (H) 0.44 - 1.00 mg/dL   Calcium 7.8 (L) 8.9 - 10.3 mg/dL   GFR calc non Af Amer 52 (L) >60 mL/min   GFR calc Af Amer >60 >60 mL/min   Anion gap 9 5 - 15      Patient seen by me. Patient's symptoms seem to be consistent with acute onset of gastroenteritis last evening. Patient had little Kathrynn Speed but other family members had little Blanchie Serve no one else is sick. Patient with here with hypokalemia. Exacerbated them sure by the vomiting and diarrhea. Last episode of vomiting was right prior to arrival. But diarrhea has continued. No blood in either one. Patient with some generalized abdominal pain complaint. But patient without any focal or significant tenderness to the abdomen. Patient does have a leukocytosis. Would recommend out some IV potassium in discussion with hospitalist for admission continued hydration and potassium replacement.     Fredia Sorrow, MD 03/25/16 [phone]

## 2016-03-25 NOTE — ED Triage Notes (Signed)
Pt reports n/v/d since yesterday.  Pt uncooperative at triage.

## 2016-03-25 NOTE — ED Notes (Signed)
Pt voided small amount of urine per lab, not enough urine . Will reattempt later

## 2016-03-25 NOTE — H&P (Signed)
History and Physical    CARLIANA LEINWEBER [id number] DOB: 1945-08-26 DOA: 03/25/2016  PCP: Rosita Fire, MD Patient coming from: home  Chief Complaint: Abd pain, n/v/d  HPI: Megan Odom is a 70 y.o. female with medical history significant of his mother, depression, HLD, HTN, diabetes, hepatitis C the patient states that she has been cured of this after taking medications recently. Patient states that her symptoms started last night shortly after eating pizza in [place]. States that she was up basically entire night with severe nausea vomiting and diarrhea. States that she filled out her trash can multiple times with vomiting and had so much watery stool that she cannot count the number of bowel movements she's had the last 24 hours. Abdominal pain is crampy and diffuse. No focal area. This is worse with certain movements. Has not tried anything for her symptoms. No oral intake since onset. Patient is status post cholecystectomy and appendectomy. Denies any chest pain, shortness of breath, palpitations, neck stiffness, headache, fever, dysuria, frequency. Endorses general malaise. Patient states she had antibiotics for an infection approximately 4 weeks ago.   ED Course: Given Zofran and 2 L normal saline bolus with some improvement. Objective findings below. Initially we were consulted for admission simply for hypokalemia.  Review of Systems: As per HPI otherwise 10 point review of systems negative.   Ambulatory Status: no restrictions  Past Medical History:  Diagnosis Date  . Arthritis   . Asthma   . Depression   . Hep C w/o coma, chronic ([place])   . Hyperlipemia   . Hypertension   . Type 2 diabetes mellitus ([place])     Past Surgical History:  Procedure Laterality Date  . ABDOMINAL HYSTERECTOMY    . APPENDECTOMY    . BREAST SURGERY    . CHOLECYSTECTOMY    . COLONOSCOPY N/A 07/03/2014   treated/removed as above    Social History   Social History  . Marital status:  Widowed    Spouse name: N/A  . Number of children: N/A  . Years of education: GED   Occupational History  .  [employer]   Social History Main Topics  . Smoking status: Current Every Day Smoker    Packs/day: 0.50    Years: 50.00  . Smokeless tobacco: Never Used     Comment: one pack every 2-3 days  . Alcohol use No  . Drug use: No  . Sexual activity: Not Currently    Birth control/ protection: Surgical   Other Topics Concern  . Not on file   Social History Narrative  . No narrative on file    Allergies  Allergen Reactions  . Darvocet [Propoxyphene N-Acetaminophen] Other (See Comments)    Hurts ears and side of face   . Latex Swelling  . Other Other (See Comments)    Hairspray(isoaplus)-causes big soars in head  . Penicillins Other (See Comments)    Has patient had a PCN reaction causing immediate rash, facial/tongue/throat swelling, SOB or lightheadedness with hypotension: No Has patient had a PCN reaction causing severe rash involving mucus membranes or skin necrosis: No Has patient had a PCN reaction that required hospitalization No Has patient had a PCN reaction occurring within the last 10 years: No If all of the above answers are "NO", then may proceed with Cephalosporin use. Hurts ears and side of face   . Tetracyclines & Related Other (See Comments)    Hurts ears and side of face  . Adhesive [Tape]  Itching and Rash    Telemetry pads cause itching and rash at site    Family History  Problem Relation Age of Onset  . Alcohol abuse Mother   . Early death Mother   . Early death Father   . Alcohol abuse Sister   . HIV Brother   . Diabetes Brother   . Heart disease Brother   . Hepatitis C Daughter   . Hypertension Son   . Heart disease Brother   . Diabetes Brother   . Cancer Maternal Aunt   . Hypertension Maternal Aunt   . Heart disease Maternal Aunt   . Cancer Maternal Uncle   . Hypertension Maternal Uncle     Prior to Admission  medications   Medication Sig Start Date End Date Taking? Authorizing Provider  albuterol (ACCUNEB) 0.63 MG/3ML nebulizer solution Take 3 mLs by nebulization every 6 (six) hours as needed for wheezing.   Yes Historical Provider, MD  albuterol (PROVENTIL HFA;VENTOLIN HFA) 108 (90 BASE) MCG/ACT inhaler Inhale 1-2 puffs into the lungs every 6 (six) hours as needed for wheezing. 05/11/15  Yes Tanna Furry, MD  ALPRAZolam Duanne Moron) 0.5 MG tablet Take 1 tablet by mouth 2 (two) times daily as needed for anxiety.  08/18/15  Yes Historical Provider, MD  aspirin EC 81 MG tablet Take 81 mg by mouth daily.     Yes Historical Provider, MD  beclomethasone (QVAR) 40 MCG/ACT inhaler Inhale 1 puff into the lungs 2 (two) times daily as needed (shortness of breath).    Yes Historical Provider, MD  clotrimazole-betamethasone (LOTRISONE) cream Apply 1 application topically daily as needed. rash 02/06/16  Yes Historical Provider, MD  ergocalciferol (VITAMIN D2) 50000 UNITS capsule Take 50,000 Units by mouth once a week. sunday   Yes Historical Provider, MD  fluticasone (FLONASE) 50 MCG/ACT nasal spray Place 2 sprays into the nose daily as needed for allergies.    Yes Historical Provider, MD  furosemide (LASIX) 40 MG tablet Take 40 mg by mouth daily.     Yes Historical Provider, MD  HYDROcodone-acetaminophen (NORCO/VICODIN) 5-325 MG tablet Take 1 tablet by mouth 2 (two) times daily as needed for moderate pain.  03/08/16  Yes Historical Provider, MD  ibuprofen (ADVIL,MOTRIN) 800 MG tablet Take 800 mg by mouth every 8 (eight) hours as needed for mild pain.    Yes Historical Provider, MD  latanoprost (XALATAN) 0.005 % ophthalmic solution Place 1 drop into the right eye at bedtime.   Yes Historical Provider, MD  loratadine (CLARITIN) 10 MG tablet Take 10 mg by mouth daily.    Yes Historical Provider, MD  losartan-hydrochlorothiazide (HYZAAR) 100-25 MG per tablet Take 1 tablet by mouth daily.   Yes Historical Provider, MD  Multiple  Vitamin (MULTIVITAMIN WITH MINERALS) TABS tablet Take 1 tablet by mouth daily.   Yes Historical Provider, MD  Omega-3 Fatty Acids (OMEGA-3 FISH OIL) 1200 MG CAPS Take 1,200 mg by mouth every morning.   Yes Historical Provider, MD  omeprazole (PRILOSEC) 40 MG capsule Take 40 mg by mouth 2 (two) times daily.    Yes Historical Provider, MD  PARoxetine (PAXIL) 20 MG tablet Take 20 mg by mouth 2 (two) times daily.   Yes Historical Provider, MD  Phenyleph-CPM-DM-Aspirin (ALKA-SELTZER PLUS COLD & COUGH PO) Take 2 capsules by mouth every 6 (six) hours as needed (cough/cold).   Yes Historical Provider, MD  potassium chloride (K-DUR) 10 MEQ tablet Take 10 mEq by mouth daily.   Yes Historical Provider, MD  Pseudoeph-Doxylamine-DM-APAP (  DAYQUIL/NYQUIL COLD/FLU RELIEF PO) Take 2 capsules by mouth every 6 (six) hours as needed (cough/congestion).   Yes Historical Provider, MD    Physical Exam: Vitals:   03/25/16 1500 03/25/16 1502 03/25/16 1530 03/25/16 1635  BP: 118/78 118/78 133/64   Pulse:  64    Resp:  20    Temp:    101.2 F (38.4 C)  TempSrc:    Oral  SpO2:  97%    Weight:      Height:         General: Appears uncomfortable. Patient unable to rest comfortably in bed.  Eyes: bloodshot, EOMI ENT: Dry mucous members, poor dentition. Neck:  no LAD, masses or thyromegaly Cardiovascular:  RRR, no m/r/g. No LE edema.  Respiratory:  CTA bilaterally, no w/r/r. Normal respiratory effort. Abdomen: Diffusely tender to deep palpation, hyperactive bowel sounds, mild distention Skin:  no rash or induration seen on limited exam Musculoskeletal:  grossly normal tone BUE/BLE, good ROM, no bony abnormality Psychiatric:  grossly normal mood and affect, speech fluent and appropriate, AOx3 Neurologic:  CN 2-12 grossly intact, moves all extremities in coordinated fashion, sensation intact  Labs on Admission: I have personally reviewed following labs and imaging studies  CBC:  Recent Labs Lab 03/25/16 1111   WBC 15.2*  NEUTROABS 12.4*  HGB 12.7  HCT 38.4  MCV 89.1  PLT [id number]   Basic Metabolic Panel:  Recent Labs Lab 03/25/16 1111 03/25/16 1430  NA 136 136  K 2.9* 2.7*  CL 106 105  CO2 21* 22  GLUCOSE 121* 118*  BUN 25* 24*  CREATININE 1.25* 1.07*  CALCIUM 8.5* 7.8*  MG  --  1.4*   GFR: Estimated Creatinine Clearance: 60.5 mL/min (by C-G formula based on SCr of 1.07 mg/dL). Liver Function Tests:  Recent Labs Lab 03/25/16 1111  AST 30  ALT 16  ALKPHOS 110  BILITOT 0.5  PROT 9.4*  ALBUMIN 4.1    Recent Labs Lab 03/25/16 1111  LIPASE 17   No results for input(s): AMMONIA in the last 168 hours. Coagulation Profile: No results for input(s): INR, PROTIME in the last 168 hours. Cardiac Enzymes:  Recent Labs Lab 03/25/16 1111  TROPONINI <0.03   BNP (last 3 results) No results for input(s): PROBNP in the last 8760 hours. HbA1C: No results for input(s): HGBA1C in the last 72 hours. CBG: No results for input(s): GLUCAP in the last 168 hours. Lipid Profile: No results for input(s): CHOL, HDL, LDLCALC, TRIG, CHOLHDL, LDLDIRECT in the last 72 hours. Thyroid Function Tests: No results for input(s): TSH, T4TOTAL, FREET4, T3FREE, THYROIDAB in the last 72 hours. Anemia Panel: No results for input(s): VITAMINB12, FOLATE, FERRITIN, TIBC, IRON, RETICCTPCT in the last 72 hours. Urine analysis:    Component Value Date/Time   COLORURINE YELLOW 03/25/2016 [place] 03/25/2016 1353   LABSPEC 1.015 03/25/2016 1353   PHURINE 6.0 03/25/2016 1353   GLUCOSEU NEGATIVE 03/25/2016 1353   HGBUR NEGATIVE 03/25/2016 1353   BILIRUBINUR NEGATIVE 03/25/2016 1353   KETONESUR NEGATIVE 03/25/2016 1353   PROTEINUR NEGATIVE 03/25/2016 1353   UROBILINOGEN 2.0 (H) 12/09/2014 1200   NITRITE NEGATIVE 03/25/2016 1353   LEUKOCYTESUR NEGATIVE 03/25/2016 1353    Creatinine Clearance: Estimated Creatinine Clearance: 60.5 mL/min (by C-G formula based on SCr of 1.07  mg/dL).  Sepsis Labs: @LABRCNTIP (procalcitonin:4,lacticidven:4) )No results found for this or any previous visit (from the past 240 hour(s)).   Radiological Exams on Admission: Dg Chest 2 View  Result Date: 03/25/2016  CLINICAL DATA:  Cough and congestion.  Nausea and vomiting. EXAM: CHEST  2 VIEW COMPARISON:  11/05/2015 FINDINGS: Heart size is normal. There is no pleural effusion or edema. Aortic atherosclerosis is noted. No airspace consolidation. IMPRESSION: 1. No acute findings. 2. Aortic atherosclerosis. Electronically Signed   By: Kerby Moors M.D.   On: 03/25/2016 12:14      Assessment/Plan Active Problems:   Intractable abdominal pain   Nausea vomiting and diarrhea   AKI (acute kidney injury) ([place])   Depression with anxiety   Chronic diastolic (congestive) heart failure ([facility])   Hyperglycemia   GERD (gastroesophageal reflux disease)   Hypokalemia   Hypocalcemia   Abdominal pain: associated w/ intractable n/v/d. No clear etiology at this time. There is concern for diverticulitis vs cdiff. vs other bacterial enteritis.  Less likely viral etiology though a possibility.  - Tele given metabolic derangements. Tachypnea WBC 15.2 with left shift, lipase nml, UA nml. Febrile. - STAT CT abd/pelv - lactic acid, lactoferrin, stool pathogen panel, Cdiff PCR, BCX - Enteric precautions - IVF - Flagyl (will broaden as appropriate after CT results)   Multiple metabolic derangements: K 2.8, Mag 1.4, Ca 7.8. at time of presentation. Oral potassium given without any improvement.  - K replacement - Mag 2gm - iCa (Ca gluc if worsening but will have to watch K closely)  AKI: Cr. 1.25 initially but improved to 1.07 after 2L NS bolus. Baseline 0.8. - IVF - BMET in am  Depression/Anxiety: - continue xanax, paxil  Asthma: - cont albuterol PRN and QVAR  Chronic diastolic congestive heart failure: Last echo from 2014 showing EF 65% and grade 1 diastolic dysfunction. No evidence of acute  decompensation. Significant intravascular fluid shifts anticipated though given patient's diffuse GI loss with IV replacement. We'll monitor status closely - Strict I's and O's, daily weights - resume lasix in am  Hyperglycemia: 120 while fasting. Likely from acute illness - CBG monitoring - A1c  GERD: - hold PPI during acute GI infection  HTN: - continue Hyzaar in am   DVT prophylaxis: Lovenox  Code Status: full  Family Communication: none  Disposition Plan: pending improvement and workup  Consults called: none  Admission status: tele inpt    Tomeshia Pizzi J MD [facility]  If 7PM-7AM, please contact night-coverage [email or web address] Password [facility]  03/25/2016, 5:36 PM

## 2016-03-26 LAB — COMPREHENSIVE METABOLIC PANEL
ALT: 17 U/L (ref 14–54)
AST: 27 U/L (ref 15–41)
Albumin: 3.1 g/dL — ABNORMAL LOW (ref 3.5–5.0)
Alkaline Phosphatase: 84 U/L (ref 38–126)
Anion gap: 3 — ABNORMAL LOW (ref 5–15)
BUN: 13 mg/dL (ref 6–20)
CO2: 22 mmol/L (ref 22–32)
Calcium: 8 mg/dL — ABNORMAL LOW (ref 8.9–10.3)
Chloride: 113 mmol/L — ABNORMAL HIGH (ref 101–111)
Creatinine, Ser: 0.73 mg/dL (ref 0.44–1.00)
GFR calc Af Amer: >60 mL/min (ref >60)
GFR calc non Af Amer: >60 mL/min (ref >60)
Glucose, Bld: 92 mg/dL (ref 65–99)
Potassium: 3.8 mmol/L (ref 3.5–5.1)
Sodium: 138 mmol/L (ref 135–145)
Total Bilirubin: 0.5 mg/dL (ref 0.3–1.2)
Total Protein: 7.3 g/dL (ref 6.5–8.1)

## 2016-03-26 LAB — CBC
HCT: 32.6 % — ABNORMAL LOW (ref 36.0–46.0)
Hemoglobin: 10.8 g/dL — ABNORMAL LOW (ref 12.0–15.0)
MCH: 29.3 pg (ref 26.0–34.0)
MCHC: 33.1 g/dL (ref 30.0–36.0)
MCV: 88.3 fL (ref 78.0–100.0)
Platelets: 172 10*3/uL (ref 150–400)
RBC: 3.69 MIL/uL — ABNORMAL LOW (ref 3.87–5.11)
RDW: 12.7 % (ref 11.5–15.5)
WBC: 6.3 10*3/uL (ref 4.0–10.5)

## 2016-03-26 LAB — GLUCOSE, CAPILLARY
Glucose-Capillary: 95 mg/dL (ref 65–99)
Glucose-Capillary: 109 mg/dL — ABNORMAL HIGH (ref 65–99)
Glucose-Capillary: 105 mg/dL — ABNORMAL HIGH (ref 65–99)
Glucose-Capillary: 87 mg/dL (ref 65–99)

## 2016-03-26 LAB — CALCIUM, IONIZED: Calcium, Ionized, Serum: 4.5 mg/dL (ref 4.5–5.6)

## 2016-03-26 LAB — HEMOGLOBIN A1C
Hgb A1c MFr Bld: 5.3 % (ref 4.8–5.6)
Mean Plasma Glucose: 105 mg/dL

## 2016-03-26 MED ORDER — BUDESONIDE 0.25 MG/2ML IN SUSP
0.2500 mg | Freq: Two times a day (BID) | RESPIRATORY_TRACT | Status: DC
  Administered 2016-03-27 – 2016-03-28 (×3): 0.25 mg via RESPIRATORY_TRACT
  Filled 2016-03-26 (×3): qty 2

## 2016-03-26 NOTE — Progress Notes (Signed)
Patient placed on Enteric precaution,per Infection control policy,patient,and family educated, both patient,and family verbalized understanding. Educational carenotes provided. Will continue to monitor patient.

## 2016-03-26 NOTE — Progress Notes (Signed)
Subjective: She was admitted yesterday with what sounds like food poisoning. She's feeling better. She has had fever. CT did not show any definitive cause of her problem but she did have cirrhosis which is a previous diagnosis.  Objective: Vital signs in last 24 hours: Temp:  [98.5 F (36.9 C)-101.2 F (38.4 C)] 98.7 F (37.1 C) (08/05 0639) Pulse Rate:  [55-68] 60 (08/05 0639) Resp:  [20-25] 20 (08/05 0639) BP: (112-151)/(45-81) 138/79 (08/05 0639) SpO2:  [93 %-97 %] 97 % (08/05 0639) Weight:  [106 kg (233 lb 9.6 oz)] 106 kg (233 lb 9.6 oz) (08/04 1828) Weight change:     Intake/Output from previous day: No intake/output data recorded.  PHYSICAL EXAM General appearance: alert, cooperative, mild distress and morbidly obese Resp: clear to auscultation bilaterally Cardio: regular rate and rhythm, S1, S2 normal, no murmur, click, rub or gallop GI: She is mildly tender in the epigastric region sounds present and active Extremities: extremities normal, atraumatic, no cyanosis or edema  Lab Results:  Results for orders placed or performed during the hospital encounter of 03/25/16 (from the past 48 hour(s))  CBC with Differential     Status: Abnormal   Collection Time: 03/25/16 11:11 AM  Result Value Ref Range   WBC 15.2 (H) 4.0 - 10.5 K/uL   RBC 4.31 3.87 - 5.11 MIL/uL   Hemoglobin 12.7 12.0 - 15.0 g/dL   HCT 38.4 36.0 - 46.0 %   MCV 89.1 78.0 - 100.0 fL   MCH 29.5 26.0 - 34.0 pg   MCHC 33.1 30.0 - 36.0 g/dL   RDW 12.6 11.5 - 15.5 %   Platelets 209 150 - 400 K/uL   Neutrophils Relative % 82 %   Neutro Abs 12.4 (H) 1.7 - 7.7 K/uL   Lymphocytes Relative 14 %   Lymphs Abs 2.2 0.7 - 4.0 K/uL   Monocytes Relative 4 %   Monocytes Absolute 0.6 0.1 - 1.0 K/uL   Eosinophils Relative 0 %   Eosinophils Absolute 0.0 0.0 - 0.7 K/uL   Basophils Relative 0 %   Basophils Absolute 0.0 0.0 - 0.1 K/uL  Comprehensive metabolic panel     Status: Abnormal   Collection Time: 03/25/16 11:11 AM   Result Value Ref Range   Sodium 136 135 - 145 mmol/Odom   Potassium 2.9 (Odom) 3.5 - 5.1 mmol/Odom   Chloride 106 101 - 111 mmol/Odom   CO2 21 (Odom) 22 - 32 mmol/Odom   Glucose, Bld 121 (H) 65 - 99 mg/dL   BUN 25 (H) 6 - 20 mg/dL   Creatinine, Ser 1.25 (H) 0.44 - 1.00 mg/dL   Calcium 8.5 (Odom) 8.9 - 10.3 mg/dL   Total Protein 9.4 (H) 6.5 - 8.1 g/dL   Albumin 4.1 3.5 - 5.0 g/dL   AST 30 15 - 41 U/Odom   ALT 16 14 - 54 U/Odom   Alkaline Phosphatase 110 38 - 126 U/Odom   Total Bilirubin 0.5 0.3 - 1.2 mg/dL   GFR calc non Af Amer 43 (Odom) >60 mL/min   GFR calc Af Amer 50 (Odom) >60 mL/min    Comment: (NOTE) The eGFR has been calculated using the CKD EPI equation. This calculation has not been validated in all clinical situations. eGFR's persistently <60 mL/min signify possible Chronic Kidney Disease.    Anion gap 9 5 - 15  Lipase, blood     Status: None   Collection Time: 03/25/16 11:11 AM  Result Value Ref Range   Lipase 17 11 -  51 U/Odom  Troponin I     Status: None   Collection Time: 03/25/16 11:11 AM  Result Value Ref Range   Troponin I <0.03 <0.03 ng/mL  Hemoglobin A1c     Status: None   Collection Time: 03/25/16 11:11 AM  Result Value Ref Range   Hgb A1c MFr Bld 5.3 4.8 - 5.6 %    Comment: (NOTE)         Pre-diabetes: 5.7 - 6.4         Diabetes: >6.4         Glycemic control for adults with diabetes: <7.0    Mean Plasma Glucose 105 mg/dL    Comment: (NOTE) Performed At: [facility] [address] [place], [place] [id number] [place] William F MD Ph:[phone]   Urinalysis, Routine w reflex microscopic (not at [facility])     Status: None   Collection Time: 03/25/16  1:53 PM  Result Value Ref Range   Color, Urine YELLOW YELLOW   APPearance CLEAR CLEAR   Specific Gravity, Urine 1.015 1.005 - 1.030   pH 6.0 5.0 - 8.0   Glucose, UA NEGATIVE NEGATIVE mg/dL   Hgb urine dipstick NEGATIVE NEGATIVE   Bilirubin Urine NEGATIVE NEGATIVE   Ketones, ur NEGATIVE NEGATIVE mg/dL   Protein, ur NEGATIVE  NEGATIVE mg/dL   Nitrite NEGATIVE NEGATIVE   Leukocytes, UA NEGATIVE NEGATIVE    Comment: MICROSCOPIC NOT DONE ON URINES WITH NEGATIVE PROTEIN, BLOOD, LEUKOCYTES, NITRITE, OR GLUCOSE <1000 mg/dL.  Basic metabolic panel     Status: Abnormal   Collection Time: 03/25/16  2:30 PM  Result Value Ref Range   Sodium 136 135 - 145 mmol/Odom   Potassium 2.7 (LL) 3.5 - 5.1 mmol/Odom    Comment: CRITICAL RESULT CALLED TO, READ BACK BY AND VERIFIED WITH: KNOWLES,R ON 03/25/16 AT 1500 BY LOY,C    Chloride 105 101 - 111 mmol/Odom   CO2 22 22 - 32 mmol/Odom   Glucose, Bld 118 (H) 65 - 99 mg/dL   BUN 24 (H) 6 - 20 mg/dL   Creatinine, Ser 1.07 (H) 0.44 - 1.00 mg/dL   Calcium 7.8 (Odom) 8.9 - 10.3 mg/dL   GFR calc non Af Amer 52 (Odom) >60 mL/min   GFR calc Af Amer >60 >60 mL/min    Comment: (NOTE) The eGFR has been calculated using the CKD EPI equation. This calculation has not been validated in all clinical situations. eGFR's persistently <60 mL/min signify possible Chronic Kidney Disease.    Anion gap 9 5 - 15  Magnesium     Status: Abnormal   Collection Time: 03/25/16  2:30 PM  Result Value Ref Range   Magnesium 1.4 (Odom) 1.7 - 2.4 mg/dL  Phosphorus     Status: None   Collection Time: 03/25/16  5:48 PM  Result Value Ref Range   Phosphorus 2.9 2.5 - 4.6 mg/dL  Lactic acid, plasma     Status: None   Collection Time: 03/25/16  5:48 PM  Result Value Ref Range   Lactic Acid, Venous 1.0 0.5 - 1.9 mmol/Odom  Culture, blood (routine x 2)     Status: None (Preliminary result)   Collection Time: 03/25/16  5:48 PM  Result Value Ref Range   Specimen Description RIGHT ANTECUBITAL    Special Requests BOTTLES DRAWN AEROBIC AND ANAEROBIC 6CC    Culture NO GROWTH < 12 HOURS    Report Status PENDING   Culture, blood (routine x 2)     Status: None (Preliminary result)     Collection Time: 03/25/16  6:39 PM  Result Value Ref Range   Specimen Description BLOOD    Special Requests NONE    Culture NO GROWTH < 12 HOURS    Report  Status PENDING   Glucose, capillary     Status: Abnormal   Collection Time: 03/25/16 10:25 PM  Result Value Ref Range   Glucose-Capillary 127 (H) 65 - 99 mg/dL  CBC     Status: Abnormal   Collection Time: 03/26/16  6:06 AM  Result Value Ref Range   WBC 6.3 4.0 - 10.5 K/uL   RBC 3.69 (Odom) 3.87 - 5.11 MIL/uL   Hemoglobin 10.8 (Odom) 12.0 - 15.0 g/dL   HCT 32.6 (Odom) 36.0 - 46.0 %   MCV 88.3 78.0 - 100.0 fL   MCH 29.3 26.0 - 34.0 pg   MCHC 33.1 30.0 - 36.0 g/dL   RDW 12.7 11.5 - 15.5 %   Platelets 172 150 - 400 K/uL  Comprehensive metabolic panel     Status: Abnormal   Collection Time: 03/26/16  6:06 AM  Result Value Ref Range   Sodium 138 135 - 145 mmol/Odom   Potassium 3.8 3.5 - 5.1 mmol/Odom    Comment: DELTA CHECK NOTED   Chloride 113 (H) 101 - 111 mmol/Odom   CO2 22 22 - 32 mmol/Odom   Glucose, Bld 92 65 - 99 mg/dL   BUN 13 6 - 20 mg/dL   Creatinine, Ser 0.73 0.44 - 1.00 mg/dL   Calcium 8.0 (Odom) 8.9 - 10.3 mg/dL   Total Protein 7.3 6.5 - 8.1 g/dL   Albumin 3.1 (Odom) 3.5 - 5.0 g/dL   AST 27 15 - 41 U/Odom   ALT 17 14 - 54 U/Odom   Alkaline Phosphatase 84 38 - 126 U/Odom   Total Bilirubin 0.5 0.3 - 1.2 mg/dL   GFR calc non Af Amer >60 >60 mL/min   GFR calc Af Amer >60 >60 mL/min    Comment: (NOTE) The eGFR has been calculated using the CKD EPI equation. This calculation has not been validated in all clinical situations. eGFR's persistently <60 mL/min signify possible Chronic Kidney Disease.    Anion gap 3 (Odom) 5 - 15  Glucose, capillary     Status: None   Collection Time: 03/26/16  6:37 AM  Result Value Ref Range   Glucose-Capillary 95 65 - 99 mg/dL    ABGS No results for input(s): PHART, PO2ART, TCO2, HCO3 in the last 72 hours.  Invalid input(s): PCO2 CULTURES Recent Results (from the past 240 hour(s))  Culture, blood (routine x 2)     Status: None (Preliminary result)   Collection Time: 03/25/16  5:48 PM  Result Value Ref Range Status   Specimen Description RIGHT ANTECUBITAL  Final    Special Requests BOTTLES DRAWN AEROBIC AND ANAEROBIC 6CC  Final   Culture NO GROWTH < 12 HOURS  Final   Report Status PENDING  Incomplete  Culture, blood (routine x 2)     Status: None (Preliminary result)   Collection Time: 03/25/16  6:39 PM  Result Value Ref Range Status   Specimen Description BLOOD  Final   Special Requests NONE  Final   Culture NO GROWTH < 12 HOURS  Final   Report Status PENDING  Incomplete   Studies/Results: Dg Chest 2 View  Result Date: 03/25/2016 CLINICAL DATA:  Cough and congestion.  Nausea and vomiting. EXAM: CHEST  2 VIEW COMPARISON:  11/05/2015 FINDINGS: Heart size is normal. There is no pleural   effusion or edema. Aortic atherosclerosis is noted. No airspace consolidation. IMPRESSION: 1. No acute findings. 2. Aortic atherosclerosis. Electronically Signed   By: Taylor  Stroud M.D.   On: 03/25/2016 12:14   Ct Abdomen Pelvis W Contrast  Result Date: 03/25/2016 CLINICAL DATA:  Nausea, vomiting and diarrhea. History of hepatitis-C. Prior appendectomy. EXAM: CT ABDOMEN AND PELVIS WITH CONTRAST TECHNIQUE: Multidetector CT imaging of the abdomen and pelvis was performed using the standard protocol following bolus administration of intravenous contrast. CONTRAST:  100mL ISOVUE-300 IOPAMIDOL (ISOVUE-300) INJECTION 61% COMPARISON:  Chest CT 09/02/2015; CT abdomen pelvis 04/21/2009 FINDINGS: Lower chest: Normal heart size. No pericardial effusion. Dependent atelectasis within the bilateral lower lobes. No pleural effusion. Hepatobiliary: Cirrhotic morphology of the liver. Patient status post cholecystectomy. No intrahepatic or extrahepatic biliary ductal dilatation. Pancreas: Unremarkable Spleen: Unremarkable Adrenals/Urinary Tract: Normal adrenal glands. Grossly unchanged 2 cm cyst within the interpolar region the right kidney. No hydronephrosis. Urinary bladder is unremarkable. Stomach/Bowel: No abnormal bowel wall thickening or evidence for bowel obstruction. Normal morphology to  the stomach. Small hiatal hernia. Vascular/Lymphatic: Normal caliber abdominal aorta. Circumaortic left renal vein. No retroperitoneal lymphadenopathy. Other: Status post hysterectomy. Small amount of free fluid in the pelvis. Surgical clip in the right hemipelvis. Small bilateral fat containing inguinal hernias. Musculoskeletal: Lumbar spine degenerative changes. No aggressive or acute appearing osseous lesions. IMPRESSION: No acute process within the abdomen or pelvis. Small amount of free fluid in the pelvis. Cirrhotic morphology to the liver. Electronically Signed   By: Drew  Davis M.D.   On: 03/25/2016 20:32    Medications:  Prior to Admission:  Prescriptions Prior to Admission  Medication Sig Dispense Refill Last Dose  . albuterol (ACCUNEB) 0.63 MG/3ML nebulizer solution Take 3 mLs by nebulization every 6 (six) hours as needed for wheezing.   unknown  . albuterol (PROVENTIL HFA;VENTOLIN HFA) 108 (90 BASE) MCG/ACT inhaler Inhale 1-2 puffs into the lungs every 6 (six) hours as needed for wheezing. 1 Inhaler 0 unknown  . ALPRAZolam (XANAX) 0.5 MG tablet Take 1 tablet by mouth 2 (two) times daily as needed for anxiety.    unknown at Unknown time  . aspirin EC 81 MG tablet Take 81 mg by mouth daily.     03/24/2016 at Unknown time  . beclomethasone (QVAR) 40 MCG/ACT inhaler Inhale 1 puff into the lungs 2 (two) times daily as needed (shortness of breath).    unknown  . clotrimazole-betamethasone (LOTRISONE) cream Apply 1 application topically daily as needed. rash   unknown  . ergocalciferol (VITAMIN D2) 50000 UNITS capsule Take 50,000 Units by mouth once a week. sunday   Past Week at Unknown time  . fluticasone (FLONASE) 50 MCG/ACT nasal spray Place 2 sprays into the nose daily as needed for allergies.    03/24/2016 at Unknown time  . furosemide (LASIX) 40 MG tablet Take 40 mg by mouth daily.     03/24/2016 at Unknown time  . HYDROcodone-acetaminophen (NORCO/VICODIN) 5-325 MG tablet Take 1 tablet by mouth  2 (two) times daily as needed for moderate pain.    unknown  . ibuprofen (ADVIL,MOTRIN) 800 MG tablet Take 800 mg by mouth every 8 (eight) hours as needed for mild pain.    03/24/2016 at Unknown time  . latanoprost (XALATAN) 0.005 % ophthalmic solution Place 1 drop into the right eye at bedtime.   03/24/2016 at Unknown time  . loratadine (CLARITIN) 10 MG tablet Take 10 mg by mouth daily.    03/24/2016 at Unknown time  .   losartan-hydrochlorothiazide (HYZAAR) 100-25 MG per tablet Take 1 tablet by mouth daily.   03/24/2016 at Unknown time  . Multiple Vitamin (MULTIVITAMIN WITH MINERALS) TABS tablet Take 1 tablet by mouth daily.   unknown at Unknown time  . Omega-3 Fatty Acids (OMEGA-3 FISH OIL) 1200 MG CAPS Take 1,200 mg by mouth every morning.   03/24/2016 at Unknown time  . omeprazole (PRILOSEC) 40 MG capsule Take 40 mg by mouth 2 (two) times daily.    03/24/2016 at Unknown time  . PARoxetine (PAXIL) 20 MG tablet Take 20 mg by mouth 2 (two) times daily.   03/24/2016 at Unknown time  . Phenyleph-CPM-DM-Aspirin (ALKA-SELTZER PLUS COLD & COUGH PO) Take 2 capsules by mouth every 6 (six) hours as needed (cough/cold).   unknown  . potassium chloride (K-DUR) 10 MEQ tablet Take 10 mEq by mouth daily.   03/24/2016 at Unknown time  . Pseudoeph-Doxylamine-DM-APAP (DAYQUIL/NYQUIL COLD/FLU RELIEF PO) Take 2 capsules by mouth every 6 (six) hours as needed (cough/congestion).   unknown   Scheduled: . sodium chloride  1,000 mL Intravenous Once  . aspirin EC  81 mg Oral Daily  . enoxaparin (LOVENOX) injection  40 mg Subcutaneous Q24H  . furosemide  40 mg Oral Daily  . losartan  100 mg Oral Daily   And  . hydrochlorothiazide  25 mg Oral Daily  . magnesium sulfate 1 - 4 g bolus IVPB  2 g Intravenous Once  . metronidazole  500 mg Intravenous Q8H  . omega-3 acid ethyl esters  1,000 mg Oral q morning - 10a  . PARoxetine  20 mg Oral BID  . sodium chloride flush  3 mL Intravenous Q12H   Continuous: . sodium chloride Stopped  (03/25/16 1732)  . sodium chloride    . 0.9 % NaCl with KCl 40 mEq / Odom 75 mL/hr (03/25/16 2235)   IHW:[id number] **OR** acetaminophen, albuterol, ALPRAZolam, budesonide (PULMICORT) nebulizer solution, metoCLOPramide (REGLAN) injection, ondansetron **OR** ondansetron (ZOFRAN) IV, oxyCODONE  Assesment: She was admitted with abdominal pain nausea vomiting and diarrhea and I think it's probably related to food poisoning. She had multiple electrolyte imbalances which were better. She has cirrhosis of liver presumably from hepatitis C. She is generally improved but I don't think she is ready for discharge. Active Problems:   Essential hypertension   Hepatic cirrhosis (HCC)   Intractable abdominal pain   Nausea vomiting and diarrhea   AKI (acute kidney injury) ([place])   Depression with anxiety   Chronic diastolic (congestive) heart failure (HCC)   Hyperglycemia   GERD (gastroesophageal reflux disease)   Hypokalemia   Hypocalcemia    Plan: Continue current treatments. HIV testing at her request. Possible discharge tomorrow    LOS: 1 day   Megan Odom 03/26/2016, 10:36 AM

## 2016-03-27 LAB — GLUCOSE, CAPILLARY
Glucose-Capillary: 118 mg/dL — ABNORMAL HIGH (ref 65–99)
Glucose-Capillary: 122 mg/dL — ABNORMAL HIGH (ref 65–99)

## 2016-03-27 LAB — HIV ANTIBODY (ROUTINE TESTING W REFLEX): HIV Screen 4th Generation wRfx: NONREACTIVE

## 2016-03-27 MED ORDER — POTASSIUM CHLORIDE CRYS ER 20 MEQ PO TBCR
20.0000 meq | EXTENDED_RELEASE_TABLET | Freq: Every day | ORAL | Status: DC
  Administered 2016-03-27: 20 meq via ORAL
  Filled 2016-03-27: qty 1

## 2016-03-27 NOTE — Care Management Important Message (Signed)
Important Message  Patient Details  Name: Megan Odom MRN: [id number] Date of Birth: Jan 04, 1946   Medicare Important Message Given:  Yes    Briant Sites, RN 03/27/2016, 1:18 PM

## 2016-03-27 NOTE — Progress Notes (Signed)
Subjective: She says she feels better. Not having any diarrhea but she's not had any solid food.  Objective: Vital signs in last 24 hours: Temp:  [98.6 F (37 C)-99.3 F (37.4 C)] 99.3 F (37.4 C) (08/06 0511) Pulse Rate:  [56-60] 56 (08/06 0511) Resp:  [18-20] 20 (08/06 0511) BP: (159-165)/(60-76) 159/60 (08/06 0511) SpO2:  [96 %-100 %] 96 % (08/06 0747) Weight:  [106.1 kg (233 lb 14.4 oz)] 106.1 kg (233 lb 14.4 oz) (08/06 0511) Weight change: 1.315 kg (2 lb 14.4 oz)    Intake/Output from previous day: 08/05 0701 - 08/06 0700 In: 1741.3 [P.O.:240; I.V.:1501.3] Out: 300 [Urine:300]  PHYSICAL EXAM General appearance: alert, cooperative and no distress Resp: clear to auscultation bilaterally Cardio: regular rate and rhythm, S1, S2 normal, no murmur, click, rub or gallop GI: soft, non-tender; bowel sounds normal; no masses,  no organomegaly Extremities: extremities normal, atraumatic, no cyanosis or edema  Lab Results:  Results for orders placed or performed during the hospital encounter of 03/25/16 (from the past 48 hour(s))  CBC with Differential     Status: Abnormal   Collection Time: 03/25/16 11:11 AM  Result Value Ref Range   WBC 15.2 (H) 4.0 - 10.5 K/uL   RBC 4.31 3.87 - 5.11 MIL/uL   Hemoglobin 12.7 12.0 - 15.0 g/dL   HCT 38.4 36.0 - 46.0 %   MCV 89.1 78.0 - 100.0 fL   MCH 29.5 26.0 - 34.0 pg   MCHC 33.1 30.0 - 36.0 g/dL   RDW 12.6 11.5 - 15.5 %   Platelets 209 150 - 400 K/uL   Neutrophils Relative % 82 %   Neutro Abs 12.4 (H) 1.7 - 7.7 K/uL   Lymphocytes Relative 14 %   Lymphs Abs 2.2 0.7 - 4.0 K/uL   Monocytes Relative 4 %   Monocytes Absolute 0.6 0.1 - 1.0 K/uL   Eosinophils Relative 0 %   Eosinophils Absolute 0.0 0.0 - 0.7 K/uL   Basophils Relative 0 %   Basophils Absolute 0.0 0.0 - 0.1 K/uL  Comprehensive metabolic panel     Status: Abnormal   Collection Time: 03/25/16 11:11 AM  Result Value Ref Range   Sodium 136 135 - 145 mmol/L   Potassium 2.9 (L)  3.5 - 5.1 mmol/L   Chloride 106 101 - 111 mmol/L   CO2 21 (L) 22 - 32 mmol/L   Glucose, Bld 121 (H) 65 - 99 mg/dL   BUN 25 (H) 6 - 20 mg/dL   Creatinine, Ser 1.25 (H) 0.44 - 1.00 mg/dL   Calcium 8.5 (L) 8.9 - 10.3 mg/dL   Total Protein 9.4 (H) 6.5 - 8.1 g/dL   Albumin 4.1 3.5 - 5.0 g/dL   AST 30 15 - 41 U/L   ALT 16 14 - 54 U/L   Alkaline Phosphatase 110 38 - 126 U/L   Total Bilirubin 0.5 0.3 - 1.2 mg/dL   GFR calc non Af Amer 43 (L) >60 mL/min   GFR calc Af Amer 50 (L) >60 mL/min    Comment: (NOTE) The eGFR has been calculated using the CKD EPI equation. This calculation has not been validated in all clinical situations. eGFR's persistently <60 mL/min signify possible Chronic Kidney Disease.    Anion gap 9 5 - 15  Lipase, blood     Status: None   Collection Time: 03/25/16 11:11 AM  Result Value Ref Range   Lipase 17 11 - 51 U/L  Troponin I     Status: None  Collection Time: 03/25/16 11:11 AM  Result Value Ref Range   Troponin I <0.03 <0.03 ng/mL  Hemoglobin A1c     Status: None   Collection Time: 03/25/16 11:11 AM  Result Value Ref Range   Hgb A1c MFr Bld 5.3 4.8 - 5.6 %    Comment: (NOTE)         Pre-diabetes: 5.7 - 6.4         Diabetes: >6.4         Glycemic control for adults with diabetes: <7.0    Mean Plasma Glucose 105 mg/dL    Comment: (NOTE) Performed At: [facility] [place], [place] [id number] Lindon Romp MD [id number]   Urinalysis, Routine w reflex microscopic (not at [facility])     Status: None   Collection Time: 03/25/16  1:53 PM  Result Value Ref Range   Color, Urine YELLOW YELLOW   APPearance CLEAR CLEAR   Specific Gravity, Urine 1.015 1.005 - 1.030   pH 6.0 5.0 - 8.0   Glucose, UA NEGATIVE NEGATIVE mg/dL   Hgb urine dipstick NEGATIVE NEGATIVE   Bilirubin Urine NEGATIVE NEGATIVE   Ketones, ur NEGATIVE NEGATIVE mg/dL   Protein, ur NEGATIVE NEGATIVE mg/dL   Nitrite NEGATIVE NEGATIVE   Leukocytes, UA NEGATIVE NEGATIVE     Comment: MICROSCOPIC NOT DONE ON URINES WITH NEGATIVE PROTEIN, BLOOD, LEUKOCYTES, NITRITE, OR GLUCOSE <1000 mg/dL.  Basic metabolic panel     Status: Abnormal   Collection Time: 03/25/16  2:30 PM  Result Value Ref Range   Sodium 136 135 - 145 mmol/L   Potassium 2.7 (LL) 3.5 - 5.1 mmol/L    Comment: CRITICAL RESULT CALLED TO, READ BACK BY AND VERIFIED WITH: KNOWLES,R ON 03/25/16 AT 1500 BY LOY,C    Chloride 105 101 - 111 mmol/L   CO2 22 22 - 32 mmol/L   Glucose, Bld 118 (H) 65 - 99 mg/dL   BUN 24 (H) 6 - 20 mg/dL   Creatinine, Ser 1.07 (H) 0.44 - 1.00 mg/dL   Calcium 7.8 (L) 8.9 - 10.3 mg/dL   GFR calc non Af Amer 52 (L) >60 mL/min   GFR calc Af Amer >60 >60 mL/min    Comment: (NOTE) The eGFR has been calculated using the CKD EPI equation. This calculation has not been validated in all clinical situations. eGFR's persistently <60 mL/min signify possible Chronic Kidney Disease.    Anion gap 9 5 - 15  Magnesium     Status: Abnormal   Collection Time: 03/25/16  2:30 PM  Result Value Ref Range   Magnesium 1.4 (L) 1.7 - 2.4 mg/dL  Phosphorus     Status: None   Collection Time: 03/25/16  5:48 PM  Result Value Ref Range   Phosphorus 2.9 2.5 - 4.6 mg/dL  Lactic acid, plasma     Status: None   Collection Time: 03/25/16  5:48 PM  Result Value Ref Range   Lactic Acid, Venous 1.0 0.5 - 1.9 mmol/L  Culture, blood (routine x 2)     Status: None (Preliminary result)   Collection Time: 03/25/16  5:48 PM  Result Value Ref Range   Specimen Description RIGHT ANTECUBITAL    Special Requests BOTTLES DRAWN AEROBIC AND ANAEROBIC 6CC    Culture NO GROWTH 2 DAYS    Report Status PENDING   Calcium, ionized     Status: None   Collection Time: 03/25/16  5:48 PM  Result Value Ref Range   Calcium, Ionized, Serum 4.5 4.5 -  5.6 mg/dL    Comment: (NOTE) Performed At: [facility] [place], [place] [id number] Lindon Romp MD [id number]   Culture, blood (routine x 2)      Status: None (Preliminary result)   Collection Time: 03/25/16  6:39 PM  Result Value Ref Range   Specimen Description BLOOD    Special Requests NONE    Culture NO GROWTH 2 DAYS    Report Status PENDING   Glucose, capillary     Status: Abnormal   Collection Time: 03/25/16 10:25 PM  Result Value Ref Range   Glucose-Capillary 127 (H) 65 - 99 mg/dL  CBC     Status: Abnormal   Collection Time: 03/26/16  6:06 AM  Result Value Ref Range   WBC 6.3 4.0 - 10.5 K/uL   RBC 3.69 (L) 3.87 - 5.11 MIL/uL   Hemoglobin 10.8 (L) 12.0 - 15.0 g/dL   HCT 32.6 (L) 36.0 - 46.0 %   MCV 88.3 78.0 - 100.0 fL   MCH 29.3 26.0 - 34.0 pg   MCHC 33.1 30.0 - 36.0 g/dL   RDW 12.7 11.5 - 15.5 %   Platelets 172 150 - 400 K/uL  Comprehensive metabolic panel     Status: Abnormal   Collection Time: 03/26/16  6:06 AM  Result Value Ref Range   Sodium 138 135 - 145 mmol/L   Potassium 3.8 3.5 - 5.1 mmol/L    Comment: DELTA CHECK NOTED   Chloride 113 (H) 101 - 111 mmol/L   CO2 22 22 - 32 mmol/L   Glucose, Bld 92 65 - 99 mg/dL   BUN 13 6 - 20 mg/dL   Creatinine, Ser 0.73 0.44 - 1.00 mg/dL   Calcium 8.0 (L) 8.9 - 10.3 mg/dL   Total Protein 7.3 6.5 - 8.1 g/dL   Albumin 3.1 (L) 3.5 - 5.0 g/dL   AST 27 15 - 41 U/L   ALT 17 14 - 54 U/L   Alkaline Phosphatase 84 38 - 126 U/L   Total Bilirubin 0.5 0.3 - 1.2 mg/dL   GFR calc non Af Amer >60 >60 mL/min   GFR calc Af Amer >60 >60 mL/min    Comment: (NOTE) The eGFR has been calculated using the CKD EPI equation. This calculation has not been validated in all clinical situations. eGFR's persistently <60 mL/min signify possible Chronic Kidney Disease.    Anion gap 3 (L) 5 - 15  Glucose, capillary     Status: None   Collection Time: 03/26/16  6:37 AM  Result Value Ref Range   Glucose-Capillary 95 65 - 99 mg/dL  Glucose, capillary     Status: Abnormal   Collection Time: 03/26/16  2:50 PM  Result Value Ref Range   Glucose-Capillary 109 (H) 65 - 99 mg/dL  Glucose,  capillary     Status: Abnormal   Collection Time: 03/26/16  4:28 PM  Result Value Ref Range   Glucose-Capillary 105 (H) 65 - 99 mg/dL  Glucose, capillary     Status: None   Collection Time: 03/26/16  9:30 PM  Result Value Ref Range   Glucose-Capillary 87 65 - 99 mg/dL    ABGS No results for input(s): PHART, PO2ART, TCO2, HCO3 in the last 72 hours.  Invalid input(s): PCO2 CULTURES Recent Results (from the past 240 hour(s))  Culture, blood (routine x 2)     Status: None (Preliminary result)   Collection Time: 03/25/16  5:48 PM  Result Value Ref Range Status   Specimen  Description RIGHT ANTECUBITAL  Final   Special Requests BOTTLES DRAWN AEROBIC AND ANAEROBIC 6CC  Final   Culture NO GROWTH 2 DAYS  Final   Report Status PENDING  Incomplete  Culture, blood (routine x 2)     Status: None (Preliminary result)   Collection Time: 03/25/16  6:39 PM  Result Value Ref Range Status   Specimen Description BLOOD  Final   Special Requests NONE  Final   Culture NO GROWTH 2 DAYS  Final   Report Status PENDING  Incomplete   Studies/Results: Dg Chest 2 View  Result Date: 03/25/2016 CLINICAL DATA:  Cough and congestion.  Nausea and vomiting. EXAM: CHEST  2 VIEW COMPARISON:  11/05/2015 FINDINGS: Heart size is normal. There is no pleural effusion or edema. Aortic atherosclerosis is noted. No airspace consolidation. IMPRESSION: 1. No acute findings. 2. Aortic atherosclerosis. Electronically Signed   By: Kerby Moors M.D.   On: 03/25/2016 12:14   Ct Abdomen Pelvis W Contrast  Result Date: 03/25/2016 CLINICAL DATA:  Nausea, vomiting and diarrhea. History of hepatitis-C. Prior appendectomy. EXAM: CT ABDOMEN AND PELVIS WITH CONTRAST TECHNIQUE: Multidetector CT imaging of the abdomen and pelvis was performed using the standard protocol following bolus administration of intravenous contrast. CONTRAST:  14m ISOVUE-300 IOPAMIDOL (ISOVUE-300) INJECTION 61% COMPARISON:  Chest CT 09/02/2015; CT abdomen pelvis  04/21/2009 FINDINGS: Lower chest: Normal heart size. No pericardial effusion. Dependent atelectasis within the bilateral lower lobes. No pleural effusion. Hepatobiliary: Cirrhotic morphology of the liver. Patient status post cholecystectomy. No intrahepatic or extrahepatic biliary ductal dilatation. Pancreas: Unremarkable Spleen: Unremarkable Adrenals/Urinary Tract: Normal adrenal glands. Grossly unchanged 2 cm cyst within the interpolar region the right kidney. No hydronephrosis. Urinary bladder is unremarkable. Stomach/Bowel: No abnormal bowel wall thickening or evidence for bowel obstruction. Normal morphology to the stomach. Small hiatal hernia. Vascular/Lymphatic: Normal caliber abdominal aorta. Circumaortic left renal vein. No retroperitoneal lymphadenopathy. Other: Status post hysterectomy. Small amount of free fluid in the pelvis. Surgical clip in the right hemipelvis. Small bilateral fat containing inguinal hernias. Musculoskeletal: Lumbar spine degenerative changes. No aggressive or acute appearing osseous lesions. IMPRESSION: No acute process within the abdomen or pelvis. Small amount of free fluid in the pelvis. Cirrhotic morphology to the liver. Electronically Signed   By: DLovey NewcomerM.D.   On: 03/25/2016 20:32    Medications:  Prior to Admission:  Prescriptions Prior to Admission  Medication Sig Dispense Refill Last Dose  . albuterol (ACCUNEB) 0.63 MG/3ML nebulizer solution Take 3 mLs by nebulization every 6 (six) hours as needed for wheezing.   unknown  . albuterol (PROVENTIL HFA;VENTOLIN HFA) 108 (90 BASE) MCG/ACT inhaler Inhale 1-2 puffs into the lungs every 6 (six) hours as needed for wheezing. 1 Inhaler 0 unknown  . ALPRAZolam (XANAX) 0.5 MG tablet Take 1 tablet by mouth 2 (two) times daily as needed for anxiety.    unknown at Unknown time  . aspirin EC 81 MG tablet Take 81 mg by mouth daily.     03/24/2016 at Unknown time  . beclomethasone (QVAR) 40 MCG/ACT inhaler Inhale 1 puff into  the lungs 2 (two) times daily as needed (shortness of breath).    unknown  . clotrimazole-betamethasone (LOTRISONE) cream Apply 1 application topically daily as needed. rash   unknown  . ergocalciferol (VITAMIN D2) 50000 UNITS capsule Take 50,000 Units by mouth once a week. sunday   Past Week at Unknown time  . fluticasone (FLONASE) 50 MCG/ACT nasal spray Place 2 sprays into the nose daily as  needed for allergies.    03/24/2016 at Unknown time  . furosemide (LASIX) 40 MG tablet Take 40 mg by mouth daily.     03/24/2016 at Unknown time  . HYDROcodone-acetaminophen (NORCO/VICODIN) 5-325 MG tablet Take 1 tablet by mouth 2 (two) times daily as needed for moderate pain.    unknown  . ibuprofen (ADVIL,MOTRIN) 800 MG tablet Take 800 mg by mouth every 8 (eight) hours as needed for mild pain.    03/24/2016 at Unknown time  . latanoprost (XALATAN) 0.005 % ophthalmic solution Place 1 drop into the right eye at bedtime.   03/24/2016 at Unknown time  . loratadine (CLARITIN) 10 MG tablet Take 10 mg by mouth daily.    03/24/2016 at Unknown time  . losartan-hydrochlorothiazide (HYZAAR) 100-25 MG per tablet Take 1 tablet by mouth daily.   03/24/2016 at Unknown time  . Multiple Vitamin (MULTIVITAMIN WITH MINERALS) TABS tablet Take 1 tablet by mouth daily.   unknown at Unknown time  . Omega-3 Fatty Acids (OMEGA-3 FISH OIL) 1200 MG CAPS Take 1,200 mg by mouth every morning.   03/24/2016 at Unknown time  . omeprazole (PRILOSEC) 40 MG capsule Take 40 mg by mouth 2 (two) times daily.    03/24/2016 at Unknown time  . PARoxetine (PAXIL) 20 MG tablet Take 20 mg by mouth 2 (two) times daily.   03/24/2016 at Unknown time  . Phenyleph-CPM-DM-Aspirin (ALKA-SELTZER PLUS COLD & COUGH PO) Take 2 capsules by mouth every 6 (six) hours as needed (cough/cold).   unknown  . potassium chloride (K-DUR) 10 MEQ tablet Take 10 mEq by mouth daily.   03/24/2016 at Unknown time  . Pseudoeph-Doxylamine-DM-APAP (DAYQUIL/NYQUIL COLD/FLU RELIEF PO) Take 2 capsules by  mouth every 6 (six) hours as needed (cough/congestion).   unknown   Scheduled: . sodium chloride  1,000 mL Intravenous Once  . aspirin EC  81 mg Oral Daily  . budesonide (PULMICORT) nebulizer solution  0.25 mg Nebulization BID  . enoxaparin (LOVENOX) injection  40 mg Subcutaneous Q24H  . furosemide  40 mg Oral Daily  . losartan  100 mg Oral Daily   And  . hydrochlorothiazide  25 mg Oral Daily  . magnesium sulfate 1 - 4 g bolus IVPB  2 g Intravenous Once  . metronidazole  500 mg Intravenous Q8H  . omega-3 acid ethyl esters  1,000 mg Oral q morning - 10a  . PARoxetine  20 mg Oral BID  . potassium chloride  20 mEq Oral Daily  . sodium chloride flush  3 mL Intravenous Q12H   Continuous:  QRF:[id number] **OR** acetaminophen, albuterol, ALPRAZolam, metoCLOPramide (REGLAN) injection, ondansetron **OR** ondansetron (ZOFRAN) IV, oxyCODONE  Assesment: She has been admitted with what appears to be a viral gastroenteritis. She had abdominal pain nausea vomiting and diarrhea. She had acute kidney injury I think dehydration. She is much better. Active Problems:   Essential hypertension   Hepatic cirrhosis (HCC)   Intractable abdominal pain   Nausea vomiting and diarrhea   AKI (acute kidney injury) ([place])   Depression with anxiety   Chronic diastolic (congestive) heart failure (HCC)   Hyperglycemia   GERD (gastroesophageal reflux disease)   Hypokalemia   Hypocalcemia    Plan: Advance diet. Discontinue IV fluids. Recheck labs in the morning and probable discharge in the morning    LOS: 2 days   Seanna Sisler L 03/27/2016, 7:56 AM

## 2016-03-28 LAB — BASIC METABOLIC PANEL
Anion gap: 8 (ref 5–15)
BUN: 7 mg/dL (ref 6–20)
CO2: 23 mmol/L (ref 22–32)
Calcium: 8.2 mg/dL — ABNORMAL LOW (ref 8.9–10.3)
Chloride: 104 mmol/L (ref 101–111)
Creatinine, Ser: 0.69 mg/dL (ref 0.44–1.00)
GFR calc Af Amer: >60 mL/min (ref >60)
GFR calc non Af Amer: >60 mL/min (ref >60)
Glucose, Bld: 90 mg/dL (ref 65–99)
Potassium: 3.4 mmol/L — ABNORMAL LOW (ref 3.5–5.1)
Sodium: 135 mmol/L (ref 135–145)

## 2016-03-28 LAB — GLUCOSE, CAPILLARY: Glucose-Capillary: 93 mg/dL (ref 65–99)

## 2016-03-28 LAB — MAGNESIUM: Magnesium: 1.3 mg/dL — ABNORMAL LOW (ref 1.7–2.4)

## 2016-03-28 MED ORDER — POTASSIUM CHLORIDE CRYS ER 20 MEQ PO TBCR
40.0000 meq | EXTENDED_RELEASE_TABLET | Freq: Once | ORAL | Status: AC
  Administered 2016-03-28: 40 meq via ORAL
  Filled 2016-03-28: qty 2

## 2016-03-28 NOTE — Discharge Summary (Signed)
Physician Discharge Summary  Patient ID: Megan Odom MRN: [id number] DOB/AGE: Apr 29, 1946 70 y.o. Primary Care Physician:FANTA,TESFAYE, MD Admit date: 03/25/2016 Discharge date: 03/28/2016    Discharge Diagnoses:   Active Problems:   Essential hypertension   Hepatic cirrhosis (HCC)   Intractable abdominal pain   Nausea vomiting and diarrhea   AKI (acute kidney injury) ([place])   Depression with anxiety   Chronic diastolic (congestive) heart failure (HCC)   Hyperglycemia   GERD (gastroesophageal reflux disease)   Hypokalemia   Hypocalcemia     Medication List    STOP taking these medications   ibuprofen 800 MG tablet Commonly known as:  ADVIL,MOTRIN     TAKE these medications   albuterol 0.63 MG/3ML nebulizer solution Commonly known as:  ACCUNEB Take 3 mLs by nebulization every 6 (six) hours as needed for wheezing.   albuterol 108 (90 Base) MCG/ACT inhaler Commonly known as:  PROVENTIL HFA;VENTOLIN HFA Inhale 1-2 puffs into the lungs every 6 (six) hours as needed for wheezing.   ALKA-SELTZER PLUS COLD & COUGH PO Take 2 capsules by mouth every 6 (six) hours as needed (cough/cold).   ALPRAZolam 0.5 MG tablet Commonly known as:  XANAX Take 1 tablet by mouth 2 (two) times daily as needed for anxiety.   aspirin EC 81 MG tablet Take 81 mg by mouth daily.   beclomethasone 40 MCG/ACT inhaler Commonly known as:  QVAR Inhale 1 puff into the lungs 2 (two) times daily as needed (shortness of breath).   clotrimazole-betamethasone cream Commonly known as:  LOTRISONE Apply 1 application topically daily as needed. rash   DAYQUIL/NYQUIL COLD/FLU RELIEF PO Take 2 capsules by mouth every 6 (six) hours as needed (cough/congestion).   ergocalciferol 50000 units capsule Commonly known as:  VITAMIN D2 Take 50,000 Units by mouth once a week. sunday   fluticasone 50 MCG/ACT nasal spray Commonly known as:  FLONASE Place 2 sprays into the nose daily as needed for allergies.    furosemide 40 MG tablet Commonly known as:  LASIX Take 40 mg by mouth daily.   HYDROcodone-acetaminophen 5-325 MG tablet Commonly known as:  NORCO/VICODIN Take 1 tablet by mouth 2 (two) times daily as needed for moderate pain.   latanoprost 0.005 % ophthalmic solution Commonly known as:  XALATAN Place 1 drop into the right eye at bedtime.   loratadine 10 MG tablet Commonly known as:  CLARITIN Take 10 mg by mouth daily.   losartan-hydrochlorothiazide 100-25 MG tablet Commonly known as:  HYZAAR Take 1 tablet by mouth daily.   multivitamin with minerals Tabs tablet Take 1 tablet by mouth daily.   Omega-3 Fish Oil 1200 MG Caps Take 1,200 mg by mouth every morning.   omeprazole 40 MG capsule Commonly known as:  PRILOSEC Take 40 mg by mouth 2 (two) times daily.   PARoxetine 20 MG tablet Commonly known as:  PAXIL Take 20 mg by mouth 2 (two) times daily.   potassium chloride 10 MEQ tablet Commonly known as:  K-DUR Take 10 mEq by mouth daily.       Discharged Condition:Improved    Consults: None  Significant Diagnostic Studies: Dg Chest 2 View  Result Date: 03/25/2016 CLINICAL DATA:  Cough and congestion.  Nausea and vomiting. EXAM: CHEST  2 VIEW COMPARISON:  11/05/2015 FINDINGS: Heart size is normal. There is no pleural effusion or edema. Aortic atherosclerosis is noted. No airspace consolidation. IMPRESSION: 1. No acute findings. 2. Aortic atherosclerosis. Electronically Signed   By: Queen Slough.D.  On: 03/25/2016 12:14   Ct Abdomen Pelvis W Contrast  Result Date: 03/25/2016 CLINICAL DATA:  Nausea, vomiting and diarrhea. History of hepatitis-C. Prior appendectomy. EXAM: CT ABDOMEN AND PELVIS WITH CONTRAST TECHNIQUE: Multidetector CT imaging of the abdomen and pelvis was performed using the standard protocol following bolus administration of intravenous contrast. CONTRAST:  182mL ISOVUE-300 IOPAMIDOL (ISOVUE-300) INJECTION 61% COMPARISON:  Chest CT 09/02/2015; CT  abdomen pelvis 04/21/2009 FINDINGS: Lower chest: Normal heart size. No pericardial effusion. Dependent atelectasis within the bilateral lower lobes. No pleural effusion. Hepatobiliary: Cirrhotic morphology of the liver. Patient status post cholecystectomy. No intrahepatic or extrahepatic biliary ductal dilatation. Pancreas: Unremarkable Spleen: Unremarkable Adrenals/Urinary Tract: Normal adrenal glands. Grossly unchanged 2 cm cyst within the interpolar region the right kidney. No hydronephrosis. Urinary bladder is unremarkable. Stomach/Bowel: No abnormal bowel wall thickening or evidence for bowel obstruction. Normal morphology to the stomach. Small hiatal hernia. Vascular/Lymphatic: Normal caliber abdominal aorta. Circumaortic left renal vein. No retroperitoneal lymphadenopathy. Other: Status post hysterectomy. Small amount of free fluid in the pelvis. Surgical clip in the right hemipelvis. Small bilateral fat containing inguinal hernias. Musculoskeletal: Lumbar spine degenerative changes. No aggressive or acute appearing osseous lesions. IMPRESSION: No acute process within the abdomen or pelvis. Small amount of free fluid in the pelvis. Cirrhotic morphology to the liver. Electronically Signed   By: Lovey Newcomer M.D.   On: 03/25/2016 20:32    Lab Results: Basic Metabolic Panel:  Recent Labs  03/25/16 1430 03/25/16 1748 03/26/16 0606 03/28/16 0627  NA 136  --  138 135  K 2.7*  --  3.8 3.4*  CL 105  --  113* 104  CO2 22  --  22 23  GLUCOSE 118*  --  92 90  BUN 24*  --  13 7  CREATININE 1.07*  --  0.73 0.69  CALCIUM 7.8*  --  8.0* 8.2*  MG 1.4*  --   --  1.3*  PHOS  --  2.9  --   --    Liver Function Tests:  Recent Labs  03/25/16 1111 03/26/16 0606  AST 30 27  ALT 16 17  ALKPHOS 110 84  BILITOT 0.5 0.5  PROT 9.4* 7.3  ALBUMIN 4.1 3.1*     CBC:  Recent Labs  03/25/16 1111 03/26/16 0606  WBC 15.2* 6.3  NEUTROABS 12.4*  --   HGB 12.7 10.8*  HCT 38.4 32.6*  MCV 89.1 88.3   PLT 209 172    Recent Results (from the past 240 hour(s))  Culture, blood (routine x 2)     Status: None (Preliminary result)   Collection Time: 03/25/16  5:48 PM  Result Value Ref Range Status   Specimen Description RIGHT ANTECUBITAL  Final   Special Requests BOTTLES DRAWN AEROBIC AND ANAEROBIC 6CC  Final   Culture NO GROWTH 2 DAYS  Final   Report Status PENDING  Incomplete  Culture, blood (routine x 2)     Status: None (Preliminary result)   Collection Time: 03/25/16  6:39 PM  Result Value Ref Range Status   Specimen Description BLOOD  Final   Special Requests NONE  Final   Culture NO GROWTH 2 DAYS  Final   Report Status PENDING  Incomplete     Hospital Course: This is a 70 year old who came to the emergency department because of nausea vomiting and diarrhea. This occurred after eating pizza. She was found to be dehydrated with electrolyte abnormalities. Her electrolytes were replaced she was given IV fluids and improved.  Her diet was advanced. She was able to tolerate heart healthy diet and discharged home in much improved condition.  Discharge Exam: Blood pressure (!) 149/72, pulse (!) 47, temperature 98 F (36.7 C), temperature source Oral, resp. rate 20, height 5\' 5"  (1.651 m), weight 105.1 kg (231 lb 11.2 oz), SpO2 95 %. She is awake and alert. Her chest is clear. Her heart is regular abdomen is soft  Disposition: Home she does not want home health services  Discharge Instructions    Discharge patient    Complete by:  As directed        Signed: Ilamae Geng L   03/28/2016, 8:58 AM

## 2016-03-28 NOTE — Progress Notes (Signed)
Subjective: She says she feels better and wants to go home. No further nausea vomiting diarrhea or abdominal pain.  Objective: Vital signs in last 24 hours: Temp:  [98 F (36.7 C)-98.6 F (37 C)] 98 F (36.7 C) (08/07 0446) Pulse Rate:  [47-55] 47 (08/07 0446) Resp:  [20] 20 (08/07 0446) BP: (149-197)/(65-90) 149/72 (08/07 0446) SpO2:  [95 %-100 %] 95 % (08/07 0737) Weight:  [105.1 kg (231 lb 11.2 oz)] 105.1 kg (231 lb 11.2 oz) (08/07 0446) Weight change: -0.998 kg (-2 lb 3.2 oz)    Intake/Output from previous day: 08/06 0701 - 08/07 0700 In: 1420 [P.O.:1320; IV Piggyback:100] Out: 2000 [Urine:2000]  PHYSICAL EXAM General appearance: alert, cooperative and no distress Resp: clear to auscultation bilaterally Cardio: regular rate and rhythm, S1, S2 normal, no murmur, click, rub or gallop GI: soft, non-tender; bowel sounds normal; no masses,  no organomegaly Extremities: extremities normal, atraumatic, no cyanosis or edema  Lab Results:  Results for orders placed or performed during the hospital encounter of 03/25/16 (from the past 48 hour(s))  HIV antibody     Status: None   Collection Time: 03/26/16 10:36 AM  Result Value Ref Range   HIV Screen 4th Generation wRfx Non Reactive Non Reactive    Comment: (NOTE) Performed At: [facility] [place], [place] [id number] Lindon Romp MD QI:[id number]   Glucose, capillary     Status: Abnormal   Collection Time: 03/26/16  2:50 PM  Result Value Ref Range   Glucose-Capillary 109 (H) 65 - 99 mg/dL  Glucose, capillary     Status: Abnormal   Collection Time: 03/26/16  4:28 PM  Result Value Ref Range   Glucose-Capillary 105 (H) 65 - 99 mg/dL  Glucose, capillary     Status: None   Collection Time: 03/26/16  9:30 PM  Result Value Ref Range   Glucose-Capillary 87 65 - 99 mg/dL  Glucose, capillary     Status: Abnormal   Collection Time: 03/27/16  7:29 AM  Result Value Ref Range   Glucose-Capillary 118 (H)  65 - 99 mg/dL  Glucose, capillary     Status: Abnormal   Collection Time: 03/27/16 11:32 AM  Result Value Ref Range   Glucose-Capillary 122 (H) 65 - 99 mg/dL  Glucose, capillary     Status: None   Collection Time: 03/28/16  4:43 AM  Result Value Ref Range   Glucose-Capillary 93 65 - 99 mg/dL   Comment 1 Notify RN    Comment 2 Document in Chart   Basic metabolic panel     Status: Abnormal   Collection Time: 03/28/16  6:27 AM  Result Value Ref Range   Sodium 135 135 - 145 mmol/L   Potassium 3.4 (L) 3.5 - 5.1 mmol/L   Chloride 104 101 - 111 mmol/L   CO2 23 22 - 32 mmol/L   Glucose, Bld 90 65 - 99 mg/dL   BUN 7 6 - 20 mg/dL   Creatinine, Ser 0.69 0.44 - 1.00 mg/dL   Calcium 8.2 (L) 8.9 - 10.3 mg/dL   GFR calc non Af Amer >60 >60 mL/min   GFR calc Af Amer >60 >60 mL/min    Comment: (NOTE) The eGFR has been calculated using the CKD EPI equation. This calculation has not been validated in all clinical situations. eGFR's persistently <60 mL/min signify possible Chronic Kidney Disease.    Anion gap 8 5 - 15  Magnesium     Status: Abnormal   Collection Time:  03/28/16  6:27 AM  Result Value Ref Range   Magnesium 1.3 (L) 1.7 - 2.4 mg/dL    ABGS No results for input(s): PHART, PO2ART, TCO2, HCO3 in the last 72 hours.  Invalid input(s): PCO2 CULTURES Recent Results (from the past 240 hour(s))  Culture, blood (routine x 2)     Status: None (Preliminary result)   Collection Time: 03/25/16  5:48 PM  Result Value Ref Range Status   Specimen Description RIGHT ANTECUBITAL  Final   Special Requests BOTTLES DRAWN AEROBIC AND ANAEROBIC 6CC  Final   Culture NO GROWTH 2 DAYS  Final   Report Status PENDING  Incomplete  Culture, blood (routine x 2)     Status: None (Preliminary result)   Collection Time: 03/25/16  6:39 PM  Result Value Ref Range Status   Specimen Description BLOOD  Final   Special Requests NONE  Final   Culture NO GROWTH 2 DAYS  Final   Report Status PENDING  Incomplete    Studies/Results: No results found.  Medications:  Prior to Admission:  Prescriptions Prior to Admission  Medication Sig Dispense Refill Last Dose  . albuterol (ACCUNEB) 0.63 MG/3ML nebulizer solution Take 3 mLs by nebulization every 6 (six) hours as needed for wheezing.   unknown  . albuterol (PROVENTIL HFA;VENTOLIN HFA) 108 (90 BASE) MCG/ACT inhaler Inhale 1-2 puffs into the lungs every 6 (six) hours as needed for wheezing. 1 Inhaler 0 unknown  . ALPRAZolam (XANAX) 0.5 MG tablet Take 1 tablet by mouth 2 (two) times daily as needed for anxiety.    unknown at Unknown time  . aspirin EC 81 MG tablet Take 81 mg by mouth daily.     03/24/2016 at Unknown time  . beclomethasone (QVAR) 40 MCG/ACT inhaler Inhale 1 puff into the lungs 2 (two) times daily as needed (shortness of breath).    unknown  . clotrimazole-betamethasone (LOTRISONE) cream Apply 1 application topically daily as needed. rash   unknown  . ergocalciferol (VITAMIN D2) 50000 UNITS capsule Take 50,000 Units by mouth once a week. sunday   Past Week at Unknown time  . fluticasone (FLONASE) 50 MCG/ACT nasal spray Place 2 sprays into the nose daily as needed for allergies.    03/24/2016 at Unknown time  . furosemide (LASIX) 40 MG tablet Take 40 mg by mouth daily.     03/24/2016 at Unknown time  . HYDROcodone-acetaminophen (NORCO/VICODIN) 5-325 MG tablet Take 1 tablet by mouth 2 (two) times daily as needed for moderate pain.    unknown  . ibuprofen (ADVIL,MOTRIN) 800 MG tablet Take 800 mg by mouth every 8 (eight) hours as needed for mild pain.    03/24/2016 at Unknown time  . latanoprost (XALATAN) 0.005 % ophthalmic solution Place 1 drop into the right eye at bedtime.   03/24/2016 at Unknown time  . loratadine (CLARITIN) 10 MG tablet Take 10 mg by mouth daily.    03/24/2016 at Unknown time  . losartan-hydrochlorothiazide (HYZAAR) 100-25 MG per tablet Take 1 tablet by mouth daily.   03/24/2016 at Unknown time  . Multiple Vitamin (MULTIVITAMIN WITH  MINERALS) TABS tablet Take 1 tablet by mouth daily.   unknown at Unknown time  . Omega-3 Fatty Acids (OMEGA-3 FISH OIL) 1200 MG CAPS Take 1,200 mg by mouth every morning.   03/24/2016 at Unknown time  . omeprazole (PRILOSEC) 40 MG capsule Take 40 mg by mouth 2 (two) times daily.    03/24/2016 at Unknown time  . PARoxetine (PAXIL) 20 MG tablet  Take 20 mg by mouth 2 (two) times daily.   03/24/2016 at Unknown time  . Phenyleph-CPM-DM-Aspirin (ALKA-SELTZER PLUS COLD & COUGH PO) Take 2 capsules by mouth every 6 (six) hours as needed (cough/cold).   unknown  . potassium chloride (K-DUR) 10 MEQ tablet Take 10 mEq by mouth daily.   03/24/2016 at Unknown time  . Pseudoeph-Doxylamine-DM-APAP (DAYQUIL/NYQUIL COLD/FLU RELIEF PO) Take 2 capsules by mouth every 6 (six) hours as needed (cough/congestion).   unknown   Scheduled: . sodium chloride  1,000 mL Intravenous Once  . aspirin EC  81 mg Oral Daily  . budesonide (PULMICORT) nebulizer solution  0.25 mg Nebulization BID  . enoxaparin (LOVENOX) injection  40 mg Subcutaneous Q24H  . furosemide  40 mg Oral Daily  . losartan  100 mg Oral Daily   And  . hydrochlorothiazide  25 mg Oral Daily  . magnesium sulfate 1 - 4 g bolus IVPB  2 g Intravenous Once  . metronidazole  500 mg Intravenous Q8H  . omega-3 acid ethyl esters  1,000 mg Oral q morning - 10a  . PARoxetine  20 mg Oral BID  . potassium chloride  20 mEq Oral Daily  . sodium chloride flush  3 mL Intravenous Q12H   Continuous:  UPJ:SRPRXYVOPFYTW **OR** acetaminophen, albuterol, ALPRAZolam, metoCLOPramide (REGLAN) injection, ondansetron **OR** ondansetron (ZOFRAN) IV, oxyCODONE  Assesment: She was admitted with nausea vomiting and diarrhea. This may have been food poisoning. She is known to have cirrhosis of the liver from hepatitis C. She is markedly improved. Electrolytes are okay now. She has chronic diastolic heart failure and had vigorous fluid replacement but no evidence of exacerbation of her heart  failure. She is ready for discharge Active Problems:   Essential hypertension   Hepatic cirrhosis (HCC)   Intractable abdominal pain   Nausea vomiting and diarrhea   AKI (acute kidney injury) ([place])   Depression with anxiety   Chronic diastolic (congestive) heart failure (HCC)   Hyperglycemia   GERD (gastroesophageal reflux disease)   Hypokalemia   Hypocalcemia    Plan: Discharge home    LOS: 3 days   Taelor Waymire L 03/28/2016, 8:26 AM

## 2016-03-28 NOTE — Progress Notes (Signed)
[facility] discharged Home per MD order.  Discharge instructions reviewed and discussed with the patient, all questions and concerns answered. Copy of instructions and scripts given to patient.    Medication List    STOP taking these medications   ibuprofen 800 MG tablet Commonly known as:  ADVIL,MOTRIN     TAKE these medications   albuterol 0.63 MG/3ML nebulizer solution Commonly known as:  ACCUNEB Take 3 mLs by nebulization every 6 (six) hours as needed for wheezing.   albuterol 108 (90 Base) MCG/ACT inhaler Commonly known as:  PROVENTIL HFA;VENTOLIN HFA Inhale 1-2 puffs into the lungs every 6 (six) hours as needed for wheezing.   ALKA-SELTZER PLUS COLD & COUGH PO Take 2 capsules by mouth every 6 (six) hours as needed (cough/cold).   ALPRAZolam 0.5 MG tablet Commonly known as:  XANAX Take 1 tablet by mouth 2 (two) times daily as needed for anxiety.   aspirin EC 81 MG tablet Take 81 mg by mouth daily.   beclomethasone 40 MCG/ACT inhaler Commonly known as:  QVAR Inhale 1 puff into the lungs 2 (two) times daily as needed (shortness of breath).   clotrimazole-betamethasone cream Commonly known as:  LOTRISONE Apply 1 application topically daily as needed. rash   DAYQUIL/NYQUIL COLD/FLU RELIEF PO Take 2 capsules by mouth every 6 (six) hours as needed (cough/congestion).   ergocalciferol 50000 units capsule Commonly known as:  VITAMIN D2 Take 50,000 Units by mouth once a week. sunday   fluticasone 50 MCG/ACT nasal spray Commonly known as:  FLONASE Place 2 sprays into the nose daily as needed for allergies.   furosemide 40 MG tablet Commonly known as:  LASIX Take 40 mg by mouth daily.   HYDROcodone-acetaminophen 5-325 MG tablet Commonly known as:  NORCO/VICODIN Take 1 tablet by mouth 2 (two) times daily as needed for moderate pain.   latanoprost 0.005 % ophthalmic solution Commonly known as:  XALATAN Place 1 drop into the right eye at bedtime.    loratadine 10 MG tablet Commonly known as:  CLARITIN Take 10 mg by mouth daily.   losartan-hydrochlorothiazide 100-25 MG tablet Commonly known as:  HYZAAR Take 1 tablet by mouth daily.   multivitamin with minerals Tabs tablet Take 1 tablet by mouth daily.   Omega-3 Fish Oil 1200 MG Caps Take 1,200 mg by mouth every morning.   omeprazole 40 MG capsule Commonly known as:  PRILOSEC Take 40 mg by mouth 2 (two) times daily.   PARoxetine 20 MG tablet Commonly known as:  PAXIL Take 20 mg by mouth 2 (two) times daily.   potassium chloride 10 MEQ tablet Commonly known as:  K-DUR Take 10 mEq by mouth daily.       Patients skin is clean, dry and intact, no evidence of skin break down.  Patient escorted to car by NT in a wheelchair,  no distress noted upon discharge.  Megan Odom 03/28/2016 11:10 AM

## 2016-03-30 LAB — CULTURE, BLOOD (ROUTINE X 2)
Culture: NO GROWTH
Culture: NO GROWTH

## 2016-04-02 NOTE — Discharge Summary (Signed)
NAME:  SEMAJ, SCHMITZ NO.:  [id number]  MEDICAL RECORD NO.:  [id number]  LOCATION:  A326                          FACILITY:  [facility]  PHYSICIAN:  Kyvon Hu L. Luan Pulling, M.D.DATE OF BIRTH:  1946/08/22  DATE OF ADMISSION:  03/25/2016 DATE OF DISCHARGE:  08/07/2017LH                              DISCHARGE SUMMARY   ADDENDUM:  Abdominal pain, nausea, and vomiting due to food poisoning.     Genelda Roark L. Luan Pulling, M.D.     ELH/MEDQ  D:  04/02/2016  T:  04/02/2016  Job:  [id number]

## 2016-05-16 ENCOUNTER — Emergency Department (HOSPITAL_COMMUNITY): Payer: Commercial Managed Care - HMO

## 2016-05-16 ENCOUNTER — Emergency Department (HOSPITAL_COMMUNITY)
Admission: EM | Admit: 2016-05-16 | Discharge: 2016-05-16 | Disposition: A | Discharge status: Home / Self Care | Payer: Commercial Managed Care - HMO | Attending: Emergency Medicine | Admitting: Emergency Medicine

## 2016-05-16 ENCOUNTER — Encounter (HOSPITAL_COMMUNITY): Payer: Self-pay | Admitting: Emergency Medicine

## 2016-05-16 DIAGNOSIS — I10 Essential (primary) hypertension: Secondary | ICD-10-CM | POA: Insufficient documentation

## 2016-05-16 DIAGNOSIS — Z7982 Long term (current) use of aspirin: Secondary | ICD-10-CM | POA: Insufficient documentation

## 2016-05-16 DIAGNOSIS — M25531 Pain in right wrist: Secondary | ICD-10-CM | POA: Diagnosis not present

## 2016-05-16 DIAGNOSIS — M25511 Pain in right shoulder: Secondary | ICD-10-CM | POA: Insufficient documentation

## 2016-05-16 DIAGNOSIS — J45909 Unspecified asthma, uncomplicated: Secondary | ICD-10-CM | POA: Insufficient documentation

## 2016-05-16 DIAGNOSIS — Z79899 Other long term (current) drug therapy: Secondary | ICD-10-CM | POA: Diagnosis not present

## 2016-05-16 DIAGNOSIS — F17200 Nicotine dependence, unspecified, uncomplicated: Secondary | ICD-10-CM | POA: Diagnosis not present

## 2016-05-16 DIAGNOSIS — M25532 Pain in left wrist: Secondary | ICD-10-CM | POA: Insufficient documentation

## 2016-05-16 DIAGNOSIS — E119 Type 2 diabetes mellitus without complications: Secondary | ICD-10-CM | POA: Insufficient documentation

## 2016-05-16 MED ORDER — KETOROLAC TROMETHAMINE 60 MG/2ML IM SOLN
60.0000 mg | Freq: Once | INTRAMUSCULAR | Status: AC
  Administered 2016-05-16: 60 mg via INTRAMUSCULAR
  Filled 2016-05-16: qty 2

## 2016-05-16 MED ORDER — HYDROCODONE-ACETAMINOPHEN 5-325 MG PO TABS: ORAL_TABLET | ORAL | 0 refills | Status: DC

## 2016-05-16 NOTE — ED Triage Notes (Signed)
PT c/o left writ pain worsening x2 weeks and bilateral shoulder pain with movement x3 months. PT states no relief from ibuprofen medication at home.

## 2016-05-16 NOTE — ED Provider Notes (Signed)
[facility] DEPT Provider Note   CSN: [id number] Arrival date & time: 05/16/16  [id number]  By signing my name below, I, Megan Odom, attest that this documentation has been prepared under the direction and in the presence of Kem Parkinson, PA-C Electronically Signed: Soijett Odom, ED Scribe. 05/16/16. 11:36 AM.   History   Chief Complaint Chief Complaint  Patient presents with  . Wrist Pain    HPI  Megan Odom is a 70 y.o. female with a PMHx of arthritis, HTN, DM, hyperlipidemia, who presents to the Emergency Department complaining of constant, bilateral wrist pain left > right onset 3 weeks. This is a recurrent pain. Pt denies any recent injury, fall, or trauma. She notes that she has tried applying an ace wrap, OTC cream, and 800 mg ibuprofen for the relief of her symptoms. She denies color change, wound, rash, swelling, and any other symptoms.   Pt secondarily complains of right shoulder pain onset 3 months. Pt states that her right shoulder pain is worsened with movement and she denies any alleviating factors. Pt has tried 800 mg ibuprofen for the relief of her symptoms. Pt denies color change, rash, wound, swelling, and any other symptoms.   The history is provided by the patient. No language interpreter was used.  Wrist Pain  This is a new problem. Episode onset: 2 weeks. The problem occurs constantly. The problem has not changed since onset.The symptoms are aggravated by bending and twisting. Nothing relieves the symptoms. Treatments tried: ibuprofen, OTC cream, and ace wrap. The treatment provided no relief.    Past Medical History:  Diagnosis Date  . Arthritis   . Asthma   . Depression   . Hep C w/o coma, chronic ([place])   . Hyperlipemia   . Hypertension   . Type 2 diabetes mellitus [facility])     Patient Active Problem List   Diagnosis Date Noted  . Intractable abdominal pain 03/25/2016  . Nausea vomiting and diarrhea 03/25/2016  . AKI (acute kidney injury) ([place])  03/25/2016  . Depression with anxiety 03/25/2016  . Chronic diastolic (congestive) heart failure ([place]) 03/25/2016  . Hyperglycemia 03/25/2016  . GERD (gastroesophageal reflux disease) 03/25/2016  . Hypokalemia 03/25/2016  . Hypocalcemia 03/25/2016  . Headache 11/20/2014  . Dizziness 11/20/2014  . Hepatic cirrhosis ([place]) 11/05/2014  . Chest pain 11/05/2014  . Dysphagia 11/05/2014  . History of hepatitis C 11/05/2014  . Sleep apnea, obstructive 01/17/2013  . Paroxysmal atrial tachycardia ([place]) 01/17/2013  . Essential hypertension 01/17/2013  . Morbid obesity ([place]) 01/17/2013  . Chest pain at rest 01/17/2013  . Difficulty in walking(719.7) 02/17/2012  . Bilateral leg weakness 02/17/2012  . Back pain 02/16/2012  . Bilateral anterior knee pain 02/16/2012  . Knee pain, left 02/16/2012    Past Surgical History:  Procedure Laterality Date  . ABDOMINAL HYSTERECTOMY    . APPENDECTOMY    . BREAST SURGERY    . CHOLECYSTECTOMY    . COLONOSCOPY N/A 07/03/2014   treated/removed as above    OB History    Gravida Para Term Preterm AB Living   4 2 2   2 2    SAB TAB Ectopic Multiple Live Births   2               Home Medications    Prior to Admission medications   Medication Sig Start Date End Date Taking? Authorizing Provider  albuterol (ACCUNEB) 0.63 MG/3ML nebulizer solution Take 3 mLs by nebulization every 6 (six) hours as needed  for wheezing.    Historical Provider, MD  albuterol (PROVENTIL HFA;VENTOLIN HFA) 108 (90 BASE) MCG/ACT inhaler Inhale 1-2 puffs into the lungs every 6 (six) hours as needed for wheezing. 05/11/15   Tanna Furry, MD  ALPRAZolam Duanne Moron) 0.5 MG tablet Take 1 tablet by mouth 2 (two) times daily as needed for anxiety.  08/18/15   Historical Provider, MD  aspirin EC 81 MG tablet Take 81 mg by mouth daily.      Historical Provider, MD  beclomethasone (QVAR) 40 MCG/ACT inhaler Inhale 1 puff into the lungs 2 (two) times daily as needed (shortness of breath).      Historical Provider, MD  clotrimazole-betamethasone (LOTRISONE) cream Apply 1 application topically daily as needed. rash 02/06/16   Historical Provider, MD  ergocalciferol (VITAMIN D2) 50000 UNITS capsule Take 50,000 Units by mouth once a week. sunday    Historical Provider, MD  fluticasone (FLONASE) 50 MCG/ACT nasal spray Place 2 sprays into the nose daily as needed for allergies.     Historical Provider, MD  furosemide (LASIX) 40 MG tablet Take 40 mg by mouth daily.      Historical Provider, MD  HYDROcodone-acetaminophen (NORCO/VICODIN) 5-325 MG tablet Take 1 tablet by mouth 2 (two) times daily as needed for moderate pain.  03/08/16   Historical Provider, MD  latanoprost (XALATAN) 0.005 % ophthalmic solution Place 1 drop into the right eye at bedtime.    Historical Provider, MD  loratadine (CLARITIN) 10 MG tablet Take 10 mg by mouth daily.     Historical Provider, MD  losartan-hydrochlorothiazide (HYZAAR) 100-25 MG per tablet Take 1 tablet by mouth daily.    Historical Provider, MD  Multiple Vitamin (MULTIVITAMIN WITH MINERALS) TABS tablet Take 1 tablet by mouth daily.    Historical Provider, MD  Omega-3 Fatty Acids (OMEGA-3 FISH OIL) 1200 MG CAPS Take 1,200 mg by mouth every morning.    Historical Provider, MD  omeprazole (PRILOSEC) 40 MG capsule Take 40 mg by mouth 2 (two) times daily.     Historical Provider, MD  PARoxetine (PAXIL) 20 MG tablet Take 20 mg by mouth 2 (two) times daily.    Historical Provider, MD  Phenyleph-CPM-DM-Aspirin (ALKA-SELTZER PLUS COLD & COUGH PO) Take 2 capsules by mouth every 6 (six) hours as needed (cough/cold).    Historical Provider, MD  potassium chloride (K-DUR) 10 MEQ tablet Take 10 mEq by mouth daily.    Historical Provider, MD  Pseudoeph-Doxylamine-DM-APAP (DAYQUIL/NYQUIL COLD/FLU RELIEF PO) Take 2 capsules by mouth every 6 (six) hours as needed (cough/congestion).    Historical Provider, MD    Family History Family History  Problem Relation Age of Onset  .  Alcohol abuse Mother   . Early death Mother   . Early death Father   . Alcohol abuse Sister   . HIV Brother   . Diabetes Brother   . Heart disease Brother   . Hepatitis C Daughter   . Hypertension Son   . Heart disease Brother   . Diabetes Brother   . Cancer Maternal Aunt   . Hypertension Maternal Aunt   . Heart disease Maternal Aunt   . Cancer Maternal Uncle   . Hypertension Maternal Uncle     Social History Social History  Substance Use Topics  . Smoking status: Current Every Day Smoker    Packs/day: 0.50    Years: 50.00  . Smokeless tobacco: Never Used     Comment: one pack every 2-3 days  . Alcohol use No  Allergies   Darvocet [propoxyphene n-acetaminophen]; Latex; Other; Penicillins; Tetracyclines & related; and Adhesive [tape]   Review of Systems Review of Systems  Musculoskeletal: Positive for arthralgias (bilateral wrist, left >right and right shoulder). Negative for joint swelling.  Skin: Negative for color change, rash and wound.     Physical Exam Updated Vital Signs BP 197/84 (BP Location: Right Arm)   Pulse (!) 58   Temp 98.5 F (36.9 C) (Oral)   Resp 18   Ht 5' 5.5" (1.664 m)   Wt 220 lb (99.8 kg)   SpO2 99%   BMI 36.05 kg/m   Physical Exam  Constitutional: She is oriented to person, place, and time. She appears well-developed and well-nourished. No distress.  HENT:  Head: Normocephalic and atraumatic.  Eyes: EOM are normal.  Neck: Neck supple.  Cardiovascular: Normal rate.   Pulmonary/Chest: Effort normal. No respiratory distress.  Abdominal: She exhibits no distension.  Musculoskeletal: Normal range of motion.       Right shoulder: She exhibits tenderness and crepitus.       Right wrist: She exhibits tenderness.       Left wrist: She exhibits tenderness.  Tenderness to bilateral wrist left > right. Anterior right shoulder tenderness. Mild crepitus to right shoulder on ROM.   Neurological: She is alert and oriented to person, place,  and time.  Skin: Skin is warm and dry.  Psychiatric: She has a normal mood and affect. Her behavior is normal.  Nursing note and vitals reviewed.    ED Treatments / Results  DIAGNOSTIC STUDIES: Oxygen Saturation is 99% on RA, nl by my interpretation.    COORDINATION OF CARE: 11:34 AM Discussed treatment plan with pt at bedside which includes left wrist xray, toradol injection, referral and follow up with Dr. Lorin Mercy, and pt agreed to plan.   Radiology Dg Wrist Complete Left  Result Date: 05/16/2016 CLINICAL DATA:  70 year old female with a history of left wrist pain with movement. EXAM: LEFT WRIST - COMPLETE 3+ VIEW COMPARISON:  None. FINDINGS: Osteopenia. No acute displaced fracture. Unremarkable scaphoid view. Mild degenerative changes of the carpal bones. No radiopaque foreign body. No significant soft tissue swelling. IMPRESSION: Negative for acute bony abnormality. Degenerative changes at the wrist. Osteopenia. Signed, Dulcy Fanny. Earleen Newport, DO [facility] Radiology Electronically Signed   By: Corrie Mckusick D.O.   On: 05/16/2016 09:44    Procedures Procedures (including critical care time)  Medications Ordered in ED Medications - No data to display   Initial Impression / Assessment and Plan / ED Course  I have reviewed the triage vital signs and the nursing notes.  Pertinent imaging results that were available during my care of the patient were reviewed by me and considered in my medical decision making (see chart for details).  Clinical Course   Pt with recurrent, bilateral wrist and right shoulder pain.  NV.  No concerning sx's for septic joint.  Likely pain secondary to deg changes.   Patient X-Ray negative for obvious fracture or dislocation.  Pt advised to follow up with orthopedics. Pt treated with toradol injection while in the ED. Patient given brace while in ED, conservative therapy recommended and discussed. Patient will be  discharged home & is agreeable with above plan. Returns precautions discussed. Pt appears safe for discharge.  Final Clinical Impressions(s) / ED Diagnoses   Final diagnoses:  Pain in both wrists  Right shoulder pain    New Prescriptions New Prescriptions   No medications  on file    I personally performed the services described in this documentation, which was scribed in my presence. The recorded information has been reviewed and is accurate.'    Kem Parkinson, PA-C 05/18/16 2101    Fredia Sorrow, MD 05/21/16 [phone]

## 2016-05-16 NOTE — Discharge Instructions (Signed)
Wear the brace as needed.  Call Dr. Lorin Mercy office to arrange a follow-up appt.

## 2016-06-20 NOTE — ED Provider Notes (Signed)
[place] DEPT Provider Note   CSN: [id number] Arrival date & time: 03/25/16  1010     History   Chief Complaint Chief Complaint  Patient presents with  . Abdominal Pain    HPI Megan Odom is a 70 y.o. female.  Patient is a 70 year old female who presents to the emergency department with a complaint of abdominal pain, nausea, and vomiting.  The patient states that this problem started on yesterday August 3. Patient states that she had a pizza while in [place] area. A few hours after that she began having problems with nausea, vomiting, no abdominal pain. She states that this problem occurred during most of the evening and into the night. She had numerous watery stools, and numerous episodes of vomiting. No blood in the vomitus reported. She now has abdominal pain, that seems to be worse with certain movements. And states she just didn't feel well.  It is of note that the patient has a history of type 2 diabetes, hypertension, hepatitis C, depression, asthma, arthritis changes, and hyperlipidemia. Patient presents to the emergency department for additional evaluation and management of the abdominal pain and nausea vomiting diarrhea.   The history is provided by the patient.  Abdominal Pain   Associated symptoms include diarrhea, nausea and vomiting. Pertinent negatives include dysuria and frequency.    Past Medical History:  Diagnosis Date  . Arthritis   . Asthma   . Depression   . Hep C w/o coma, chronic ([place])   . Hyperlipemia   . Hypertension   . Type 2 diabetes mellitus [facility])     Patient Active Problem List   Diagnosis Date Noted  . Intractable abdominal pain 03/25/2016  . Nausea vomiting and diarrhea 03/25/2016  . AKI (acute kidney injury) ([place]) 03/25/2016  . Depression with anxiety 03/25/2016  . Chronic diastolic (congestive) heart failure 03/25/2016  . Hyperglycemia 03/25/2016  . GERD (gastroesophageal reflux disease) 03/25/2016  . Hypokalemia  03/25/2016  . Hypocalcemia 03/25/2016  . Headache 11/20/2014  . Dizziness 11/20/2014  . Hepatic cirrhosis ([place]) 11/05/2014  . Chest pain 11/05/2014  . Dysphagia 11/05/2014  . History of hepatitis C 11/05/2014  . Sleep apnea, obstructive 01/17/2013  . Paroxysmal atrial tachycardia ([place]) 01/17/2013  . Essential hypertension 01/17/2013  . Morbid obesity ([place]) 01/17/2013  . Chest pain at rest 01/17/2013  . Difficulty in walking(719.7) 02/17/2012  . Bilateral leg weakness 02/17/2012  . Back pain 02/16/2012  . Bilateral anterior knee pain 02/16/2012  . Knee pain, left 02/16/2012    Past Surgical History:  Procedure Laterality Date  . ABDOMINAL HYSTERECTOMY    . APPENDECTOMY    . BREAST SURGERY    . CHOLECYSTECTOMY    . COLONOSCOPY N/A 07/03/2014   treated/removed as above    OB History    Gravida Para Term Preterm AB Living   4 2 2   2 2    SAB TAB Ectopic Multiple Live Births   2               Home Medications    Prior to Admission medications   Medication Sig Start Date End Date Taking? Authorizing Provider  albuterol (ACCUNEB) 0.63 MG/3ML nebulizer solution Take 3 mLs by nebulization every 6 (six) hours as needed for wheezing.   Yes Historical Provider, MD  albuterol (PROVENTIL HFA;VENTOLIN HFA) 108 (90 BASE) MCG/ACT inhaler Inhale 1-2 puffs into the lungs every 6 (six) hours as needed for wheezing. 05/11/15  Yes Tanna Furry, MD  ALPRAZolam Duanne Moron) 0.5  MG tablet Take 1 tablet by mouth 2 (two) times daily as needed for anxiety.  08/18/15  Yes Historical Provider, MD  aspirin EC 81 MG tablet Take 81 mg by mouth daily.     Yes Historical Provider, MD  beclomethasone (QVAR) 40 MCG/ACT inhaler Inhale 1 puff into the lungs 2 (two) times daily as needed (shortness of breath).    Yes Historical Provider, MD  clotrimazole-betamethasone (LOTRISONE) cream Apply 1 application topically daily as needed. rash 02/06/16  Yes Historical Provider, MD  ergocalciferol (VITAMIN D2) 50000 UNITS  capsule Take 50,000 Units by mouth once a week. sunday   Yes Historical Provider, MD  fluticasone (FLONASE) 50 MCG/ACT nasal spray Place 2 sprays into the nose daily as needed for allergies.    Yes Historical Provider, MD  furosemide (LASIX) 40 MG tablet Take 40 mg by mouth daily.     Yes Historical Provider, MD  latanoprost (XALATAN) 0.005 % ophthalmic solution Place 1 drop into the right eye at bedtime.   Yes Historical Provider, MD  loratadine (CLARITIN) 10 MG tablet Take 10 mg by mouth daily.    Yes Historical Provider, MD  losartan-hydrochlorothiazide (HYZAAR) 100-25 MG per tablet Take 1 tablet by mouth daily.   Yes Historical Provider, MD  Multiple Vitamin (MULTIVITAMIN WITH MINERALS) TABS tablet Take 1 tablet by mouth daily.   Yes Historical Provider, MD  Omega-3 Fatty Acids (OMEGA-3 FISH OIL) 1200 MG CAPS Take 1,200 mg by mouth every morning.   Yes Historical Provider, MD  omeprazole (PRILOSEC) 40 MG capsule Take 40 mg by mouth 2 (two) times daily.    Yes Historical Provider, MD  PARoxetine (PAXIL) 20 MG tablet Take 20 mg by mouth 2 (two) times daily.   Yes Historical Provider, MD  Phenyleph-CPM-DM-Aspirin (ALKA-SELTZER PLUS COLD & COUGH PO) Take 2 capsules by mouth every 6 (six) hours as needed (cough/cold).   Yes Historical Provider, MD  potassium chloride (K-DUR) 10 MEQ tablet Take 10 mEq by mouth daily.   Yes Historical Provider, MD  Pseudoeph-Doxylamine-DM-APAP (DAYQUIL/NYQUIL COLD/FLU RELIEF PO) Take 2 capsules by mouth every 6 (six) hours as needed (cough/congestion).   Yes Historical Provider, MD  HYDROcodone-acetaminophen (NORCO/VICODIN) 5-325 MG tablet Take one tab po q 4-6 hrs prn pain 05/16/16   Kem Parkinson, PA-C    Family History Family History  Problem Relation Age of Onset  . Alcohol abuse Mother   . Early death Mother   . Early death Father   . Alcohol abuse Sister   . HIV Brother   . Diabetes Brother   . Heart disease Brother   . Hepatitis C Daughter   .  Hypertension Son   . Heart disease Brother   . Diabetes Brother   . Cancer Maternal Aunt   . Hypertension Maternal Aunt   . Heart disease Maternal Aunt   . Cancer Maternal Uncle   . Hypertension Maternal Uncle     Social History Social History  Substance Use Topics  . Smoking status: Current Every Day Smoker    Packs/day: 0.50    Years: 50.00  . Smokeless tobacco: Never Used     Comment: one pack every 2-3 days  . Alcohol use No     Allergies   Darvocet [propoxyphene n-acetaminophen]; Latex; Other; Penicillins; Tetracyclines & related; and Adhesive [tape]   Review of Systems Review of Systems  Constitutional: Positive for activity change, appetite change and chills.  Gastrointestinal: Positive for abdominal pain, diarrhea, nausea and vomiting.  Genitourinary: Negative for  dysuria and frequency.  Psychiatric/Behavioral: Negative for suicidal ideas.       Depression  All other systems reviewed and are negative.    Physical Exam Updated Vital Signs BP (!) 149/72 (BP Location: Left Arm)   Pulse (!) 47   Temp 98 F (36.7 C) (Oral)   Resp 20   Ht 5\' 5"  (1.651 m)   Wt 105.1 kg   SpO2 95%   BMI 38.56 kg/m   Physical Exam  Constitutional: She appears well-developed and well-nourished. She appears distressed.  HENT:  Head: Normocephalic and atraumatic.  Right Ear: External ear normal.  Left Ear: External ear normal.  Eyes: Conjunctivae are normal. Right eye exhibits no discharge. Left eye exhibits no discharge. No scleral icterus.  Neck: Neck supple. No tracheal deviation present.  Cardiovascular: Normal rate, regular rhythm, normal heart sounds and intact distal pulses.  Exam reveals no gallop and no friction rub.   No murmur heard. Pulmonary/Chest: Effort normal and breath sounds normal. No stridor. No respiratory distress. She has no wheezes. She has no rales.  Abdominal: Soft. Bowel sounds are normal. She exhibits no distension. There is tenderness. There is no  rebound and no guarding.  Bowel sounds are present and active. There is no hepatomegaly or splenomegaly appreciated. There is no mass or pulsating mass appreciated. There is diffuse tenderness throughout the abdomen. No CVA tenderness appreciated.  Musculoskeletal: She exhibits no edema or tenderness.  Neurological: She is alert. She has normal strength. No cranial nerve deficit (no facial droop, extraocular movements intact, no slurred speech) or sensory deficit. She exhibits normal muscle tone. She displays no seizure activity. Coordination normal.  Skin: Skin is warm and dry. No rash noted.  Psychiatric: She has a normal mood and affect. Judgment normal.  Nursing note and vitals reviewed.    ED Treatments / Results  Labs (all labs ordered are listed, but only abnormal results are displayed) Labs Reviewed  CBC WITH DIFFERENTIAL/PLATELET - Abnormal; Notable for the following:       Result Value   WBC 15.2 (*)    Neutro Abs 12.4 (*)    All other components within normal limits  COMPREHENSIVE METABOLIC PANEL - Abnormal; Notable for the following:    Potassium 2.9 (*)    CO2 21 (*)    Glucose, Bld 121 (*)    BUN 25 (*)    Creatinine, Ser 1.25 (*)    Calcium 8.5 (*)    Total Protein 9.4 (*)    GFR calc non Af Amer 43 (*)    GFR calc Af Amer 50 (*)    All other components within normal limits  BASIC METABOLIC PANEL - Abnormal; Notable for the following:    Potassium 2.7 (*)    Glucose, Bld 118 (*)    BUN 24 (*)    Creatinine, Ser 1.07 (*)    Calcium 7.8 (*)    GFR calc non Af Amer 52 (*)    All other components within normal limits  MAGNESIUM - Abnormal; Notable for the following:    Magnesium 1.4 (*)    All other components within normal limits  CBC - Abnormal; Notable for the following:    RBC 3.69 (*)    Hemoglobin 10.8 (*)    HCT 32.6 (*)    All other components within normal limits  COMPREHENSIVE METABOLIC PANEL - Abnormal; Notable for the following:    Chloride 113  (*)    Calcium 8.0 (*)    Albumin  3.1 (*)    Anion gap 3 (*)    All other components within normal limits  GLUCOSE, CAPILLARY - Abnormal; Notable for the following:    Glucose-Capillary 127 (*)    All other components within normal limits  GLUCOSE, CAPILLARY - Abnormal; Notable for the following:    Glucose-Capillary 109 (*)    All other components within normal limits  GLUCOSE, CAPILLARY - Abnormal; Notable for the following:    Glucose-Capillary 105 (*)    All other components within normal limits  GLUCOSE, CAPILLARY - Abnormal; Notable for the following:    Glucose-Capillary 118 (*)    All other components within normal limits  GLUCOSE, CAPILLARY - Abnormal; Notable for the following:    Glucose-Capillary 122 (*)    All other components within normal limits  BASIC METABOLIC PANEL - Abnormal; Notable for the following:    Potassium 3.4 (*)    Calcium 8.2 (*)    All other components within normal limits  MAGNESIUM - Abnormal; Notable for the following:    Magnesium 1.3 (*)    All other components within normal limits  CULTURE, BLOOD (ROUTINE X 2)  CULTURE, BLOOD (ROUTINE X 2)  LIPASE, BLOOD  URINALYSIS, ROUTINE W REFLEX MICROSCOPIC (NOT AT [facility])  TROPONIN I  PHOSPHORUS  LACTIC ACID, PLASMA  CALCIUM, IONIZED  HEMOGLOBIN A1C  GLUCOSE, CAPILLARY  HIV ANTIBODY (ROUTINE TESTING)  GLUCOSE, CAPILLARY  GLUCOSE, CAPILLARY    EKG  EKG Interpretation  Date/Time:  Friday March 25 2016 11:05:47 EDT Ventricular Rate:  61 PR Interval:    QRS Duration: 110 QT Interval:  402 QTC Calculation: 405 R Axis:   -43 Text Interpretation:  Sinus rhythm Abnormal R-wave progression, early transition Left ventricular hypertrophy Confirmed by ZACKOWSKI  MD, SCOTT ([id number]) on 03/25/2016 11:20:14 AM       Radiology No results found.  Procedures Procedures (including critical care time)  Medications Ordered in ED Medications  acetaminophen (TYLENOL) 500 MG tablet (not administered)    diatrizoate meglumine-sodium (GASTROGRAFIN) 66-10 % solution (not administered)  prochlorperazine (COMPAZINE) injection 5 mg (5 mg Intravenous Given 03/25/16 1105)  morphine 2 MG/ML injection 2 mg (2 mg Intravenous Given 03/25/16 1104)  ipratropium-albuterol (DUONEB) 0.5-2.5 (3) MG/3ML nebulizer solution 3 mL (3 mLs Nebulization Given 03/25/16 1114)  potassium chloride SA (K-DUR,KLOR-CON) CR tablet 40 mEq (40 mEq Oral Given 03/25/16 1212)  0.9 %  sodium chloride infusion (0 mLs Intravenous Stopped 03/25/16 1437)  potassium chloride 10 mEq in 100 mL IVPB (0 mEq Intravenous Stopped 03/25/16 1634)  potassium chloride SA (K-DUR,KLOR-CON) CR tablet 40 mEq (40 mEq Oral Given 03/25/16 1641)  acetaminophen (TYLENOL) tablet 1,000 mg (1,000 mg Oral Given 03/25/16 1642)  potassium chloride SA (K-DUR,KLOR-CON) CR tablet 40 mEq (40 mEq Oral Given 03/26/16 0117)  iopamidol (ISOVUE-300) 61 % injection 100 mL (100 mLs Intravenous Contrast Given 03/25/16 2000)  potassium chloride SA (K-DUR,KLOR-CON) CR tablet 40 mEq (40 mEq Oral Given 03/28/16 0913)     Initial Impression / Assessment and Plan / ED Course  I have reviewed the triage vital signs and the nursing notes.  Pertinent labs & imaging results that were available during my care of the patient were reviewed by me and considered in my medical decision making (see chart for details).  Clinical Course    *I have reviewed nursing notes, vital signs, and all appropriate lab and imaging results for this patient.**  Final Clinical Impressions(s) / ED Diagnoses   Pt seen with me by Dr  Zackowski. MDM. No vomiting after compazine, but continues to have diarrhea. Wheezing improved after treatment, but not resolved.  Potassium low, oral potassium given.  Bmet recheck after fluids reveals the potassium lower at 2.7. Pt seen with me again by Dr Rogene Houston. Will discuss with hospitalist for admission. N/V improved, but pt having some diarrhea.  Case discussed with  Hospitalist. Pt to be given a additional dose of oral potassium along with IV KCl, check magnesium, and give additional fluids. Pt to receive IV mag if abnormal. Hospitalist team will see pt in the ED.       Final diagnoses:  Intractable abdominal pain  Hypokalemia    New Prescriptions Discharge Medication List as of 03/28/2016  9:25 AM       Lily Kocher, PA-C 06/20/16 1411    Fredia Sorrow, MD 06/29/16 [phone]

## 2016-07-18 ENCOUNTER — Emergency Department (HOSPITAL_COMMUNITY)
Admission: EM | Admit: 2016-07-18 | Discharge: 2016-07-18 | Disposition: A | Discharge status: Home / Self Care | Payer: Commercial Managed Care - HMO | Attending: Emergency Medicine | Admitting: Emergency Medicine

## 2016-07-18 ENCOUNTER — Encounter (HOSPITAL_COMMUNITY): Payer: Self-pay | Admitting: Emergency Medicine

## 2016-07-18 ENCOUNTER — Emergency Department (HOSPITAL_COMMUNITY): Payer: Commercial Managed Care - HMO

## 2016-07-18 DIAGNOSIS — I5032 Chronic diastolic (congestive) heart failure: Secondary | ICD-10-CM | POA: Diagnosis not present

## 2016-07-18 DIAGNOSIS — Z79899 Other long term (current) drug therapy: Secondary | ICD-10-CM | POA: Diagnosis not present

## 2016-07-18 DIAGNOSIS — E119 Type 2 diabetes mellitus without complications: Secondary | ICD-10-CM | POA: Insufficient documentation

## 2016-07-18 DIAGNOSIS — R0602 Shortness of breath: Secondary | ICD-10-CM | POA: Diagnosis not present

## 2016-07-18 DIAGNOSIS — Z7982 Long term (current) use of aspirin: Secondary | ICD-10-CM | POA: Insufficient documentation

## 2016-07-18 DIAGNOSIS — I110 Hypertensive heart disease with heart failure: Secondary | ICD-10-CM | POA: Insufficient documentation

## 2016-07-18 DIAGNOSIS — J45901 Unspecified asthma with (acute) exacerbation: Secondary | ICD-10-CM | POA: Diagnosis not present

## 2016-07-18 DIAGNOSIS — F17210 Nicotine dependence, cigarettes, uncomplicated: Secondary | ICD-10-CM | POA: Diagnosis not present

## 2016-07-18 DIAGNOSIS — F17200 Nicotine dependence, unspecified, uncomplicated: Secondary | ICD-10-CM | POA: Diagnosis not present

## 2016-07-18 LAB — CBC WITH DIFFERENTIAL/PLATELET
Basophils Absolute: 0 10*3/uL (ref 0.0–0.1)
Basophils Relative: 0 %
Eosinophils Absolute: 0.1 10*3/uL (ref 0.0–0.7)
Eosinophils Relative: 2 %
HCT: 37.5 % (ref 36.0–46.0)
Hemoglobin: 11.9 g/dL — ABNORMAL LOW (ref 12.0–15.0)
Lymphocytes Relative: 41 %
Lymphs Abs: 3.4 10*3/uL (ref 0.7–4.0)
MCH: 28.7 pg (ref 26.0–34.0)
MCHC: 31.7 g/dL (ref 30.0–36.0)
MCV: 90.6 fL (ref 78.0–100.0)
Monocytes Absolute: 0.5 10*3/uL (ref 0.1–1.0)
Monocytes Relative: 6 %
Neutro Abs: 4.2 10*3/uL (ref 1.7–7.7)
Neutrophils Relative %: 51 %
Platelets: 260 10*3/uL (ref 150–400)
RBC: 4.14 MIL/uL (ref 3.87–5.11)
RDW: 12.9 % (ref 11.5–15.5)
WBC: 8.3 10*3/uL (ref 4.0–10.5)

## 2016-07-18 LAB — COMPREHENSIVE METABOLIC PANEL
ALT: 15 U/L (ref 14–54)
AST: 18 U/L (ref 15–41)
Albumin: 3.4 g/dL — ABNORMAL LOW (ref 3.5–5.0)
Alkaline Phosphatase: 97 U/L (ref 38–126)
Anion gap: 5 (ref 5–15)
BUN: 12 mg/dL (ref 6–20)
CO2: 27 mmol/L (ref 22–32)
Calcium: 8.6 mg/dL — ABNORMAL LOW (ref 8.9–10.3)
Chloride: 104 mmol/L (ref 101–111)
Creatinine, Ser: 0.67 mg/dL (ref 0.44–1.00)
GFR calc Af Amer: >60 mL/min (ref >60)
GFR calc non Af Amer: >60 mL/min (ref >60)
Glucose, Bld: 113 mg/dL — ABNORMAL HIGH (ref 65–99)
Potassium: 3.3 mmol/L — ABNORMAL LOW (ref 3.5–5.1)
Sodium: 136 mmol/L (ref 135–145)
Total Bilirubin: 0.1 mg/dL — ABNORMAL LOW (ref 0.3–1.2)
Total Protein: 8.4 g/dL — ABNORMAL HIGH (ref 6.5–8.1)

## 2016-07-18 LAB — TROPONIN I: Troponin I: <0.03 ng/mL (ref <0.03)

## 2016-07-18 MED ORDER — HYDROCODONE-ACETAMINOPHEN 5-325 MG PO TABS
2.0000 | ORAL_TABLET | Freq: Once | ORAL | Status: AC
  Administered 2016-07-18: 2 via ORAL
  Filled 2016-07-18: qty 2

## 2016-07-18 MED ORDER — ALBUTEROL SULFATE (2.5 MG/3ML) 0.083% IN NEBU
5.0000 mg | INHALATION_SOLUTION | Freq: Once | RESPIRATORY_TRACT | Status: AC
  Administered 2016-07-18: 5 mg via RESPIRATORY_TRACT
  Filled 2016-07-18: qty 6

## 2016-07-18 MED ORDER — ALBUTEROL SULFATE HFA 108 (90 BASE) MCG/ACT IN AERS
INHALATION_SPRAY | RESPIRATORY_TRACT | Status: AC
  Administered 2016-07-18: 19:00:00
  Filled 2016-07-18: qty 6.7

## 2016-07-18 MED ORDER — ALBUTEROL SULFATE HFA 108 (90 BASE) MCG/ACT IN AERS
2.0000 | INHALATION_SPRAY | Freq: Once | RESPIRATORY_TRACT | Status: AC
  Administered 2016-07-18: 2 via RESPIRATORY_TRACT
  Filled 2016-07-18: qty 6.7

## 2016-07-18 MED ORDER — ALBUTEROL SULFATE HFA 108 (90 BASE) MCG/ACT IN AERS: 2.0000 | INHALATION_SPRAY | RESPIRATORY_TRACT | 3 refills | Status: DC | PRN

## 2016-07-18 MED ORDER — PREDNISONE 20 MG PO TABS
40.0000 mg | ORAL_TABLET | Freq: Once | ORAL | Status: AC
  Administered 2016-07-18: 40 mg via ORAL
  Filled 2016-07-18: qty 2

## 2016-07-18 MED ORDER — PREDNISONE 20 MG PO TABS: 40.0000 mg | ORAL_TABLET | Freq: Every day | ORAL | 0 refills | Status: DC

## 2016-07-18 NOTE — ED Notes (Signed)
Pt not in room.

## 2016-07-18 NOTE — ED Notes (Signed)
Pt on monitor and RT at the bedside

## 2016-07-18 NOTE — Discharge Instructions (Signed)
Prednisone once daily for 5 days  Albuterol every 4 hours as needed for difficulty breathing / shortness of breath or wheezing.  Please obtain all of your results from medical records or have your doctors office obtain the results - share them with your doctor - you should be seen at your doctors office in the next 2 days. Call today to arrange your follow up. Take the medications as prescribed. Please review all of the medicines and only take them if you do not have an allergy to them. Please be aware that if you are taking birth control pills, taking other prescriptions, ESPECIALLY ANTIBIOTICS may make the birth control ineffective - if this is the case, either do not engage in sexual activity or use alternative methods of birth control such as condoms until you have finished the medicine and your family doctor says it is OK to restart them. If you are on a blood thinner such as COUMADIN, be aware that any other medicine that you take may cause the coumadin to either work too much, or not enough - you should have your coumadin level rechecked in next 7 days if this is the case.  ?  It is also a possibility that you have an allergic reaction to any of the medicines that you have been prescribed - Everybody reacts differently to medications and while MOST people have no trouble with most medicines, you may have a reaction such as nausea, vomiting, rash, swelling, shortness of breath. If this is the case, please stop taking the medicine immediately and contact your physician.  ?  You should return to the ER if you develop severe or worsening symptoms.

## 2016-07-18 NOTE — ED Triage Notes (Signed)
Pt c/o SOB x 2-3 weeks. Pt states she has been using her inhaler and nebs but has not had any relief. Pt also c/o R shoulder pain that radiates up in to her neck, chronic.

## 2016-07-18 NOTE — ED Notes (Signed)
Pt now in room, in gown, ambulatory to the bathroom. Called RT

## 2016-07-18 NOTE — ED Provider Notes (Signed)
[place] DEPT Provider Note   CSN: [id number] Arrival date & time: 07/18/16  1628     History   Chief Complaint Chief Complaint  Patient presents with  . Shortness of Breath    HPI Megan Odom is a 70 y.o. female.  HPI  The patient is a 70 year old female, she has a history of asthma, she takes her medications as needed including albuterol inhaler but states that she has since run out of her inhaler and has become more short of breath over the last couple of days with a cough that is nonproductive. She denies any chest pain, there is no swelling of the legs, she has had no fevers and denies any sore throat or runny nose. She does endorse having some wheezing. The symptoms are gradually worsening, nothing seems to make this better, worse with exertion. She endorses that this is similar to prior asthma attack  Past Medical History:  Diagnosis Date  . Arthritis   . Asthma   . Depression   . Hep C w/o coma, chronic ([place])   . Hyperlipemia   . Hypertension   . Type 2 diabetes mellitus [facility])     Patient Active Problem List   Diagnosis Date Noted  . Intractable abdominal pain 03/25/2016  . Nausea vomiting and diarrhea 03/25/2016  . AKI (acute kidney injury) ([place]) 03/25/2016  . Depression with anxiety 03/25/2016  . Chronic diastolic (congestive) heart failure 03/25/2016  . Hyperglycemia 03/25/2016  . GERD (gastroesophageal reflux disease) 03/25/2016  . Hypokalemia 03/25/2016  . Hypocalcemia 03/25/2016  . Headache 11/20/2014  . Dizziness 11/20/2014  . Hepatic cirrhosis ([place]) 11/05/2014  . Chest pain 11/05/2014  . Dysphagia 11/05/2014  . History of hepatitis C 11/05/2014  . Sleep apnea, obstructive 01/17/2013  . Paroxysmal atrial tachycardia ([place]) 01/17/2013  . Essential hypertension 01/17/2013  . Morbid obesity ([place]) 01/17/2013  . Chest pain at rest 01/17/2013  . Difficulty in walking(719.7) 02/17/2012  . Bilateral leg weakness 02/17/2012  . Back pain  02/16/2012  . Bilateral anterior knee pain 02/16/2012  . Knee pain, left 02/16/2012    Past Surgical History:  Procedure Laterality Date  . ABDOMINAL HYSTERECTOMY    . APPENDECTOMY    . BREAST SURGERY    . CHOLECYSTECTOMY    . COLONOSCOPY N/A 07/03/2014   treated/removed as above    OB History    Gravida Para Term Preterm AB Living   4 2 2   2 2    SAB TAB Ectopic Multiple Live Births   2               Home Medications    Prior to Admission medications   Medication Sig Start Date End Date Taking? Authorizing Provider  albuterol (ACCUNEB) 0.63 MG/3ML nebulizer solution Take 3 mLs by nebulization every 6 (six) hours as needed for wheezing.   Yes Historical Provider, MD  aspirin EC 81 MG tablet Take 81 mg by mouth daily.     Yes Historical Provider, MD  fluticasone (FLONASE) 50 MCG/ACT nasal spray Place 2 sprays into the nose daily as needed for allergies.    Yes Historical Provider, MD  furosemide (LASIX) 40 MG tablet Take 40 mg by mouth daily.     Yes Historical Provider, MD  ibuprofen (ADVIL,MOTRIN) 800 MG tablet Take 800-1,600 mg by mouth daily as needed for mild pain or moderate pain.  06/23/16  Yes Historical Provider, MD  latanoprost (XALATAN) 0.005 % ophthalmic solution Place 1 drop into the right eye  at bedtime.   Yes Historical Provider, MD  losartan-hydrochlorothiazide (HYZAAR) 100-25 MG per tablet Take 1 tablet by mouth daily.   Yes Historical Provider, MD  Multiple Vitamin (MULTIVITAMIN WITH MINERALS) TABS tablet Take 1 tablet by mouth daily.   Yes Historical Provider, MD  omeprazole (PRILOSEC) 40 MG capsule Take 40 mg by mouth 2 (two) times daily.    Yes Historical Provider, MD  Phenyleph-CPM-DM-Aspirin (ALKA-SELTZER PLUS COLD & COUGH PO) Take 2 capsules by mouth every 6 (six) hours as needed (cough/cold).   Yes Historical Provider, MD  potassium chloride (K-DUR) 10 MEQ tablet Take 10 mEq by mouth daily.   Yes Historical Provider, MD  Pseudoeph-Doxylamine-DM-APAP  (DAYQUIL/NYQUIL COLD/FLU RELIEF PO) Take 2 capsules by mouth every 6 (six) hours as needed (cough/congestion).   Yes Historical Provider, MD  verapamil (VERELAN PM) 180 MG 24 hr capsule Take 180 mg by mouth daily. 06/23/16  Yes Historical Provider, MD  albuterol (PROVENTIL HFA;VENTOLIN HFA) 108 (90 Base) MCG/ACT inhaler Inhale 2 puffs into the lungs every 4 (four) hours as needed for wheezing or shortness of breath. 07/18/16   Noemi Chapel, MD  beclomethasone (QVAR) 40 MCG/ACT inhaler Inhale 1 puff into the lungs 2 (two) times daily as needed (shortness of breath).     Historical Provider, MD  ergocalciferol (VITAMIN D2) 50000 UNITS capsule Take 50,000 Units by mouth once a week. sunday    Historical Provider, MD  gabapentin (NEURONTIN) 300 MG capsule Take 300 mg by mouth 3 (three) times daily.  06/24/16   Historical Provider, MD  loratadine (CLARITIN) 10 MG tablet Take 10 mg by mouth daily.     Historical Provider, MD  PARoxetine (PAXIL) 20 MG tablet Take 20 mg by mouth 2 (two) times daily.    Historical Provider, MD  predniSONE (DELTASONE) 20 MG tablet Take 2 tablets (40 mg total) by mouth daily. 07/18/16   Noemi Chapel, MD    Family History Family History  Problem Relation Age of Onset  . Alcohol abuse Mother   . Early death Mother   . Early death Father   . Alcohol abuse Sister   . HIV Brother   . Diabetes Brother   . Heart disease Brother   . Hepatitis C Daughter   . Hypertension Son   . Heart disease Brother   . Diabetes Brother   . Cancer Maternal Aunt   . Hypertension Maternal Aunt   . Heart disease Maternal Aunt   . Cancer Maternal Uncle   . Hypertension Maternal Uncle     Social History Social History  Substance Use Topics  . Smoking status: Current Every Day Smoker    Packs/day: 0.50    Years: 50.00  . Smokeless tobacco: Never Used     Comment: one pack every 2-3 days  . Alcohol use No     Allergies   Darvocet [propoxyphene n-acetaminophen]; Latex; Other;  Penicillins; Tetracyclines & related; and Adhesive [tape]   Review of Systems Review of Systems  All other systems reviewed and are negative.    Physical Exam Updated Vital Signs BP 174/96   Pulse 74   Temp 98.1 F (36.7 C) (Oral)   Resp 23   Ht 5' 5.6" (1.666 m)   Wt 241 lb 14.4 oz (109.7 kg)   SpO2 100%   BMI 39.52 kg/m   Physical Exam  Constitutional: She appears well-developed and well-nourished. No distress.  HENT:  Head: Normocephalic and atraumatic.  Mouth/Throat: Oropharynx is clear and moist. No oropharyngeal exudate.  Eyes: Conjunctivae and EOM are normal. Pupils are equal, round, and reactive to light. Right eye exhibits no discharge. Left eye exhibits no discharge. No scleral icterus.  Neck: Normal range of motion. Neck supple. No JVD present. No thyromegaly present.  Cardiovascular: Normal rate, regular rhythm, normal heart sounds and intact distal pulses.  Exam reveals no gallop and no friction rub.   No murmur heard. Pulmonary/Chest: Effort normal. No respiratory distress. She has wheezes. She has no rales.  The patient does not have an increased work of breathing, she speaks in full sentences, she has diffuse expiratory wheezing, there is no rales, no accessory muscle use  Abdominal: Soft. Bowel sounds are normal. She exhibits no distension and no mass. There is no tenderness.  Musculoskeletal: Normal range of motion. She exhibits no edema or tenderness.  Lymphadenopathy:    She has no cervical adenopathy.  Neurological: She is alert. Coordination normal.  Skin: Skin is warm and dry. No rash noted. No erythema.  Psychiatric: She has a normal mood and affect. Her behavior is normal.  Nursing note and vitals reviewed.    ED Treatments / Results  Labs (all labs ordered are listed, but only abnormal results are displayed) Labs Reviewed  CBC WITH DIFFERENTIAL/PLATELET - Abnormal; Notable for the following:       Result Value   Hemoglobin 11.9 (*)    All  other components within normal limits  COMPREHENSIVE METABOLIC PANEL - Abnormal; Notable for the following:    Potassium 3.3 (*)    Glucose, Bld 113 (*)    Calcium 8.6 (*)    Total Protein 8.4 (*)    Albumin 3.4 (*)    Total Bilirubin 0.1 (*)    All other components within normal limits  TROPONIN I    EKG  EKG Interpretation  Date/Time:  Monday July 18 2016 16:36:43 EST Ventricular Rate:  60 PR Interval:  200 QRS Duration: 108 QT Interval:  454 QTC Calculation: [id number] R Axis:   -23 Text Interpretation:  Normal sinus rhythm Left ventricular hypertrophy with repolarization abnormality Cannot rule out Septal infarct , age undetermined Abnormal ECG potential U waves Since last tracing rate slower Normal sinus rhythm Confirmed by Sabra Heck  MD, Niccolas Loeper ([id number]) on 07/18/2016 5:26:23 PM       Radiology Dg Chest 2 View  Result Date: 07/18/2016 CLINICAL DATA:  Pt c/o SOB x 2-3 weeks. Pt states she has been using her inhaler and nebs but has not had any relief. Pt also c/o R shoulder pain that radiates up in to her neck, chronic.Hx of asthma, HTN, diabetes. Smoker x0,5ppd x 50 years. EXAM: CHEST  2 VIEW COMPARISON:  03/25/2016 FINDINGS: Cardiac silhouette is normal in size. No mediastinal or hilar masses. No evidence of adenopathy. Mildly prominent bronchovascular markings are noted, stable. Lungs are otherwise clear. No pleural effusion.  No pneumothorax. Skeletal structures are demineralized but grossly intact. IMPRESSION: No active cardiopulmonary disease. Electronically Signed   By: Lajean Manes M.D.   On: 07/18/2016 17:19    Procedures Procedures (including critical care time)  Medications Ordered in ED Medications  albuterol (PROVENTIL) (2.5 MG/3ML) 0.083% nebulizer solution 5 mg (5 mg Nebulization Given 07/18/16 1831)  albuterol (PROVENTIL HFA;VENTOLIN HFA) 108 (90 Base) MCG/ACT inhaler (  Given 07/18/16 1843)  albuterol (PROVENTIL HFA;VENTOLIN HFA) 108 (90 Base) MCG/ACT inhaler 2  puff (2 puffs Inhalation Given 07/18/16 1901)  predniSONE (DELTASONE) tablet 40 mg (40 mg Oral Given 07/18/16 1901)     Initial Impression /  Assessment and Plan / ED Course  I have reviewed the triage vital signs and the nursing notes.  Pertinent labs & imaging results that were available during my care of the patient were reviewed by me and considered in my medical decision making (see chart for details).  Clinical Course     The x-ray was unremarkable, there is no signs of pneumonia, she does appear to be having recurrent reactive airway disease and will need albuterol treatment, steroids, she does not have any money for an MDI that she will be given an MDI for home.  X-ray reviewed and unremarkable, also the lab work was unremarkable as was the EKG, at this time the patient appears stable for discharge, I do not see any reason that she cannot go home as she has improved significantly, she has been given in meter dose inhaler for home, has been given steroids and has no signs of pneumonia or cardiac dysfunction at this time. She has expressed her understanding to the indications for return, she will work on seeing her family doctor for ongoing care of her asthma.  Final Clinical Impressions(s) / ED Diagnoses   Final diagnoses:  Moderate asthma with exacerbation, unspecified whether persistent    New Prescriptions New Prescriptions   ALBUTEROL (PROVENTIL HFA;VENTOLIN HFA) 108 (90 BASE) MCG/ACT INHALER    Inhale 2 puffs into the lungs every 4 (four) hours as needed for wheezing or shortness of breath.   PREDNISONE (DELTASONE) 20 MG TABLET    Take 2 tablets (40 mg total) by mouth daily.     Noemi Chapel, MD 07/18/16 [id number]

## 2016-10-05 ENCOUNTER — Emergency Department (HOSPITAL_COMMUNITY)
Admission: EM | Admit: 2016-10-05 | Discharge: 2016-10-05 | Disposition: A | Discharge status: Home / Self Care | Payer: Medicare Other | Attending: Emergency Medicine | Admitting: Emergency Medicine

## 2016-10-05 ENCOUNTER — Emergency Department (HOSPITAL_COMMUNITY): Payer: Medicare Other

## 2016-10-05 ENCOUNTER — Encounter (HOSPITAL_COMMUNITY): Payer: Self-pay | Admitting: Emergency Medicine

## 2016-10-05 DIAGNOSIS — J45909 Unspecified asthma, uncomplicated: Secondary | ICD-10-CM | POA: Insufficient documentation

## 2016-10-05 DIAGNOSIS — F17200 Nicotine dependence, unspecified, uncomplicated: Secondary | ICD-10-CM | POA: Diagnosis not present

## 2016-10-05 DIAGNOSIS — Z79899 Other long term (current) drug therapy: Secondary | ICD-10-CM | POA: Diagnosis not present

## 2016-10-05 DIAGNOSIS — Z7982 Long term (current) use of aspirin: Secondary | ICD-10-CM | POA: Insufficient documentation

## 2016-10-05 DIAGNOSIS — M19011 Primary osteoarthritis, right shoulder: Secondary | ICD-10-CM | POA: Insufficient documentation

## 2016-10-05 DIAGNOSIS — E119 Type 2 diabetes mellitus without complications: Secondary | ICD-10-CM | POA: Insufficient documentation

## 2016-10-05 DIAGNOSIS — R51 Headache: Secondary | ICD-10-CM | POA: Diagnosis not present

## 2016-10-05 DIAGNOSIS — M19019 Primary osteoarthritis, unspecified shoulder: Secondary | ICD-10-CM

## 2016-10-05 DIAGNOSIS — R197 Diarrhea, unspecified: Secondary | ICD-10-CM | POA: Diagnosis not present

## 2016-10-05 DIAGNOSIS — R531 Weakness: Secondary | ICD-10-CM | POA: Diagnosis present

## 2016-10-05 DIAGNOSIS — R05 Cough: Secondary | ICD-10-CM | POA: Diagnosis not present

## 2016-10-05 DIAGNOSIS — I110 Hypertensive heart disease with heart failure: Secondary | ICD-10-CM | POA: Insufficient documentation

## 2016-10-05 DIAGNOSIS — I5032 Chronic diastolic (congestive) heart failure: Secondary | ICD-10-CM | POA: Insufficient documentation

## 2016-10-05 DIAGNOSIS — Z791 Long term (current) use of non-steroidal anti-inflammatories (NSAID): Secondary | ICD-10-CM | POA: Insufficient documentation

## 2016-10-05 DIAGNOSIS — R519 Headache, unspecified: Secondary | ICD-10-CM

## 2016-10-05 LAB — CBC
HCT: 40.5 % (ref 36.0–46.0)
Hemoglobin: 13.6 g/dL (ref 12.0–15.0)
MCH: 29.8 pg (ref 26.0–34.0)
MCHC: 33.6 g/dL (ref 30.0–36.0)
MCV: 88.6 fL (ref 78.0–100.0)
Platelets: 305 10*3/uL (ref 150–400)
RBC: 4.57 MIL/uL (ref 3.87–5.11)
RDW: 12.4 % (ref 11.5–15.5)
WBC: 9.3 10*3/uL (ref 4.0–10.5)

## 2016-10-05 LAB — URINALYSIS, ROUTINE W REFLEX MICROSCOPIC
Bacteria, UA: NONE SEEN
Bilirubin Urine: NEGATIVE
Glucose, UA: NEGATIVE mg/dL
Hgb urine dipstick: NEGATIVE
Ketones, ur: NEGATIVE mg/dL
Leukocytes, UA: NEGATIVE
Nitrite: NEGATIVE
Protein, ur: 100 mg/dL — AB
Specific Gravity, Urine: 1.011 (ref 1.005–1.030)
pH: 6 (ref 5.0–8.0)

## 2016-10-05 LAB — BASIC METABOLIC PANEL
Anion gap: 7 (ref 5–15)
BUN: 14 mg/dL (ref 6–20)
CO2: 26 mmol/L (ref 22–32)
Calcium: 9.1 mg/dL (ref 8.9–10.3)
Chloride: 101 mmol/L (ref 101–111)
Creatinine, Ser: 0.76 mg/dL (ref 0.44–1.00)
GFR calc Af Amer: >60 mL/min (ref >60)
GFR calc non Af Amer: >60 mL/min (ref >60)
Glucose, Bld: 97 mg/dL (ref 65–99)
Potassium: 2.9 mmol/L — ABNORMAL LOW (ref 3.5–5.1)
Sodium: 134 mmol/L — ABNORMAL LOW (ref 135–145)

## 2016-10-05 LAB — CBG MONITORING, ED: Glucose-Capillary: 100 mg/dL — ABNORMAL HIGH (ref 65–99)

## 2016-10-05 MED ORDER — DIPHENHYDRAMINE HCL 50 MG/ML IJ SOLN
12.5000 mg | Freq: Once | INTRAMUSCULAR | Status: AC
  Administered 2016-10-05: 12.5 mg via INTRAVENOUS
  Filled 2016-10-05: qty 1

## 2016-10-05 MED ORDER — PROCHLORPERAZINE EDISYLATE 5 MG/ML IJ SOLN
10.0000 mg | Freq: Once | INTRAMUSCULAR | Status: AC
  Administered 2016-10-05: 10 mg via INTRAVENOUS
  Filled 2016-10-05: qty 2

## 2016-10-05 NOTE — ED Provider Notes (Signed)
[place] DEPT Provider Note   CSN: [id number] Arrival date & time: 10/05/16  1458     History   Chief Complaint Chief Complaint  Patient presents with  . Weakness    HPI Megan Odom is a 71 y.o. female.  HPI Patient presents to the emergency room with a few complaints. She complains of having a headache that started several weeks ago. Headaches on the right side of her head behind her eye radiating towards the back of her head. The headache is moderate to severe in nature and waxes and wanes. Today his symptoms were more severe. She denies any sudden onset of the headache. She denies any focal numbness. She denies any focal weakness although does feel weak all over. She notices when she tries to walk she feels weak in the legs and gets fatigued.  She also had an ill defined episode the other night where she thinks she may have had a seizure.  She cannot tell me specifically if anyone witnessed that event of if she lost consciousness but states she felt funny and confused after that episode so she thinks she had a seizure.  She denies any trouble with fevers but has been coughing. She denies any specific chest pain.  She denies any difficulty with her speech.  Patient also has had trouble with her right shoulder. It increases to move her arms. She has fallen a few times recently. She thinks the falls are related to her feeling weak. She denies specifically injuring her shoulder with these falls however she continues to have right shoulder pain.  She also complains of swollen glands in her neck. That has been going on for a long period of time as well. She can't tell me when it specifically started but she does mention having seen an ENT doctor for this in the past although she does not know of a specific diagnosis Past Medical History:  Diagnosis Date  . Arthritis   . Asthma   . Depression   . Hep C w/o coma, chronic ([place])   . Hyperlipemia   . Hypertension   . Type 2  diabetes mellitus [facility])     Patient Active Problem List   Diagnosis Date Noted  . Intractable abdominal pain 03/25/2016  . Nausea vomiting and diarrhea 03/25/2016  . AKI (acute kidney injury) ([place]) 03/25/2016  . Depression with anxiety 03/25/2016  . Chronic diastolic (congestive) heart failure 03/25/2016  . Hyperglycemia 03/25/2016  . GERD (gastroesophageal reflux disease) 03/25/2016  . Hypokalemia 03/25/2016  . Hypocalcemia 03/25/2016  . Headache 11/20/2014  . Dizziness 11/20/2014  . Hepatic cirrhosis ([place]) 11/05/2014  . Chest pain 11/05/2014  . Dysphagia 11/05/2014  . History of hepatitis C 11/05/2014  . Sleep apnea, obstructive 01/17/2013  . Paroxysmal atrial tachycardia ([place]) 01/17/2013  . Essential hypertension 01/17/2013  . Morbid obesity ([place]) 01/17/2013  . Chest pain at rest 01/17/2013  . Difficulty in walking(719.7) 02/17/2012  . Bilateral leg weakness 02/17/2012  . Back pain 02/16/2012  . Bilateral anterior knee pain 02/16/2012  . Knee pain, left 02/16/2012    Past Surgical History:  Procedure Laterality Date  . ABDOMINAL HYSTERECTOMY    . APPENDECTOMY    . BREAST SURGERY    . CHOLECYSTECTOMY    . COLONOSCOPY N/A 07/03/2014   treated/removed as above    OB History    Gravida Para Term Preterm AB Living   4 2 2   2 2    SAB TAB Ectopic Multiple Live Births  2               Home Medications    Prior to Admission medications   Medication Sig Start Date End Date Taking? Authorizing Provider  albuterol (ACCUNEB) 0.63 MG/3ML nebulizer solution Take 3 mLs by nebulization every 6 (six) hours as needed for wheezing.    Historical Provider, MD  albuterol (PROVENTIL HFA;VENTOLIN HFA) 108 (90 Base) MCG/ACT inhaler Inhale 2 puffs into the lungs every 4 (four) hours as needed for wheezing or shortness of breath. 07/18/16   Noemi Chapel, MD  aspirin EC 81 MG tablet Take 81 mg by mouth daily.      Historical Provider, MD  beclomethasone (QVAR) 40 MCG/ACT inhaler  Inhale 1 puff into the lungs 2 (two) times daily as needed (shortness of breath).     Historical Provider, MD  ergocalciferol (VITAMIN D2) 50000 UNITS capsule Take 50,000 Units by mouth once a week. sunday    Historical Provider, MD  fluticasone (FLONASE) 50 MCG/ACT nasal spray Place 2 sprays into the nose daily as needed for allergies.     Historical Provider, MD  furosemide (LASIX) 40 MG tablet Take 40 mg by mouth daily.      Historical Provider, MD  gabapentin (NEURONTIN) 300 MG capsule Take 300 mg by mouth 3 (three) times daily.  06/24/16   Historical Provider, MD  ibuprofen (ADVIL,MOTRIN) 800 MG tablet Take 800-1,600 mg by mouth daily as needed for mild pain or moderate pain.  06/23/16   Historical Provider, MD  latanoprost (XALATAN) 0.005 % ophthalmic solution Place 1 drop into the right eye at bedtime.    Historical Provider, MD  loratadine (CLARITIN) 10 MG tablet Take 10 mg by mouth daily.     Historical Provider, MD  losartan-hydrochlorothiazide (HYZAAR) 100-25 MG per tablet Take 1 tablet by mouth daily.    Historical Provider, MD  Multiple Vitamin (MULTIVITAMIN WITH MINERALS) TABS tablet Take 1 tablet by mouth daily.    Historical Provider, MD  omeprazole (PRILOSEC) 40 MG capsule Take 40 mg by mouth 2 (two) times daily.     Historical Provider, MD  PARoxetine (PAXIL) 20 MG tablet Take 20 mg by mouth 2 (two) times daily.    Historical Provider, MD  Phenyleph-CPM-DM-Aspirin (ALKA-SELTZER PLUS COLD & COUGH PO) Take 2 capsules by mouth every 6 (six) hours as needed (cough/cold).    Historical Provider, MD  potassium chloride (K-DUR) 10 MEQ tablet Take 10 mEq by mouth daily.    Historical Provider, MD  predniSONE (DELTASONE) 20 MG tablet Take 2 tablets (40 mg total) by mouth daily. 07/18/16   Noemi Chapel, MD  Pseudoeph-Doxylamine-DM-APAP (DAYQUIL/NYQUIL COLD/FLU RELIEF PO) Take 2 capsules by mouth every 6 (six) hours as needed (cough/congestion).    Historical Provider, MD  verapamil (VERELAN PM)  180 MG 24 hr capsule Take 180 mg by mouth daily. 06/23/16   Historical Provider, MD    Family History Family History  Problem Relation Age of Onset  . Alcohol abuse Mother   . Early death Mother   . Early death Father   . Alcohol abuse Sister   . HIV Brother   . Diabetes Brother   . Heart disease Brother   . Hepatitis C Daughter   . Hypertension Son   . Heart disease Brother   . Diabetes Brother   . Cancer Maternal Aunt   . Hypertension Maternal Aunt   . Heart disease Maternal Aunt   . Cancer Maternal Uncle   . Hypertension Maternal Uncle  Social History Social History  Substance Use Topics  . Smoking status: Current Every Day Smoker    Packs/day: 0.50    Years: 50.00  . Smokeless tobacco: Never Used     Comment: one pack every 2-3 days  . Alcohol use No     Allergies   Darvocet [propoxyphene n-acetaminophen]; Latex; Other; Penicillins; Tetracyclines & related; and Adhesive [tape]   Review of Systems Review of Systems  Constitutional: Negative for fever.  Respiratory: Positive for cough. Negative for shortness of breath.   Gastrointestinal: Positive for diarrhea. Negative for abdominal pain.  Genitourinary: Negative for dysuria and urgency.  Musculoskeletal: Negative for back pain.  Neurological: Positive for headaches. Negative for dizziness, speech difficulty and numbness.  All other systems reviewed and are negative.    Physical Exam Updated Vital Signs BP 169/72   Pulse (!) 59   Temp 98.5 F (36.9 C) (Oral)   Resp 20   Ht 5\' 5"  (1.651 m)   Wt 105.2 kg   SpO2 97%   BMI 38.61 kg/m   Physical Exam  Constitutional: She is oriented to person, place, and time. She appears well-developed and well-nourished. No distress.  HENT:  Head: Normocephalic and atraumatic.  Right Ear: External ear normal.  Left Ear: External ear normal.  Mouth/Throat: Oropharynx is clear and moist.  submandibular lymphadenopathy bilaterally  Eyes: Right eye exhibits no  discharge. Left eye exhibits no discharge. No scleral icterus.  Artificial lens left eye  Neck: Neck supple. No tracheal deviation present.  Cardiovascular: Normal rate, regular rhythm and intact distal pulses.   Pulmonary/Chest: Effort normal and breath sounds normal. No stridor. No respiratory distress. She has no wheezes. She has no rales.  Abdominal: Soft. Bowel sounds are normal. She exhibits no distension. There is no tenderness. There is no rebound and no guarding.  Musculoskeletal: She exhibits no edema or tenderness.  Pain with range of motion right shoulder  Neurological: She is alert and oriented to person, place, and time. She has normal strength. No cranial nerve deficit (No facial droop, extraocular movements intact, tongue midline ) or sensory deficit. She exhibits normal muscle tone. She displays no seizure activity. Coordination normal.  No pronator drift bilateral upper extrem, able to hold both legs off bed for 5 seconds , sensation intact in all extremities, no visual field cuts, no left or right sided neglect, no nystagmus noted   Skin: Skin is warm and dry. No rash noted.  Psychiatric: She has a normal mood and affect.  Nursing note and vitals reviewed.    ED Treatments / Results  Labs (all labs ordered are listed, but only abnormal results are displayed) Labs Reviewed  BASIC METABOLIC PANEL - Abnormal; Notable for the following:       Result Value   Sodium 134 (*)    Potassium 2.9 (*)    All other components within normal limits  URINALYSIS, ROUTINE W REFLEX MICROSCOPIC - Abnormal; Notable for the following:    Protein, ur 100 (*)    All other components within normal limits  CBG MONITORING, ED - Abnormal; Notable for the following:    Glucose-Capillary 100 (*)    All other components within normal limits  CBC     Radiology Dg Chest 2 View  Result Date: 10/05/2016 CLINICAL DATA:  Cough EXAM: CHEST  2 VIEW COMPARISON:  07/18/2016 FINDINGS: Cardiac shadow  is near the upper limits of normal. Aortic calcifications are again seen. Lungs are well aerated bilaterally without focal infiltrate  or sizable effusion. No acute bony abnormality is seen. IMPRESSION: No active cardiopulmonary disease. Electronically Signed   By: Inez Catalina M.D.   On: 10/05/2016 16:08   Dg Shoulder Right  Result Date: 10/05/2016 CLINICAL DATA:  Pain for 1 week EXAM: RIGHT SHOULDER - 2+ VIEW COMPARISON:  Jan 08, 2015 FINDINGS: Frontal, Y scapular, and axillary images were obtained. There is no acute fracture or dislocation. There is relatively mild osteoarthritic change in the acromioclavicular joint. The glenohumeral joint appears unremarkable. No erosive change. Visualized right lung is clear. IMPRESSION: Persistent osteoarthritic change in the acromioclavicular joint, not felt to be appreciably changed. Glenohumeral joint appears unremarkable. No fracture or dislocation. Electronically Signed   By: Lowella Grip III M.D.   On: 10/05/2016 16:11   Ct Head Wo Contrast  Result Date: 10/05/2016 CLINICAL DATA:  Headache, generalized weakness EXAM: CT HEAD WITHOUT CONTRAST TECHNIQUE: Contiguous axial images were obtained from the base of the skull through the vertex without intravenous contrast. COMPARISON:  12/25/2013 FINDINGS: Brain: No acute intracranial abnormality. Specifically, no hemorrhage, hydrocephalus, mass lesion, acute infarction, or significant intracranial injury. Vascular: No hyperdense vessel or unexpected calcification. Skull: No acute calvarial abnormality. Sinuses/Orbits: Visualized paranasal sinuses and mastoids clear. Orbital soft tissues unremarkable. Other: None IMPRESSION: Normal study. Electronically Signed   By: Rolm Baptise M.D.   On: 10/05/2016 16:12    Procedures Procedures (including critical care time)  Medications Ordered in ED Medications  prochlorperazine (COMPAZINE) injection 10 mg (10 mg Intravenous Given 10/05/16 1535)  diphenhydrAMINE  (BENADRYL) injection 12.5 mg (12.5 mg Intravenous Given 10/05/16 1535)     Initial Impression / Assessment and Plan / ED Course  I have reviewed the triage vital signs and the nursing notes.  Pertinent labs & imaging results that were available during my care of the patient were reviewed by me and considered in my medical decision making (see chart for details).   No focal findings on exam to suggest stroke.  Doubt stroke or TIA. Suspect migraine type headache. Sx improved with treatment in the ED.  DIscussed her chronic shoulder problems.  Recommend orthopedic follow up  Final Clinical Impressions(s) / ED Diagnoses   Final diagnoses:  Acute nonintractable headache, unspecified headache type  Arthritis, shoulder region    New Prescriptions New Prescriptions   No medications on file     Dorie Rank, MD 10/05/16 1859

## 2016-10-05 NOTE — Discharge Instructions (Signed)
Follow-up with your primary care doctor next week. Consider seeing an orthopedic doctor regarding your shoulder, take over the counter medications as needed for pain

## 2016-10-05 NOTE — ED Notes (Signed)
Ambulatory to the bathroom without difficulty.  

## 2016-10-05 NOTE — ED Triage Notes (Signed)
Pt c/o ha x 1 week with increasing generalized weakness and feeling "off balance", heart fluttering and frequent falls.

## 2017-02-09 ENCOUNTER — Encounter (HOSPITAL_COMMUNITY): Payer: Self-pay | Admitting: Cardiology

## 2017-02-09 ENCOUNTER — Emergency Department (HOSPITAL_COMMUNITY)
Admission: EM | Admit: 2017-02-09 | Discharge: 2017-02-09 | Disposition: A | Discharge status: Home / Self Care | Payer: Medicare Other | Attending: Emergency Medicine | Admitting: Emergency Medicine

## 2017-02-09 DIAGNOSIS — Z7982 Long term (current) use of aspirin: Secondary | ICD-10-CM | POA: Insufficient documentation

## 2017-02-09 DIAGNOSIS — J45909 Unspecified asthma, uncomplicated: Secondary | ICD-10-CM | POA: Insufficient documentation

## 2017-02-09 DIAGNOSIS — Z5321 Procedure and treatment not carried out due to patient leaving prior to being seen by health care provider: Secondary | ICD-10-CM | POA: Diagnosis not present

## 2017-02-09 DIAGNOSIS — I10 Essential (primary) hypertension: Secondary | ICD-10-CM | POA: Insufficient documentation

## 2017-02-09 DIAGNOSIS — F17200 Nicotine dependence, unspecified, uncomplicated: Secondary | ICD-10-CM | POA: Insufficient documentation

## 2017-02-09 DIAGNOSIS — E119 Type 2 diabetes mellitus without complications: Secondary | ICD-10-CM | POA: Insufficient documentation

## 2017-02-09 NOTE — Congregational Nurse Program (Signed)
Congregational Nurse Program Note  Date of Encounter: 02/09/2017  Past Medical History: Past Medical History:  Diagnosis Date  . Arthritis   . Asthma   . Depression   . Hep C w/o coma, chronic ([place])   . Hyperlipemia   . Hypertension   . Type 2 diabetes mellitus ([place])     Encounter Details:     CNP Questionnaire - 02/09/17 1133      Patient Demographics   Is this a new or existing patient? New   Patient is considered a/an Not Applicable   Race African-American/Black     Patient Assistance   Location of Patient [place]   Patient's financial/insurance status Medicare   Uninsured Patient ([place] [employer]) No   Patient referred to apply for the following financial assistance Not Applicable   Food insecurities addressed Provided food supplies   Transportation assistance No   Assistance securing medications No   Educational health offerings Chronic disease;Other     Encounter Details   Primary purpose of visit Chronic Illness/Condition Visit   Was an Emergency Department visit averted? Not Applicable   Does patient have a medical provider? Yes   Patient referred to Emergency Department;Other   Was a mental health screening completed? (GAINS tool) No   Does patient have dental issues? No   Does patient have vision issues? No   Does your patient have an abnormal blood pressure today? Yes   Since previous encounter, have you referred patient for abnormal blood pressure that resulted in a new diagnosis or medication change? No   Does your patient have an abnormal blood glucose today? No   Since previous encounter, have you referred patient for abnormal blood glucose that resulted in a new diagnosis or medication change? No   Was there a life-saving intervention made? Yes      Client was seen for BP reading, c/o of symptoms night before of headache, some pain in shoulder, jaw, nausea. Client is on BP medication. Educated client on symptoms of heart  attack and instructed to report symptoms to her PCP. Encouraged to go to ER if symptoms occur or persists.  BP 164/84 P 55.  Client did not appear to be in any distress at the present.  Mariam Dollar, RN   [phone].

## 2017-02-09 NOTE — Patient Instructions (Signed)
Client requested to have BP taken, complaints of symptoms night before of headache, nausea, L. Shoulder pain and jaw pain.. BP taken-162/84 P-55.  Denies having these symptoms before, but instructed to call PCP to report symptoms. Educated client symptoms of heart attack and not to hesitate to go to ER if symptoms persists. Client denies symptoms at present.  Client is on medication for medication.  Mariam Dollar, RN  [phone].

## 2017-02-09 NOTE — ED Triage Notes (Signed)
Called name.  Per registration pt left

## 2017-02-09 NOTE — ED Notes (Signed)
Continues to c/o headache.

## 2017-02-09 NOTE — ED Triage Notes (Signed)
C/o headache and hypertension 199/100 this morning.

## 2017-03-21 ENCOUNTER — Encounter (HOSPITAL_COMMUNITY): Payer: Self-pay | Admitting: Emergency Medicine

## 2017-03-21 ENCOUNTER — Emergency Department (HOSPITAL_COMMUNITY): Payer: Medicare Other

## 2017-03-21 ENCOUNTER — Emergency Department (HOSPITAL_COMMUNITY)
Admission: EM | Admit: 2017-03-21 | Discharge: 2017-03-21 | Disposition: A | Discharge status: Home / Self Care | Payer: Medicare Other | Attending: Emergency Medicine | Admitting: Emergency Medicine

## 2017-03-21 DIAGNOSIS — Z79899 Other long term (current) drug therapy: Secondary | ICD-10-CM | POA: Diagnosis not present

## 2017-03-21 DIAGNOSIS — M25562 Pain in left knee: Secondary | ICD-10-CM | POA: Diagnosis not present

## 2017-03-21 DIAGNOSIS — Z91040 Latex allergy status: Secondary | ICD-10-CM | POA: Diagnosis not present

## 2017-03-21 DIAGNOSIS — I10 Essential (primary) hypertension: Secondary | ICD-10-CM | POA: Insufficient documentation

## 2017-03-21 DIAGNOSIS — M1712 Unilateral primary osteoarthritis, left knee: Secondary | ICD-10-CM

## 2017-03-21 DIAGNOSIS — E119 Type 2 diabetes mellitus without complications: Secondary | ICD-10-CM | POA: Insufficient documentation

## 2017-03-21 DIAGNOSIS — Z7982 Long term (current) use of aspirin: Secondary | ICD-10-CM | POA: Diagnosis not present

## 2017-03-21 DIAGNOSIS — F17210 Nicotine dependence, cigarettes, uncomplicated: Secondary | ICD-10-CM | POA: Insufficient documentation

## 2017-03-21 DIAGNOSIS — J45909 Unspecified asthma, uncomplicated: Secondary | ICD-10-CM | POA: Diagnosis not present

## 2017-03-21 MED ORDER — TRAMADOL HCL 50 MG PO TABS
100.0000 mg | ORAL_TABLET | Freq: Once | ORAL | Status: AC
  Administered 2017-03-21: 100 mg via ORAL
  Filled 2017-03-21: qty 2

## 2017-03-21 MED ORDER — DEXAMETHASONE SODIUM PHOSPHATE 10 MG/ML IJ SOLN
10.0000 mg | Freq: Once | INTRAMUSCULAR | Status: AC
  Administered 2017-03-21: 10 mg via INTRAMUSCULAR
  Filled 2017-03-21: qty 1

## 2017-03-21 MED ORDER — ONDANSETRON HCL 4 MG PO TABS
4.0000 mg | ORAL_TABLET | Freq: Once | ORAL | Status: AC
  Administered 2017-03-21: 4 mg via ORAL
  Filled 2017-03-21: qty 1

## 2017-03-21 MED ORDER — DICLOFENAC SODIUM 75 MG PO TBEC: 75.0000 mg | DELAYED_RELEASE_TABLET | Freq: Two times a day (BID) | ORAL | 0 refills | Status: DC

## 2017-03-21 MED ORDER — DEXAMETHASONE 4 MG PO TABS: 4.0000 mg | ORAL_TABLET | Freq: Two times a day (BID) | ORAL | 0 refills | Status: DC

## 2017-03-21 MED ORDER — IBUPROFEN 400 MG PO TABS
400.0000 mg | ORAL_TABLET | Freq: Once | ORAL | Status: AC
  Administered 2017-03-21: 400 mg via ORAL
  Filled 2017-03-21: qty 1

## 2017-03-21 NOTE — ED Provider Notes (Signed)
[facility] DEPT Provider Note   CSN: [id number] Arrival date & time: 03/21/17  1927     History   Chief Complaint Chief Complaint  Patient presents with  . Knee Pain    HPI Megan Odom is a 71 y.o. female.  Patient is a 71 year old female who presents to the emergency department with complaint of left knee pain.  The patient states she has had problems with her knees for several months. 3 or 4 days ago she states she was walking up steps, she heard a pop, and she's been unable to walk without pain involving her knee since that time. She has tried over-the-counter rubs limits. She has tried isopropyl alcohol, and none of these seem to be helping. She denies any recent injury or trauma to the knee. 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 She gets some relief with rest, but it does not completely resolve the issue.      Past Medical History:  Diagnosis Date  . Arthritis   . Asthma   . Depression   . Hep C w/o coma, chronic ([place])   . Hyperlipemia   . Hypertension   . Type 2 diabetes mellitus [facility])     Patient Active Problem List   Diagnosis Date Noted  . Intractable abdominal pain 03/25/2016  . Nausea vomiting and diarrhea 03/25/2016  . AKI (acute kidney injury) ([place]) 03/25/2016  . Depression with anxiety 03/25/2016  . Chronic diastolic (congestive) heart failure ([place]) 03/25/2016  . Hyperglycemia 03/25/2016  . GERD (gastroesophageal reflux disease) 03/25/2016  . Hypokalemia 03/25/2016  . Hypocalcemia 03/25/2016  . Headache 11/20/2014  . Dizziness 11/20/2014  . Hepatic cirrhosis ([place]) 11/05/2014  . Chest pain 11/05/2014  . Dysphagia 11/05/2014  . History of hepatitis C 11/05/2014  . Sleep apnea, obstructive 01/17/2013  . Paroxysmal atrial tachycardia ([place]) 01/17/2013  . Essential hypertension 01/17/2013  . Morbid obesity ([place]) 01/17/2013  . Chest pain at rest  01/17/2013  . Difficulty in walking(719.7) 02/17/2012  . Bilateral leg weakness 02/17/2012  . Back pain 02/16/2012  . Bilateral anterior knee pain 02/16/2012  . Knee pain, left 02/16/2012    Past Surgical History:  Procedure Laterality Date  . ABDOMINAL HYSTERECTOMY    . APPENDECTOMY    . BREAST SURGERY    . CHOLECYSTECTOMY    . COLONOSCOPY N/A 07/03/2014   treated/removed as above    OB History    Gravida Para Term Preterm AB Living   4 2 2   2 2    SAB TAB Ectopic Multiple Live Births   2               Home Medications    Prior to Admission medications   Medication Sig Start Date End Date Taking? Authorizing Provider  albuterol (ACCUNEB) 0.63 MG/3ML nebulizer solution Take 3 mLs by nebulization every 6 (six) hours as needed for wheezing.    [provider]  albuterol (PROVENTIL HFA;VENTOLIN HFA) 108 (90 Base) MCG/ACT inhaler Inhale 2 puffs into the lungs every 4 (four) hours as needed for wheezing or shortness of breath. 07/18/16   Noemi Chapel, MD  aspirin EC 81 MG tablet Take 81 mg by mouth daily.      [provider]  beclomethasone (QVAR) 40 MCG/ACT inhaler Inhale 1 puff into the lungs 2 (two) times daily as needed (shortness of breath).     [provider]  dexamethasone (DECADRON) 4 MG tablet Take 1 tablet (4 mg total) by mouth 2 (two) times daily with a meal. 03/21/17   Lily Kocher, PA-C  diclofenac (VOLTAREN) 75 MG EC tablet Take 1 tablet (75 mg total) by mouth 2 (two) times daily. 03/21/17   Lily Kocher, PA-C  ergocalciferol (VITAMIN D2) 50000 UNITS capsule Take 50,000 Units by mouth once a week. sunday    [provider]  fluticasone (FLONASE) 50 MCG/ACT nasal spray Place 2 sprays into the nose daily as needed for allergies.     [provider]  furosemide (LASIX) 40 MG tablet Take 40 mg by mouth daily.      [provider]  gabapentin (NEURONTIN) 300 MG capsule Take 300 mg by mouth 3 (three) times daily.   06/24/16   [provider]  ibuprofen (ADVIL,MOTRIN) 800 MG tablet Take 800-1,600 mg by mouth daily as needed for mild pain or moderate pain.  06/23/16   [provider]  latanoprost (XALATAN) 0.005 % ophthalmic solution Place 1 drop into the right eye at bedtime.    [provider]  loratadine (CLARITIN) 10 MG tablet Take 10 mg by mouth daily.     [provider]  losartan-hydrochlorothiazide (HYZAAR) 100-25 MG per tablet Take 1 tablet by mouth daily.    [provider]  Multiple Vitamin (MULTIVITAMIN WITH MINERALS) TABS tablet Take 1 tablet by mouth daily.    [provider]  omeprazole (PRILOSEC) 40 MG capsule Take 40 mg by mouth 2 (two) times daily.     [provider]  PARoxetine (PAXIL) 20 MG tablet Take 20 mg by mouth 2 (two) times daily.    [provider]  Phenyleph-CPM-DM-Aspirin (ALKA-SELTZER PLUS COLD & COUGH PO) Take 2 capsules by mouth every 6 (six) hours as needed (cough/cold).    [provider]  potassium chloride (K-DUR) 10 MEQ tablet Take 10 mEq by mouth daily.    [provider]  predniSONE (DELTASONE) 20 MG tablet Take 2 tablets (40 mg total) by mouth daily. 07/18/16   Noemi Chapel, MD  Pseudoeph-Doxylamine-DM-APAP (DAYQUIL/NYQUIL COLD/FLU RELIEF PO) Take 2 capsules by mouth every 6 (six) hours as needed (cough/congestion).    [provider]  verapamil (VERELAN PM) 180 MG 24 hr capsule Take 180 mg by mouth daily. 06/23/16   [provider]    Family History Family History  Problem Relation Age of Onset  . Alcohol abuse Mother   . Early death Mother   . Early death Father   . Alcohol abuse Sister   . HIV Brother   . Diabetes Brother   . Heart disease Brother   . Hepatitis C Daughter   . Hypertension Son   . Heart disease Brother   . Diabetes Brother   . Cancer Maternal Aunt   . Hypertension Maternal Aunt   . Heart disease Maternal Aunt   . Cancer Maternal  Uncle   . Hypertension Maternal Uncle     Social History Social History  Substance Use Topics  . Smoking status: Current Every Day Smoker    Packs/day: 0.50    Years: 50.00  . Smokeless tobacco: Never Used     Comment: one pack every 2-3 days  . Alcohol use No     Allergies   Darvocet [propoxyphene n-acetaminophen]; Latex; Other; Penicillins; Tetracyclines & related; and Adhesive [tape]   Review of Systems Review of Systems  Constitutional: Negative for activity change.       All ROS  Neg except as noted in HPI  HENT: Negative for nosebleeds.   Eyes: Negative for photophobia and discharge.  Respiratory: Negative for cough, shortness of breath and wheezing.   Cardiovascular: Negative for chest pain and palpitations.  Gastrointestinal: Negative for abdominal pain and blood in stool.  Genitourinary: Negative for dysuria, frequency and hematuria.  Musculoskeletal: Positive for arthralgias. Negative for back pain and neck pain.  Skin: Negative.   Neurological: Negative for dizziness, seizures and speech difficulty.  Psychiatric/Behavioral: Negative for confusion and hallucinations.     Physical Exam Updated Vital Signs BP (!) 164/75 (BP Location: Right Arm)   Pulse (!) 55   Temp 98.1 F (36.7 C) (Oral)   Resp 18   Ht 5' 5.5" (1.664 m)   Wt 104.3 kg (230 lb)   SpO2 100%   BMI 37.69 kg/m   Physical Exam  Constitutional: She is oriented to person, place, and time. She appears well-developed and well-nourished.  Non-toxic appearance.  HENT:  Head: Normocephalic.  Right Ear: Tympanic membrane and external ear normal.  Left Ear: Tympanic membrane and external ear normal.  Eyes: Pupils are equal, round, and reactive to light. EOM and lids are normal.  Neck: Normal range of motion. Neck supple. Carotid bruit is not present.  Cardiovascular: Normal rate, regular rhythm, normal heart sounds, intact distal pulses and normal pulses.   Pulmonary/Chest: Breath sounds normal. No  respiratory distress.  Abdominal: Soft. Bowel sounds are normal. There is no tenderness. There is no guarding.  Musculoskeletal: She exhibits tenderness.  There is good range of motion of the left hip. There is pain with attempted flexion and/or extension of the left knee. There is puffiness of the knee, but no palpable effusion. There is crepitus present. There is no posterior mass appreciated. The knee is warm, but not hot. There is no deformity of the tibia or fibula area. There is some soreness of the left ankle, but no swelling. The dorsalis pedis and posterior tibial pulses are 2+ on the left. Capillary refill is less than 2 seconds.  Lymphadenopathy:       Head (right side): No submandibular adenopathy present.       Head (left side): No submandibular adenopathy present.    She has no cervical adenopathy.  Neurological: She is alert and oriented to person, place, and time. She has normal strength. No cranial nerve deficit or sensory deficit. Coordination normal.  Skin: Skin is warm and dry.  Psychiatric: She has a normal mood and affect. Her speech is normal.  Nursing note and vitals reviewed.    ED Treatments / Results  Labs (all labs ordered are listed, but only abnormal results are displayed) Labs Reviewed - No data to display  EKG  EKG Interpretation None       Radiology Dg Knee Complete 4 Views Left  Result Date: 03/21/2017 CLINICAL DATA:  Knee pain EXAM: LEFT KNEE - COMPLETE 4+ VIEW COMPARISON:  None. FINDINGS: Normal alignment no fracture. Joint space is normal. No effusion. Mild calcification the medial collateral ligament due to chronic injury. Deformity the proximal fibula due to chronic fracture which has healed. IMPRESSION: No acute abnormality. Electronically Signed   By: Franchot Gallo M.D.   On: 03/21/2017 20:29    Procedures Procedures (including critical care time)  Medications Ordered in ED Medications  traMADol (ULTRAM) tablet 100 mg (not administered)    ondansetron (ZOFRAN) tablet 4 mg (not administered)  dexamethasone (DECADRON) injection 10 mg (not administered)  ibuprofen (ADVIL,MOTRIN) tablet 400  mg (not administered)     Initial Impression / Assessment and Plan / ED Course  I have reviewed the triage vital signs and the nursing notes.  Pertinent labs & imaging results that were available during my care of the patient were reviewed by me and considered in my medical decision making (see chart for details).       Final Clinical Impressions(s) / ED Diagnoses MDM Vital signs are within normal limits with exception of the blood pressure being elevated at 164/75.  X-ray of the left knee shows calcification of the medial collateral ligament felt to be related to chronic injury. There are degenerative joint disease changes present. There is some deformity of the proximal fibula due to chronic fracture.  I discussed the findings of the x-ray as well as the findings on the examination with the patient in terms which he understands. The patient is fitted with a knee immobilizer. She was treated in the emergency department with medication for pain, anti-inflammatory medication, and steroids.  Prescription for diclofenac and Decadron given to the patient. The patient is asked to use a heating pad also to her knee, and to see her [place] tablet orthopedic specialist this week. Patient is in agreement with this plan.    Final diagnoses:  Primary osteoarthritis of left knee  Acute pain of left knee    New Prescriptions New Prescriptions   DEXAMETHASONE (DECADRON) 4 MG TABLET    Take 1 tablet (4 mg total) by mouth 2 (two) times daily with a meal.   DICLOFENAC (VOLTAREN) 75 MG EC TABLET    Take 1 tablet (75 mg total) by mouth 2 (two) times daily.     Lily Kocher, PA-C 03/21/17 2052    Milton Ferguson, MD 03/21/17 [phone]

## 2017-03-21 NOTE — Discharge Instructions (Signed)
Please call your orthopedic MD tomorrow for office appointment concerning your knee as soon as possible. Use your knee immobilizer when up and about until you're seen by the orthopedic specialist. Please use Decadron and diclofenac 2 times daily with food. Heating pad to your knee may be helpful.

## 2017-03-21 NOTE — ED Triage Notes (Signed)
Pt c/o left anterior knee pain for months but has gotten worse over the last couple of days.

## 2017-04-12 IMAGING — DX DG CHEST 2V
2 series · 2 of 2 positions shown · non-contrast
Comparison: CT chest 09/02/2015.  Chest x-ray 08/05/2015

CLINICAL DATA: Productive cough and congestion with diarrhea for 5
days.

EXAM:
CHEST  2 VIEW

[chest pa]
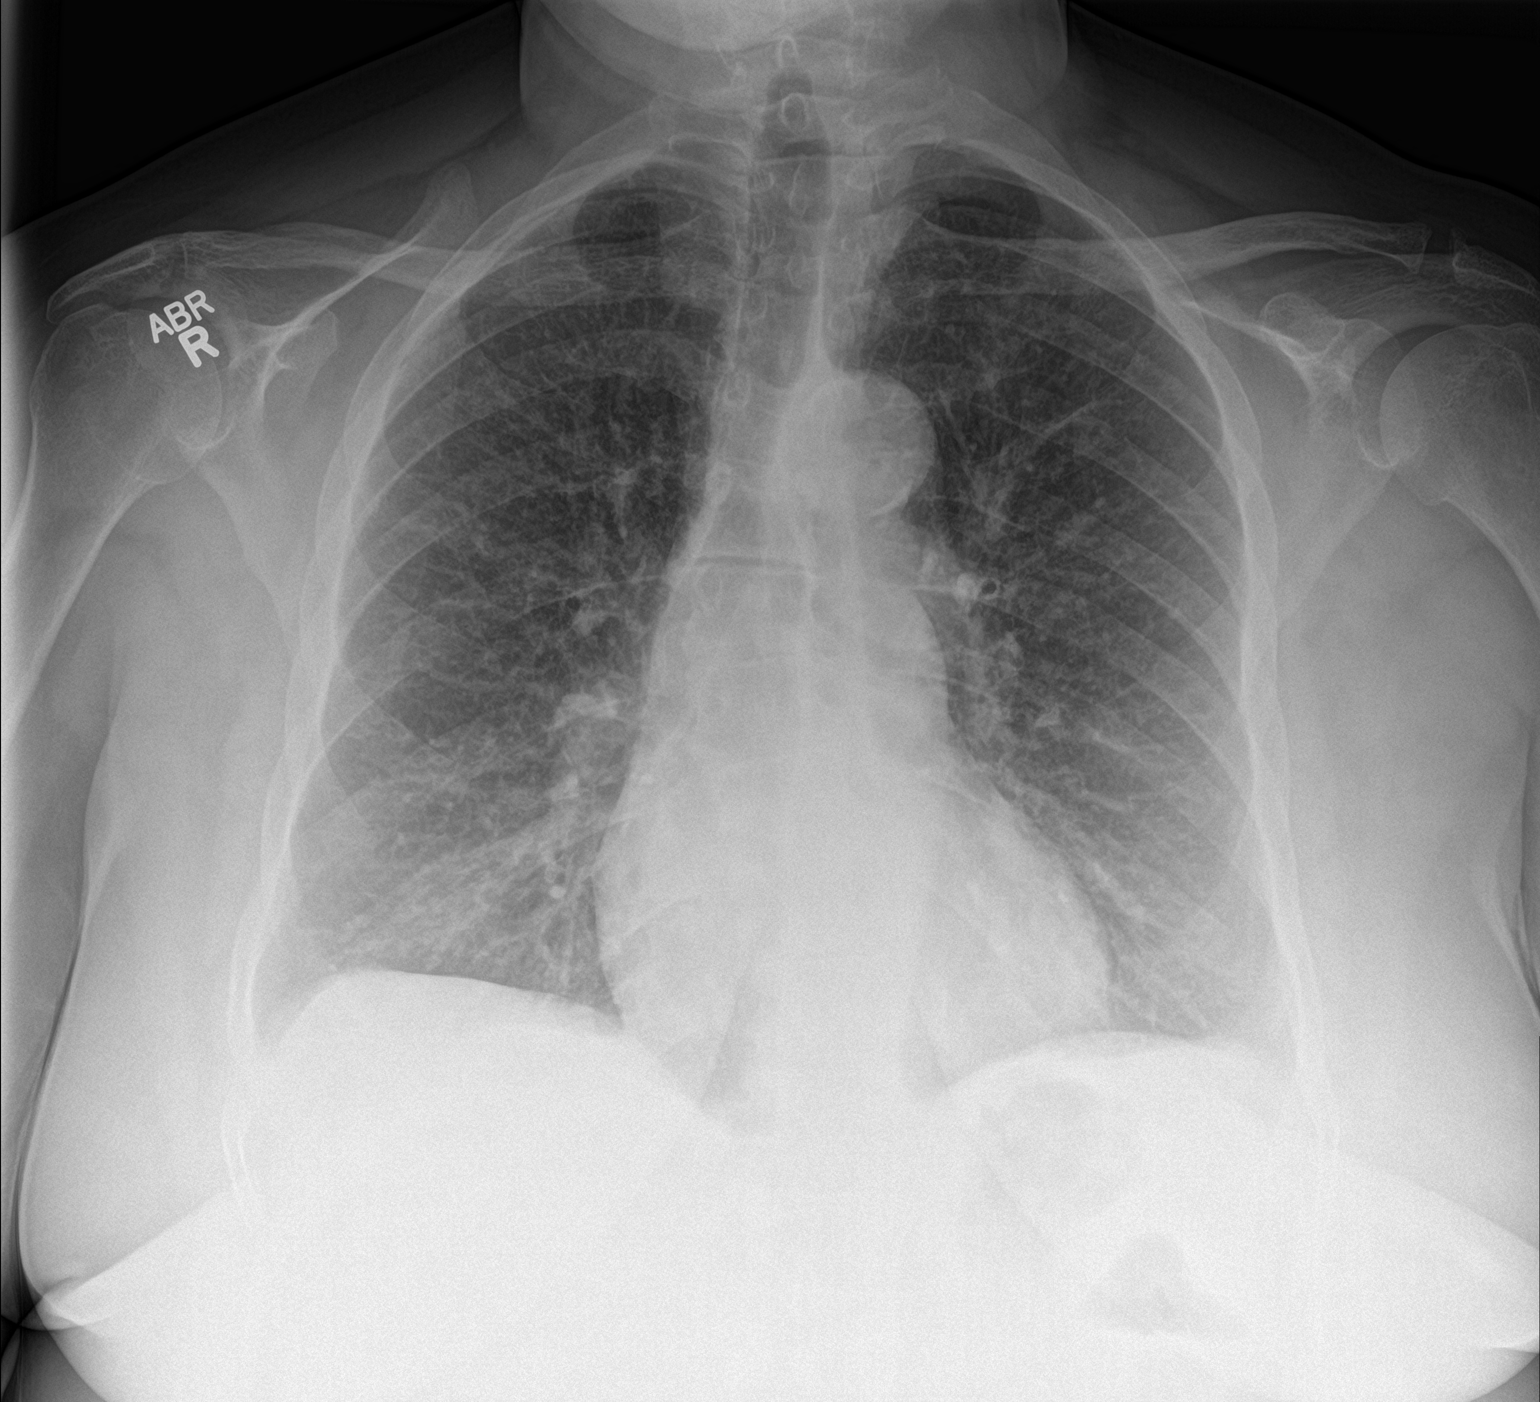

[chest lat]
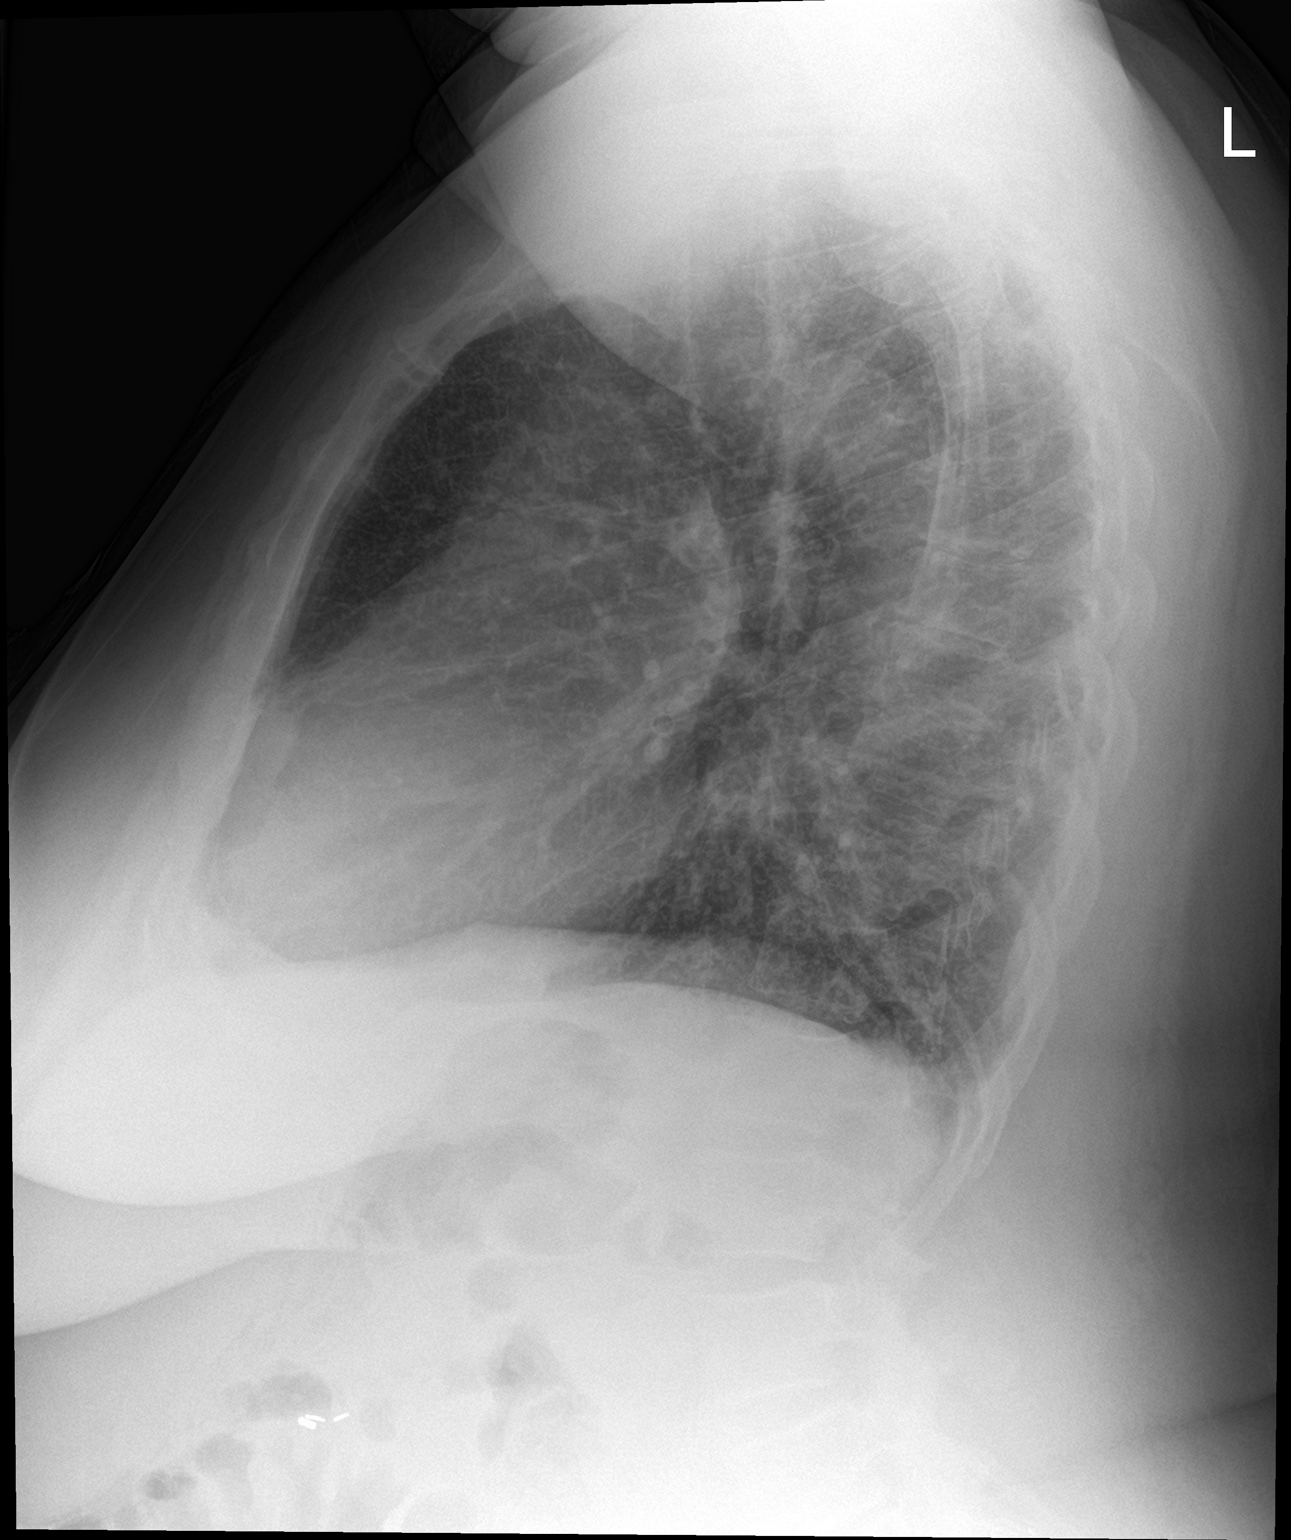

[2 of 2 positions shown; findings below may reference images not displayed]

FINDINGS: The lungs are clear wiithout focal pneumonia, edema, pneumothorax or
pleural effusion. Interstitial markings are diffusely coarsened with
chronic features. The cardiopericardial silhouette is within normal
limits for size. The visualized bony structures of the thorax are
intact.
IMPRESSION: Stable exam. Chronic interstitial changes without acute
cardiopulmonary process.

## 2017-04-13 IMAGING — DX DG CHEST 2V
2 series · 2 of 2 positions shown · non-contrast
Comparison: 11/04/2015

CLINICAL DATA: Cough

EXAM:
CHEST  2 VIEW

[chest lat]
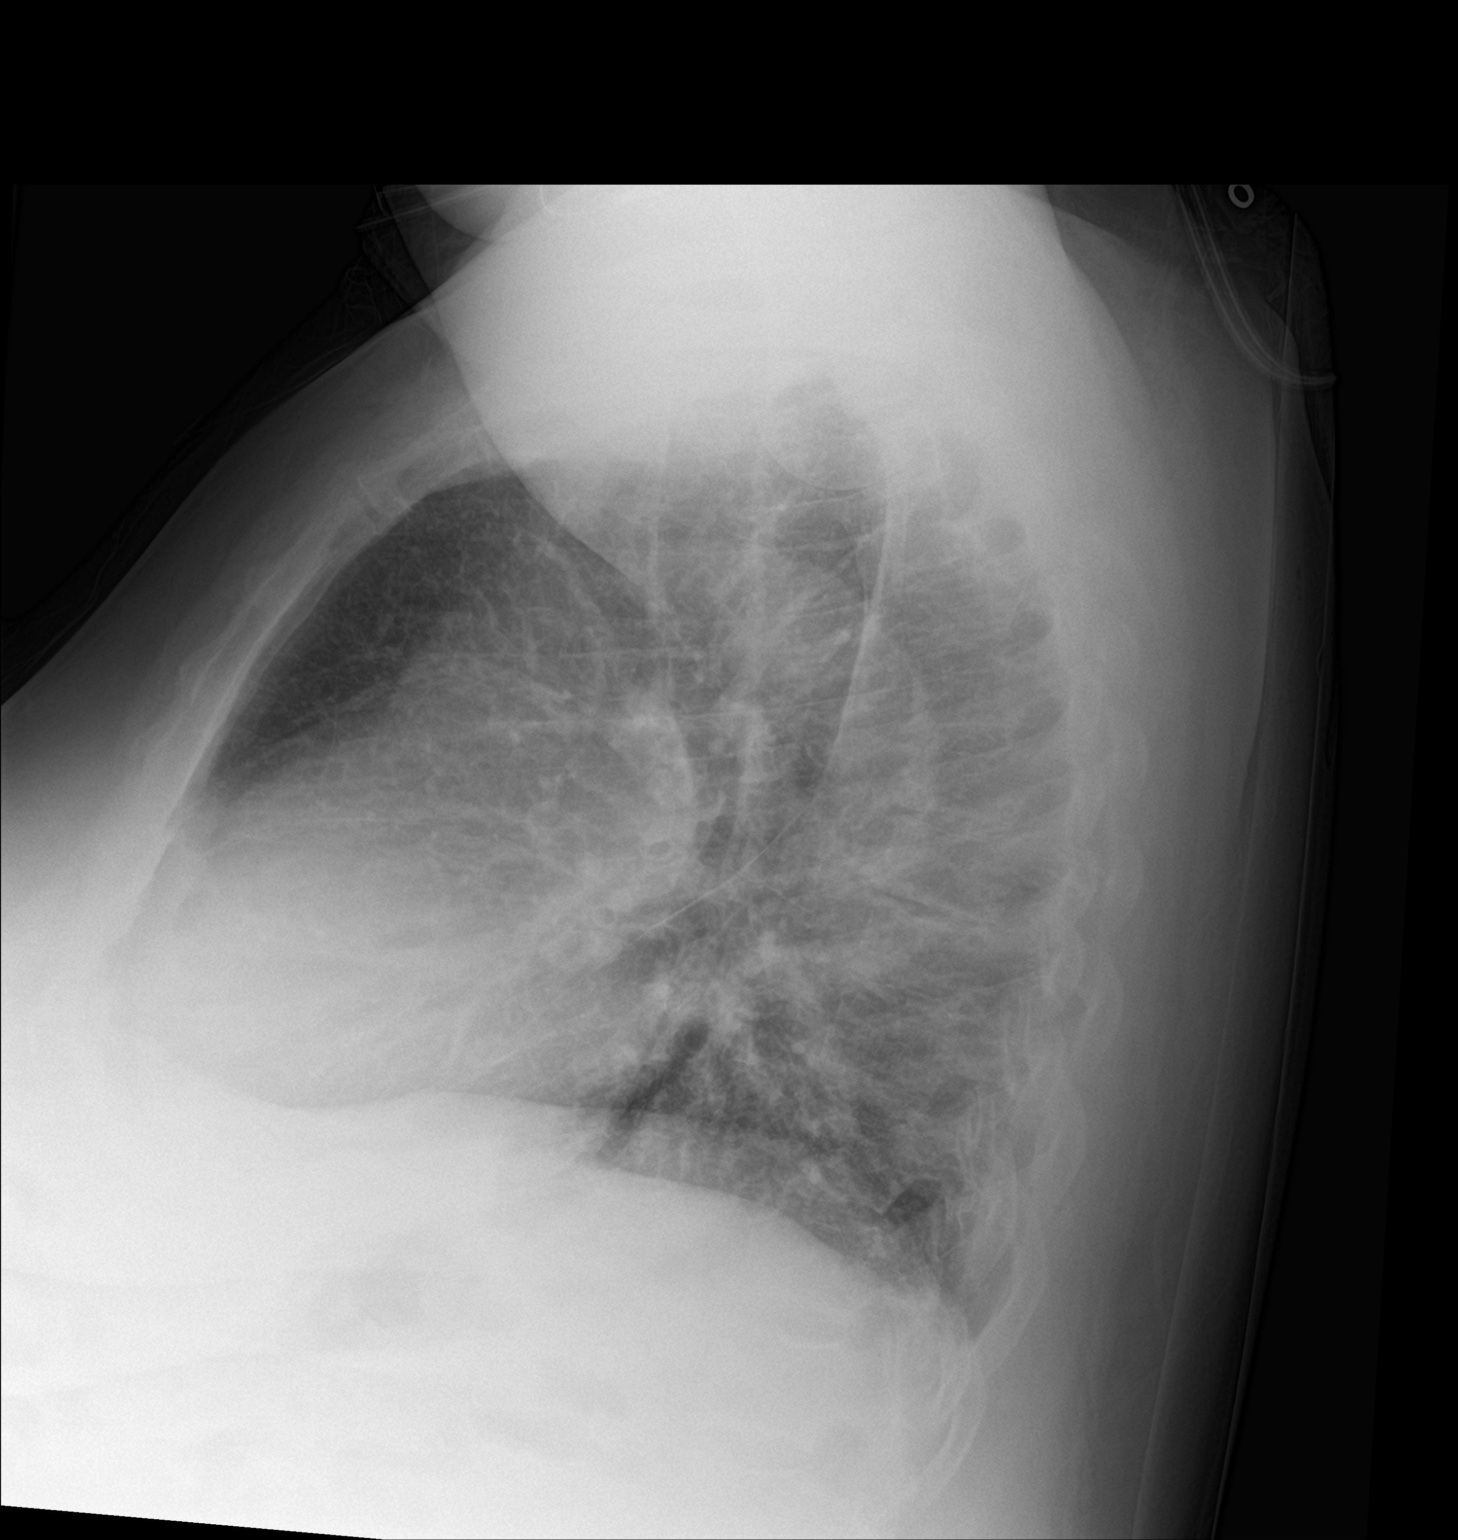

[chest ap]
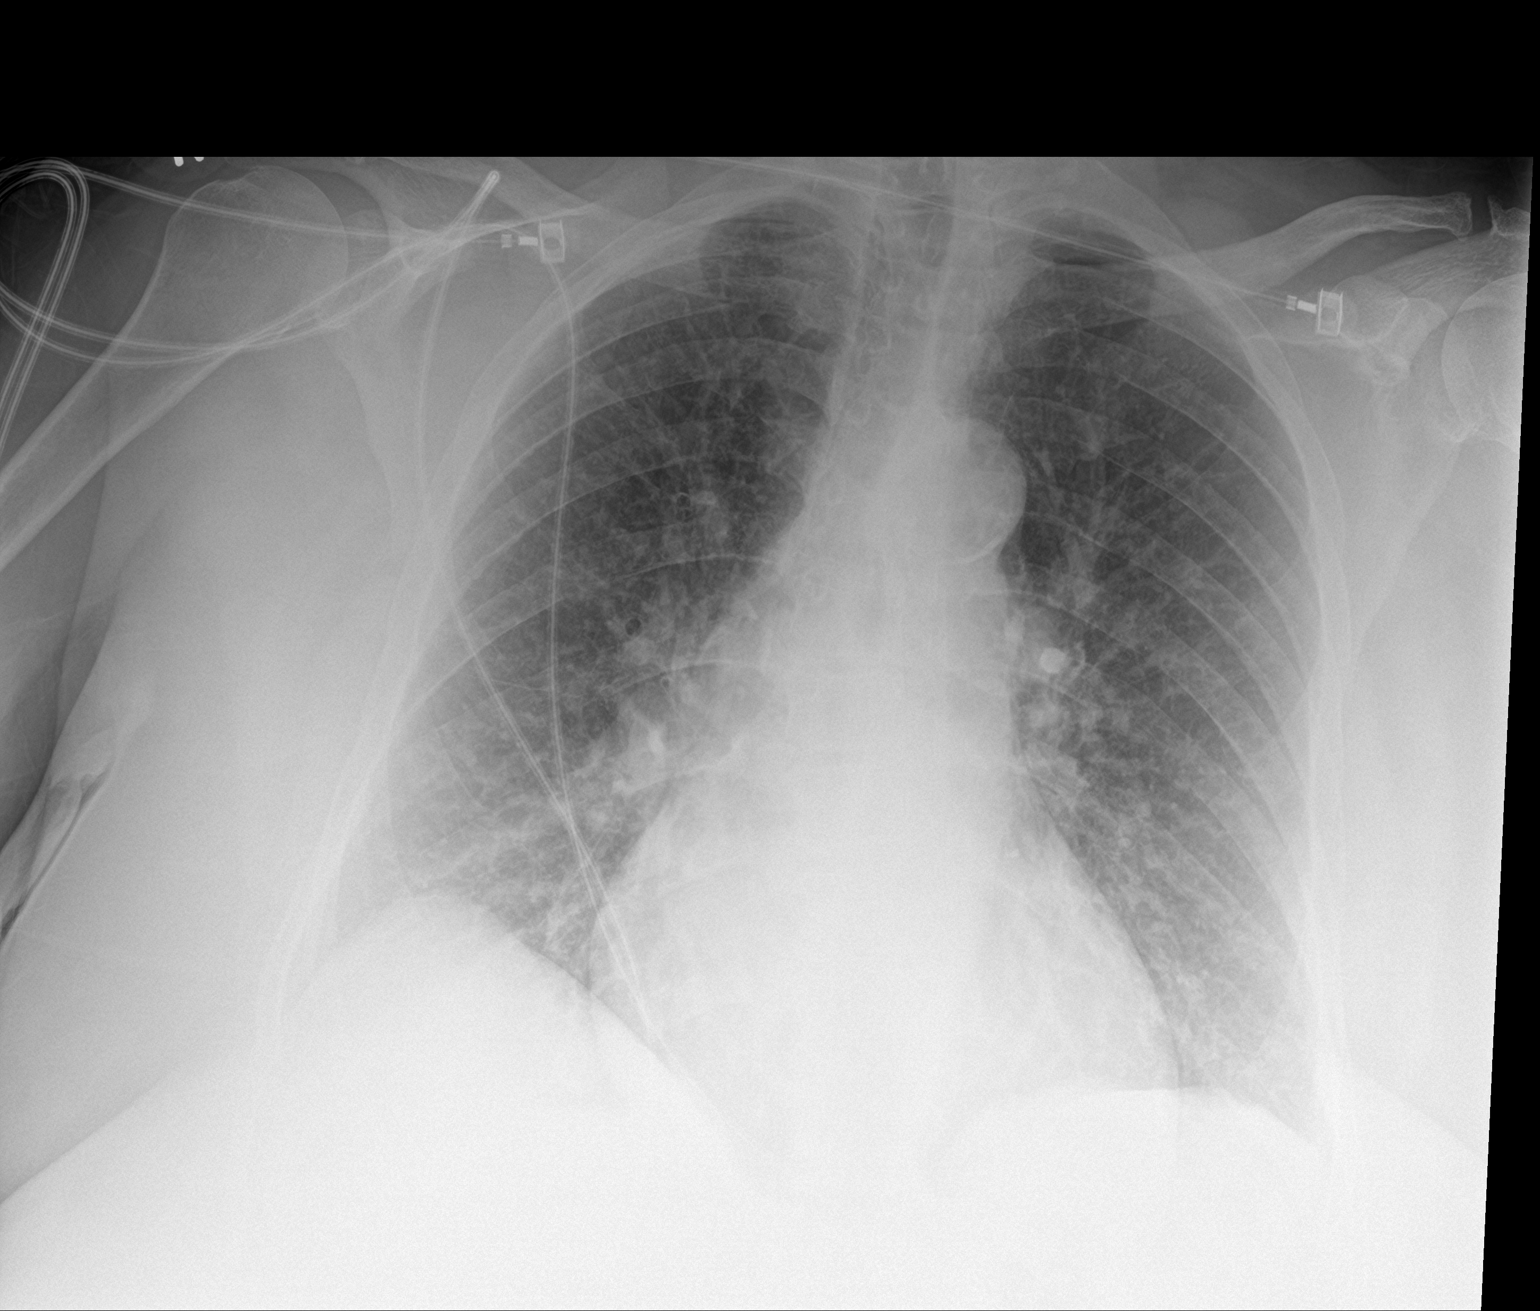

[2 of 2 positions shown; findings below may reference images not displayed]

FINDINGS: Cardiac enlargement with mild vascular congestion which has
progressed in the interval. Findings suggest mild fluid overload.
Possible mild interstitial edema. No effusion. Negative for
pneumonia. Mild right lower lobe atelectasis.
IMPRESSION: Vascular congestion with early interstitial edema.

Mild right lower lobe atelectasis.

## 2017-05-29 ENCOUNTER — Encounter: Payer: Self-pay | Admitting: Internal Medicine

## 2017-07-07 DIAGNOSIS — M170 Bilateral primary osteoarthritis of knee: Secondary | ICD-10-CM | POA: Diagnosis not present

## 2017-07-07 DIAGNOSIS — Z23 Encounter for immunization: Secondary | ICD-10-CM | POA: Diagnosis not present

## 2017-07-07 DIAGNOSIS — I10 Essential (primary) hypertension: Secondary | ICD-10-CM | POA: Diagnosis not present

## 2017-09-01 NOTE — Therapy (Signed)
[facility] [place], [place], [address] Phone: [phone]   Fax:  [phone]  Patient Details  Name: Megan Odom MRN: [id number] Date of Birth: 1946/02/29 Referring Provider:  Rosita Fire, MD  Encounter Date: 05/27/2015   OCCUPATIONAL THERAPY DISCHARGE SUMMARY  Pt did not return since last visit on 05/06/15, functional status unknown.   Plan: Patient agrees to discharge.  Patient goals were not met. Patient is being discharged due to not returning since the last visit.  ?????       Guadelupe Sabin, OTR/L  [facility] [address] [place], [place], [address] Phone: [phone]   Fax:  [phone]

## 2017-12-27 NOTE — Congregational Nurse Program (Signed)
Congregational Nurse Program Note  Date of Encounter: 12/27/2017  Past Medical History: Past Medical History:  Diagnosis Date  . Arthritis   . Asthma   . Depression   . Hep C w/o coma, chronic ([place])   . Hyperlipemia   . Hypertension   . Type 2 diabetes mellitus [facility])     Encounter Details: CNP Questionnaire - 12/27/17 1042      Questionnaire   Patient Status  Not Applicable    Race  Black or African American    Location Patient Served At  [employer], [place]  No food insecurities    Housing/Utilities  Yes, have permanent housing    Transportation  No transportation needs    Interpersonal Safety  Yes, feel physically and emotionally safe where you currently live    Medication  No medication insecurities    Medical Provider  Yes    Referrals  Not Applicable    ED Visit Averted  Not Applicable    Life-Saving Intervention Made  Not Applicable     Seen at the [place] [address]; P [place], [place] [employer] [phone]

## 2018-02-08 ENCOUNTER — Other Ambulatory Visit (HOSPITAL_COMMUNITY): Payer: Self-pay | Admitting: Internal Medicine

## 2018-02-08 DIAGNOSIS — Z1231 Encounter for screening mammogram for malignant neoplasm of breast: Secondary | ICD-10-CM

## 2018-02-08 DIAGNOSIS — R739 Hyperglycemia, unspecified: Secondary | ICD-10-CM | POA: Diagnosis not present

## 2018-02-08 DIAGNOSIS — J449 Chronic obstructive pulmonary disease, unspecified: Secondary | ICD-10-CM | POA: Diagnosis not present

## 2018-02-08 DIAGNOSIS — M170 Bilateral primary osteoarthritis of knee: Secondary | ICD-10-CM | POA: Diagnosis not present

## 2018-02-08 DIAGNOSIS — R7989 Other specified abnormal findings of blood chemistry: Secondary | ICD-10-CM | POA: Diagnosis not present

## 2018-02-08 DIAGNOSIS — E785 Hyperlipidemia, unspecified: Secondary | ICD-10-CM | POA: Diagnosis not present

## 2018-02-08 DIAGNOSIS — I10 Essential (primary) hypertension: Secondary | ICD-10-CM | POA: Diagnosis not present

## 2018-02-08 DIAGNOSIS — Z780 Asymptomatic menopausal state: Secondary | ICD-10-CM

## 2018-02-12 ENCOUNTER — Encounter: Payer: Self-pay | Admitting: *Deleted

## 2018-02-19 ENCOUNTER — Ambulatory Visit (HOSPITAL_COMMUNITY)
Admission: RE | Admit: 2018-02-19 | Discharge: 2018-02-19 | Disposition: A | Discharge status: Home / Self Care | Payer: Medicare HMO | Source: Ambulatory Visit | Attending: Internal Medicine | Admitting: Internal Medicine

## 2018-02-19 ENCOUNTER — Other Ambulatory Visit (HOSPITAL_COMMUNITY): Payer: Self-pay | Admitting: Internal Medicine

## 2018-02-19 DIAGNOSIS — Z780 Asymptomatic menopausal state: Secondary | ICD-10-CM | POA: Insufficient documentation

## 2018-02-19 DIAGNOSIS — Z1231 Encounter for screening mammogram for malignant neoplasm of breast: Secondary | ICD-10-CM | POA: Diagnosis not present

## 2018-02-19 DIAGNOSIS — M8589 Other specified disorders of bone density and structure, multiple sites: Secondary | ICD-10-CM | POA: Diagnosis not present

## 2018-02-20 DIAGNOSIS — B351 Tinea unguium: Secondary | ICD-10-CM | POA: Diagnosis not present

## 2018-02-20 DIAGNOSIS — B353 Tinea pedis: Secondary | ICD-10-CM | POA: Diagnosis not present

## 2018-04-25 ENCOUNTER — Encounter: Payer: Medicare Other | Admitting: Obstetrics and Gynecology

## 2018-05-11 ENCOUNTER — Encounter (HOSPITAL_COMMUNITY): Payer: Self-pay | Admitting: *Deleted

## 2018-05-11 ENCOUNTER — Emergency Department (HOSPITAL_COMMUNITY): Payer: Medicare HMO

## 2018-05-11 ENCOUNTER — Emergency Department (HOSPITAL_COMMUNITY)
Admission: EM | Admit: 2018-05-11 | Discharge: 2018-05-11 | Disposition: A | Discharge status: Home / Self Care | Payer: Medicare HMO | Attending: Emergency Medicine | Admitting: Emergency Medicine

## 2018-05-11 ENCOUNTER — Other Ambulatory Visit: Payer: Self-pay

## 2018-05-11 DIAGNOSIS — B349 Viral infection, unspecified: Secondary | ICD-10-CM | POA: Diagnosis not present

## 2018-05-11 DIAGNOSIS — E119 Type 2 diabetes mellitus without complications: Secondary | ICD-10-CM | POA: Insufficient documentation

## 2018-05-11 DIAGNOSIS — R531 Weakness: Secondary | ICD-10-CM | POA: Diagnosis not present

## 2018-05-11 DIAGNOSIS — R05 Cough: Secondary | ICD-10-CM

## 2018-05-11 DIAGNOSIS — J45909 Unspecified asthma, uncomplicated: Secondary | ICD-10-CM | POA: Insufficient documentation

## 2018-05-11 DIAGNOSIS — R1111 Vomiting without nausea: Secondary | ICD-10-CM | POA: Diagnosis not present

## 2018-05-11 DIAGNOSIS — M7918 Myalgia, other site: Secondary | ICD-10-CM | POA: Diagnosis present

## 2018-05-11 DIAGNOSIS — R110 Nausea: Secondary | ICD-10-CM | POA: Diagnosis not present

## 2018-05-11 DIAGNOSIS — E860 Dehydration: Secondary | ICD-10-CM | POA: Insufficient documentation

## 2018-05-11 DIAGNOSIS — J111 Influenza due to unidentified influenza virus with other respiratory manifestations: Secondary | ICD-10-CM | POA: Diagnosis not present

## 2018-05-11 DIAGNOSIS — R42 Dizziness and giddiness: Secondary | ICD-10-CM | POA: Diagnosis not present

## 2018-05-11 DIAGNOSIS — G4489 Other headache syndrome: Secondary | ICD-10-CM | POA: Diagnosis not present

## 2018-05-11 DIAGNOSIS — I10 Essential (primary) hypertension: Secondary | ICD-10-CM | POA: Diagnosis not present

## 2018-05-11 DIAGNOSIS — R197 Diarrhea, unspecified: Secondary | ICD-10-CM | POA: Diagnosis not present

## 2018-05-11 DIAGNOSIS — R059 Cough, unspecified: Secondary | ICD-10-CM

## 2018-05-11 LAB — INFLUENZA PANEL BY PCR (TYPE A & B)
Influenza A By PCR: NEGATIVE
Influenza B By PCR: NEGATIVE

## 2018-05-11 LAB — COMPREHENSIVE METABOLIC PANEL
ALT: 18 U/L (ref 0–44)
AST: 24 U/L (ref 15–41)
Albumin: 3.9 g/dL (ref 3.5–5.0)
Alkaline Phosphatase: 90 U/L (ref 38–126)
Anion gap: 6 (ref 5–15)
BUN: 18 mg/dL (ref 8–23)
CO2: 25 mmol/L (ref 22–32)
Calcium: 8.9 mg/dL (ref 8.9–10.3)
Chloride: 106 mmol/L (ref 98–111)
Creatinine, Ser: 1.06 mg/dL — ABNORMAL HIGH (ref 0.44–1.00)
GFR calc Af Amer: >60 mL/min (ref >60)
GFR calc non Af Amer: 52 mL/min — ABNORMAL LOW (ref >60)
Glucose, Bld: 90 mg/dL (ref 70–99)
Potassium: 4.3 mmol/L (ref 3.5–5.1)
Sodium: 137 mmol/L (ref 135–145)
Total Bilirubin: 0.4 mg/dL (ref 0.3–1.2)
Total Protein: 8.6 g/dL — ABNORMAL HIGH (ref 6.5–8.1)

## 2018-05-11 LAB — URINALYSIS, ROUTINE W REFLEX MICROSCOPIC
Bilirubin Urine: NEGATIVE
Glucose, UA: NEGATIVE mg/dL
Hgb urine dipstick: NEGATIVE
Ketones, ur: NEGATIVE mg/dL
Leukocytes, UA: NEGATIVE
Nitrite: NEGATIVE
Protein, ur: NEGATIVE mg/dL
Specific Gravity, Urine: 1.01 (ref 1.005–1.030)
pH: 6 (ref 5.0–8.0)

## 2018-05-11 LAB — CBC
HCT: 38.4 % (ref 36.0–46.0)
Hemoglobin: 12.5 g/dL (ref 12.0–15.0)
MCH: 29.3 pg (ref 26.0–34.0)
MCHC: 32.6 g/dL (ref 30.0–36.0)
MCV: 90.1 fL (ref 78.0–100.0)
Platelets: 247 10*3/uL (ref 150–400)
RBC: 4.26 MIL/uL (ref 3.87–5.11)
RDW: 12.5 % (ref 11.5–15.5)
WBC: 5.7 10*3/uL (ref 4.0–10.5)

## 2018-05-11 LAB — LACTIC ACID, PLASMA: Lactic Acid, Venous: 1 mmol/L (ref 0.5–1.9)

## 2018-05-11 LAB — TROPONIN I: Troponin I: <0.03 ng/mL (ref <0.03)

## 2018-05-11 LAB — LIPASE, BLOOD: Lipase: 26 U/L (ref 11–51)

## 2018-05-11 MED ORDER — ONDANSETRON HCL 4 MG/2ML IJ SOLN
4.0000 mg | Freq: Once | INTRAMUSCULAR | Status: AC
  Administered 2018-05-11: 4 mg via INTRAVENOUS
  Filled 2018-05-11: qty 2

## 2018-05-11 MED ORDER — PROCHLORPERAZINE MALEATE 5 MG PO TABS: 5.0000 mg | ORAL_TABLET | Freq: Two times a day (BID) | ORAL | 0 refills | Status: DC | PRN

## 2018-05-11 MED ORDER — SODIUM CHLORIDE 0.9 % IV BOLUS
1000.0000 mL | Freq: Once | INTRAVENOUS | Status: AC
  Administered 2018-05-11: 1000 mL via INTRAVENOUS

## 2018-05-11 NOTE — Discharge Instructions (Addendum)
You were evaluated in the Emergency Department and after careful evaluation, we did not find any emergent condition requiring admission or further testing in the hospital.  Your symptoms today seem to be due to a viral infection.  Please drink plenty of fluids at home and follow-up with regular doctor.  You should discuss your chronic headaches further with your regular doctor, but you can try the medication provided as needed.  Please return to the Emergency Department if you experience any worsening of your condition.  We encourage you to follow up with a primary care provider.  Thank you for allowing [place] to be a part of your care.

## 2018-05-11 NOTE — ED Triage Notes (Signed)
Pt brought in by [facility] from home with c/o n/v/d, body aches, sore throat, chills, lower abdominal pain that started today. Pt also c/o headache that has been on going for the last 3 months. Pt reports dizziness started last night when she was standing.

## 2018-05-11 NOTE — ED Provider Notes (Signed)
[facility] Emergency Department Provider Note MRN:  [id number]  Arrival date & time: 05/11/18     Chief Complaint   Body aches History of Present Illness   ZURISADAI HELMINIAK is a 72 y.o. year-old female with a history of hypertension, hepatitis C presenting to the ED with chief complaint of body aches.  Patient felt her normal self 2 days ago.  Began with symptoms of lightheadedness, causing a fall 2 days ago.  No head trauma, no loss of consciousness, endorsing trauma to the right arm, pain in the right arm is resolved.  Multiple other symptoms, including headache, nausea, multiple episodes of nonbloody nonbilious emesis, sore throat, cough, mild shortness of breath, diffuse body aches, lower abdominal pain, dysuria, diarrhea.  Also endorsing subjective fever.  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  Review of Systems  A complete 10 system review of systems was obtained and all systems are negative except as noted in the HPI and PMH.   Patient's Health History    Past Medical History:  Diagnosis Date  . Arthritis   . Asthma   . Depression   . Hep C w/o coma, chronic ([place])   . Hyperlipemia   . Hypertension   . Type 2 diabetes mellitus ([place])     Past Surgical History:  Procedure Laterality Date  . ABDOMINAL HYSTERECTOMY    . APPENDECTOMY    . BREAST SURGERY    . CHOLECYSTECTOMY    . COLONOSCOPY N/A 07/03/2014   treated/removed as above    Family History  Problem Relation Age of Onset  . Alcohol abuse Mother   . Early death Mother   . Early death Father   . Alcohol abuse Sister   . HIV Brother   . Diabetes Brother   . Heart disease Brother   . Hepatitis C Daughter   . Hypertension Son   . Heart disease Brother   . Diabetes Brother   . Cancer Maternal Aunt   . Hypertension Maternal Aunt   . Heart disease Maternal Aunt   . Cancer Maternal Uncle   . Hypertension Maternal Uncle     Social History   Socioeconomic History  . Marital  status: Widowed    Spouse name: Not on file  . Number of children: Not on file  . Years of education: GED  . Highest education level: Not on file  Occupational History    Employer: [employer]  Social Needs  . Financial resource strain: Not on file  . Food insecurity:    Worry: Not on file    Inability: Not on file  . Transportation needs:    Medical: Not on file    Non-medical: Not on file  Tobacco Use  . Smoking status: Current Every Day Smoker    Packs/day: 0.50    Years: 50.00    Pack years: 25.00  . Smokeless tobacco: Never Used  . Tobacco comment: one pack every 2-3 days  Substance and Sexual Activity  . Alcohol use: No    Alcohol/week: 0.0 standard drinks  . Drug use: No  . Sexual activity: Not Currently    Birth control/protection: Surgical  Lifestyle  . Physical activity:    Days per week: Not on file    Minutes per session: Not on file  . Stress: Not on file  Relationships  . Social connections:    Talks on phone: Not on file    Gets together: Not on file  Attends religious service: Not on file    Active member of club or organization: Not on file    Attends meetings of clubs or organizations: Not on file    Relationship status: Not on file  . Intimate partner violence:    Fear of current or ex partner: Not on file    Emotionally abused: Not on file    Physically abused: Not on file    Forced sexual activity: Not on file  Other Topics Concern  . Not on file  Social History Narrative  . Not on file     Physical Exam  Vital Signs and Nursing Notes reviewed Vitals:   05/11/18 1437 05/11/18 1700  BP: 131/60 (!) 152/71  Pulse: (!) 51 (!) 50  Resp:  (!) 23  Temp:    SpO2: 97% 97%    CONSTITUTIONAL: Well-appearing, NAD NEURO:  Alert and oriented x 3, no focal deficits EYES:  eyes equal and reactive ENT/NECK:  no LAD, no JVD CARDIO: Regular rate, well-perfused, normal S1 and S2 PULM:  CTAB no wheezing or rhonchi GI/GU:  normal bowel  sounds, non-distended, non-tender MSK/SPINE:  No gross deformities, no edema SKIN:  no rash, atraumatic PSYCH:  Appropriate speech and behavior  Diagnostic and Interventional Summary    EKG Interpretation  Date/Time:  Friday May 11 2018 15:23:12 EDT Ventricular Rate:  51 PR Interval:    QRS Duration: 101 QT Interval:  479 QTC Calculation: 442 R Axis:   2 Text Interpretation:  Sinus rhythm Prolonged PR interval LVH with secondary repolarization abnormality Confirmed by Gerlene Fee [phone]) on 05/11/2018 3:34:53 PM      Labs Reviewed  COMPREHENSIVE METABOLIC PANEL - Abnormal; Notable for the following components:      Result Value   Creatinine, Ser 1.06 (*)    Total Protein 8.6 (*)    GFR calc non Af Amer 52 (*)    All other components within normal limits  CBC  LIPASE, BLOOD  LACTIC ACID, PLASMA  URINALYSIS, ROUTINE W REFLEX MICROSCOPIC  TROPONIN I  INFLUENZA PANEL BY PCR (TYPE A & B)    DG Chest 2 View  Final Result      Medications  sodium chloride 0.9 % bolus 1,000 mL (0 mLs Intravenous Stopped 05/11/18 1738)  ondansetron (ZOFRAN) injection 4 mg (4 mg Intravenous Given 05/11/18 1517)     Procedures Critical Care  ED Course and Medical Decision Making  I have reviewed the triage vital signs and the nursing notes.  Pertinent labs & imaging results that were available during my care of the patient were reviewed by me and considered in my medical decision making (see below for details).  Favoring viral process in the 72 year old female with myriad of symptoms, well-appearing, vital signs stable.  Labs, symptomatic control, reassess.  Recent fall with no head trauma, no neurological deficits.  Work-up unremarkable, provided reassurance.  Patient requesting discharge.  We will follow-up with PCP.  Advised more fluids at home.  After the discussed management above, the patient was determined to be safe for discharge.  The patient was in agreement with this plan  and all questions regarding their care were answered.  ED return precautions were discussed and the patient will return to the ED with any significant worsening of condition.  Barth Kirks. Sedonia Small, [place] [email or web address]  Final Clinical Impressions(s) / ED Diagnoses     ICD-10-CM   1. Viral illness B34.9   2. Cough R05  DG Chest 2 View    DG Chest 2 View  3. Dehydration E86.0     ED Discharge Orders         Ordered    prochlorperazine (COMPAZINE) 5 MG tablet  Every 12 hours PRN     05/11/18 1730             Maudie Flakes, MD 05/11/18 2300

## 2018-05-24 ENCOUNTER — Ambulatory Visit: Payer: Medicare HMO | Admitting: Gastroenterology

## 2018-05-30 ENCOUNTER — Encounter: Payer: Medicare HMO | Admitting: Obstetrics and Gynecology

## 2018-06-13 ENCOUNTER — Encounter: Payer: Medicare HMO | Admitting: Obstetrics and Gynecology

## 2018-07-10 ENCOUNTER — Encounter: Payer: Medicare HMO | Admitting: Obstetrics and Gynecology

## 2018-08-28 ENCOUNTER — Ambulatory Visit: Payer: Medicare HMO | Admitting: Gastroenterology

## 2018-08-29 ENCOUNTER — Encounter: Payer: Self-pay | Admitting: Gastroenterology

## 2018-09-01 ENCOUNTER — Encounter (HOSPITAL_COMMUNITY): Payer: Self-pay | Admitting: Emergency Medicine

## 2018-09-01 ENCOUNTER — Emergency Department (HOSPITAL_COMMUNITY)
Admission: EM | Admit: 2018-09-01 | Discharge: 2018-09-01 | Disposition: A | Discharge status: Home / Self Care | Payer: Medicare HMO | Attending: Emergency Medicine | Admitting: Emergency Medicine

## 2018-09-01 ENCOUNTER — Emergency Department (HOSPITAL_COMMUNITY): Payer: Medicare HMO

## 2018-09-01 ENCOUNTER — Other Ambulatory Visit: Payer: Self-pay

## 2018-09-01 DIAGNOSIS — F17210 Nicotine dependence, cigarettes, uncomplicated: Secondary | ICD-10-CM | POA: Diagnosis not present

## 2018-09-01 DIAGNOSIS — B349 Viral infection, unspecified: Secondary | ICD-10-CM | POA: Insufficient documentation

## 2018-09-01 DIAGNOSIS — R059 Cough, unspecified: Secondary | ICD-10-CM

## 2018-09-01 DIAGNOSIS — R509 Fever, unspecified: Secondary | ICD-10-CM | POA: Diagnosis present

## 2018-09-01 DIAGNOSIS — E119 Type 2 diabetes mellitus without complications: Secondary | ICD-10-CM | POA: Insufficient documentation

## 2018-09-01 DIAGNOSIS — J45901 Unspecified asthma with (acute) exacerbation: Secondary | ICD-10-CM | POA: Diagnosis not present

## 2018-09-01 DIAGNOSIS — R05 Cough: Secondary | ICD-10-CM | POA: Diagnosis not present

## 2018-09-01 DIAGNOSIS — I10 Essential (primary) hypertension: Secondary | ICD-10-CM | POA: Diagnosis not present

## 2018-09-01 LAB — CBC
HCT: 39.9 % (ref 36.0–46.0)
Hemoglobin: 12.5 g/dL (ref 12.0–15.0)
MCH: 29.3 pg (ref 26.0–34.0)
MCHC: 31.3 g/dL (ref 30.0–36.0)
MCV: 93.4 fL (ref 80.0–100.0)
Platelets: 277 10*3/uL (ref 150–400)
RBC: 4.27 MIL/uL (ref 3.87–5.11)
RDW: 12.5 % (ref 11.5–15.5)
WBC: 9.3 10*3/uL (ref 4.0–10.5)
nRBC: 0 % (ref 0.0–0.2)

## 2018-09-01 LAB — BASIC METABOLIC PANEL
Anion gap: 6 (ref 5–15)
BUN: 17 mg/dL (ref 8–23)
CO2: 22 mmol/L (ref 22–32)
Calcium: 8.7 mg/dL — ABNORMAL LOW (ref 8.9–10.3)
Chloride: 107 mmol/L (ref 98–111)
Creatinine, Ser: 0.87 mg/dL (ref 0.44–1.00)
GFR calc Af Amer: >60 mL/min (ref >60)
GFR calc non Af Amer: >60 mL/min (ref >60)
Glucose, Bld: 87 mg/dL (ref 70–99)
Potassium: 4 mmol/L (ref 3.5–5.1)
Sodium: 135 mmol/L (ref 135–145)

## 2018-09-01 LAB — TROPONIN I: Troponin I: <0.03 ng/mL (ref <0.03)

## 2018-09-01 MED ORDER — PREDNISONE 50 MG PO TABS
60.0000 mg | ORAL_TABLET | Freq: Once | ORAL | Status: AC
  Administered 2018-09-01: 60 mg via ORAL
  Filled 2018-09-01: qty 1

## 2018-09-01 MED ORDER — FAMOTIDINE 20 MG PO TABS: 20.0000 mg | ORAL_TABLET | Freq: Two times a day (BID) | ORAL | 0 refills | Status: DC

## 2018-09-01 MED ORDER — IPRATROPIUM-ALBUTEROL 0.5-2.5 (3) MG/3ML IN SOLN
3.0000 mL | Freq: Once | RESPIRATORY_TRACT | Status: AC
  Administered 2018-09-01: 3 mL via RESPIRATORY_TRACT
  Filled 2018-09-01: qty 3

## 2018-09-01 MED ORDER — PREDNISONE 20 MG PO TABS: 40.0000 mg | ORAL_TABLET | Freq: Every day | ORAL | 0 refills | Status: AC

## 2018-09-01 MED ORDER — SODIUM CHLORIDE 0.9 % IV BOLUS
1000.0000 mL | Freq: Once | INTRAVENOUS | Status: AC
  Administered 2018-09-01: 1000 mL via INTRAVENOUS

## 2018-09-01 NOTE — ED Provider Notes (Signed)
[facility] Emergency Department Provider Note MRN:  [id number]  Arrival date & time: 09/01/18     Chief Complaint   Fever and Cough   History of Present Illness   Megan Odom is a 73 y.o. year-old female with a history of asthma, hepatitis C, diabetes, hypertension presenting to the ED with chief complaint of fever and cough.  3 to 4 days of fever, cough, general malaise, nasal congestion, mild shortness of breath.  Patient explains that she was trying to "doctor" herself at home, but will start of doing it today.  Feels like she has the flu.  Unsure if she got the flu shot this year.  Denies headache or vision change, endorsing soreness beneath both of her breasts that is worse with coughing.  Denies abdominal pain, no dysuria.  Feels like she needs a breathing treatment.  Review of Systems  A complete 10 system review of systems was obtained and all systems are negative except as noted in the HPI and PMH.   Patient's Health History    Past Medical History:  Diagnosis Date  . Arthritis   . Asthma   . Depression   . Hep C w/o coma, chronic ([place])   . Hyperlipemia   . Hypertension   . Type 2 diabetes mellitus ([place])     Past Surgical History:  Procedure Laterality Date  . ABDOMINAL HYSTERECTOMY    . APPENDECTOMY    . BREAST SURGERY    . CHOLECYSTECTOMY    . COLONOSCOPY N/A 07/03/2014   treated/removed as above    Family History  Problem Relation Age of Onset  . Alcohol abuse Mother   . Early death Mother   . Early death Father   . Alcohol abuse Sister   . HIV Brother   . Diabetes Brother   . Heart disease Brother   . Hepatitis C Daughter   . Hypertension Son   . Heart disease Brother   . Diabetes Brother   . Cancer Maternal Aunt   . Hypertension Maternal Aunt   . Heart disease Maternal Aunt   . Cancer Maternal Uncle   . Hypertension Maternal Uncle     Social History   Socioeconomic History  . Marital status: Widowed    Spouse name:  Not on file  . Number of children: Not on file  . Years of education: GED  . Highest education level: Not on file  Occupational History    Employer: [employer]  Social Needs  . Financial resource strain: Not on file  . Food insecurity:    Worry: Not on file    Inability: Not on file  . Transportation needs:    Medical: Not on file    Non-medical: Not on file  Tobacco Use  . Smoking status: Current Every Day Smoker    Packs/day: 0.50    Years: 50.00    Pack years: 25.00  . Smokeless tobacco: Never Used  . Tobacco comment: one pack every 2-3 days  Substance and Sexual Activity  . Alcohol use: No    Alcohol/week: 0.0 standard drinks  . Drug use: No  . Sexual activity: Not Currently    Birth control/protection: Surgical  Lifestyle  . Physical activity:    Days per week: Not on file    Minutes per session: Not on file  . Stress: Not on file  Relationships  . Social connections:    Talks on phone: Not on file  Gets together: Not on file    Attends religious service: Not on file    Active member of club or organization: Not on file    Attends meetings of clubs or organizations: Not on file    Relationship status: Not on file  . Intimate partner violence:    Fear of current or ex partner: Not on file    Emotionally abused: Not on file    Physically abused: Not on file    Forced sexual activity: Not on file  Other Topics Concern  . Not on file  Social History Narrative  . Not on file     Physical Exam  Vital Signs and Nursing Notes reviewed Vitals:   09/01/18 1900 09/01/18 1944  BP:  (!) 174/84  Pulse: (!) 59 64  Resp:  16  Temp:  98.5 F (36.9 C)  SpO2: 99% 97%    CONSTITUTIONAL: Well-appearing, NAD NEURO:  Alert and oriented x 3, no focal deficits EYES:  eyes equal and reactive ENT/NECK:  no LAD, no JVD CARDIO: Regular rate, well-perfused, normal S1 and S2 PULM: Faint scattered wheezes GI/GU:  normal bowel sounds, non-distended,  non-tender MSK/SPINE:  No gross deformities, no edema SKIN:  no rash, atraumatic PSYCH:  Appropriate speech and behavior  Diagnostic and Interventional Summary    Labs Reviewed  BASIC METABOLIC PANEL - Abnormal; Notable for the following components:      Result Value   Calcium 8.7 (*)    All other components within normal limits  CBC  TROPONIN I  I-STAT TROPONIN, ED    DG Chest 2 View  Final Result      Medications  predniSONE (DELTASONE) tablet 60 mg (60 mg Oral Given 09/01/18 1800)  ipratropium-albuterol (DUONEB) 0.5-2.5 (3) MG/3ML nebulizer solution 3 mL (3 mLs Nebulization Given 09/01/18 1754)  sodium chloride 0.9 % bolus 1,000 mL (1,000 mLs Intravenous New Bag/Given 09/01/18 1753)     Procedures Critical Care  ED Course and Medical Decision Making  I have reviewed the triage vital signs and the nursing notes.  Pertinent labs & imaging results that were available during my care of the patient were reviewed by me and considered in my medical decision making (see below for details).  Favoring viral illness triggering mild asthma exacerbation, possibly influenza but outside of the treatment window.  Vital signs are stable, patient is generally well-appearing, will provide breathing treatment.  Given the patient's chest pain and age, will screen with troponin and chest x-ray but this seems more like muscular pain related to coughing.  Patient is feeling much better after neb, work-up negative.  Will provide prednisone burst, will follow-up with PCP.  After the discussed management above, the patient was determined to be safe for discharge.  The patient was in agreement with this plan and all questions regarding their care were answered.  ED return precautions were discussed and the patient will return to the ED with any significant worsening of condition.  Barth Kirks. Sedonia Small, [place] [email or web address]  Final Clinical  Impressions(s) / ED Diagnoses     ICD-10-CM   1. Viral illness B34.9   2. Cough R05 DG Chest 2 View    DG Chest 2 View  3. Mild asthma with exacerbation, unspecified whether persistent J45.901     ED Discharge Orders         Ordered    predniSONE (DELTASONE) 20 MG tablet  Daily     09/01/18 1944  famotidine (PEPCID) 20 MG tablet  2 times daily     09/01/18 1944             Maudie Flakes, MD 09/01/18 1954

## 2018-09-01 NOTE — ED Triage Notes (Signed)
Patient complains of sinus congestion, cough, and fever x 3days. States 99.5 temp at home.

## 2018-09-01 NOTE — ED Notes (Signed)
Pt reports flu like sx for several days   Smokes 1/2 PPD  No flu shot

## 2018-09-01 NOTE — Discharge Instructions (Addendum)
You were evaluated in the Emergency Department and after careful evaluation, we did not find any emergent condition requiring admission or further testing in the hospital.  Your symptoms today seem to be due to a virus, possibly the flu causing a flare of your asthma.  Please take the prednisone as directed to help with the inflammation in your lungs.  You can use the famotidine medication to help with your itchiness.  Please return to the Emergency Department if you experience any worsening of your condition.  We encourage you to follow up with a primary care provider.  Thank you for allowing [place] to be a part of your care.

## 2018-09-11 ENCOUNTER — Encounter: Payer: Medicare HMO | Admitting: Obstetrics and Gynecology

## 2018-09-27 ENCOUNTER — Encounter: Payer: Medicare HMO | Admitting: Obstetrics and Gynecology

## 2018-10-12 ENCOUNTER — Encounter: Payer: Medicare HMO | Admitting: Adult Health

## 2018-10-30 ENCOUNTER — Encounter: Payer: Self-pay | Admitting: Obstetrics and Gynecology

## 2018-11-02 ENCOUNTER — Ambulatory Visit: Payer: Medicare HMO | Admitting: Gastroenterology

## 2018-11-07 ENCOUNTER — Ambulatory Visit: Payer: Medicare HMO | Admitting: Gastroenterology

## 2018-11-07 ENCOUNTER — Other Ambulatory Visit: Payer: Self-pay

## 2018-11-07 ENCOUNTER — Ambulatory Visit (INDEPENDENT_AMBULATORY_CARE_PROVIDER_SITE_OTHER): Payer: Medicare HMO | Admitting: Gastroenterology

## 2018-11-07 ENCOUNTER — Telehealth: Payer: Self-pay

## 2018-11-07 ENCOUNTER — Encounter: Payer: Self-pay | Admitting: Gastroenterology

## 2018-11-07 VITALS — BP 150/84 | HR 64 | Temp 97.1°F | Ht 65.5 in | Wt 237.4 lb

## 2018-11-07 DIAGNOSIS — R110 Nausea: Secondary | ICD-10-CM

## 2018-11-07 DIAGNOSIS — K7460 Unspecified cirrhosis of liver: Secondary | ICD-10-CM

## 2018-11-07 DIAGNOSIS — D126 Benign neoplasm of colon, unspecified: Secondary | ICD-10-CM

## 2018-11-07 MED ORDER — PEG 3350-KCL-NA BICARB-NACL 420 G PO SOLR: 4000.0000 mL | ORAL | 0 refills | Status: DC

## 2018-11-07 NOTE — Telephone Encounter (Signed)
Called and informed pt of pre-op appt 12/27/18 at 10:00am. Letter mailed.

## 2018-11-07 NOTE — Patient Instructions (Signed)
Please have your labs and ultrasound done. You can wait 3-4 weeks to go if you prefer given concerns for Coronavirus.   Colonoscopy and upper endoscopy as scheduled. See separate instructions.   Return office visit in 6 months.   DUE TO YOUR HISTORY OF LIVER DISEASE, WE NEED TO SEE YOU AT LEAST TWICE PER YEAR.

## 2018-11-07 NOTE — Progress Notes (Signed)
Primary Care Physician:  Rosita Fire, MD  Primary Gastroenterologist:  Garfield Cornea, MD   Chief Complaint  Patient presents with  . Abnormal Lab    HPI:  Megan Odom is a 73 y.o. female here at the request of Dr. Legrand Rams for further evaluation of abnormal labs.  Patient was last seen in 2016.  She has a history of cirrhosis.  He was lost to follow-up.  History of hepatitis C status post eradication ultimately with Harvoni in 2015.  Initially treated with interferon but had to be stopped due to intolerable side effects and did not achieve SVR.  Last colonoscopy in November 2015 by Dr. Gala Romney, scattered left-sided diverticula, 5 mm polyp at the hepatic flexure, 1 cm pedunculated polyp at the rectosigmoid junction were removed.  Additional polyp in the rectosigmoid region ablated.  Pathology revealed tubular adenomas.  She was due for surveillance colonoscopy in November 2018.  Patient had a couple of no-shows in 2016.  She is rescheduled this referral several times as well.  Initial referral over 6 months ago.  We had to request her records as it had been so long since her referral is initially sent.  Labs from June 2019 showed normal CBC, positive hepatitis C antibody but negative HCVRNA, LFTs unremarkable.  Patient complains of nausea and constipation.  May go 3 to 4 days without a bowel movement.  Has been managing with juice.  Stools have been softer.  No melena or rectal bleeding.  No heartburn on omeprazole.  No dysphagia.  No lower extremity edema with use of Lasix and spironolactone.    Current Outpatient Medications  Medication Sig Dispense Refill  . albuterol (ACCUNEB) 0.63 MG/3ML nebulizer solution Take 3 mLs by nebulization every 6 (six) hours as needed for wheezing.    Marland Kitchen albuterol (PROVENTIL HFA;VENTOLIN HFA) 108 (90 Base) MCG/ACT inhaler Inhale 2 puffs into the lungs every 4 (four) hours as needed for wheezing or shortness of breath. 1 Inhaler 3  . aspirin EC 81 MG  tablet Take 81 mg by mouth daily.      . diclofenac (VOLTAREN) 75 MG EC tablet Take 1 tablet (75 mg total) by mouth 2 (two) times daily. 10 tablet 0  . fluticasone (FLONASE) 50 MCG/ACT nasal spray Place 2 sprays into the nose daily as needed for allergies.     . furosemide (LASIX) 40 MG tablet Take 40 mg by mouth daily.      Marland Kitchen gabapentin (NEURONTIN) 300 MG capsule Take 300 mg by mouth 3 (three) times daily.     Marland Kitchen losartan (COZAAR) 100 MG tablet Take 100 mg by mouth daily.    . Multiple Vitamin (MULTIVITAMIN WITH MINERALS) TABS tablet Take 1 tablet by mouth daily.    Marland Kitchen omeprazole (PRILOSEC) 40 MG capsule Take 40 mg by mouth 2 (two) times daily.     Marland Kitchen PARoxetine (PAXIL) 20 MG tablet Take 20 mg by mouth 2 (two) times daily.    Marland Kitchen Phenyleph-CPM-DM-Aspirin (ALKA-SELTZER PLUS COLD & COUGH PO) Take 2 capsules by mouth every 6 (six) hours as needed (cough/cold).    . potassium chloride (K-DUR) 10 MEQ tablet Take 10 mEq by mouth daily.    . prochlorperazine (COMPAZINE) 5 MG tablet Take 1 tablet (5 mg total) by mouth every 12 (twelve) hours as needed (headache). 20 tablet 0  . spironolactone (ALDACTONE) 25 MG tablet Take 1 tablet by mouth daily.    . verapamil (VERELAN PM) 180 MG 24 hr capsule Take 180 mg by  mouth daily.     No current facility-administered medications for this visit.     Allergies as of 11/07/2018 - Review Complete 11/07/2018  Allergen Reaction Noted  . Darvocet [propoxyphene n-acetaminophen] Other (See Comments) 02/26/2011  . Latex Swelling 04/24/2012  . Other Other (See Comments) 02/13/2013  . Penicillins Other (See Comments) 02/26/2011  . Tetracyclines & related Other (See Comments) 02/26/2011  . Adhesive [tape] Itching and Rash 12/04/2012    Past Medical History:  Diagnosis Date  . Arthritis   . Asthma   . Cirrhosis ([place])   . Depression   . Hep C w/o coma, chronic ([place])    eradicated with Harvoni 2015.   Marland Kitchen Hyperlipemia   . Hypertension   . Type 2 diabetes mellitus  ([place])     Past Surgical History:  Procedure Laterality Date  . ABDOMINAL HYSTERECTOMY    . APPENDECTOMY    . BREAST SURGERY     traumatic injury  . CHOLECYSTECTOMY    . COLONOSCOPY N/A 07/03/2014   Dr. Gala Romney: 5 mm and 1 cm tubular adenomas removed, diverticulosis.  Next colonoscopy in 2018.    Family History  Problem Relation Age of Onset  . Alcohol abuse Mother   . Early death Mother   . Early death Father   . Alcohol abuse Sister   . HIV Brother   . Diabetes Brother   . Heart disease Brother   . Hepatitis C Daughter   . Hypertension Son   . Heart disease Brother   . Diabetes Brother   . Cancer Maternal Aunt        stomach  . Hypertension Maternal Aunt   . Heart disease Maternal Aunt   . Cancer Maternal Uncle        prostate  . Hypertension Maternal Uncle   . Colon cancer Neg Hx     Social History   Socioeconomic History  . Marital status: Widowed    Spouse name: Not on file  . Number of children: Not on file  . Years of education: GED  . Highest education level: Not on file  Occupational History  . 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 Financial resource strain: Not on file  . Food insecurity:    Worry: Not on file    Inability: Not on file  . Transportation needs:    Medical: Not on file    Non-medical: Not on file  Tobacco Use  . Smoking status: Current Every Day Smoker    Packs/day: 0.50    Years: 50.00    Pack years: 25.00  . Smokeless tobacco: Never Used  . Tobacco comment: one pack every 2-3 days  Substance and Sexual Activity  . Alcohol use: No    Alcohol/week: 0.0 standard drinks  . Drug use: No  . Sexual activity: Not Currently    Birth control/protection: Surgical  Lifestyle  . Physical activity:    Days per week: Not on file    Minutes per session: Not on file  . Stress: Not on file  Relationships  . Social connections:    Talks on phone: Not on file    Gets together: Not on file    Attends religious  service: Not on file    Active member of club or organization: Not on file    Attends meetings of clubs or organizations: Not on file    Relationship status: Not on file  . Intimate partner violence:    Fear of  current or ex partner: Not on file    Emotionally abused: Not on file    Physically abused: Not on file    Forced sexual activity: Not on file  Other Topics Concern  . Not on file  Social History Narrative  . Not on file      ROS:  General: Negative for anorexia, weight loss, fever, chills, fatigue, weakness. Eyes: Negative for vision changes.  ENT: Negative for hoarseness, difficulty swallowing , nasal congestion. CV: Negative for chest pain, angina, palpitations, dyspnea on exertion, peripheral edema.  Respiratory: Negative for dyspnea at rest, dyspnea on exertion, cough, sputum, wheezing.  GI: See history of present illness. GU:  Negative for dysuria, hematuria, urinary incontinence, urinary frequency, nocturnal urination.  MS: Negative for joint pain, low back pain.  Derm: Negative for rash or itching.  Neuro: Negative for weakness, abnormal sensation, seizure, frequent headaches, memory loss, confusion.  Psych: Negative for anxiety, depression, suicidal ideation, hallucinations.  Endo: Negative for unusual weight change.  Heme: Negative for bruising or bleeding. Allergy: Negative for rash or hives.    Physical Examination:  BP (!) 150/84   Pulse 64   Temp (!) 97.1 F (36.2 C) (Oral)   Ht 5' 5.5" (1.664 m)   Wt 237 lb 6.4 oz (107.7 kg)   BMI 38.90 kg/m    General: Well-nourished, well-developed in no acute distress.  Head: Normocephalic, atraumatic.   Eyes: Conjunctiva pink, no icterus. Mouth: Oropharyngeal mucosa moist and pink , no lesions erythema or exudate. Neck: Supple without thyromegaly, masses, or lymphadenopathy.  Lungs: Clear to auscultation bilaterally.  Heart: Regular rate and rhythm, no murmurs rubs or gallops.  Abdomen: Bowel sounds are  normal, nontender, nondistended, no hepatosplenomegaly or masses, no abdominal bruits or    hernia , no rebound or guarding.   Rectal: Not performed Extremities: No lower extremity edema. No clubbing or deformities.  Neuro: Alert and oriented x 4 , grossly normal neurologically.  Skin: Warm and dry, no rash or jaundice.   Psych: Alert and cooperative, normal mood and affect.  Labs: Lab Results  Component Value Date   WBC 9.3 09/01/2018   HGB 12.5 09/01/2018   HCT 39.9 09/01/2018   MCV 93.4 09/01/2018   PLT 277 09/01/2018   Lab Results  Component Value Date   CREATININE 0.87 09/01/2018   BUN 17 09/01/2018   NA 135 09/01/2018   K 4.0 09/01/2018   CL 107 09/01/2018   CO2 22 09/01/2018   Lab Results  Component Value Date   ALT 18 05/11/2018   AST 24 05/11/2018   ALKPHOS 90 05/11/2018   BILITOT 0.4 05/11/2018      Imaging Studies: No results found.

## 2018-11-08 ENCOUNTER — Encounter: Payer: Self-pay | Admitting: Gastroenterology

## 2018-11-08 NOTE — Assessment & Plan Note (Addendum)
73 year old with history of hepatitis C cirrhosis, status post successful treatment of hepatitis C in 2015.  She is overdue for labs and imaging.  She is never had upper endoscopy for variceal screening.  She has some vague nausea.  Typical reflux well controlled.  Plan for EGD for variceal screening in the near future.  Plan for deep sedation due to polypharmacy.  I have discussed the risks, alternatives, benefits with regards to but not limited to the risk of reaction to medication, bleeding, infection, perforation and the patient is agreeable to proceed. Written consent to be obtained.  Abdominal ultrasound for hepatoma screening.  Update labs.  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

## 2018-11-08 NOTE — Assessment & Plan Note (Addendum)
Overdue for surveillance colonoscopy.  Need for deep sedation due to polypharmacy.  I have discussed the risks, alternatives, benefits with regards to but not limited to the risk of reaction to medication, bleeding, infection, perforation and the patient is agreeable to proceed. Written consent to be obtained.

## 2018-11-09 ENCOUNTER — Telehealth: Payer: Self-pay | Admitting: *Deleted

## 2018-11-09 ENCOUNTER — Other Ambulatory Visit: Payer: Self-pay | Admitting: *Deleted

## 2018-11-09 DIAGNOSIS — K7460 Unspecified cirrhosis of liver: Secondary | ICD-10-CM

## 2018-11-09 DIAGNOSIS — D126 Benign neoplasm of colon, unspecified: Secondary | ICD-10-CM

## 2018-11-09 DIAGNOSIS — R110 Nausea: Secondary | ICD-10-CM

## 2018-11-09 NOTE — Telephone Encounter (Signed)
-----   Message from Mahala Menghini, PA-C sent at 11/08/2018  4:55 PM EDT ----- Can we make sure possible variceal banding is part of her EGD/TCS case.

## 2018-11-09 NOTE — Telephone Encounter (Signed)
New orders have been entered.

## 2018-11-12 NOTE — Progress Notes (Signed)
CC'D TO PCP °

## 2018-11-13 ENCOUNTER — Ambulatory Visit (HOSPITAL_COMMUNITY): Admission: RE | Admit: 2018-11-13 | Payer: Medicare HMO | Source: Ambulatory Visit

## 2018-11-13 ENCOUNTER — Telehealth: Payer: Self-pay

## 2018-11-13 NOTE — Telephone Encounter (Signed)
EGD approved. PA# [id number]. Valid 01/03/19-04/03/19.

## 2018-11-13 NOTE — Telephone Encounter (Signed)
PA for EGD submitted via [employer] website. Case went to clinical review. Clinical notes faxed to [facility]. [facility] Tracking Number: [id number].

## 2018-11-16 ENCOUNTER — Ambulatory Visit
Admission: RE | Admit: 2018-11-16 | Discharge: 2018-11-16 | Disposition: A | Discharge status: Home / Self Care | Payer: Medicare HMO | Source: Ambulatory Visit | Attending: Gastroenterology | Admitting: Gastroenterology

## 2018-11-16 ENCOUNTER — Other Ambulatory Visit: Payer: Self-pay

## 2018-11-16 DIAGNOSIS — K7460 Unspecified cirrhosis of liver: Secondary | ICD-10-CM | POA: Insufficient documentation

## 2018-11-19 ENCOUNTER — Other Ambulatory Visit: Payer: Self-pay | Admitting: *Deleted

## 2018-11-19 DIAGNOSIS — K7460 Unspecified cirrhosis of liver: Secondary | ICD-10-CM

## 2018-11-28 LAB — COMPREHENSIVE METABOLIC PANEL
AG Ratio: 0.9 (calc) — ABNORMAL LOW (ref 1.0–2.5)
ALT: 11 U/L (ref 6–29)
AST: 16 U/L (ref 10–35)
Albumin: 4 g/dL (ref 3.6–5.1)
Alkaline phosphatase (APISO): 89 U/L (ref 37–153)
BUN/Creatinine Ratio: 17 (calc) (ref 6–22)
BUN: 18 mg/dL (ref 7–25)
CO2: 25 mmol/L (ref 20–32)
Calcium: 9.3 mg/dL (ref 8.6–10.4)
Chloride: 106 mmol/L (ref 98–110)
Creat: 1.09 mg/dL — ABNORMAL HIGH (ref 0.60–0.93)
Globulin: 4.6 g/dL (calc) — ABNORMAL HIGH (ref 1.9–3.7)
Glucose, Bld: 92 mg/dL (ref 65–139)
Potassium: 3.9 mmol/L (ref 3.5–5.3)
Sodium: 140 mmol/L (ref 135–146)
Total Bilirubin: 0.4 mg/dL (ref 0.2–1.2)
Total Protein: 8.6 g/dL — ABNORMAL HIGH (ref 6.1–8.1)

## 2018-11-28 LAB — CBC WITH DIFFERENTIAL/PLATELET
Absolute Monocytes: 694 cells/uL (ref 200–950)
Basophils Absolute: 29 cells/uL (ref 0–200)
Basophils Relative: 0.3 %
Eosinophils Absolute: 162 cells/uL (ref 15–500)
Eosinophils Relative: 1.7 %
HCT: 36.4 % (ref 35.0–45.0)
Hemoglobin: 12.1 g/dL (ref 11.7–15.5)
Lymphs Abs: 3040 cells/uL (ref 850–3900)
MCH: 29.2 pg (ref 27.0–33.0)
MCHC: 33.2 g/dL (ref 32.0–36.0)
MCV: 87.7 fL (ref 80.0–100.0)
MPV: 12.3 fL (ref 7.5–12.5)
Monocytes Relative: 7.3 %
Neutro Abs: 5577 cells/uL (ref 1500–7800)
Neutrophils Relative %: 58.7 %
Platelets: 294 10*3/uL (ref 140–400)
RBC: 4.15 10*6/uL (ref 3.80–5.10)
RDW: 12.1 % (ref 11.0–15.0)
Total Lymphocyte: 32 %
WBC: 9.5 10*3/uL (ref 3.8–10.8)

## 2018-11-28 LAB — HEPATITIS C RNA QUANTITATIVE
HCV Quantitative Log: <1.18 Log IU/mL
HCV RNA, PCR, QN: <15 IU/mL

## 2018-11-28 LAB — PROTIME-INR
INR: 1
Prothrombin Time: 10.3 s (ref 9.0–11.5)

## 2018-11-28 LAB — HEPATITIS C GENOTYPE: HCV Genotype: NOT DETECTED

## 2018-12-03 ENCOUNTER — Other Ambulatory Visit: Payer: Self-pay

## 2018-12-03 DIAGNOSIS — K7460 Unspecified cirrhosis of liver: Secondary | ICD-10-CM

## 2018-12-03 DIAGNOSIS — R188 Other ascites: Principal | ICD-10-CM

## 2018-12-24 ENCOUNTER — Telehealth: Payer: Self-pay

## 2018-12-24 NOTE — Telephone Encounter (Signed)
Called pt, TCS/EGD/-/+var banding w/Propofol w/RMR that was for 01/03/19 rescheduled to 02/14/19 at 12:00pm d/t COVID-19 restrictions. LMOVM for endo scheduler. Will mail new instructions when pre-op appt is rescheduled.

## 2018-12-25 NOTE — Telephone Encounter (Signed)
Pre-op appt 02/07/19 at 9:00am. Appt letter mailed with new procedure instructions.

## 2018-12-27 ENCOUNTER — Other Ambulatory Visit (HOSPITAL_COMMUNITY): Payer: Medicare HMO

## 2018-12-27 ENCOUNTER — Inpatient Hospital Stay (HOSPITAL_COMMUNITY): Admission: RE | Admit: 2018-12-27 | Payer: Medicare HMO | Source: Ambulatory Visit

## 2019-01-03 ENCOUNTER — Ambulatory Visit (HOSPITAL_COMMUNITY): Admit: 2019-01-03 | Payer: Medicare HMO | Admitting: Internal Medicine

## 2019-01-03 ENCOUNTER — Encounter (HOSPITAL_COMMUNITY): Payer: Self-pay

## 2019-01-03 SURGERY — COLONOSCOPY WITH PROPOFOL
Anesthesia: Monitor Anesthesia Care

## 2019-01-22 ENCOUNTER — Telehealth: Payer: Self-pay | Admitting: Internal Medicine

## 2019-01-22 NOTE — Telephone Encounter (Signed)
EGD PA was valid till 04/03/19.  Called [place] and spoke to [place]. Informed her new date of service would be 04/04/19. Deatra Canter advised authorization for EGD had been extended till 05/03/19 d/t COVID-19.

## 2019-01-22 NOTE — Telephone Encounter (Signed)
Called pt, TCS/EGD/-/+var banding w/Propofol w/RMR rescheduled to 04/04/19 at 12:00pm. Endo scheduler informed. Pre-op appt 04/01/19 at 10:00am. Letter mailed with new procedure instructions.

## 2019-01-22 NOTE — Telephone Encounter (Signed)
PATIENT WANTS TO RESCHEDULE PROCEDURE TO Megan Odom

## 2019-02-07 ENCOUNTER — Other Ambulatory Visit (HOSPITAL_COMMUNITY): Payer: Medicare HMO

## 2019-03-13 ENCOUNTER — Telehealth: Payer: Self-pay | Admitting: Internal Medicine

## 2019-03-13 NOTE — Telephone Encounter (Signed)
Pt is wanting to reschedule her procedure with RMR on 8/13. She is scared of covid and going to the hospital. She wants to reschedule around [facility]. Please call her at [phone]

## 2019-03-13 NOTE — Telephone Encounter (Signed)
Noted  

## 2019-03-13 NOTE — Telephone Encounter (Signed)
Called pt, procedure canceled for 04/04/19. OV scheduled for 05/10/19 to reschedule procedure d/t propofol and last OV was 10/2018. Pt wants to wait till November to have procedure. LMOVM for endo scheduler.  FYI to LSL.

## 2019-04-01 ENCOUNTER — Other Ambulatory Visit (HOSPITAL_COMMUNITY): Payer: Medicare HMO

## 2019-04-04 ENCOUNTER — Ambulatory Visit (HOSPITAL_COMMUNITY): Admission: RE | Admit: 2019-04-04 | Payer: Medicare HMO | Source: Home / Self Care | Admitting: Internal Medicine

## 2019-04-04 ENCOUNTER — Encounter (HOSPITAL_COMMUNITY): Admission: RE | Payer: Self-pay | Source: Home / Self Care

## 2019-04-04 SURGERY — COLONOSCOPY WITH PROPOFOL
Anesthesia: Monitor Anesthesia Care

## 2019-04-12 ENCOUNTER — Other Ambulatory Visit (HOSPITAL_COMMUNITY): Payer: Self-pay | Admitting: Internal Medicine

## 2019-04-12 DIAGNOSIS — Z7182 Exercise counseling: Secondary | ICD-10-CM | POA: Diagnosis not present

## 2019-04-12 DIAGNOSIS — I10 Essential (primary) hypertension: Secondary | ICD-10-CM | POA: Diagnosis not present

## 2019-04-12 DIAGNOSIS — Z1231 Encounter for screening mammogram for malignant neoplasm of breast: Secondary | ICD-10-CM

## 2019-04-12 DIAGNOSIS — F331 Major depressive disorder, recurrent, moderate: Secondary | ICD-10-CM | POA: Diagnosis not present

## 2019-04-12 DIAGNOSIS — B182 Chronic viral hepatitis C: Secondary | ICD-10-CM | POA: Diagnosis not present

## 2019-04-12 DIAGNOSIS — F17210 Nicotine dependence, cigarettes, uncomplicated: Secondary | ICD-10-CM | POA: Diagnosis not present

## 2019-04-12 DIAGNOSIS — Z0001 Encounter for general adult medical examination with abnormal findings: Secondary | ICD-10-CM | POA: Diagnosis not present

## 2019-04-12 DIAGNOSIS — Z1389 Encounter for screening for other disorder: Secondary | ICD-10-CM | POA: Diagnosis not present

## 2019-04-12 DIAGNOSIS — Z1331 Encounter for screening for depression: Secondary | ICD-10-CM | POA: Diagnosis not present

## 2019-04-19 DIAGNOSIS — D892 Hypergammaglobulinemia, unspecified: Secondary | ICD-10-CM | POA: Diagnosis not present

## 2019-04-20 DIAGNOSIS — Z20828 Contact with and (suspected) exposure to other viral communicable diseases: Secondary | ICD-10-CM | POA: Diagnosis not present

## 2019-04-20 DIAGNOSIS — Z7189 Other specified counseling: Secondary | ICD-10-CM | POA: Diagnosis not present

## 2019-04-26 ENCOUNTER — Emergency Department (HOSPITAL_COMMUNITY)
Admission: EM | Admit: 2019-04-26 | Discharge: 2019-04-26 | Disposition: A | Discharge status: Home / Self Care | Payer: Medicare HMO | Attending: Emergency Medicine | Admitting: Emergency Medicine

## 2019-04-26 ENCOUNTER — Encounter (HOSPITAL_COMMUNITY): Payer: Self-pay

## 2019-04-26 ENCOUNTER — Other Ambulatory Visit: Payer: Self-pay

## 2019-04-26 ENCOUNTER — Ambulatory Visit (HOSPITAL_COMMUNITY)
Admission: RE | Admit: 2019-04-26 | Discharge: 2019-04-26 | Disposition: A | Discharge status: Home / Self Care | Payer: Medicare HMO | Source: Ambulatory Visit | Attending: Internal Medicine | Admitting: Internal Medicine

## 2019-04-26 ENCOUNTER — Emergency Department (HOSPITAL_COMMUNITY): Payer: Medicare HMO

## 2019-04-26 DIAGNOSIS — E119 Type 2 diabetes mellitus without complications: Secondary | ICD-10-CM | POA: Insufficient documentation

## 2019-04-26 DIAGNOSIS — F17200 Nicotine dependence, unspecified, uncomplicated: Secondary | ICD-10-CM | POA: Diagnosis not present

## 2019-04-26 DIAGNOSIS — M722 Plantar fascial fibromatosis: Secondary | ICD-10-CM | POA: Diagnosis not present

## 2019-04-26 DIAGNOSIS — Z79899 Other long term (current) drug therapy: Secondary | ICD-10-CM | POA: Insufficient documentation

## 2019-04-26 DIAGNOSIS — Z1231 Encounter for screening mammogram for malignant neoplasm of breast: Secondary | ICD-10-CM | POA: Insufficient documentation

## 2019-04-26 DIAGNOSIS — I5032 Chronic diastolic (congestive) heart failure: Secondary | ICD-10-CM | POA: Insufficient documentation

## 2019-04-26 DIAGNOSIS — Z91040 Latex allergy status: Secondary | ICD-10-CM | POA: Insufficient documentation

## 2019-04-26 DIAGNOSIS — J45909 Unspecified asthma, uncomplicated: Secondary | ICD-10-CM | POA: Diagnosis not present

## 2019-04-26 DIAGNOSIS — I110 Hypertensive heart disease with heart failure: Secondary | ICD-10-CM | POA: Insufficient documentation

## 2019-04-26 DIAGNOSIS — M79672 Pain in left foot: Secondary | ICD-10-CM | POA: Diagnosis present

## 2019-04-26 DIAGNOSIS — M2012 Hallux valgus (acquired), left foot: Secondary | ICD-10-CM | POA: Diagnosis not present

## 2019-04-26 DIAGNOSIS — Z7982 Long term (current) use of aspirin: Secondary | ICD-10-CM | POA: Diagnosis not present

## 2019-04-26 MED ORDER — ONDANSETRON HCL 4 MG PO TABS
4.0000 mg | ORAL_TABLET | Freq: Once | ORAL | Status: AC
  Administered 2019-04-26: 4 mg via ORAL
  Filled 2019-04-26: qty 1

## 2019-04-26 MED ORDER — TRAMADOL HCL 50 MG PO TABS: 50.0000 mg | ORAL_TABLET | Freq: Four times a day (QID) | ORAL | 0 refills | Status: DC | PRN

## 2019-04-26 MED ORDER — ACETAMINOPHEN 500 MG PO TABS
1000.0000 mg | ORAL_TABLET | Freq: Once | ORAL | Status: AC
  Administered 2019-04-26: 1000 mg via ORAL
  Filled 2019-04-26: qty 2

## 2019-04-26 MED ORDER — PREDNISONE 20 MG PO TABS
40.0000 mg | ORAL_TABLET | Freq: Once | ORAL | Status: AC
  Administered 2019-04-26: 16:00:00 40 mg via ORAL
  Filled 2019-04-26: qty 2

## 2019-04-26 MED ORDER — DEXAMETHASONE 4 MG PO TABS: 4.0000 mg | ORAL_TABLET | Freq: Two times a day (BID) | ORAL | 0 refills | Status: DC

## 2019-04-26 MED ORDER — KETOROLAC TROMETHAMINE 10 MG PO TABS
10.0000 mg | ORAL_TABLET | Freq: Once | ORAL | Status: AC
  Administered 2019-04-26: 16:00:00 10 mg via ORAL
  Filled 2019-04-26: qty 1

## 2019-04-26 NOTE — Discharge Instructions (Signed)
Your x-ray is negative for fracture, dislocation, or foreign body in the foot.  Your examination is consistent with plantar fasciitis.  This is an inflammation in the flexible fascia layer and the bottom of your foot and attaches to your heel area.  On your discharge instructions there are exercises that can help with the plantar fasciitis, you may find these helpful.  Please use Decadron 2 times daily with food.  Please use Ultram for pain not improved by Tylenol. This medication may cause drowsiness. Please do not drink, drive, or participate in activity that requires concentration while taking this medication.  Please see Dr. Aline Brochure for orthopedic evaluation and management of this issue.

## 2019-04-26 NOTE — ED Notes (Signed)
Signature pad not working. 

## 2019-04-26 NOTE — ED Provider Notes (Signed)
[facility] EMERGENCY DEPARTMENT Provider Note   CSN: [id number] Arrival date & time: 04/26/19  1358     History   Chief Complaint Chief Complaint  Patient presents with  . Foot Pain    HPI Megan Odom is a 73 y.o. female.     Patient is a 73 year old female who presents to the emergency department with a complaint of left foot pain.  The patient states this problem started about 3 weeks ago.  She has pain mostly of her heel, but she also notices pain in the middle of her foot.  She says it feels as though she is stepping on a ball.  And when she puts pressure on it the pain leaves her foot and goes nearly up to her hip.  The pain is worse when she is standing or walking or applying weight.  It does improve a little when she is at rest, but she still has some pain and discomfort present.  No known injury or trauma reported.  The history is provided by the patient.  Foot Pain Pertinent negatives include no chest pain, no abdominal pain and no shortness of breath.    Past Medical History:  Diagnosis Date  . Arthritis   . Asthma   . Cirrhosis ([place])   . Depression   . Hep C w/o coma, chronic ([place])    eradicated with Harvoni 2015.   Marland Kitchen Hyperlipemia   . Hypertension   . Type 2 diabetes mellitus [facility])     Patient Active Problem List   Diagnosis Date Noted  . Nausea without vomiting 11/07/2018  . Colon adenomas 11/07/2018  . Intractable abdominal pain 03/25/2016  . Nausea vomiting and diarrhea 03/25/2016  . AKI (acute kidney injury) ([place]) 03/25/2016  . Depression with anxiety 03/25/2016  . Chronic diastolic (congestive) heart failure ([place]) 03/25/2016  . Hyperglycemia 03/25/2016  . GERD (gastroesophageal reflux disease) 03/25/2016  . Hypokalemia 03/25/2016  . Hypocalcemia 03/25/2016  . Headache 11/20/2014  . Dizziness 11/20/2014  . Hepatic cirrhosis ([place]) 11/05/2014  . Chest pain 11/05/2014  . Dysphagia 11/05/2014  . History of hepatitis C 11/05/2014  . Sleep  apnea, obstructive 01/17/2013  . Paroxysmal atrial tachycardia ([place]) 01/17/2013  . Essential hypertension 01/17/2013  . Morbid obesity ([place]) 01/17/2013  . Chest pain at rest 01/17/2013  . Difficulty in walking(719.7) 02/17/2012  . Bilateral leg weakness 02/17/2012  . Back pain 02/16/2012  . Bilateral anterior knee pain 02/16/2012  . Knee pain, left 02/16/2012    Past Surgical History:  Procedure Laterality Date  . ABDOMINAL HYSTERECTOMY    . APPENDECTOMY    . BREAST SURGERY     traumatic injury  . CHOLECYSTECTOMY    . COLONOSCOPY N/A 07/03/2014   Dr. Gala Romney: 5 mm and 1 cm tubular adenomas removed, diverticulosis.  Next colonoscopy in 2018.     OB History    Gravida  4   Para  2   Term  2   Preterm      AB  2   Living  2     SAB  2   TAB      Ectopic      Multiple      Live Births               Home Medications    Prior to Admission medications   Medication Sig Start Date End Date Taking? Authorizing Provider  albuterol (ACCUNEB) 0.63 MG/3ML nebulizer solution Take 3 mLs by nebulization every  6 (six) hours as needed for wheezing.    [provider]  albuterol (PROVENTIL HFA;VENTOLIN HFA) 108 (90 Base) MCG/ACT inhaler Inhale 2 puffs into the lungs every 4 (four) hours as needed for wheezing or shortness of breath. 07/18/16   Noemi Chapel, MD  aspirin EC 81 MG tablet Take 81 mg by mouth daily.      [provider]  diclofenac (VOLTAREN) 75 MG EC tablet Take 1 tablet (75 mg total) by mouth 2 (two) times daily. 03/21/17   Lily Kocher, PA-C  fluticasone (FLONASE) 50 MCG/ACT nasal spray Place 2 sprays into the nose daily as needed for allergies.     [provider]  furosemide (LASIX) 40 MG tablet Take 40 mg by mouth daily.      [provider]  gabapentin (NEURONTIN) 300 MG capsule Take 300 mg by mouth 3 (three) times daily.  06/24/16   [provider]  losartan (COZAAR) 100 MG tablet Take 100 mg by mouth daily.     [provider]  Multiple Vitamin (MULTIVITAMIN WITH MINERALS) TABS tablet Take 1 tablet by mouth daily.    [provider]  omeprazole (PRILOSEC) 40 MG capsule Take 40 mg by mouth 2 (two) times daily.     [provider]  PARoxetine (PAXIL) 20 MG tablet Take 20 mg by mouth 2 (two) times daily.    [provider]  Phenyleph-CPM-DM-Aspirin (ALKA-SELTZER PLUS COLD & COUGH PO) Take 2 capsules by mouth every 6 (six) hours as needed (cough/cold).    [provider]  polyethylene glycol-electrolytes (TRILYTE) 420 g solution Take 4,000 mLs by mouth as directed. 11/07/18   Rourk, Cristopher Estimable, MD  potassium chloride (K-DUR) 10 MEQ tablet Take 10 mEq by mouth daily.    [provider]  prochlorperazine (COMPAZINE) 5 MG tablet Take 1 tablet (5 mg total) by mouth every 12 (twelve) hours as needed (headache). 05/11/18   Maudie Flakes, MD  spironolactone (ALDACTONE) 25 MG tablet Take 1 tablet by mouth daily. 10/16/18   [provider]  verapamil (VERELAN PM) 180 MG 24 hr capsule Take 180 mg by mouth daily. 06/23/16   [provider]    Family History Family History  Problem Relation Age of Onset  . Alcohol abuse Mother   . Early death Mother   . Early death Father   . Alcohol abuse Sister   . HIV Brother   . Diabetes Brother   . Heart disease Brother   . Hepatitis C Daughter   . Hypertension Son   . Heart disease Brother   . Diabetes Brother   . Cancer Maternal Aunt        stomach  . Hypertension Maternal Aunt   . Heart disease Maternal Aunt   . Cancer Maternal Uncle        prostate  . Hypertension Maternal Uncle   . Colon cancer Neg Hx     Social History Social History   Tobacco Use  . Smoking status: Current Every Day Smoker    Packs/day: 0.50    Years: 50.00    Pack years: 25.00  . Smokeless tobacco: Never Used  . Tobacco comment: one pack every 2-3 days  Substance Use Topics  . Alcohol use: No    Alcohol/week:  0.0 standard drinks  . Drug use: No     Allergies   Darvocet [propoxyphene n-acetaminophen], Latex, Other, Penicillins, Tetracyclines & related, and Adhesive [tape]   Review of Systems Review of Systems  Constitutional: Negative for activity change and appetite change.  HENT: Negative for congestion, ear discharge, ear pain, facial swelling, nosebleeds, rhinorrhea, sneezing and tinnitus.   Eyes: Negative for photophobia, pain and discharge.  Respiratory: Negative for cough, choking, shortness of breath and wheezing.   Cardiovascular: Negative for chest pain, palpitations and leg swelling.  Gastrointestinal: Negative for abdominal pain, blood in stool, constipation, diarrhea, nausea and vomiting.  Genitourinary: Negative for difficulty urinating, dysuria, flank pain, frequency and hematuria.  Musculoskeletal: Positive for arthralgias and gait problem. Negative for back pain, myalgias and neck pain.  Skin: Negative for color change, rash and wound.  Neurological: Negative for dizziness, seizures, syncope, facial asymmetry, speech difficulty, weakness and numbness.  Hematological: Negative for adenopathy. Does not bruise/bleed easily.  Psychiatric/Behavioral: Negative for agitation, confusion, hallucinations, self-injury and suicidal ideas. The patient is not nervous/anxious.      Physical Exam Updated Vital Signs BP 132/69 (BP Location: Right Arm)   Pulse (!) 57   Temp 98 F (36.7 C) (Oral)   Resp 18   Ht 5' 5.5" (1.664 m)   Wt 109.8 kg   SpO2 95%   BMI 39.66 kg/m   Physical Exam Vitals signs and nursing note reviewed.  Constitutional:      Appearance: She is well-developed. She is not toxic-appearing.  HENT:     Head: Normocephalic.     Right Ear: Tympanic membrane and external ear normal.     Left Ear: Tympanic membrane and external ear normal.  Eyes:     General: Lids are normal.     Pupils: Pupils are equal, round, and reactive to light.  Neck:      Musculoskeletal: Normal range of motion and neck supple.     Vascular: No carotid bruit.  Cardiovascular:     Rate and Rhythm: Normal rate and regular rhythm.     Pulses: Normal pulses.     Heart sounds: Normal heart sounds.  Pulmonary:     Effort: No respiratory distress.     Breath sounds: Normal breath sounds.  Abdominal:     General: Bowel sounds are normal.     Palpations: Abdomen is soft.     Tenderness: There is no abdominal tenderness. There is no guarding.  Musculoskeletal: Normal range of motion.     Comments: The dorsalis pedis pulse is 2+ bilaterally.  There are no temperature changes between the right and the left foot.  The Achilles tendon on the left is intact.  There is some pain to deep palpation of the left heel.  There is also pain to palpation in the midfoot on the left.  There is pain with extension of the toes on the left.  There is negative Homans sign.  There are no temperature changes of the left lower extremity.  There is some crepitus with attempted range of motion of the left knee.  There is soreness with attempted range of motion of the left hip, but no dislocation and no pain to direct palpation.  Lymphadenopathy:     Head:     Right side of head: No submandibular adenopathy.     Left side of head: No submandibular adenopathy.     Cervical: No cervical adenopathy.  Skin:    General: Skin is warm and dry.  Neurological:     Mental Status: She is alert and oriented to person, place, and time.     Cranial Nerves: No cranial nerve deficit.     Sensory: No sensory deficit.  Psychiatric:  Speech: Speech normal.      ED Treatments / Results  Labs (all labs ordered are listed, but only abnormal results are displayed) Labs Reviewed - No data to display  EKG None  Radiology Dg Foot Complete Left  Result Date: 04/26/2019 CLINICAL DATA:  Left foot pain for the past 3 weeks. No known injury. EXAM: LEFT FOOT - COMPLETE 3+ VIEW COMPARISON:  None.  FINDINGS: Diffuse osteopenia. Mild hallux valgus. Otherwise, unremarkable bones and soft tissues. IMPRESSION: Mild hallux valgus. No acute abnormality. Electronically Signed   By: Claudie Revering M.D.   On: 04/26/2019 14:44    Procedures Procedures (including critical care time)  Medications Ordered in ED Medications  predniSONE (DELTASONE) tablet 40 mg (has no administration in time range)  ketorolac (TORADOL) tablet 10 mg (has no administration in time range)  acetaminophen (TYLENOL) tablet 1,000 mg (has no administration in time range)  ondansetron (ZOFRAN) tablet 4 mg (has no administration in time range)     Initial Impression / Assessment and Plan / ED Course  I have reviewed the triage vital signs and the nursing notes.  Pertinent labs & imaging results that were available during my care of the patient were reviewed by me and considered in my medical decision making (see chart for details).          Final Clinical Impressions(s) / ED Diagnoses MDM  Vital signs reviewed.  I have reviewed the x-rays of the left foot.  There is no fracture, no dislocation, no foreign body appreciated.  There is no air or gas appreciated about the foot.  The examination favors plantar fasciitis.  The patient will be treated with a short course of a steroid medication, and medication for pain.  Patient is asked to follow-up with her primary physician in the office.  Patient states that she has a walker to use at home.   Final diagnoses:  Plantar fasciitis of left foot    ED Discharge Orders         Ordered    dexamethasone (DECADRON) 4 MG tablet  2 times daily with meals     04/26/19 1614    traMADol (ULTRAM) 50 MG tablet  Every 6 hours PRN     04/26/19 1614           Lily Kocher, PA-C 04/26/19 1618    Davonna Belling, MD 04/26/19 2258

## 2019-04-26 NOTE — ED Triage Notes (Signed)
Pt c/o left foot pain x 3 weeks. Pt denies injury. Pt states she is unable to put weight on her foot. Pt denies any sore to foot.

## 2019-04-26 NOTE — ED Notes (Signed)
Patient reports L foot pain that started 3 weeks ago. Tender to palpation at heel and lower achilles. Patient states she has been unable to put pressure on her foot. No obvious deformity noted. Pulse intact.

## 2019-05-10 ENCOUNTER — Other Ambulatory Visit: Payer: Self-pay

## 2019-05-10 ENCOUNTER — Encounter: Payer: Self-pay | Admitting: Gastroenterology

## 2019-05-10 ENCOUNTER — Ambulatory Visit (INDEPENDENT_AMBULATORY_CARE_PROVIDER_SITE_OTHER): Payer: Medicare HMO | Admitting: Gastroenterology

## 2019-05-10 VITALS — BP 159/80 | HR 60 | Temp 96.6°F | Ht 65.0 in | Wt 261.0 lb

## 2019-05-10 DIAGNOSIS — K5900 Constipation, unspecified: Secondary | ICD-10-CM | POA: Diagnosis not present

## 2019-05-10 DIAGNOSIS — K7460 Unspecified cirrhosis of liver: Secondary | ICD-10-CM

## 2019-05-10 DIAGNOSIS — K219 Gastro-esophageal reflux disease without esophagitis: Secondary | ICD-10-CM | POA: Diagnosis not present

## 2019-05-10 DIAGNOSIS — D126 Benign neoplasm of colon, unspecified: Secondary | ICD-10-CM | POA: Diagnosis not present

## 2019-05-10 DIAGNOSIS — R1319 Other dysphagia: Secondary | ICD-10-CM

## 2019-05-10 DIAGNOSIS — R1310 Dysphagia, unspecified: Secondary | ICD-10-CM | POA: Diagnosis not present

## 2019-05-10 MED ORDER — LINACLOTIDE 290 MCG PO CAPS: 290.0000 ug | ORAL_CAPSULE | Freq: Every day | ORAL | 1 refills | Status: DC

## 2019-05-10 NOTE — Assessment & Plan Note (Addendum)
Trial of Linzess 290 mcg daily as needed.  Samples, rebate and prescription provided.

## 2019-05-10 NOTE — Assessment & Plan Note (Signed)
Overdue for surveillance colonoscopy.  Deep sedation due to polypharmacy.  I have discussed the risks, alternatives, benefits with regards to but not limited to the risk of reaction to medication, bleeding, infection, perforation and the patient is agreeable to proceed. Written consent to be obtained.  Importance of treatment of constipation especially the week prior to colonoscopy discussed at length.

## 2019-05-10 NOTE — Assessment & Plan Note (Signed)
73 year old female with history of hepatitis C cirrhosis, status post successful treatment of hepatitis C in 2015.  Labs and ultrasound back in March as outlined above.  She is due for upcoming imaging next month.  We will request copy of recent labs from PCP and let her know if she needs an additional lab work.  She needs an EGD for variceal screening.  She also history of chronic GERD, solid food dysphagia.  Plan for EGD with esophageal dilation and possible esophageal banding if able/needed.  Patient aware that she may not be able to have both esophageal dilation and banding depending on findings.  Plan for deep sedation given polypharmacy.  I have discussed the risks, alternatives, benefits with regards to but not limited to the risk of reaction to medication, bleeding, infection, perforation and the patient is agreeable to proceed. Written consent to be obtained.

## 2019-05-10 NOTE — Progress Notes (Signed)
Primary Care Physician:  Rosita Fire, MD  Primary Gastroenterologist:  Garfield Cornea, MD   Chief Complaint  Patient presents with  . Colonoscopy    R/S  . EGD    R/S    HPI:  Megan Odom is a 73 y.o. female here to reschedule EGD and colonoscopy.  She was last seen in March 2020 for abnormal labs.  She has a history of cirrhosis.  She was lost to follow-up after 2016. History of hepatitis C status post eradication ultimately with Harvoni in 2015.  Initially treated with interferon but had to be stopped due to intolerable side effects and did not achieve SVR.  Last colonoscopy November 2015 by Dr. Gala Romney, scattered left-sided diverticula, 5 mm polyp at the hepatic flexure, 1 cm pedunculated polyp at the rectosigmoid junction both removed.  Additional rectosigmoid polyp ablated.  Path revealed tubular adenomas.  Was due for surveillance colonoscopy in November 2018.History of multiple no-shows in 2016 and rescheduled procedures, office visits several times.  Last ultrasound of liver March 2020, slightly nodular hepatic margins suggestive of cirrhosis.  No hepatoma.  Last labs March 2020, HCVRNA negative, CBC/LFTs/iron are normal.  Meld 7.  Due for labs at any time.  Due for ultrasound at any time. Recent labs from PCP, have requested.   Weight up 30 pounds.  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  Stays the week in [place].  States she has been scared to have her EGD and colonoscopy due to COVID but she is ready to pursue now.   Having constipation recently.  Proximal lactulose she had without results.  Typically works are well.  Small smeary stools only. No melena, brbpr. No lower ext edema.  Urinating frequently with her diuretics.  No abdominal pain.  Frustrated with weight gain. Heartburn controlled on omerpazole once daily. H/o LPR in past, seen by ENT but no recent issues. Some strangling with swallowing. Solid food dysphagia.     Current Outpatient  Medications  Medication Sig Dispense Refill  . albuterol (ACCUNEB) 0.63 MG/3ML nebulizer solution Take 3 mLs by nebulization every 6 (six) hours as needed for wheezing.    Marland Kitchen albuterol (PROVENTIL HFA;VENTOLIN HFA) 108 (90 Base) MCG/ACT inhaler Inhale 2 puffs into the lungs every 4 (four) hours as needed for wheezing or shortness of breath. 1 Inhaler 3  . aspirin EC 81 MG tablet Take 81 mg by mouth daily.      Marland Kitchen dexamethasone (DECADRON) 4 MG tablet Take 1 tablet (4 mg total) by mouth 2 (two) times daily with a meal. 10 tablet 0  . diclofenac (VOLTAREN) 75 MG EC tablet Take 1 tablet (75 mg total) by mouth 2 (two) times daily. 10 tablet 0  . FISH OIL-CANOLA OIL-VIT D3 PO Take by mouth daily.    . fluticasone (FLONASE) 50 MCG/ACT nasal spray Place 2 sprays into the nose daily as needed for allergies.     . furosemide (LASIX) 40 MG tablet Take 40 mg by mouth daily.      Marland Kitchen gabapentin (NEURONTIN) 300 MG capsule Take 300 mg by mouth 3 (three) times daily.     Marland Kitchen losartan (COZAAR) 100 MG tablet Take 100 mg by mouth daily.    . Multiple Vitamin (MULTIVITAMIN WITH MINERALS) TABS tablet Take 1 tablet by mouth daily.    Marland Kitchen omeprazole (PRILOSEC) 40 MG capsule Take 40 mg by mouth daily.     Marland Kitchen PARoxetine (PAXIL) 20 MG tablet Take 20 mg by mouth 2 (  two) times daily.    Marland Kitchen Phenyleph-CPM-DM-Aspirin (ALKA-SELTZER PLUS COLD & COUGH PO) Take 2 capsules by mouth every 6 (six) hours as needed (cough/cold).    . potassium chloride (K-DUR) 10 MEQ tablet Take 10 mEq by mouth daily.    . prochlorperazine (COMPAZINE) 5 MG tablet Take 1 tablet (5 mg total) by mouth every 12 (twelve) hours as needed (headache). 20 tablet 0  . spironolactone (ALDACTONE) 25 MG tablet Take 1 tablet by mouth daily.    . verapamil (VERELAN PM) 180 MG 24 hr capsule Take 180 mg by mouth daily.     No current facility-administered medications for this visit.     Allergies as of 05/10/2019 - Review Complete 05/10/2019  Allergen Reaction Noted  .  Darvocet [propoxyphene n-acetaminophen] Other (See Comments) 02/26/2011  . Latex Swelling 04/24/2012  . Other Other (See Comments) 02/13/2013  . Penicillins Other (See Comments) 02/26/2011  . Tetracyclines & related Other (See Comments) 02/26/2011  . Adhesive [tape] Itching and Rash 12/04/2012    Past Medical History:  Diagnosis Date  . Arthritis   . Asthma   . Cirrhosis ([facility])   . Depression   . Hep C w/o coma, chronic ([facility])    eradicated with Harvoni 2015.   Marland Kitchen Hyperlipemia   . Hypertension   . Type 2 diabetes mellitus ([place])     Past Surgical History:  Procedure Laterality Date  . ABDOMINAL HYSTERECTOMY    . APPENDECTOMY    . BREAST SURGERY     traumatic injury  . CHOLECYSTECTOMY    . COLONOSCOPY N/A 07/03/2014   Dr. Gala Romney: 5 mm and 1 cm tubular adenomas removed, diverticulosis.  Next colonoscopy in 2018.    Family History  Problem Relation Age of Onset  . Alcohol abuse Mother   . Early death Mother   . Early death Father   . Alcohol abuse Sister   . HIV Brother   . Diabetes Brother   . Heart disease Brother   . Hepatitis C Daughter   . Hypertension Son   . Heart disease Brother   . Diabetes Brother   . Cancer Maternal Aunt        stomach  . Hypertension Maternal Aunt   . Heart disease Maternal Aunt   . Cancer Maternal Uncle        prostate  . Hypertension Maternal Uncle   . Colon cancer Neg Hx     Social History   Socioeconomic History  . Marital status: Widowed    Spouse name: Not on file  . Number of children: Not on file  . Years of education: GED  . Highest education level: Not on file  Occupational History  . 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 Financial resource strain: Not on file  . Food insecurity    Worry: Not on file    Inability: Not on file  . Transportation needs    Medical: Not on file    Non-medical: Not on file  Tobacco Use  . Smoking status: Current Every Day Smoker    Packs/day: 0.50    Years: 50.00     Pack years: 25.00  . Smokeless tobacco: Never Used  . Tobacco comment: one pack every 2-3 days  Substance and Sexual Activity  . Alcohol use: No    Alcohol/week: 0.0 standard drinks  . Drug use: No  . Sexual activity: Not Currently    Birth control/protection: Surgical  Lifestyle  . Physical activity  Days per week: Not on file    Minutes per session: Not on file  . Stress: Not on file  Relationships  . Social Herbalist on phone: Not on file    Gets together: Not on file    Attends religious service: Not on file    Active member of club or organization: Not on file    Attends meetings of clubs or organizations: Not on file    Relationship status: Not on file  . Intimate partner violence    Fear of current or ex partner: Not on file    Emotionally abused: Not on file    Physically abused: Not on file    Forced sexual activity: Not on file  Other Topics Concern  . Not on file  Social History Narrative  . Not on file      ROS:  General: Negative for anorexia, weight loss, fever, chills, fatigue, weakness. Eyes: Negative for vision changes.  ENT: Negative for hoarseness, nasal congestion.  See HPI CV: Negative for chest pain, angina, palpitations, dyspnea on exertion, peripheral edema.  Respiratory: Negative for dyspnea at rest, dyspnea on exertion, cough, sputum, positive wheezing.  GI: See history of present illness. GU:  Negative for dysuria, hematuria, urinary incontinence, urinary frequency, nocturnal urination.  MS: Negative for joint pain, low back pain.  Derm: Negative for rash or itching.  Neuro: Negative for weakness, abnormal sensation, seizure, frequent headaches, memory loss, confusion.  Psych: Negative for anxiety, depression, suicidal ideation, hallucinations.  Endo: Negative for unusual weight change.  Heme: Negative for bruising or bleeding. Allergy: Negative for rash or hives.    Physical Examination:  BP (!) 159/80   Pulse 60    Temp (!) 96.6 F (35.9 C) (Oral)   Ht 5\' 5"  (1.651 m)   Wt 261 lb (118.4 kg)   BMI 43.43 kg/m    General: Well-nourished, well-developed in no acute distress.  Head: Normocephalic, atraumatic.   Eyes: Conjunctiva pink, no icterus. Mouth: Oropharyngeal mucosa moist and pink , no lesions erythema or exudate. Neck: Supple without thyromegaly, masses, or lymphadenopathy.  Lungs: Clear to auscultation bilaterally.  Heart: Regular rate and rhythm, no murmurs rubs or gallops.  Abdomen: Bowel sounds are normal, nontender, nondistended, no hepatosplenomegaly or masses, no abdominal bruits or    hernia , no rebound or guarding.  Obese no apparent ascites Rectal: Deferred Extremities: No lower extremity edema. No clubbing or deformities.  Neuro: Alert and oriented x 4 , grossly normal neurologically.  Skin: Warm and dry, no rash or jaundice.   Psych: Alert and cooperative, normal mood and affect.  Labs: Lab Results  Component Value Date   CREATININE 1.09 (H) 11/19/2018   BUN 18 11/19/2018   NA 140 11/19/2018   K 3.9 11/19/2018   CL 106 11/19/2018   CO2 25 11/19/2018   Lab Results  Component Value Date   ALT 11 11/19/2018   AST 16 11/19/2018   ALKPHOS 90 05/11/2018   BILITOT 0.4 11/19/2018   Lab Results  Component Value Date   WBC 9.5 11/19/2018   HGB 12.1 11/19/2018   HCT 36.4 11/19/2018   MCV 87.7 11/19/2018   PLT 294 11/19/2018   Lab Results  Component Value Date   INR 1.0 11/19/2018   INR 1.12 11/08/2014   INR 1.1 04/14/2009     Imaging Studies: Dg Foot Complete Left  Result Date: 04/26/2019 CLINICAL DATA:  Left foot pain for the past 3 weeks. No known  injury. EXAM: LEFT FOOT - COMPLETE 3+ VIEW COMPARISON:  None. FINDINGS: Diffuse osteopenia. Mild hallux valgus. Otherwise, unremarkable bones and soft tissues. IMPRESSION: Mild hallux valgus. No acute abnormality. Electronically Signed   By: Claudie Revering M.D.   On: 04/26/2019 14:44   Mm 3d Screen Breast  Bilateral  Result Date: 04/26/2019 CLINICAL DATA:  Screening. EXAM: DIGITAL SCREENING BILATERAL MAMMOGRAM WITH TOMO AND CAD COMPARISON:  Previous exam(s). ACR Breast Density Category b: There are scattered areas of fibroglandular density. FINDINGS: There are no findings suspicious for malignancy. Images were processed with CAD. IMPRESSION: No mammographic evidence of malignancy. A result letter of this screening mammogram will be mailed directly to the patient. RECOMMENDATION: Screening mammogram in one year. (Code:SM-B-01Y) BI-RADS CATEGORY  1: Negative. Electronically Signed   By: Fidela Salisbury M.D.   On: 04/26/2019 16:48

## 2019-05-10 NOTE — Patient Instructions (Addendum)
1. We will let you know if you need to get any other labs done once I see what your PCP has done. 2. Upper endoscopy and colonoscopy as scheduled. See separate instructions.  3. Start LInzess 268mcg daily as needed for constipation. Samples and rebate card provided. RX sent to pharmacy. Take rebate card with you. 4. You are due repeat liver ultrasound in near future. Our office will contact you to schedule when you are due.

## 2019-05-12 NOTE — Progress Notes (Signed)
CC'ED TO PCP 

## 2019-05-13 ENCOUNTER — Telehealth: Payer: Self-pay | Admitting: *Deleted

## 2019-05-13 DIAGNOSIS — I10 Essential (primary) hypertension: Secondary | ICD-10-CM | POA: Diagnosis not present

## 2019-05-13 DIAGNOSIS — R739 Hyperglycemia, unspecified: Secondary | ICD-10-CM | POA: Diagnosis not present

## 2019-05-13 NOTE — Telephone Encounter (Signed)
LMOVM to schedule TCS/EGD/ED/ +/-esoph banding with propofol with RMR

## 2019-05-14 NOTE — Telephone Encounter (Signed)
LMOVM letter mailed to call

## 2019-05-15 ENCOUNTER — Other Ambulatory Visit: Payer: Self-pay

## 2019-05-15 DIAGNOSIS — K7460 Unspecified cirrhosis of liver: Secondary | ICD-10-CM

## 2019-05-17 ENCOUNTER — Other Ambulatory Visit: Payer: Self-pay | Admitting: Neurology

## 2019-05-17 DIAGNOSIS — G603 Idiopathic progressive neuropathy: Secondary | ICD-10-CM | POA: Diagnosis not present

## 2019-05-17 DIAGNOSIS — M545 Low back pain: Secondary | ICD-10-CM | POA: Diagnosis not present

## 2019-05-17 DIAGNOSIS — G894 Chronic pain syndrome: Secondary | ICD-10-CM | POA: Diagnosis not present

## 2019-05-17 DIAGNOSIS — M5416 Radiculopathy, lumbar region: Secondary | ICD-10-CM

## 2019-05-17 DIAGNOSIS — M25511 Pain in right shoulder: Secondary | ICD-10-CM | POA: Diagnosis not present

## 2019-05-17 DIAGNOSIS — M542 Cervicalgia: Secondary | ICD-10-CM | POA: Diagnosis not present

## 2019-05-22 ENCOUNTER — Telehealth: Payer: Self-pay | Admitting: Internal Medicine

## 2019-05-22 NOTE — Telephone Encounter (Signed)
Recall sent 

## 2019-05-22 NOTE — Telephone Encounter (Signed)
Recall for ultrasound 

## 2019-05-24 ENCOUNTER — Ambulatory Visit
Admission: RE | Admit: 2019-05-24 | Discharge: 2019-05-24 | Disposition: A | Discharge status: Home / Self Care | Payer: Medicare HMO | Source: Ambulatory Visit | Attending: Neurology | Admitting: Neurology

## 2019-05-24 ENCOUNTER — Other Ambulatory Visit: Payer: Self-pay

## 2019-05-24 DIAGNOSIS — M545 Low back pain: Secondary | ICD-10-CM | POA: Diagnosis not present

## 2019-05-24 DIAGNOSIS — M5416 Radiculopathy, lumbar region: Secondary | ICD-10-CM

## 2019-05-24 MED ORDER — METHYLPREDNISOLONE ACETATE 40 MG/ML INJ SUSP (RADIOLOG
120.0000 mg | Freq: Once | INTRAMUSCULAR | Status: AC
  Administered 2019-05-24: 120 mg via EPIDURAL

## 2019-05-24 MED ORDER — IOPAMIDOL (ISOVUE-M 200) INJECTION 41%
1.0000 mL | Freq: Once | INTRAMUSCULAR | Status: AC
  Administered 2019-05-24: 1 mL via EPIDURAL

## 2019-05-24 NOTE — Discharge Instructions (Signed)

## 2019-06-03 ENCOUNTER — Telehealth: Payer: Self-pay | Admitting: Internal Medicine

## 2019-06-03 DIAGNOSIS — K7460 Unspecified cirrhosis of liver: Secondary | ICD-10-CM

## 2019-06-03 NOTE — Telephone Encounter (Signed)
Patient received a letter that it was time to schedule her RUQ U/S and prefers it to be done on a Friday. [phone]

## 2019-06-03 NOTE — Telephone Encounter (Signed)
[place] abd RUQ scheduled for 06/07/19 at 9:30am, arrive at 9:15am. NPO 6 hours prior to test. Called and informed pt of appt. Letter mailed.

## 2019-06-07 ENCOUNTER — Ambulatory Visit (HOSPITAL_COMMUNITY)
Admission: RE | Admit: 2019-06-07 | Discharge: 2019-06-07 | Disposition: A | Discharge status: Home / Self Care | Payer: Medicare HMO | Source: Ambulatory Visit | Attending: Gastroenterology | Admitting: Gastroenterology

## 2019-06-07 ENCOUNTER — Other Ambulatory Visit: Payer: Self-pay

## 2019-06-07 DIAGNOSIS — N281 Cyst of kidney, acquired: Secondary | ICD-10-CM | POA: Diagnosis not present

## 2019-06-07 DIAGNOSIS — K7460 Unspecified cirrhosis of liver: Secondary | ICD-10-CM | POA: Diagnosis not present

## 2019-06-07 DIAGNOSIS — R188 Other ascites: Secondary | ICD-10-CM | POA: Diagnosis not present

## 2019-06-08 LAB — COMPREHENSIVE METABOLIC PANEL
AG Ratio: 1 (calc) (ref 1.0–2.5)
ALT: 13 U/L (ref 6–29)
AST: 14 U/L (ref 10–35)
Albumin: 4.2 g/dL (ref 3.6–5.1)
Alkaline phosphatase (APISO): 129 U/L (ref 37–153)
BUN/Creatinine Ratio: 36 (calc) — ABNORMAL HIGH (ref 6–22)
BUN: 32 mg/dL — ABNORMAL HIGH (ref 7–25)
CO2: 21 mmol/L (ref 20–32)
Calcium: 9.5 mg/dL (ref 8.6–10.4)
Chloride: 107 mmol/L (ref 98–110)
Creat: 0.89 mg/dL (ref 0.60–0.93)
Globulin: 4.2 g/dL (calc) — ABNORMAL HIGH (ref 1.9–3.7)
Glucose, Bld: 82 mg/dL (ref 65–139)
Potassium: 4.1 mmol/L (ref 3.5–5.3)
Sodium: 136 mmol/L (ref 135–146)
Total Bilirubin: 0.4 mg/dL (ref 0.2–1.2)
Total Protein: 8.4 g/dL — ABNORMAL HIGH (ref 6.1–8.1)

## 2019-06-08 LAB — CBC WITH DIFFERENTIAL/PLATELET
Absolute Monocytes: 1283 cells/uL — ABNORMAL HIGH (ref 200–950)
Basophils Absolute: 54 cells/uL (ref 0–200)
Basophils Relative: 0.4 %
Eosinophils Absolute: 81 cells/uL (ref 15–500)
Eosinophils Relative: 0.6 %
HCT: 40.3 % (ref 35.0–45.0)
Hemoglobin: 13.1 g/dL (ref 11.7–15.5)
Lymphs Abs: 2579 cells/uL (ref 850–3900)
MCH: 28.1 pg (ref 27.0–33.0)
MCHC: 32.5 g/dL (ref 32.0–36.0)
MCV: 86.3 fL (ref 80.0–100.0)
MPV: 12.3 fL (ref 7.5–12.5)
Monocytes Relative: 9.5 %
Neutro Abs: 9504 cells/uL — ABNORMAL HIGH (ref 1500–7800)
Neutrophils Relative %: 70.4 %
Platelets: 362 10*3/uL (ref 140–400)
RBC: 4.67 10*6/uL (ref 3.80–5.10)
RDW: 12.3 % (ref 11.0–15.0)
Total Lymphocyte: 19.1 %
WBC: 13.5 10*3/uL — ABNORMAL HIGH (ref 3.8–10.8)

## 2019-06-08 LAB — PROTIME-INR
INR: 1
Prothrombin Time: 10.6 s (ref 9.0–11.5)

## 2019-06-10 DIAGNOSIS — J449 Chronic obstructive pulmonary disease, unspecified: Secondary | ICD-10-CM | POA: Diagnosis not present

## 2019-06-10 DIAGNOSIS — F331 Major depressive disorder, recurrent, moderate: Secondary | ICD-10-CM | POA: Diagnosis not present

## 2019-06-25 ENCOUNTER — Telehealth: Payer: Self-pay | Admitting: Gastroenterology

## 2019-06-25 ENCOUNTER — Other Ambulatory Visit: Payer: Self-pay

## 2019-06-25 DIAGNOSIS — R1310 Dysphagia, unspecified: Secondary | ICD-10-CM

## 2019-06-25 DIAGNOSIS — K7460 Unspecified cirrhosis of liver: Secondary | ICD-10-CM

## 2019-06-25 DIAGNOSIS — K219 Gastro-esophageal reflux disease without esophagitis: Secondary | ICD-10-CM

## 2019-06-25 DIAGNOSIS — D126 Benign neoplasm of colon, unspecified: Secondary | ICD-10-CM

## 2019-06-25 DIAGNOSIS — R1319 Other dysphagia: Secondary | ICD-10-CM

## 2019-06-25 NOTE — Telephone Encounter (Signed)
Patient still needs TCS/EGD/ED+/-esoph banding with propofol with RMR per orders given 05/10/19.

## 2019-06-25 NOTE — Telephone Encounter (Signed)
Pt called office, TCS/EGD/DIL/-/+Esoph banding w/Propofol w/RMR scheduled for 10/03/19 at 12:00pm. Pt already has prep. Orders entered.

## 2019-06-25 NOTE — Telephone Encounter (Signed)
Pre-op appt 10/01/19 at 12:45pm, COVID test 1:45pm. Appt letter mailed with procedure instructions.

## 2019-06-25 NOTE — Telephone Encounter (Signed)
ME tried to call pt 05/13/19 and 05/14/19. Letter was mailed 05/14/19 for pt to call office to schedule procedure.  Tried to call pt this am, no answer, LMOVM for return call.

## 2019-06-25 NOTE — Telephone Encounter (Signed)
PA for EGD submitted via [employer] website. Case approved. [employer]# [id number], valid 10/03/19-11/02/19.

## 2019-06-28 ENCOUNTER — Telehealth: Payer: Self-pay

## 2019-06-28 DIAGNOSIS — M25511 Pain in right shoulder: Secondary | ICD-10-CM | POA: Diagnosis not present

## 2019-06-28 DIAGNOSIS — M542 Cervicalgia: Secondary | ICD-10-CM | POA: Diagnosis not present

## 2019-06-28 DIAGNOSIS — M545 Low back pain: Secondary | ICD-10-CM | POA: Diagnosis not present

## 2019-06-28 DIAGNOSIS — G603 Idiopathic progressive neuropathy: Secondary | ICD-10-CM | POA: Diagnosis not present

## 2019-06-28 NOTE — Telephone Encounter (Signed)
Pt called office, informed of procedure date and instructions mailed. Verbalized understanding.

## 2019-06-28 NOTE — Telephone Encounter (Signed)
MB, please call pt. Pt says she took off a week to have her TCS and she thought it was this week coming up. Please advise on when apt is. [phone]

## 2019-06-28 NOTE — Telephone Encounter (Signed)
Tried to call pt, no answer, LMOVM to inform pt her procedure is 10/03/19. Packet has been mailed.

## 2019-07-05 ENCOUNTER — Other Ambulatory Visit: Payer: Self-pay | Admitting: *Deleted

## 2019-07-05 ENCOUNTER — Other Ambulatory Visit: Payer: Self-pay

## 2019-07-05 ENCOUNTER — Other Ambulatory Visit (HOSPITAL_COMMUNITY)
Admission: RE | Admit: 2019-07-05 | Discharge: 2019-07-05 | Disposition: A | Discharge status: Home / Self Care | Payer: Medicare HMO | Source: Ambulatory Visit | Attending: Adult Health | Admitting: Adult Health

## 2019-07-05 ENCOUNTER — Ambulatory Visit: Payer: Medicare HMO | Admitting: Adult Health

## 2019-07-05 ENCOUNTER — Encounter: Payer: Self-pay | Admitting: Adult Health

## 2019-07-05 VITALS — BP 179/84 | HR 61 | Ht 65.5 in | Wt 257.2 lb

## 2019-07-05 DIAGNOSIS — Z20828 Contact with and (suspected) exposure to other viral communicable diseases: Secondary | ICD-10-CM | POA: Diagnosis not present

## 2019-07-05 DIAGNOSIS — Z1212 Encounter for screening for malignant neoplasm of rectum: Secondary | ICD-10-CM | POA: Diagnosis not present

## 2019-07-05 DIAGNOSIS — Z01419 Encounter for gynecological examination (general) (routine) without abnormal findings: Secondary | ICD-10-CM | POA: Diagnosis not present

## 2019-07-05 DIAGNOSIS — R062 Wheezing: Secondary | ICD-10-CM

## 2019-07-05 DIAGNOSIS — Z1272 Encounter for screening for malignant neoplasm of vagina: Secondary | ICD-10-CM | POA: Insufficient documentation

## 2019-07-05 DIAGNOSIS — Z1211 Encounter for screening for malignant neoplasm of colon: Secondary | ICD-10-CM | POA: Diagnosis not present

## 2019-07-05 DIAGNOSIS — Z20822 Contact with and (suspected) exposure to covid-19: Secondary | ICD-10-CM

## 2019-07-05 DIAGNOSIS — Z1151 Encounter for screening for human papillomavirus (HPV): Secondary | ICD-10-CM | POA: Diagnosis not present

## 2019-07-05 DIAGNOSIS — Z90710 Acquired absence of both cervix and uterus: Secondary | ICD-10-CM | POA: Diagnosis not present

## 2019-07-05 LAB — HEMOCCULT GUIAC POC 1CARD (OFFICE): Fecal Occult Blood, POC: NEGATIVE

## 2019-07-05 MED ORDER — ALBUTEROL SULFATE HFA 108 (90 BASE) MCG/ACT IN AERS: 2.0000 | INHALATION_SPRAY | RESPIRATORY_TRACT | 0 refills | Status: DC | PRN

## 2019-07-05 NOTE — Progress Notes (Addendum)
Patient ID: Megan Odom, female   DOB: May 15, 1946, 73 y.o.   MRN: [id number] History of Present Illness: Megan Odom is a 73 year old black female, in for a pelvic and pap.She is sp hysterectomy but still wants a pap.  PCP is Dr Legrand Rams.   Current Medications, Allergies, Past Medical History, Past Surgical History, Family History and Social History were reviewed in [employer] record.     Review of Systems:  Patient denies any headaches, hearing loss, fatigue, blurred vision, shortness of breath, chest pain, abdominal pain, problems with bowel movements, urination, or intercourse(not having sex). No joint pain or mood swings.   Physical Exam:BP (!) 179/84 (BP Location: Left Arm, Patient Position: Sitting, Cuff Size: Normal)   Pulse 61   Ht 5' 5.5" (1.664 m)   Wt 257 lb 3.2 oz (116.7 kg)   BMI 42.15 kg/m  General:  Well developed, well nourished, no acute distress Skin:  Warm and dry Neck:  Midline trachea, normal thyroid, good ROM, no lymphadenopathy,no carotid bruits heard Lungs; +exp. Wheezing upper fields both lungs  Cardiovascular: Regular rate and rhyth Pelvic:  External genitalia is normal in appearance, no lesions.  The vagina is pale, with loss of moisture and rugae, vaginal pap performed with HPV 16/18 genotyping at her request. Urethra has no lesions or masses. The cervix and uterus are absent.No adnexal masses or tenderness noted.Bladder is non tender, no masses felt. Rectal: Good sphincter tone, no polyps, or hemorrhoids felt.  Hemoccult negative. Psych:  No mood changes, alert and cooperative,seems happy Fall risk is low PHQ 2 score is 0. Eo exam with Weyman Croon FNP student.  Impression and Plan: 1. Smear, vaginal, as part of routine gynecological examination Pap sent  Physical and labs with PCP Mammogram yearly  2. Screening for colorectal cancer -colonoscopy per GI  3. Wheezing on expiration Refilled inhaler Meds ordered this encounter   Medications  . albuterol (VENTOLIN HFA) 108 (90 Base) MCG/ACT inhaler    Sig: Inhale 2 puffs into the lungs every 4 (four) hours as needed for wheezing or shortness of breath.    Dispense:  18 g    Refill:  0    Further refills from Dr Legrand Rams    Order Specific Question:   Supervising Provider    Answer:   Florian Buff [[id number]]  Follow up with Dr Legrand Rams

## 2019-07-05 NOTE — Addendum Note (Signed)
Addended by: Christiana Pellant A on: 07/05/2019 11:16 AM   Modules accepted: Orders

## 2019-07-08 LAB — NOVEL CORONAVIRUS, NAA: SARS-CoV-2, NAA: NOT DETECTED

## 2019-07-11 LAB — CYTOLOGY - PAP
Comment: NEGATIVE
Diagnosis: NEGATIVE
Diagnosis: REACTIVE
High risk HPV: NEGATIVE

## 2019-07-13 DIAGNOSIS — R739 Hyperglycemia, unspecified: Secondary | ICD-10-CM | POA: Diagnosis not present

## 2019-07-13 DIAGNOSIS — I10 Essential (primary) hypertension: Secondary | ICD-10-CM | POA: Diagnosis not present

## 2019-07-16 DIAGNOSIS — Z20828 Contact with and (suspected) exposure to other viral communicable diseases: Secondary | ICD-10-CM | POA: Diagnosis not present

## 2019-07-26 DIAGNOSIS — M25511 Pain in right shoulder: Secondary | ICD-10-CM | POA: Diagnosis not present

## 2019-07-26 DIAGNOSIS — M545 Low back pain: Secondary | ICD-10-CM | POA: Diagnosis not present

## 2019-07-26 DIAGNOSIS — M542 Cervicalgia: Secondary | ICD-10-CM | POA: Diagnosis not present

## 2019-07-26 DIAGNOSIS — G603 Idiopathic progressive neuropathy: Secondary | ICD-10-CM | POA: Diagnosis not present

## 2019-07-29 DIAGNOSIS — Z20828 Contact with and (suspected) exposure to other viral communicable diseases: Secondary | ICD-10-CM | POA: Diagnosis not present

## 2019-07-31 ENCOUNTER — Other Ambulatory Visit: Payer: Self-pay | Admitting: Neurology

## 2019-07-31 ENCOUNTER — Other Ambulatory Visit (HOSPITAL_COMMUNITY): Payer: Self-pay | Admitting: Neurology

## 2019-07-31 DIAGNOSIS — M545 Low back pain, unspecified: Secondary | ICD-10-CM

## 2019-07-31 DIAGNOSIS — M5416 Radiculopathy, lumbar region: Secondary | ICD-10-CM

## 2019-08-12 DIAGNOSIS — F331 Major depressive disorder, recurrent, moderate: Secondary | ICD-10-CM | POA: Diagnosis not present

## 2019-08-12 DIAGNOSIS — R739 Hyperglycemia, unspecified: Secondary | ICD-10-CM | POA: Diagnosis not present

## 2019-08-13 ENCOUNTER — Ambulatory Visit (HOSPITAL_COMMUNITY)
Admission: RE | Admit: 2019-08-13 | Discharge: 2019-08-13 | Disposition: A | Discharge status: Home / Self Care | Payer: Medicare HMO | Source: Ambulatory Visit | Attending: Neurology | Admitting: Neurology

## 2019-08-13 ENCOUNTER — Encounter (HOSPITAL_COMMUNITY): Payer: Self-pay

## 2019-08-13 ENCOUNTER — Ambulatory Visit (HOSPITAL_COMMUNITY): Payer: Medicare HMO

## 2019-08-13 ENCOUNTER — Other Ambulatory Visit: Payer: Self-pay

## 2019-08-13 DIAGNOSIS — M545 Low back pain, unspecified: Secondary | ICD-10-CM

## 2019-08-13 DIAGNOSIS — M5416 Radiculopathy, lumbar region: Secondary | ICD-10-CM | POA: Diagnosis not present

## 2019-08-22 DIAGNOSIS — Z20828 Contact with and (suspected) exposure to other viral communicable diseases: Secondary | ICD-10-CM | POA: Diagnosis not present

## 2019-08-30 ENCOUNTER — Ambulatory Visit (HOSPITAL_COMMUNITY): Payer: Medicare HMO

## 2019-09-04 DIAGNOSIS — Z20828 Contact with and (suspected) exposure to other viral communicable diseases: Secondary | ICD-10-CM | POA: Diagnosis not present

## 2019-09-10 DIAGNOSIS — Z20828 Contact with and (suspected) exposure to other viral communicable diseases: Secondary | ICD-10-CM | POA: Diagnosis not present

## 2019-09-12 ENCOUNTER — Telehealth: Payer: Self-pay | Admitting: Internal Medicine

## 2019-09-12 ENCOUNTER — Encounter: Payer: Self-pay | Admitting: *Deleted

## 2019-09-12 NOTE — Telephone Encounter (Signed)
Called patient. She states she needs to r/s her procedure bc she is getting her 2nd covid vaccine this day. I advised if her vaccine could be done after procedure but stated no she needs it this day. She prefers to r/s her procedure. Patient scheduled for next available 4/1 at 10:00am. Patient aware will mail new prep instructions with new pre-op/covid test. Endo aware of appt change.

## 2019-09-12 NOTE — Telephone Encounter (Signed)
[phone]  PATIENT CALLED STATING THAT THEY ARE GIVING HER A COVID VACCINE AND SHE IS TO GO FOR THE SECOND SHOT THE DAY OF HER PROCEDURE.  SHE WANTS TO KNOW IF HER PROCEDURE CAN BE MOVED OUT.  SHE IS WORRIED ABOUT THE CONFLICT

## 2019-09-13 DIAGNOSIS — J449 Chronic obstructive pulmonary disease, unspecified: Secondary | ICD-10-CM | POA: Diagnosis not present

## 2019-09-13 DIAGNOSIS — I10 Essential (primary) hypertension: Secondary | ICD-10-CM | POA: Diagnosis not present

## 2019-09-13 DIAGNOSIS — F331 Major depressive disorder, recurrent, moderate: Secondary | ICD-10-CM | POA: Diagnosis not present

## 2019-09-18 DIAGNOSIS — M545 Low back pain: Secondary | ICD-10-CM | POA: Diagnosis not present

## 2019-09-18 DIAGNOSIS — Z20828 Contact with and (suspected) exposure to other viral communicable diseases: Secondary | ICD-10-CM | POA: Diagnosis not present

## 2019-09-20 DIAGNOSIS — M25511 Pain in right shoulder: Secondary | ICD-10-CM | POA: Diagnosis not present

## 2019-09-20 DIAGNOSIS — M545 Low back pain: Secondary | ICD-10-CM | POA: Diagnosis not present

## 2019-09-20 DIAGNOSIS — G603 Idiopathic progressive neuropathy: Secondary | ICD-10-CM | POA: Diagnosis not present

## 2019-09-20 DIAGNOSIS — M542 Cervicalgia: Secondary | ICD-10-CM | POA: Diagnosis not present

## 2019-09-23 ENCOUNTER — Other Ambulatory Visit: Payer: Self-pay

## 2019-09-23 ENCOUNTER — Inpatient Hospital Stay (HOSPITAL_COMMUNITY): Payer: Medicare HMO | Attending: Hematology | Admitting: Hematology

## 2019-09-23 ENCOUNTER — Inpatient Hospital Stay (HOSPITAL_COMMUNITY): Payer: Medicare HMO

## 2019-09-23 ENCOUNTER — Encounter (HOSPITAL_COMMUNITY): Payer: Self-pay | Admitting: Hematology

## 2019-09-23 DIAGNOSIS — E876 Hypokalemia: Secondary | ICD-10-CM | POA: Insufficient documentation

## 2019-09-23 DIAGNOSIS — M545 Low back pain: Secondary | ICD-10-CM

## 2019-09-23 DIAGNOSIS — Z806 Family history of leukemia: Secondary | ICD-10-CM

## 2019-09-23 DIAGNOSIS — F17210 Nicotine dependence, cigarettes, uncomplicated: Secondary | ICD-10-CM | POA: Diagnosis not present

## 2019-09-23 DIAGNOSIS — Z800 Family history of malignant neoplasm of digestive organs: Secondary | ICD-10-CM | POA: Insufficient documentation

## 2019-09-23 DIAGNOSIS — K5909 Other constipation: Secondary | ICD-10-CM | POA: Diagnosis not present

## 2019-09-23 DIAGNOSIS — F329 Major depressive disorder, single episode, unspecified: Secondary | ICD-10-CM | POA: Diagnosis not present

## 2019-09-23 DIAGNOSIS — R779 Abnormality of plasma protein, unspecified: Secondary | ICD-10-CM

## 2019-09-23 DIAGNOSIS — Z8042 Family history of malignant neoplasm of prostate: Secondary | ICD-10-CM | POA: Insufficient documentation

## 2019-09-23 DIAGNOSIS — R200 Anesthesia of skin: Secondary | ICD-10-CM | POA: Diagnosis not present

## 2019-09-23 DIAGNOSIS — G8929 Other chronic pain: Secondary | ICD-10-CM | POA: Insufficient documentation

## 2019-09-23 DIAGNOSIS — M79606 Pain in leg, unspecified: Secondary | ICD-10-CM | POA: Diagnosis not present

## 2019-09-23 LAB — COMPREHENSIVE METABOLIC PANEL
ALT: 16 U/L (ref 0–44)
AST: 18 U/L (ref 15–41)
Albumin: 4 g/dL (ref 3.5–5.0)
Alkaline Phosphatase: 101 U/L (ref 38–126)
Anion gap: 10 (ref 5–15)
BUN: 17 mg/dL (ref 8–23)
CO2: 23 mmol/L (ref 22–32)
Calcium: 9.1 mg/dL (ref 8.9–10.3)
Chloride: 105 mmol/L (ref 98–111)
Creatinine, Ser: 0.92 mg/dL (ref 0.44–1.00)
GFR calc Af Amer: >60 mL/min (ref >60)
GFR calc non Af Amer: >60 mL/min (ref >60)
Glucose, Bld: 130 mg/dL — ABNORMAL HIGH (ref 70–99)
Potassium: 3 mmol/L — ABNORMAL LOW (ref 3.5–5.1)
Sodium: 138 mmol/L (ref 135–145)
Total Bilirubin: 0.5 mg/dL (ref 0.3–1.2)
Total Protein: 9 g/dL — ABNORMAL HIGH (ref 6.5–8.1)

## 2019-09-23 LAB — CBC WITH DIFFERENTIAL/PLATELET
Abs Immature Granulocytes: 0.04 10*3/uL (ref 0.00–0.07)
Basophils Absolute: 0 10*3/uL (ref 0.0–0.1)
Basophils Relative: 0 %
Eosinophils Absolute: 0.1 10*3/uL (ref 0.0–0.5)
Eosinophils Relative: 1 %
HCT: 41.8 % (ref 36.0–46.0)
Hemoglobin: 13.2 g/dL (ref 12.0–15.0)
Immature Granulocytes: 0 %
Lymphocytes Relative: 27 %
Lymphs Abs: 2.4 10*3/uL (ref 0.7–4.0)
MCH: 28.6 pg (ref 26.0–34.0)
MCHC: 31.6 g/dL (ref 30.0–36.0)
MCV: 90.5 fL (ref 80.0–100.0)
Monocytes Absolute: 0.7 10*3/uL (ref 0.1–1.0)
Monocytes Relative: 8 %
Neutro Abs: 5.9 10*3/uL (ref 1.7–7.7)
Neutrophils Relative %: 64 %
Platelets: 318 10*3/uL (ref 150–400)
RBC: 4.62 MIL/uL (ref 3.87–5.11)
RDW: 12.3 % (ref 11.5–15.5)
WBC: 9.2 10*3/uL (ref 4.0–10.5)
nRBC: 0 % (ref 0.0–0.2)

## 2019-09-23 LAB — LACTATE DEHYDROGENASE: LDH: 98 U/L (ref 98–192)

## 2019-09-23 NOTE — Patient Instructions (Signed)
[place] at [facility] Discharge Instructions  You were seen today by Dr. Delton Coombes. He went over your history, family history and how you've been feeling lately. He will have blood drawn today. He will see you back in 2 weeks for follow up.   Thank you for choosing [place] at [facility] to provide your oncology and hematology care.  To afford each patient quality time with our provider, please arrive at least 15 minutes before your scheduled appointment time.   If you have a lab appointment with the [facility] please come in thru the  Main Entrance and check in at the main information desk  You need to re-schedule your appointment should you arrive 10 or more minutes late.  We strive to give you quality time with our providers, and arriving late affects you and other patients whose appointments are after yours.  Also, if you no show three or more times for appointments you may be dismissed from the clinic at the providers discretion.     Again, thank you for choosing [facility].  Our hope is that these requests will decrease the amount of time that you wait before being seen by our physicians.       _____________________________________________________________  Should you have questions after your visit to [facility], please contact our office at [phone] between the hours of 8:00 a.m. and 4:30 p.m.  Voicemails left after 4:00 p.m. will not be returned until the following business day.  For prescription refill requests, have your pharmacy contact our office and allow 72 hours.    Cancer Center Support Programs:   > Cancer Support Group  2nd Tuesday of the month 1pm-2pm, Journey Room

## 2019-09-23 NOTE — Progress Notes (Signed)
CONSULT NOTE  Patient Care Team: Rosita Fire, MD as PCP - General (Internal Medicine) Gala Romney Cristopher Estimable, MD as Consulting Physician (Gastroenterology)  CHIEF COMPLAINTS/PURPOSE OF CONSULTATION:  Elevated total protein.  HISTORY OF PRESENTING ILLNESS:  Megan Odom 75 y.o. female is seen in consultation today at the request of Dr. Legrand Rams for elevated total protein.  Blood work on 04/12/2019 showed total protein of 8.8.  Creatinine was 0.92.  Calcium was 9.7.  CBC showed normal hemoglobin of 12.6 and hematocrit of 38.  Subsequently SPEP was ordered on 04/19/2019.  This showed 1 or more serum protein fractions outside the normal range.  She reports some back pain with radiation down the right leg for a while.  She thinks it is more than a year.  She denies any fevers, night sweats or weight loss in the last 6 months.  No recurrent infections or hospitalizations.  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  She is current active smoker, 1 pack lasts approximately 2 and half days.  She has been smoking for 56 years.  Denies any change in cough or hemoptysis.  She lives at home with her husband.  She is able to do all her ADLs and IADLs.  Family history significant for maternal aunt with stomach cancer, uncle with colon cancer and maternal uncle with leukemia.  Denies any hospitalizations or ER visits.  Appetite is 50%.  Energy levels are 25%.  Back pain is reported as 10 out of 10.  Chronic numbness in the hands and feet has been stable.  MEDICAL HISTORY:  Past Medical History:  Diagnosis Date  . Arthritis   . Asthma   . Cirrhosis ([place])   . Depression   . Hep C w/o coma, chronic ([place])    eradicated with Harvoni 2015.   Marland Kitchen Hyperlipemia   . Hypertension     SURGICAL HISTORY: Past Surgical History:  Procedure Laterality Date  . ABDOMINAL HYSTERECTOMY    . APPENDECTOMY    . BREAST SURGERY     traumatic injury  . CHOLECYSTECTOMY    . COLONOSCOPY N/A 07/03/2014   Dr. Gala Romney: 5 mm and 1  cm tubular adenomas removed, diverticulosis.  Next colonoscopy in 2018.    SOCIAL HISTORY: Social History   Socioeconomic History  . Marital status: Widowed    Spouse name: Not on file  . Number of children: 2  . Years of education: GED  . Highest education level: Not on file  Occupational History  . 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 Smoking status: Current Every Day Smoker    Packs/day: 0.50    Years: 50.00    Pack years: 25.00  . Smokeless tobacco: Never Used  . Tobacco comment: one pack every 2-3 days  Substance and Sexual Activity  . Alcohol use: No    Alcohol/week: 0.0 standard drinks  . Drug use: No  . Sexual activity: Not Currently    Birth control/protection: Surgical    Comment: hyst  Other Topics Concern  . Not on file  Social History Narrative  . Not on file   Social Determinants of Health   Financial Resource Strain: High Risk  . Difficulty of Paying Living Expenses: Hard  Food Insecurity: No Food Insecurity  . Worried About Charity fundraiser in the Last Year: Never true  . Ran Out of Food in the Last Year: Never true  Transportation Needs: No Transportation Needs  . Lack of  Transportation (Medical): No  . Lack of Transportation (Non-Medical): No  Physical Activity: Inactive  . Days of Exercise per Week: 0 days  . Minutes of Exercise per Session: 0 min  Stress: Stress Concern Present  . Feeling of Stress : To some extent  Social Connections: Somewhat Isolated  . Frequency of Communication with Friends and Family: Three times a week  . Frequency of Social Gatherings with Friends and Family: Three times a week  . Attends Religious Services: More than 4 times per year  . Active Member of Clubs or Organizations: No  . Attends Archivist Meetings: Never  . Marital Status: Widowed  Intimate Partner Violence: Not At Risk  . Fear of Current or Ex-Partner: No  . Emotionally Abused: No  . Physically Abused: No  .  Sexually Abused: No    FAMILY HISTORY: Family History  Problem Relation Age of Onset  . Alcohol abuse Mother   . Early death Mother   . Early death Father   . Alcohol abuse Sister   . HIV Brother   . Diabetes Brother   . Heart disease Brother   . Hepatitis C Daughter   . Hypertension Son   . Heart disease Brother   . Diabetes Brother   . Cancer Maternal Aunt        stomach  . Hypertension Maternal Aunt   . Heart disease Maternal Aunt   . Cancer Maternal Uncle        prostate  . Hypertension Maternal Uncle   . Colon cancer Neg Hx     ALLERGIES:  is allergic to darvocet [propoxyphene n-acetaminophen]; latex; other; penicillins; tetracyclines & related; and adhesive [tape].  MEDICATIONS:  Current Outpatient Medications  Medication Sig Dispense Refill  . aspirin EC 81 MG tablet Take 81 mg by mouth daily.      . clotrimazole-betamethasone (LOTRISONE) cream Apply 1 application topically 2 (two) times daily.     Marland Kitchen dexamethasone (DECADRON) 4 MG tablet Take 1 tablet (4 mg total) by mouth 2 (two) times daily with a meal. 10 tablet 0  . diclofenac (VOLTAREN) 75 MG EC tablet Take 1 tablet (75 mg total) by mouth 2 (two) times daily. 10 tablet 0  . DULoxetine (CYMBALTA) 30 MG capsule Take 30 mg by mouth daily.    Marland Kitchen FISH OIL-CANOLA OIL-VIT D3 PO Take by mouth daily.    . furosemide (LASIX) 40 MG tablet Take 40 mg by mouth daily.      Marland Kitchen gabapentin (NEURONTIN) 300 MG capsule Take 300 mg by mouth 3 (three) times daily.     Marland Kitchen HYDROcodone-acetaminophen (NORCO) 7.5-325 MG tablet Take 1-2 tablets by mouth daily as needed.    Marland Kitchen ketoconazole (NIZORAL) 2 % cream Apply 1 application topically daily.     Marland Kitchen linaclotide (LINZESS) 290 MCG CAPS capsule Take 1 capsule (290 mcg total) by mouth daily before breakfast. 30 capsule 1  . losartan (COZAAR) 100 MG tablet Take 100 mg by mouth daily.    . Multiple Vitamin (MULTIVITAMIN WITH MINERALS) TABS tablet Take 1 tablet by mouth daily.    Marland Kitchen omeprazole  (PRILOSEC) 40 MG capsule Take 40 mg by mouth daily.     . potassium chloride (K-DUR) 10 MEQ tablet Take 40 mEq by mouth daily.     Marland Kitchen spironolactone (ALDACTONE) 25 MG tablet Take 1 tablet by mouth daily.    . verapamil (VERELAN PM) 180 MG 24 hr capsule Take 180 mg by mouth daily.    Marland Kitchen  albuterol (VENTOLIN HFA) 108 (90 Base) MCG/ACT inhaler Inhale 2 puffs into the lungs every 4 (four) hours as needed for wheezing or shortness of breath. (Patient not taking: Reported on 09/23/2019) 18 g 0  . DULoxetine (CYMBALTA) 60 MG capsule     . fluticasone (FLONASE) 50 MCG/ACT nasal spray Place 2 sprays into the nose daily as needed for allergies.     Marland Kitchen ipratropium-albuterol (DUONEB) 0.5-2.5 (3) MG/3ML SOLN SMARTSIG:1 Vial(s) Via Nebulizer 4 Times Daily PRN    . Phenyleph-CPM-DM-Aspirin (ALKA-SELTZER PLUS COLD & COUGH PO) Take 2 capsules by mouth every 6 (six) hours as needed (cough/cold).     No current facility-administered medications for this visit.    REVIEW OF SYSTEMS:   Constitutional: Denies fevers, chills or abnormal night sweats Eyes: Denies blurriness of vision, double vision or watery eyes Ears, nose, mouth, throat, and face: Denies mucositis or sore throat Respiratory: Denies cough, dyspnea or wheezes Cardiovascular: Denies palpitation, chest discomfort or lower extremity swelling Gastrointestinal:  Denies nausea, heartburn or change in bowel habits Skin: Denies abnormal skin rashes Lymphatics: Denies new lymphadenopathy or easy bruising Neurological:Denies numbness, tingling or new weaknesses Behavioral/Psych: Mood is stable, no new changes  All other systems were reviewed with the patient and are negative.  PHYSICAL EXAMINATION: ECOG PERFORMANCE STATUS: 0 - Asymptomatic  Vitals:   09/23/19 1412  BP: (!) 160/78  Pulse: 67  Resp: 18  Temp: (!) 96.9 F (36.1 C)  SpO2: 98%   Filed Weights   09/23/19 1412  Weight: 251 lb 4.8 oz (114 kg)    GENERAL:alert, no distress and  comfortable SKIN: skin color, texture, turgor are normal, no rashes or significant lesions EYES: normal, conjunctiva are pink and non-injected, sclera clear OROPHARYNX:no exudate, no erythema and lips, buccal mucosa, and tongue normal  NECK: supple, thyroid normal size, non-tender, without nodularity LYMPH:  no palpable lymphadenopathy in the cervical, axillary or inguinal LUNGS: clear to auscultation and percussion with normal breathing effort HEART: regular rate & rhythm and no murmurs and no lower extremity edema ABDOMEN:abdomen soft, non-tender and normal bowel sounds Musculoskeletal:no cyanosis of digits and no clubbing  PSYCH: alert & oriented x 3 with fluent speech NEURO: no focal motor/sensory deficits  LABORATORY DATA:  I have reviewed the data as listed Recent Results (from the past 2160 hour(s))  Novel Coronavirus, NAA ([facility])     Status: None   Collection Time: 07/05/19 12:00 AM   Specimen: Oropharyngeal(OP) collection in vial transport medium   OROPHARYNGEA  TESTING  Result Value Ref Range   SARS-CoV-2, NAA Not Detected Not Detected    Comment: This nucleic acid amplification test was developed and its performance characteristics determined by [employer]. Nucleic acid amplification tests include PCR and TMA. This test has not been FDA cleared or approved. This test has been authorized by FDA under an Emergency Use Authorization (EUA). This test is only authorized for the duration of time the declaration that circumstances exist justifying the authorization of the emergency use of in vitro diagnostic tests for detection of SARS-CoV-2 virus and/or diagnosis of COVID-19 infection under section 564(b)(1) of the Act, 21 U.S.C. PT:2852782) (1), unless the authorization is terminated or revoked sooner. When diagnostic testing is negative, the possibility of a false negative result should be considered in the context of a patient's recent exposures and the presence  of clinical signs and symptoms consistent with COVID-19. An individual without symptoms of COVID-19 and who is not shedding SARS-CoV-2 virus would  expect to have  a negative (not detected) result in this assay.   POCT occult blood stool     Status: None   Collection Time: 07/05/19 10:39 AM  Result Value Ref Range   Fecal Occult Blood, POC Negative Negative   Card #1 Date     Card #2 Fecal Occult Blod, POC     Card #2 Date     Card #3 Fecal Occult Blood, POC     Card #3 Date    Cytology - PAP( [facility])     Status: None   Collection Time: 07/05/19 11:16 AM  Result Value Ref Range   High risk HPV Negative    Adequacy      Satisfactory for evaluation; transformation zone component PRESENT.   Diagnosis      - Negative for Intraepithelial Lesions or Malignancy (NILM)   Diagnosis - Benign reactive/reparative changes    Comment Normal Reference Range HPV - Negative   CBC with Differential/Platelet     Status: None   Collection Time: 09/23/19  3:01 PM  Result Value Ref Range   WBC 9.2 4.0 - 10.5 K/uL   RBC 4.62 3.87 - 5.11 MIL/uL   Hemoglobin 13.2 12.0 - 15.0 g/dL   HCT 41.8 36.0 - 46.0 %   MCV 90.5 80.0 - 100.0 fL   MCH 28.6 26.0 - 34.0 pg   MCHC 31.6 30.0 - 36.0 g/dL   RDW 12.3 11.5 - 15.5 %   Platelets 318 150 - 400 K/uL   nRBC 0.0 0.0 - 0.2 %   Neutrophils Relative % 64 %   Neutro Abs 5.9 1.7 - 7.7 K/uL   Lymphocytes Relative 27 %   Lymphs Abs 2.4 0.7 - 4.0 K/uL   Monocytes Relative 8 %   Monocytes Absolute 0.7 0.1 - 1.0 K/uL   Eosinophils Relative 1 %   Eosinophils Absolute 0.1 0.0 - 0.5 K/uL   Basophils Relative 0 %   Basophils Absolute 0.0 0.0 - 0.1 K/uL   Immature Granulocytes 0 %   Abs Immature Granulocytes 0.04 0.00 - 0.07 K/uL    Comment: Performed at [facility], [address] [place], [place] [address]  Comprehensive metabolic panel     Status: Abnormal   Collection Time: 09/23/19  3:01 PM  Result Value Ref Range   Sodium 138 135 - 145 mmol/L    Potassium 3.0 (L) 3.5 - 5.1 mmol/L   Chloride 105 98 - 111 mmol/L   CO2 23 22 - 32 mmol/L   Glucose, Bld 130 (H) 70 - 99 mg/dL   BUN 17 8 - 23 mg/dL   Creatinine, Ser 0.92 0.44 - 1.00 mg/dL   Calcium 9.1 8.9 - 10.3 mg/dL   Total Protein 9.0 (H) 6.5 - 8.1 g/dL   Albumin 4.0 3.5 - 5.0 g/dL   AST 18 15 - 41 U/L   ALT 16 0 - 44 U/L   Alkaline Phosphatase 101 38 - 126 U/L   Total Bilirubin 0.5 0.3 - 1.2 mg/dL   GFR calc non Af Amer >60 >60 mL/min   GFR calc Af Amer >60 >60 mL/min   Anion gap 10 5 - 15    Comment: Performed at [facility], [address], [place], [place] [address]  Lactate dehydrogenase     Status: None   Collection Time: 09/23/19  3:01 PM  Result Value Ref Range   LDH 98 98 - 192 U/L    Comment: Performed at [facility], [address] [place],  [place] [address]    RADIOGRAPHIC STUDIES: I have personally reviewed the radiological images as listed and agreed with the findings in the report.  ASSESSMENT & PLAN:  Elevated serum protein level 1.  Elevated total protein: -Patient seen at the request of Dr. Legrand Rams for further work-up of elevated total protein. -Blood work on 04/12/2019 reviewed by me showed total protein 8.8.  Globulin was 4.8. -Serum protein electrophoresis on 04/19/2019 showed 1 or more serum protein fractions outside the normal ranges. -Patient reports back pain, radiating down the right leg for a while. -I have talked to her at length about differential diagnosis of elevated total protein including plasma cell disorders. -I have recommended further work-up with serum immunofixation, free light chains, serum protein electrophoresis, LDH and beta-2 microglobulin.  If any of these labs are abnormal, we will consider checking skeletal survey and a 24-hour urine for total protein, UPEP and urine immunofixation. -She will come back in 2 weeks to discuss results.  2.  Family history: -Maternal aunt had stomach cancer.  Maternal uncle had colon cancer.   Another maternal uncle had leukemia.  3.  Tobacco abuse: -She is a current active smoker, smokes 1 pack every 2 and half days.  She started smoking at age 22, approximately 49 years ago. -She would likely qualify for low-dose CT scan.  I will talk to her at next visit.     All questions were answered. The patient knows to call the clinic with any problems, questions or concerns.      Derek Jack, MD 09/23/19 5:23 PM

## 2019-09-23 NOTE — Assessment & Plan Note (Addendum)
1.  Elevated total protein: -Patient seen at the request of Dr. Legrand Rams for further work-up of elevated total protein. -Blood work on 04/12/2019 reviewed by me showed total protein 8.8.  Globulin was 4.8. -Serum protein electrophoresis on 04/19/2019 showed 1 or more serum protein fractions outside the normal ranges. -Patient reports back pain, radiating down the right leg for a while. -I have talked to her at length about differential diagnosis of elevated total protein including plasma cell disorders. -I have recommended further work-up with serum immunofixation, free light chains, serum protein electrophoresis, LDH and beta-2 microglobulin.  If any of these labs are abnormal, we will consider checking skeletal survey and a 24-hour urine for total protein, UPEP and urine immunofixation. -She will come back in 2 weeks to discuss results.  2.  Family history: -Maternal aunt had stomach cancer.  Maternal uncle had colon cancer.  Another maternal uncle had leukemia.  3.  Tobacco abuse: -She is a current active smoker, smokes 1 pack every 2 and half days.  She started smoking at age 67, approximately 63 years ago. -She would likely qualify for low-dose CT scan.  I will talk to her at next visit.

## 2019-09-24 DIAGNOSIS — Z20828 Contact with and (suspected) exposure to other viral communicable diseases: Secondary | ICD-10-CM | POA: Diagnosis not present

## 2019-09-24 LAB — PROTEIN ELECTROPHORESIS, SERUM
A/G Ratio: 0.7 (ref 0.7–1.7)
Albumin ELP: 3.6 g/dL (ref 2.9–4.4)
Alpha-1-Globulin: 0.3 g/dL (ref 0.0–0.4)
Alpha-2-Globulin: 1 g/dL (ref 0.4–1.0)
Beta Globulin: 1.1 g/dL (ref 0.7–1.3)
Gamma Globulin: 2.7 g/dL — ABNORMAL HIGH (ref 0.4–1.8)
Globulin, Total: 5 g/dL — ABNORMAL HIGH (ref 2.2–3.9)
Total Protein ELP: 8.6 g/dL — ABNORMAL HIGH (ref 6.0–8.5)

## 2019-09-24 LAB — BETA 2 MICROGLOBULIN, SERUM: Beta-2 Microglobulin: 3.5 mg/L — ABNORMAL HIGH (ref 0.6–2.4)

## 2019-09-24 LAB — KAPPA/LAMBDA LIGHT CHAINS
Kappa free light chain: 163.2 mg/L — ABNORMAL HIGH (ref 3.3–19.4)
Kappa, lambda light chain ratio: 3.68 — ABNORMAL HIGH (ref 0.26–1.65)
Lambda free light chains: 44.4 mg/L — ABNORMAL HIGH (ref 5.7–26.3)

## 2019-09-25 LAB — IMMUNOFIXATION ELECTROPHORESIS
IgA: 128 mg/dL (ref 64–422)
IgG (Immunoglobin G), Serum: 2963 mg/dL — ABNORMAL HIGH (ref 586–1602)
IgM (Immunoglobulin M), Srm: 48 mg/dL (ref 26–217)
Total Protein ELP: 8.5 g/dL (ref 6.0–8.5)

## 2019-10-01 ENCOUNTER — Encounter (HOSPITAL_COMMUNITY): Payer: Medicare HMO

## 2019-10-01 ENCOUNTER — Other Ambulatory Visit (HOSPITAL_COMMUNITY): Payer: Medicare HMO

## 2019-10-01 DIAGNOSIS — Z20828 Contact with and (suspected) exposure to other viral communicable diseases: Secondary | ICD-10-CM | POA: Diagnosis not present

## 2019-10-08 DIAGNOSIS — Z20828 Contact with and (suspected) exposure to other viral communicable diseases: Secondary | ICD-10-CM | POA: Diagnosis not present

## 2019-10-10 ENCOUNTER — Inpatient Hospital Stay (HOSPITAL_COMMUNITY): Payer: Medicare HMO

## 2019-10-10 ENCOUNTER — Ambulatory Visit (HOSPITAL_COMMUNITY): Payer: Medicare HMO | Admitting: Hematology

## 2019-10-11 ENCOUNTER — Other Ambulatory Visit (HOSPITAL_COMMUNITY): Payer: Self-pay | Admitting: *Deleted

## 2019-10-11 DIAGNOSIS — R779 Abnormality of plasma protein, unspecified: Secondary | ICD-10-CM

## 2019-10-14 ENCOUNTER — Inpatient Hospital Stay (HOSPITAL_BASED_OUTPATIENT_CLINIC_OR_DEPARTMENT_OTHER): Payer: Medicare HMO | Admitting: Hematology

## 2019-10-14 ENCOUNTER — Inpatient Hospital Stay (HOSPITAL_COMMUNITY): Payer: Medicare HMO

## 2019-10-14 ENCOUNTER — Other Ambulatory Visit: Payer: Self-pay

## 2019-10-14 ENCOUNTER — Encounter (HOSPITAL_COMMUNITY): Payer: Self-pay | Admitting: Hematology

## 2019-10-14 ENCOUNTER — Ambulatory Visit (HOSPITAL_COMMUNITY)
Admission: RE | Admit: 2019-10-14 | Discharge: 2019-10-14 | Disposition: A | Discharge status: Home / Self Care | Payer: Medicare HMO | Source: Ambulatory Visit | Attending: Hematology | Admitting: Hematology

## 2019-10-14 VITALS — BP 135/66 | HR 61 | Temp 97.0°F | Resp 19 | Wt 252.9 lb

## 2019-10-14 DIAGNOSIS — E876 Hypokalemia: Secondary | ICD-10-CM | POA: Diagnosis not present

## 2019-10-14 DIAGNOSIS — M170 Bilateral primary osteoarthritis of knee: Secondary | ICD-10-CM | POA: Diagnosis not present

## 2019-10-14 DIAGNOSIS — G8929 Other chronic pain: Secondary | ICD-10-CM | POA: Diagnosis not present

## 2019-10-14 DIAGNOSIS — R768 Other specified abnormal immunological findings in serum: Secondary | ICD-10-CM | POA: Insufficient documentation

## 2019-10-14 DIAGNOSIS — M545 Low back pain: Secondary | ICD-10-CM | POA: Diagnosis not present

## 2019-10-14 DIAGNOSIS — R779 Abnormality of plasma protein, unspecified: Secondary | ICD-10-CM | POA: Diagnosis not present

## 2019-10-14 DIAGNOSIS — Z800 Family history of malignant neoplasm of digestive organs: Secondary | ICD-10-CM | POA: Diagnosis not present

## 2019-10-14 DIAGNOSIS — F329 Major depressive disorder, single episode, unspecified: Secondary | ICD-10-CM | POA: Diagnosis not present

## 2019-10-14 DIAGNOSIS — F17210 Nicotine dependence, cigarettes, uncomplicated: Secondary | ICD-10-CM | POA: Diagnosis not present

## 2019-10-14 DIAGNOSIS — I10 Essential (primary) hypertension: Secondary | ICD-10-CM | POA: Diagnosis not present

## 2019-10-14 DIAGNOSIS — K5909 Other constipation: Secondary | ICD-10-CM | POA: Diagnosis not present

## 2019-10-14 DIAGNOSIS — M79606 Pain in leg, unspecified: Secondary | ICD-10-CM | POA: Diagnosis not present

## 2019-10-14 LAB — COMPREHENSIVE METABOLIC PANEL
ALT: 12 U/L (ref 0–44)
AST: 16 U/L (ref 15–41)
Albumin: 3.9 g/dL (ref 3.5–5.0)
Alkaline Phosphatase: 108 U/L (ref 38–126)
Anion gap: 9 (ref 5–15)
BUN: 17 mg/dL (ref 8–23)
CO2: 22 mmol/L (ref 22–32)
Calcium: 9.6 mg/dL (ref 8.9–10.3)
Chloride: 106 mmol/L (ref 98–111)
Creatinine, Ser: 0.96 mg/dL (ref 0.44–1.00)
GFR calc Af Amer: >60 mL/min (ref >60)
GFR calc non Af Amer: 60 mL/min — ABNORMAL LOW (ref >60)
Glucose, Bld: 116 mg/dL — ABNORMAL HIGH (ref 70–99)
Potassium: 4.7 mmol/L (ref 3.5–5.1)
Sodium: 137 mmol/L (ref 135–145)
Total Bilirubin: 0.6 mg/dL (ref 0.3–1.2)
Total Protein: 9.1 g/dL — ABNORMAL HIGH (ref 6.5–8.1)

## 2019-10-14 NOTE — Patient Instructions (Addendum)
[place] at [facility] Discharge Instructions  You were seen today by Dr. Delton Coombes. He went over your recent lab results, two of the myeloma blood tests were normal and one was elevated. He will need to do further work up to determine if you have myeloma. He will schedule you for a scan and a bone marrow biopsy. He will see you back after your biopsy for follow up.   Thank you for choosing [place] at [facility] to provide your oncology and hematology care.  To afford each patient quality time with our provider, please arrive at least 15 minutes before your scheduled appointment time.   If you have a lab appointment with the [place] [facility] please come in thru the  Main Entrance and check in at the main information desk  You need to re-schedule your appointment should you arrive 10 or more minutes late.  We strive to give you quality time with our providers, and arriving late affects you and other patients whose appointments are after yours.  Also, if you no show three or more times for appointments you may be dismissed from the clinic at the providers discretion.     Again, thank you for choosing [facility].  Our hope is that these requests will decrease the amount of time that you wait before being seen by our physicians.       _____________________________________________________________  Should you have questions after your visit to [facility], please contact our office at [phone] between the hours of 8:00 a.m. and 4:30 p.m.  Voicemails left after 4:00 p.m. will not be returned until the following business day.  For prescription refill requests, have your pharmacy contact our office and allow 72 hours.    Cancer Center Support Programs:   > Cancer Support Group  2nd Tuesday of the month 1pm-2pm, Journey Room

## 2019-10-14 NOTE — Progress Notes (Signed)
[place], [place] [address]   CLINIC:  Medical Oncology/Hematology  PCP:  Rosita Fire, MD [place] [address] [phone]   REASON FOR VISIT:  Follow-up for elevated free light chains.  CURRENT THERAPY: Work-up.   INTERVAL HISTORY:  Megan Odom 74 y.o. female seen for follow-up of elevated free light chains.  She was initially evaluated for elevated total protein at the request of Dr. Legrand Rams.  She denies any new onset bone pains.  She has chronic leg pains rated as 10 out of 10.  Appetite is 75%.  Energy levels are 25%.  Dry cough of chronic duration is present.  Also chronic constipation present.  Reports no family history of multiple myeloma or other plasma cell disorders.   REVIEW OF SYSTEMS:  Review of Systems  Respiratory: Positive for cough.   Gastrointestinal: Positive for constipation.  Psychiatric/Behavioral: Positive for depression.  All other systems reviewed and are negative.    PAST MEDICAL/SURGICAL HISTORY:  Past Medical History:  Diagnosis Date  . Arthritis   . Asthma   . Cirrhosis ([facility])   . Depression   . Hep C w/o coma, chronic ([place])    eradicated with Harvoni 2015.   Marland Kitchen Hyperlipemia   . Hypertension    Past Surgical History:  Procedure Laterality Date  . ABDOMINAL HYSTERECTOMY    . APPENDECTOMY    . BREAST SURGERY     traumatic injury  . CHOLECYSTECTOMY    . COLONOSCOPY N/A 07/03/2014   Dr. Gala Romney: 5 mm and 1 cm tubular adenomas removed, diverticulosis.  Next colonoscopy in 2018.     SOCIAL HISTORY:  Social History   Socioeconomic History  . Marital status: Widowed    Spouse name: Not on file  . Number of children: 2  . Years of education: GED  . Highest education level: Not on file  Occupational History  . 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 Smoking status: Current Every Day Smoker    Packs/day: 0.50    Years: 50.00    Pack years: 25.00    . Smokeless tobacco: Never Used  . Tobacco comment: one pack every 2-3 days  Substance and Sexual Activity  . Alcohol use: No    Alcohol/week: 0.0 standard drinks  . Drug use: No  . Sexual activity: Not Currently    Birth control/protection: Surgical    Comment: hyst  Other Topics Concern  . Not on file  Social History Narrative  . Not on file   Social Determinants of Health   Financial Resource Strain: High Risk  . Difficulty of Paying Living Expenses: Hard  Food Insecurity: No Food Insecurity  . Worried About Charity fundraiser in the Last Year: Never true  . Ran Out of Food in the Last Year: Never true  Transportation Needs: No Transportation Needs  . Lack of Transportation (Medical): No  . Lack of Transportation (Non-Medical): No  Physical Activity: Inactive  . Days of Exercise per Week: 0 days  . Minutes of Exercise per Session: 0 min  Stress: Stress Concern Present  . Feeling of Stress : To some extent  Social Connections: Somewhat Isolated  . Frequency of Communication with Friends and Family: Three times a week  . Frequency of Social Gatherings with Friends and Family: Three times a week  . Attends Religious Services: More than 4 times per year  . Active Member of Clubs or Organizations: No  .  Attends Archivist Meetings: Never  . Marital Status: Widowed  Intimate Partner Violence: Not At Risk  . Fear of Current or Ex-Partner: No  . Emotionally Abused: No  . Physically Abused: No  . Sexually Abused: No    FAMILY HISTORY:  Family History  Problem Relation Age of Onset  . Alcohol abuse Mother   . Early death Mother   . Early death Father   . Alcohol abuse Sister   . HIV Brother   . Diabetes Brother   . Heart disease Brother   . Hepatitis C Daughter   . Hypertension Son   . Heart disease Brother   . Diabetes Brother   . Cancer Maternal Aunt        stomach  . Hypertension Maternal Aunt   . Heart disease Maternal Aunt   . Cancer Maternal  Uncle        prostate  . Hypertension Maternal Uncle   . Colon cancer Neg Hx     CURRENT MEDICATIONS:  Outpatient Encounter Medications as of 10/14/2019  Medication Sig Note  . aspirin EC 81 MG tablet Take 81 mg by mouth daily.     Marland Kitchen dexamethasone (DECADRON) 4 MG tablet Take 1 tablet (4 mg total) by mouth 2 (two) times daily with a meal.   . diclofenac (VOLTAREN) 75 MG EC tablet Take 1 tablet (75 mg total) by mouth 2 (two) times daily.   . DULoxetine (CYMBALTA) 60 MG capsule    . FISH OIL-CANOLA OIL-VIT D3 PO Take by mouth daily.   . furosemide (LASIX) 40 MG tablet Take 40 mg by mouth daily.     Marland Kitchen gabapentin (NEURONTIN) 300 MG capsule Take 300 mg by mouth 3 (three) times daily.    Marland Kitchen HYDROcodone-acetaminophen (NORCO) 7.5-325 MG tablet Take 1-2 tablets by mouth daily as needed.   Marland Kitchen losartan (COZAAR) 100 MG tablet Take 100 mg by mouth daily.   Marland Kitchen omeprazole (PRILOSEC) 40 MG capsule Take 40 mg by mouth daily.    . potassium chloride (K-DUR) 10 MEQ tablet Take 40 mEq by mouth daily.    Marland Kitchen spironolactone (ALDACTONE) 25 MG tablet Take 1 tablet by mouth daily.   . verapamil (VERELAN PM) 180 MG 24 hr capsule Take 180 mg by mouth daily. 07/18/2016: Received from: External Pharmacy  . [DISCONTINUED] DULoxetine (CYMBALTA) 30 MG capsule Take 30 mg by mouth daily.   . [DISCONTINUED] Multiple Vitamin (MULTIVITAMIN WITH MINERALS) TABS tablet Take 1 tablet by mouth daily.   Marland Kitchen albuterol (VENTOLIN HFA) 108 (90 Base) MCG/ACT inhaler Inhale 2 puffs into the lungs every 4 (four) hours as needed for wheezing or shortness of breath. (Patient not taking: Reported on 09/23/2019)   . clotrimazole-betamethasone (LOTRISONE) cream Apply 1 application topically 2 (two) times daily.    . fluticasone (FLONASE) 50 MCG/ACT nasal spray Place 2 sprays into the nose daily as needed for allergies.    Marland Kitchen ipratropium-albuterol (DUONEB) 0.5-2.5 (3) MG/3ML SOLN SMARTSIG:1 Vial(s) Via Nebulizer 4 Times Daily PRN   . ketoconazole  (NIZORAL) 2 % cream Apply 1 application topically daily.    Marland Kitchen linaclotide (LINZESS) 290 MCG CAPS capsule Take 1 capsule (290 mcg total) by mouth daily before breakfast. (Patient not taking: Reported on 10/14/2019)   . Phenyleph-CPM-DM-Aspirin (ALKA-SELTZER PLUS COLD & COUGH PO) Take 2 capsules by mouth every 6 (six) hours as needed (cough/cold).    No facility-administered encounter medications on file as of 10/14/2019.    ALLERGIES:  Allergies  Allergen Reactions  .  Darvocet [Propoxyphene N-Acetaminophen] Other (See Comments)    Hurts ears and side of face   . Latex Swelling  . Other Other (See Comments)    Hairspray(isoaplus)-causes big soars in head  . Penicillins Other (See Comments)    Has patient had a PCN reaction causing immediate rash, facial/tongue/throat swelling, SOB or lightheadedness with hypotension: No Has patient had a PCN reaction causing severe rash involving mucus membranes or skin necrosis: No Has patient had a PCN reaction that required hospitalization No Has patient had a PCN reaction occurring within the last 10 years: No If all of the above answers are "NO", then may proceed with Cephalosporin use. Hurts ears and side of face   . Tetracyclines & Related Other (See Comments)    Hurts ears and side of face  . Adhesive [Tape] Itching and Rash    Telemetry pads cause itching and rash at site     PHYSICAL EXAM:  ECOG Performance status: 1  Vitals:   10/14/19 1117  BP: 135/66  Pulse: 61  Resp: 19  Temp: (!) 97 F (36.1 C)  SpO2: 97%   Filed Weights   10/14/19 1117  Weight: 252 lb 14.4 oz (114.7 kg)    Physical Exam Vitals reviewed.  Constitutional:      Appearance: Normal appearance.  Cardiovascular:     Rate and Rhythm: Normal rate and regular rhythm.     Heart sounds: Normal heart sounds.  Pulmonary:     Effort: Pulmonary effort is normal.     Breath sounds: Normal breath sounds.  Abdominal:     Palpations: Abdomen is soft.    Musculoskeletal:        General: No swelling.  Skin:    General: Skin is warm and dry.  Neurological:     General: No focal deficit present.     Mental Status: She is alert and oriented to person, place, and time.  Psychiatric:        Mood and Affect: Mood normal.        Behavior: Behavior normal.      LABORATORY DATA:  I have reviewed the labs as listed.  CBC    Component Value Date/Time   WBC 9.2 09/23/2019 1501   RBC 4.62 09/23/2019 1501   HGB 13.2 09/23/2019 1501   HCT 41.8 09/23/2019 1501   PLT 318 09/23/2019 1501   MCV 90.5 09/23/2019 1501   MCH 28.6 09/23/2019 1501   MCHC 31.6 09/23/2019 1501   RDW 12.3 09/23/2019 1501   LYMPHSABS 2.4 09/23/2019 1501   MONOABS 0.7 09/23/2019 1501   EOSABS 0.1 09/23/2019 1501   BASOSABS 0.0 09/23/2019 1501   CMP Latest Ref Rng & Units 10/14/2019 09/23/2019 06/07/2019  Glucose 70 - 99 mg/dL 116(H) 130(H) 82  BUN 8 - 23 mg/dL 17 17 32(H)  Creatinine 0.44 - 1.00 mg/dL 0.96 0.92 0.89  Sodium 135 - 145 mmol/L 137 138 136  Potassium 3.5 - 5.1 mmol/L 4.7 3.0(L) 4.1  Chloride 98 - 111 mmol/L 106 105 107  CO2 22 - 32 mmol/L 22 23 21   Calcium 8.9 - 10.3 mg/dL 9.6 9.1 9.5  Total Protein 6.5 - 8.1 g/dL 9.1(H) 9.0(H) 8.4(H)  Total Bilirubin 0.3 - 1.2 mg/dL 0.6 0.5 0.4  Alkaline Phos 38 - 126 U/L 108 101 -  AST 15 - 41 U/L 16 18 14   ALT 0 - 44 U/L 12 16 13        DIAGNOSTIC IMAGING:  I have independently reviewed the scans  and discussed with the patient.     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  Elevated free light chains: -Patient evaluated at the request of Dr. Legrand Rams for elevated total protein and questionable SPEP on 04/19/2019. -We reviewed results of blood work from 09/23/2019.  Serum immunofixation shows polyclonal gammopathy.  SPEP did not show M spike. -However kappa light chains were elevated at 163.2 and lambda light chains at 44.4.  Ratio is also elevated at 3.68.  Creatinine was  normal at 0.92.  Calcium was normal at 9.1.  Beta-2 microglobulin was elevated at 3.5.  LDH was normal. -We talked about the results in detail.  Differential diagnosis includes nonsecretory myeloma versus free light chain disease. -I have recommended further work-up with 24-hour urine for UPEP, urine free light chains, urine immunofixation.  We will also obtain a skeletal survey.  I have recommended a bone marrow aspiration and biopsy after discussing the risks and benefits of the procedure.  We will schedule it with sedation in [place]. -I will see her back 1 to 2 weeks after the bone marrow biopsy.  2.  Hypokalemia: -Potassium on 09/23/2019 was 3. -We repeated CMP which showed potassium was normal.  She will continue potassium 40 mEq daily.  3.  Tobacco abuse: -She is a current active smoker, 1 pack/day every 2 and half days, for 56 years. -I have recommended low-dose CT scan of the chest for lung cancer screening protocol after discussing the benefits and risks.  She is agreeable.      Orders placed this encounter:  Orders Placed This Encounter  Procedures  . CT CHEST LUNG CA SCREEN LOW DOSE W/O CM  . DG Bone Survey Met  . CT Biopsy  . CT BONE MARROW BIOPSY & ASPIRATION  . 24 hr, Ur UPEP/UIFE/Light Chains/TP      Derek Jack, MD [place] [phone]

## 2019-10-14 NOTE — Assessment & Plan Note (Signed)
1.  Elevated free light chains: -Patient evaluated at the request of Dr. Legrand Rams for elevated total protein and questionable SPEP on 04/19/2019. -We reviewed results of blood work from 09/23/2019.  Serum immunofixation shows polyclonal gammopathy.  SPEP did not show M spike. -However kappa light chains were elevated at 163.2 and lambda light chains at 44.4.  Ratio is also elevated at 3.68.  Creatinine was normal at 0.92.  Calcium was normal at 9.1.  Beta-2 microglobulin was elevated at 3.5.  LDH was normal. -We talked about the results in detail.  Differential diagnosis includes nonsecretory myeloma versus free light chain disease. -I have recommended further work-up with 24-hour urine for UPEP, urine free light chains, urine immunofixation.  We will also obtain a skeletal survey.  I have recommended a bone marrow aspiration and biopsy after discussing the risks and benefits of the procedure.  We will schedule it with sedation in [place]. -I will see her back 1 to 2 weeks after the bone marrow biopsy.  2.  Hypokalemia: -Potassium on 09/23/2019 was 3. -We repeated CMP which showed potassium was normal.  She will continue potassium 40 mEq daily.  3.  Tobacco abuse: -She is a current active smoker, 1 pack/day every 2 and half days, for 56 years. -I have recommended low-dose CT scan of the chest for lung cancer screening protocol after discussing the benefits and risks.  She is agreeable.

## 2019-10-15 DIAGNOSIS — Z20828 Contact with and (suspected) exposure to other viral communicable diseases: Secondary | ICD-10-CM | POA: Diagnosis not present

## 2019-10-21 ENCOUNTER — Other Ambulatory Visit (HOSPITAL_COMMUNITY): Payer: Self-pay | Admitting: *Deleted

## 2019-10-21 ENCOUNTER — Other Ambulatory Visit (HOSPITAL_COMMUNITY): Payer: Self-pay

## 2019-10-21 DIAGNOSIS — R768 Other specified abnormal immunological findings in serum: Secondary | ICD-10-CM

## 2019-10-22 DIAGNOSIS — Z20828 Contact with and (suspected) exposure to other viral communicable diseases: Secondary | ICD-10-CM | POA: Diagnosis not present

## 2019-10-23 LAB — UPEP/UIFE/LIGHT CHAINS/TP, 24-HR UR
% BETA, Urine: 21.8 %
ALPHA 1 URINE: 4.3 %
Albumin, U: 44 %
Alpha 2, Urine: 6.1 %
Free Kappa Lt Chains,Ur: 108.62 mg/L (ref 0.63–113.79)
Free Kappa/Lambda Ratio: 33.94 — ABNORMAL HIGH (ref 1.03–31.76)
Free Lambda Lt Chains,Ur: 3.2 mg/L (ref 0.47–11.77)
GAMMA GLOBULIN URINE: 23.7 %
Total Protein, Urine-Ur/day: 136 mg/24 hr (ref 30–150)
Total Protein, Urine: 23.6 mg/dL
Total Volume: 575

## 2019-10-25 ENCOUNTER — Ambulatory Visit (HOSPITAL_COMMUNITY)
Admission: RE | Admit: 2019-10-25 | Discharge: 2019-10-25 | Disposition: A | Discharge status: Home / Self Care | Payer: Medicare HMO | Source: Ambulatory Visit | Attending: Hematology | Admitting: Hematology

## 2019-10-25 ENCOUNTER — Other Ambulatory Visit: Payer: Self-pay

## 2019-10-25 DIAGNOSIS — R768 Other specified abnormal immunological findings in serum: Secondary | ICD-10-CM | POA: Diagnosis not present

## 2019-10-25 DIAGNOSIS — F17210 Nicotine dependence, cigarettes, uncomplicated: Secondary | ICD-10-CM | POA: Diagnosis not present

## 2019-10-29 DIAGNOSIS — Z20828 Contact with and (suspected) exposure to other viral communicable diseases: Secondary | ICD-10-CM | POA: Diagnosis not present

## 2019-11-01 DIAGNOSIS — M545 Low back pain: Secondary | ICD-10-CM | POA: Diagnosis not present

## 2019-11-01 DIAGNOSIS — M5416 Radiculopathy, lumbar region: Secondary | ICD-10-CM | POA: Diagnosis not present

## 2019-11-05 DIAGNOSIS — Z20828 Contact with and (suspected) exposure to other viral communicable diseases: Secondary | ICD-10-CM | POA: Diagnosis not present

## 2019-11-06 ENCOUNTER — Other Ambulatory Visit: Payer: Self-pay | Admitting: Radiology

## 2019-11-07 ENCOUNTER — Other Ambulatory Visit: Payer: Self-pay | Admitting: Student

## 2019-11-07 ENCOUNTER — Other Ambulatory Visit: Payer: Self-pay | Admitting: Radiology

## 2019-11-08 ENCOUNTER — Ambulatory Visit (HOSPITAL_COMMUNITY)
Admission: RE | Admit: 2019-11-08 | Discharge: 2019-11-08 | Disposition: A | Discharge status: Home / Self Care | Payer: Medicare HMO | Source: Ambulatory Visit | Attending: Hematology | Admitting: Hematology

## 2019-11-08 ENCOUNTER — Other Ambulatory Visit: Payer: Self-pay

## 2019-11-08 ENCOUNTER — Encounter (HOSPITAL_COMMUNITY): Payer: Self-pay

## 2019-11-08 DIAGNOSIS — I10 Essential (primary) hypertension: Secondary | ICD-10-CM | POA: Insufficient documentation

## 2019-11-08 DIAGNOSIS — F17210 Nicotine dependence, cigarettes, uncomplicated: Secondary | ICD-10-CM | POA: Diagnosis not present

## 2019-11-08 DIAGNOSIS — R768 Other specified abnormal immunological findings in serum: Secondary | ICD-10-CM | POA: Diagnosis not present

## 2019-11-08 DIAGNOSIS — J45909 Unspecified asthma, uncomplicated: Secondary | ICD-10-CM | POA: Diagnosis not present

## 2019-11-08 DIAGNOSIS — Z7901 Long term (current) use of anticoagulants: Secondary | ICD-10-CM | POA: Diagnosis not present

## 2019-11-08 DIAGNOSIS — Z79899 Other long term (current) drug therapy: Secondary | ICD-10-CM | POA: Diagnosis not present

## 2019-11-08 DIAGNOSIS — K7460 Unspecified cirrhosis of liver: Secondary | ICD-10-CM | POA: Insufficient documentation

## 2019-11-08 DIAGNOSIS — D72829 Elevated white blood cell count, unspecified: Secondary | ICD-10-CM | POA: Diagnosis not present

## 2019-11-08 DIAGNOSIS — E785 Hyperlipidemia, unspecified: Secondary | ICD-10-CM | POA: Insufficient documentation

## 2019-11-08 DIAGNOSIS — M899 Disorder of bone, unspecified: Secondary | ICD-10-CM | POA: Diagnosis not present

## 2019-11-08 DIAGNOSIS — Z7982 Long term (current) use of aspirin: Secondary | ICD-10-CM | POA: Insufficient documentation

## 2019-11-08 DIAGNOSIS — D72822 Plasmacytosis: Secondary | ICD-10-CM | POA: Insufficient documentation

## 2019-11-08 DIAGNOSIS — F329 Major depressive disorder, single episode, unspecified: Secondary | ICD-10-CM | POA: Insufficient documentation

## 2019-11-08 LAB — CBC WITH DIFFERENTIAL/PLATELET
Abs Immature Granulocytes: 0.1 10*3/uL — ABNORMAL HIGH (ref 0.00–0.07)
Basophils Absolute: 0.1 10*3/uL (ref 0.0–0.1)
Basophils Relative: 0 %
Eosinophils Absolute: 0.2 10*3/uL (ref 0.0–0.5)
Eosinophils Relative: 1 %
HCT: 40.1 % (ref 36.0–46.0)
Hemoglobin: 12.5 g/dL (ref 12.0–15.0)
Immature Granulocytes: 1 %
Lymphocytes Relative: 19 %
Lymphs Abs: 3.3 10*3/uL (ref 0.7–4.0)
MCH: 28.8 pg (ref 26.0–34.0)
MCHC: 31.2 g/dL (ref 30.0–36.0)
MCV: 92.4 fL (ref 80.0–100.0)
Monocytes Absolute: 1.3 10*3/uL — ABNORMAL HIGH (ref 0.1–1.0)
Monocytes Relative: 8 %
Neutro Abs: 12.2 10*3/uL — ABNORMAL HIGH (ref 1.7–7.7)
Neutrophils Relative %: 71 %
Platelets: 348 10*3/uL (ref 150–400)
RBC: 4.34 MIL/uL (ref 3.87–5.11)
RDW: 13 % (ref 11.5–15.5)
WBC: 17.1 10*3/uL — ABNORMAL HIGH (ref 4.0–10.5)
nRBC: 0 % (ref 0.0–0.2)

## 2019-11-08 LAB — PROTIME-INR
INR: 0.9 (ref 0.8–1.2)
Prothrombin Time: 12.4 seconds (ref 11.4–15.2)

## 2019-11-08 MED ORDER — MIDAZOLAM HCL 2 MG/2ML IJ SOLN
INTRAMUSCULAR | Status: AC | PRN
  Administered 2019-11-08 (×2): 1 mg via INTRAVENOUS

## 2019-11-08 MED ORDER — FENTANYL CITRATE (PF) 100 MCG/2ML IJ SOLN
INTRAMUSCULAR | Status: AC
  Filled 2019-11-08: qty 2

## 2019-11-08 MED ORDER — MIDAZOLAM HCL 2 MG/2ML IJ SOLN
INTRAMUSCULAR | Status: AC
  Filled 2019-11-08: qty 4

## 2019-11-08 MED ORDER — LIDOCAINE HCL (PF) 1 % IJ SOLN
INTRAMUSCULAR | Status: AC | PRN
  Administered 2019-11-08: 10 mL

## 2019-11-08 MED ORDER — FENTANYL CITRATE (PF) 100 MCG/2ML IJ SOLN
INTRAMUSCULAR | Status: AC | PRN
  Administered 2019-11-08: 50 ug via INTRAVENOUS

## 2019-11-08 MED ORDER — SODIUM CHLORIDE 0.9 % IV SOLN: INTRAVENOUS | Status: DC

## 2019-11-08 NOTE — Discharge Instructions (Signed)
Bone Marrow Aspiration and Bone Marrow Biopsy, Adult, Care After This sheet gives you information about how to care for yourself after your procedure. Your health care provider may also give you more specific instructions. If you have problems or questions, contact your health care provider. What can I expect after the procedure? After the procedure, it is common to have:  Mild pain and tenderness.  Swelling.  Bruising. Follow these instructions at home: Puncture site care   Follow instructions from your health care provider about how to take care of the puncture site. Make sure you: ? Wash your hands with soap and water before and after you change your bandage (dressing). If soap and water are not available, use hand sanitizer. ? Change your dressing as told by your health care provider.  Check your puncture site every day for signs of infection. Check for: ? More redness, swelling, or pain. ? Fluid or blood. ? Warmth. ? Pus or a bad smell. Activity  Return to your normal activities as told by your health care provider. Ask your health care provider what activities are safe for you.  Do not lift anything that is heavier than 10 lb (4.5 kg), or the limit that you are told, until your health care provider says that it is safe.  Do not drive for 24 hours if you were given a sedative during your procedure. General instructions   Take over-the-counter and prescription medicines only as told by your health care provider.  Do not take baths, swim, or use a hot tub until your health care provider approves. Ask your health care provider if you may take showers. You may only be allowed to take sponge baths.  If directed, put ice on the affected area. To do this: ? Put ice in a plastic bag. ? Place a towel between your skin and the bag. ? Leave the ice on for 20 minutes, 2-3 times a day.  Keep all follow-up visits as told by your health care provider. This is important. Contact a  health care provider if:  Your pain is not controlled with medicine.  You have a fever.  You have more redness, swelling, or pain around the puncture site.  You have fluid or blood coming from the puncture site.  Your puncture site feels warm to the touch.  You have pus or a bad smell coming from the puncture site. Summary  After the procedure, it is common to have mild pain, tenderness, swelling, and bruising.  Follow instructions from your health care provider about how to take care of the puncture site and what activities are safe for you.  Take over-the-counter and prescription medicines only as told by your health care provider.  Contact a health care provider if you have any signs of infection, such as fluid or blood coming from the puncture site. This information is not intended to replace advice given to you by your health care provider. Make sure you discuss any questions you have with your health care provider. Document Revised: 12/25/2018 Document Reviewed: 12/25/2018 Elsevier Patient Education  [place]. Moderate Conscious Sedation, Adult, Care After These instructions provide you with information about caring for yourself after your procedure. Your health care provider may also give you more specific instructions. Your treatment has been planned according to current medical practices, but problems sometimes occur. Call your health care provider if you have any problems or questions after your procedure. What can I expect after the procedure? After your procedure,  it is common:  To feel sleepy for several hours.  To feel clumsy and have poor balance for several hours.  To have poor judgment for several hours.  To vomit if you eat too soon. Follow these instructions at home: For at least 24 hours after the procedure:   Do not: ? Participate in activities where you could fall or become injured. ? Drive. ? Use heavy machinery. ? Drink alcohol. ? Take  sleeping pills or medicines that cause drowsiness. ? Make important decisions or sign legal documents. ? Take care of children on your own.  Rest. Eating and drinking  Follow the diet recommended by your health care provider.  If you vomit: ? Drink water, juice, or soup when you can drink without vomiting. ? Make sure you have little or no nausea before eating solid foods. General instructions  Have a responsible adult stay with you until you are awake and alert.  Take over-the-counter and prescription medicines only as told by your health care provider.  If you smoke, do not smoke without supervision.  Keep all follow-up visits as told by your health care provider. This is important. Contact a health care provider if:  You keep feeling nauseous or you keep vomiting.  You feel light-headed.  You develop a rash.  You have a fever. Get help right away if:  You have trouble breathing. This information is not intended to replace advice given to you by your health care provider. Make sure you discuss any questions you have with your health care provider. Document Revised: 07/21/2017 Document Reviewed: 11/28/2015 Elsevier Patient Education  2020 [employer].

## 2019-11-08 NOTE — Procedures (Signed)
  Procedure: CT bone marrow biopsy R iliac EBL:   minimal Complications:  none immediate  See full dictation in [facility].  Dillard Cannon MD Main # [phone] Pager  [phone]
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
## 2019-11-11 ENCOUNTER — Ambulatory Visit (HOSPITAL_COMMUNITY): Payer: Medicare HMO | Admitting: Hematology

## 2019-11-11 DIAGNOSIS — M170 Bilateral primary osteoarthritis of knee: Secondary | ICD-10-CM | POA: Diagnosis not present

## 2019-11-11 DIAGNOSIS — E7849 Other hyperlipidemia: Secondary | ICD-10-CM | POA: Diagnosis not present

## 2019-11-12 ENCOUNTER — Telehealth: Payer: Self-pay

## 2019-11-12 DIAGNOSIS — Z20828 Contact with and (suspected) exposure to other viral communicable diseases: Secondary | ICD-10-CM | POA: Diagnosis not present

## 2019-11-12 NOTE — Telephone Encounter (Signed)
[place] at pre-service center called office and LMOVM. TCS/EGD scheduled for 11/21/19 needs PA.   PA for EGD approved via [employer]. [employer]# [id number], valid 11/21/19-12/21/19.  PA for TCS submitted via [employer] website. Case went to clinical review. [place] tracking# [id number]. Clinical notes faxed to [facility].  No PA needed for varices banding per [employer], code not found.

## 2019-11-13 NOTE — Telephone Encounter (Signed)
TCS approved. [employer]# [id number], valid 11/21/19-12/21/19.

## 2019-11-15 DIAGNOSIS — M545 Low back pain: Secondary | ICD-10-CM | POA: Diagnosis not present

## 2019-11-15 DIAGNOSIS — M542 Cervicalgia: Secondary | ICD-10-CM | POA: Diagnosis not present

## 2019-11-15 DIAGNOSIS — M5416 Radiculopathy, lumbar region: Secondary | ICD-10-CM | POA: Diagnosis not present

## 2019-11-15 DIAGNOSIS — M25519 Pain in unspecified shoulder: Secondary | ICD-10-CM | POA: Diagnosis not present

## 2019-11-15 DIAGNOSIS — M25511 Pain in right shoulder: Secondary | ICD-10-CM | POA: Diagnosis not present

## 2019-11-18 ENCOUNTER — Encounter (HOSPITAL_COMMUNITY): Payer: Self-pay | Admitting: Hematology

## 2019-11-19 ENCOUNTER — Encounter (HOSPITAL_COMMUNITY)
Admission: RE | Admit: 2019-11-19 | Discharge: 2019-11-19 | Disposition: A | Discharge status: Home / Self Care | Payer: Medicare HMO | Source: Ambulatory Visit | Attending: Internal Medicine | Admitting: Internal Medicine

## 2019-11-19 ENCOUNTER — Other Ambulatory Visit: Payer: Self-pay

## 2019-11-19 ENCOUNTER — Other Ambulatory Visit (HOSPITAL_COMMUNITY)
Admission: RE | Admit: 2019-11-19 | Discharge: 2019-11-19 | Disposition: A | Discharge status: Home / Self Care | Payer: Medicare HMO | Source: Ambulatory Visit | Attending: Internal Medicine | Admitting: Internal Medicine

## 2019-11-19 ENCOUNTER — Encounter (HOSPITAL_COMMUNITY): Payer: Self-pay

## 2019-11-19 DIAGNOSIS — Z79899 Other long term (current) drug therapy: Secondary | ICD-10-CM | POA: Diagnosis not present

## 2019-11-19 DIAGNOSIS — Z538 Procedure and treatment not carried out for other reasons: Secondary | ICD-10-CM | POA: Diagnosis present

## 2019-11-19 DIAGNOSIS — J690 Pneumonitis due to inhalation of food and vomit: Secondary | ICD-10-CM | POA: Diagnosis not present

## 2019-11-19 DIAGNOSIS — R1310 Dysphagia, unspecified: Secondary | ICD-10-CM | POA: Diagnosis not present

## 2019-11-19 DIAGNOSIS — K219 Gastro-esophageal reflux disease without esophagitis: Secondary | ICD-10-CM | POA: Diagnosis not present

## 2019-11-19 DIAGNOSIS — Z885 Allergy status to narcotic agent status: Secondary | ICD-10-CM | POA: Diagnosis not present

## 2019-11-19 DIAGNOSIS — T41295A Adverse effect of other general anesthetics, initial encounter: Secondary | ICD-10-CM | POA: Diagnosis present

## 2019-11-19 DIAGNOSIS — Z20822 Contact with and (suspected) exposure to covid-19: Secondary | ICD-10-CM | POA: Diagnosis present

## 2019-11-19 DIAGNOSIS — E872 Acidosis: Secondary | ICD-10-CM | POA: Diagnosis present

## 2019-11-19 DIAGNOSIS — J9601 Acute respiratory failure with hypoxia: Secondary | ICD-10-CM | POA: Diagnosis not present

## 2019-11-19 DIAGNOSIS — Z6841 Body Mass Index (BMI) 40.0 and over, adult: Secondary | ICD-10-CM | POA: Diagnosis not present

## 2019-11-19 DIAGNOSIS — J449 Chronic obstructive pulmonary disease, unspecified: Secondary | ICD-10-CM | POA: Diagnosis not present

## 2019-11-19 DIAGNOSIS — J9690 Respiratory failure, unspecified, unspecified whether with hypoxia or hypercapnia: Secondary | ICD-10-CM | POA: Diagnosis not present

## 2019-11-19 DIAGNOSIS — I509 Heart failure, unspecified: Secondary | ICD-10-CM | POA: Diagnosis not present

## 2019-11-19 DIAGNOSIS — I8510 Secondary esophageal varices without bleeding: Secondary | ICD-10-CM | POA: Diagnosis present

## 2019-11-19 DIAGNOSIS — E785 Hyperlipidemia, unspecified: Secondary | ICD-10-CM | POA: Diagnosis present

## 2019-11-19 DIAGNOSIS — J45909 Unspecified asthma, uncomplicated: Secondary | ICD-10-CM | POA: Diagnosis present

## 2019-11-19 DIAGNOSIS — F329 Major depressive disorder, single episode, unspecified: Secondary | ICD-10-CM | POA: Diagnosis present

## 2019-11-19 DIAGNOSIS — Z79891 Long term (current) use of opiate analgesic: Secondary | ICD-10-CM | POA: Diagnosis not present

## 2019-11-19 DIAGNOSIS — I10 Essential (primary) hypertension: Secondary | ICD-10-CM | POA: Diagnosis not present

## 2019-11-19 DIAGNOSIS — Z4682 Encounter for fitting and adjustment of non-vascular catheter: Secondary | ICD-10-CM | POA: Diagnosis not present

## 2019-11-19 DIAGNOSIS — Z86010 Personal history of colonic polyps: Secondary | ICD-10-CM | POA: Diagnosis not present

## 2019-11-19 DIAGNOSIS — Z880 Allergy status to penicillin: Secondary | ICD-10-CM | POA: Diagnosis not present

## 2019-11-19 DIAGNOSIS — N179 Acute kidney failure, unspecified: Secondary | ICD-10-CM | POA: Diagnosis present

## 2019-11-19 DIAGNOSIS — T8859XA Other complications of anesthesia, initial encounter: Secondary | ICD-10-CM | POA: Diagnosis present

## 2019-11-19 DIAGNOSIS — Z8619 Personal history of other infectious and parasitic diseases: Secondary | ICD-10-CM | POA: Diagnosis not present

## 2019-11-19 DIAGNOSIS — M1990 Unspecified osteoarthritis, unspecified site: Secondary | ICD-10-CM | POA: Diagnosis present

## 2019-11-19 DIAGNOSIS — I110 Hypertensive heart disease with heart failure: Secondary | ICD-10-CM | POA: Diagnosis not present

## 2019-11-19 DIAGNOSIS — I340 Nonrheumatic mitral (valve) insufficiency: Secondary | ICD-10-CM | POA: Diagnosis not present

## 2019-11-19 DIAGNOSIS — Z7982 Long term (current) use of aspirin: Secondary | ICD-10-CM | POA: Diagnosis not present

## 2019-11-19 DIAGNOSIS — K7460 Unspecified cirrhosis of liver: Secondary | ICD-10-CM | POA: Diagnosis not present

## 2019-11-19 DIAGNOSIS — B182 Chronic viral hepatitis C: Secondary | ICD-10-CM | POA: Diagnosis present

## 2019-11-19 DIAGNOSIS — R1314 Dysphagia, pharyngoesophageal phase: Secondary | ICD-10-CM | POA: Diagnosis present

## 2019-11-19 DIAGNOSIS — I361 Nonrheumatic tricuspid (valve) insufficiency: Secondary | ICD-10-CM | POA: Diagnosis not present

## 2019-11-19 DIAGNOSIS — R0989 Other specified symptoms and signs involving the circulatory and respiratory systems: Secondary | ICD-10-CM | POA: Diagnosis not present

## 2019-11-19 DIAGNOSIS — Z978 Presence of other specified devices: Secondary | ICD-10-CM | POA: Diagnosis not present

## 2019-11-19 LAB — SARS CORONAVIRUS 2 (TAT 6-24 HRS): SARS Coronavirus 2: NEGATIVE

## 2019-11-19 LAB — SURGICAL PATHOLOGY

## 2019-11-19 NOTE — Patient Instructions (Signed)
Megan Odom  11/19/2019     @PREFPERIOPPHARMACY @   Your procedure is scheduled on  11/21/2019   Report to [name] at  [place].M.  Call this number if you have problems the morning of surgery:  [phone]   Remember:  Follow the diet and prep instructions given to you by Dr Roseanne Kaufman office                    Take these medicines the morning of surgery with A SIP OF WATER  Voltaren(if neded), cymbalta, neurontin, hydrocodone(if needed), losartan, omeprazole, verapamil. Use your inhaler and your nebulizer before you come.    Do not wear jewelry, make-up or nail polish.  Do not wear lotions, powders, or perfumes. Please wear deodorant and brush your teeth.  Do not shave 48 hours prior to surgery.  Men may shave face and neck.  Do not bring valuables to the hospital.  [name] is not responsible for any belongings or valuables.  Contacts, dentures or bridgework may not be worn into surgery.  Leave your suitcase in the car.  After surgery it may be brought to your room.  For patients admitted to the hospital, discharge time will be determined by your treatment team.  Patients discharged the day of surgery will not be allowed to drive home.   Name and phone number of your driver:   family Special instructions:  DO NOT smoke the morning of your procedure.  Please read over the following fact sheets that you were given. Anesthesia Post-op Instructions and Care and Recovery After Surgery       Upper Endoscopy, Adult, Care After This sheet gives you information about how to care for yourself after your procedure. Your health care provider may also give you more specific instructions. If you have problems or questions, contact your health care provider. What can I expect after the procedure? After the procedure, it is common to have:  A sore throat.  Mild stomach pain or discomfort.  Bloating.  Nausea. Follow these instructions at home:   Follow  instructions from your health care provider about what to eat or drink after your procedure.  Return to your normal activities as told by your health care provider. Ask your health care provider what activities are safe for you.  Take over-the-counter and prescription medicines only as told by your health care provider.  Do not drive for 24 hours if you were given a sedative during your procedure.  Keep all follow-up visits as told by your health care provider. This is important. Contact a health care provider if you have:  A sore throat that lasts longer than one day.  Trouble swallowing. Get help right away if:  You vomit blood or your vomit looks like coffee grounds.  You have: ? A fever. ? Bloody, black, or tarry stools. ? A severe sore throat or you cannot swallow. ? Difficulty breathing. ? Severe pain in your chest or abdomen. Summary  After the procedure, it is common to have a sore throat, mild stomach discomfort, bloating, and nausea.  Do not drive for 24 hours if you were given a sedative during the procedure.  Follow instructions from your health care provider about what to eat or drink after your procedure.  Return to your normal activities as told by your health care provider. This information is not intended to replace advice given to you by  your health care provider. Make sure you discuss any questions you have with your health care provider. Document Revised: 01/30/2018 Document Reviewed: 01/08/2018 Elsevier Patient Education  [place].  Esophageal Dilatation Esophageal dilatation, also called esophageal dilation, is a procedure to widen or open (dilate) a blocked or narrowed part of the esophagus. The esophagus is the part of the body that moves food and liquid from the mouth to the stomach. You may need this procedure if:  You have a buildup of scar tissue in your esophagus that makes it difficult, painful, or impossible to swallow. This can be  caused by gastroesophageal reflux disease (GERD).  You have cancer of the esophagus.  There is a problem with how food moves through your esophagus. In some cases, you may need this procedure repeated at a later time to dilate the esophagus gradually. Tell a health care provider about:  Any allergies you have.  All medicines you are taking, including vitamins, herbs, eye drops, creams, and over-the-counter medicines.  Any problems you or family members have had with anesthetic medicines.  Any blood disorders you have.  Any surgeries you have had.  Any medical conditions you have.  Any antibiotic medicines you are required to take before dental procedures.  Whether you are pregnant or may be pregnant. What are the risks? Generally, this is a safe procedure. However, problems may occur, including:  Bleeding due to a tear in the lining of the esophagus.  A hole (perforation) in the esophagus. What happens before the procedure?  Follow instructions from your health care provider about eating or drinking restrictions.  Ask your health care provider about changing or stopping your regular medicines. This is especially important if you are taking diabetes medicines or blood thinners.  Plan to have someone take you home from the hospital or clinic.  Plan to have a responsible adult care for you for at least 24 hours after you leave the hospital or clinic. This is important. What happens during the procedure?  You may be given a medicine to help you relax (sedative).  A numbing medicine may be sprayed into the back of your throat, or you may gargle the medicine.  Your health care provider may perform the dilatation using various surgical instruments, such as: ? Simple dilators. This instrument is carefully placed in the esophagus to stretch it. ? Guided wire bougies. This involves using an endoscope to insert a wire into the esophagus. A dilator is passed over this wire to  enlarge the esophagus. Then the wire is removed. ? Balloon dilators. An endoscope with a small balloon at the end is inserted into the esophagus. The balloon is inflated to stretch the esophagus and open it up. The procedure may vary among health care providers and hospitals. What happens after the procedure?  Your blood pressure, heart rate, breathing rate, and blood oxygen level will be monitored until the medicines you were given have worn off.  Your throat may feel slightly sore and numb. This will improve slowly over time.  You will not be allowed to eat or drink until your throat is no longer numb.  When you are able to drink, urinate, and sit on the edge of the bed without nausea or dizziness, you may be able to return home. Follow these instructions at home:  Take over-the-counter and prescription medicines only as told by your health care provider.  Do not drive for 24 hours if you were given a sedative during your procedure.  You should have a responsible adult with you for 24 hours after the procedure.  Follow instructions from your health care provider about any eating or drinking restrictions.  Do not use any products that contain nicotine or tobacco, such as cigarettes and e-cigarettes. If you need help quitting, ask your health care provider.  Keep all follow-up visits as told by your health care provider. This is important. Get help right away if you:  Have a fever.  Have chest pain.  Have pain that is not relieved by medication.  Have trouble breathing.  Have trouble swallowing.  Vomit blood. Summary  Esophageal dilatation, also called esophageal dilation, is a procedure to widen or open (dilate) a blocked or narrowed part of the esophagus.  Plan to have someone take you home from the hospital or clinic.  For this procedure, a numbing medicine may be sprayed into the back of your throat, or you may gargle the medicine.  Do not drive for 24 hours if you  were given a sedative during your procedure. This information is not intended to replace advice given to you by your health care provider. Make sure you discuss any questions you have with your health care provider. Document Revised: 06/05/2019 Document Reviewed: 06/13/2017 Elsevier Patient Education  [place].  Colonoscopy, Adult, Care After This sheet gives you information about how to care for yourself after your procedure. Your health care provider may also give you more specific instructions. If you have problems or questions, contact your health care provider. What can I expect after the procedure? After the procedure, it is common to have:  A small amount of blood in your stool for 24 hours after the procedure.  Some gas.  Mild cramping or bloating of your abdomen. Follow these instructions at home: Eating and drinking   Drink enough fluid to keep your urine pale yellow.  Follow instructions from your health care provider about eating or drinking restrictions.  Resume your normal diet as instructed by your health care provider. Avoid heavy or fried foods that are hard to digest. Activity  Rest as told by your health care provider.  Avoid sitting for a long time without moving. Get up to take short walks every 1-2 hours. This is important to improve blood flow and breathing. Ask for help if you feel weak or unsteady.  Return to your normal activities as told by your health care provider. Ask your health care provider what activities are safe for you. Managing cramping and bloating   Try walking around when you have cramps or feel bloated.  Apply heat to your abdomen as told by your health care provider. Use the heat source that your health care provider recommends, such as a moist heat pack or a heating pad. ? Place a towel between your skin and the heat source. ? Leave the heat on for 20-30 minutes. ? Remove the heat if your skin turns bright red. This is  especially important if you are unable to feel pain, heat, or cold. You may have a greater risk of getting burned. General instructions  For the first 24 hours after the procedure: ? Do not drive or use machinery. ? Do not sign important documents. ? Do not drink alcohol. ? Do your regular daily activities at a slower pace than normal. ? Eat soft foods that are easy to digest.  Take over-the-counter and prescription medicines only as told by your health care provider.  Keep all follow-up visits as told  by your health care provider. This is important. Contact a health care provider if:  You have blood in your stool 2-3 days after the procedure. Get help right away if you have:  More than a small spotting of blood in your stool.  Large blood clots in your stool.  Swelling of your abdomen.  Nausea or vomiting.  A fever.  Increasing pain in your abdomen that is not relieved with medicine. Summary  After the procedure, it is common to have a small amount of blood in your stool. You may also have mild cramping and bloating of your abdomen.  For the first 24 hours after the procedure, do not drive or use machinery, sign important documents, or drink alcohol.  Get help right away if you have a lot of blood in your stool, nausea or vomiting, a fever, or increased pain in your abdomen. This information is not intended to replace advice given to you by your health care provider. Make sure you discuss any questions you have with your health care provider. Document Revised: 03/04/2019 Document Reviewed: 03/04/2019 Elsevier Patient Education  [place] After These instructions provide you with information about caring for yourself after your procedure. Your health care provider may also give you more specific instructions. Your treatment has been planned according to current medical practices, but problems sometimes occur. Call your health care  provider if you have any problems or questions after your procedure. What can I expect after the procedure? After your procedure, you may:  Feel sleepy for several hours.  Feel clumsy and have poor balance for several hours.  Feel forgetful about what happened after the procedure.  Have poor judgment for several hours.  Feel nauseous or vomit.  Have a sore throat if you had a breathing tube during the procedure. Follow these instructions at home: For at least 24 hours after the procedure:      Have a responsible adult stay with you. It is important to have someone help care for you until you are awake and alert.  Rest as needed.  Do not: ? Participate in activities in which you could fall or become injured. ? Drive. ? Use heavy machinery. ? Drink alcohol. ? Take sleeping pills or medicines that cause drowsiness. ? Make important decisions or sign legal documents. ? Take care of children on your own. Eating and drinking  Follow the diet that is recommended by your health care provider.  If you vomit, drink water, juice, or soup when you can drink without vomiting.  Make sure you have little or no nausea before eating solid foods. General instructions  Take over-the-counter and prescription medicines only as told by your health care provider.  If you have sleep apnea, surgery and certain medicines can increase your risk for breathing problems. Follow instructions from your health care provider about wearing your sleep device: ? Anytime you are sleeping, including during daytime naps. ? While taking prescription pain medicines, sleeping medicines, or medicines that make you drowsy.  If you smoke, do not smoke without supervision.  Keep all follow-up visits as told by your health care provider. This is important. Contact a health care provider if:  You keep feeling nauseous or you keep vomiting.  You feel light-headed.  You develop a rash.  You have a  fever. Get help right away if:  You have trouble breathing. Summary  For several hours after your procedure, you may feel sleepy and have poor judgment.  Have a responsible adult stay with you for at least 24 hours or until you are awake and alert. This information is not intended to replace advice given to you by your health care provider. Make sure you discuss any questions you have with your health care provider. Document Revised: 11/06/2017 Document Reviewed: 11/29/2015 Elsevier Patient Education  [place].

## 2019-11-21 ENCOUNTER — Ambulatory Visit (HOSPITAL_COMMUNITY): Payer: Medicare HMO | Admitting: Anesthesiology

## 2019-11-21 ENCOUNTER — Encounter (HOSPITAL_COMMUNITY)
Admission: AD | Disposition: A | Discharge status: Home / Self Care | Payer: Self-pay | Source: Home / Self Care | Attending: Internal Medicine

## 2019-11-21 ENCOUNTER — Other Ambulatory Visit: Payer: Self-pay

## 2019-11-21 ENCOUNTER — Encounter (HOSPITAL_COMMUNITY): Payer: Self-pay | Admitting: Internal Medicine

## 2019-11-21 ENCOUNTER — Observation Stay (HOSPITAL_COMMUNITY): Payer: Medicare HMO

## 2019-11-21 ENCOUNTER — Inpatient Hospital Stay (HOSPITAL_COMMUNITY)
Admission: AD | Admit: 2019-11-21 | Discharge: 2019-11-23 | DRG: 917 | Disposition: A | Discharge status: Home / Self Care | Payer: Medicare HMO | Attending: Internal Medicine | Admitting: Internal Medicine

## 2019-11-21 DIAGNOSIS — R911 Solitary pulmonary nodule: Secondary | ICD-10-CM | POA: Diagnosis present

## 2019-11-21 DIAGNOSIS — Z79891 Long term (current) use of opiate analgesic: Secondary | ICD-10-CM | POA: Diagnosis not present

## 2019-11-21 DIAGNOSIS — Z8249 Family history of ischemic heart disease and other diseases of the circulatory system: Secondary | ICD-10-CM

## 2019-11-21 DIAGNOSIS — B182 Chronic viral hepatitis C: Secondary | ICD-10-CM | POA: Diagnosis present

## 2019-11-21 DIAGNOSIS — Z4682 Encounter for fitting and adjustment of non-vascular catheter: Secondary | ICD-10-CM | POA: Diagnosis not present

## 2019-11-21 DIAGNOSIS — Z880 Allergy status to penicillin: Secondary | ICD-10-CM

## 2019-11-21 DIAGNOSIS — E872 Acidosis: Secondary | ICD-10-CM | POA: Diagnosis present

## 2019-11-21 DIAGNOSIS — R0989 Other specified symptoms and signs involving the circulatory and respiratory systems: Secondary | ICD-10-CM | POA: Diagnosis not present

## 2019-11-21 DIAGNOSIS — I340 Nonrheumatic mitral (valve) insufficiency: Secondary | ICD-10-CM | POA: Diagnosis not present

## 2019-11-21 DIAGNOSIS — I8510 Secondary esophageal varices without bleeding: Secondary | ICD-10-CM | POA: Diagnosis present

## 2019-11-21 DIAGNOSIS — N179 Acute kidney failure, unspecified: Secondary | ICD-10-CM | POA: Diagnosis present

## 2019-11-21 DIAGNOSIS — J45909 Unspecified asthma, uncomplicated: Secondary | ICD-10-CM | POA: Diagnosis present

## 2019-11-21 DIAGNOSIS — K7460 Unspecified cirrhosis of liver: Secondary | ICD-10-CM | POA: Diagnosis present

## 2019-11-21 DIAGNOSIS — J690 Pneumonitis due to inhalation of food and vomit: Secondary | ICD-10-CM | POA: Diagnosis present

## 2019-11-21 DIAGNOSIS — Z8619 Personal history of other infectious and parasitic diseases: Secondary | ICD-10-CM | POA: Diagnosis not present

## 2019-11-21 DIAGNOSIS — F329 Major depressive disorder, single episode, unspecified: Secondary | ICD-10-CM | POA: Diagnosis present

## 2019-11-21 DIAGNOSIS — K219 Gastro-esophageal reflux disease without esophagitis: Secondary | ICD-10-CM

## 2019-11-21 DIAGNOSIS — Z79899 Other long term (current) drug therapy: Secondary | ICD-10-CM | POA: Diagnosis not present

## 2019-11-21 DIAGNOSIS — Z881 Allergy status to other antibiotic agents status: Secondary | ICD-10-CM

## 2019-11-21 DIAGNOSIS — Z978 Presence of other specified devices: Secondary | ICD-10-CM

## 2019-11-21 DIAGNOSIS — R1314 Dysphagia, pharyngoesophageal phase: Secondary | ICD-10-CM | POA: Diagnosis present

## 2019-11-21 DIAGNOSIS — Z91040 Latex allergy status: Secondary | ICD-10-CM

## 2019-11-21 DIAGNOSIS — T8859XA Other complications of anesthesia, initial encounter: Principal | ICD-10-CM | POA: Diagnosis present

## 2019-11-21 DIAGNOSIS — Z833 Family history of diabetes mellitus: Secondary | ICD-10-CM

## 2019-11-21 DIAGNOSIS — J9600 Acute respiratory failure, unspecified whether with hypoxia or hypercapnia: Secondary | ICD-10-CM | POA: Diagnosis present

## 2019-11-21 DIAGNOSIS — J9601 Acute respiratory failure with hypoxia: Secondary | ICD-10-CM | POA: Diagnosis present

## 2019-11-21 DIAGNOSIS — I361 Nonrheumatic tricuspid (valve) insufficiency: Secondary | ICD-10-CM | POA: Diagnosis not present

## 2019-11-21 DIAGNOSIS — Z7982 Long term (current) use of aspirin: Secondary | ICD-10-CM | POA: Diagnosis not present

## 2019-11-21 DIAGNOSIS — Z20822 Contact with and (suspected) exposure to covid-19: Secondary | ICD-10-CM | POA: Diagnosis present

## 2019-11-21 DIAGNOSIS — Z6841 Body Mass Index (BMI) 40.0 and over, adult: Secondary | ICD-10-CM

## 2019-11-21 DIAGNOSIS — D126 Benign neoplasm of colon, unspecified: Secondary | ICD-10-CM

## 2019-11-21 DIAGNOSIS — R1310 Dysphagia, unspecified: Secondary | ICD-10-CM

## 2019-11-21 DIAGNOSIS — Z538 Procedure and treatment not carried out for other reasons: Secondary | ICD-10-CM | POA: Diagnosis present

## 2019-11-21 DIAGNOSIS — Z809 Family history of malignant neoplasm, unspecified: Secondary | ICD-10-CM

## 2019-11-21 DIAGNOSIS — J432 Centrilobular emphysema: Secondary | ICD-10-CM | POA: Diagnosis present

## 2019-11-21 DIAGNOSIS — I952 Hypotension due to drugs: Secondary | ICD-10-CM | POA: Diagnosis present

## 2019-11-21 DIAGNOSIS — I10 Essential (primary) hypertension: Secondary | ICD-10-CM | POA: Diagnosis present

## 2019-11-21 DIAGNOSIS — T41295A Adverse effect of other general anesthetics, initial encounter: Secondary | ICD-10-CM | POA: Diagnosis present

## 2019-11-21 DIAGNOSIS — J9690 Respiratory failure, unspecified, unspecified whether with hypoxia or hypercapnia: Secondary | ICD-10-CM | POA: Diagnosis not present

## 2019-11-21 DIAGNOSIS — E785 Hyperlipidemia, unspecified: Secondary | ICD-10-CM | POA: Diagnosis present

## 2019-11-21 DIAGNOSIS — Z885 Allergy status to narcotic agent status: Secondary | ICD-10-CM

## 2019-11-21 DIAGNOSIS — R1319 Other dysphagia: Secondary | ICD-10-CM

## 2019-11-21 DIAGNOSIS — M1990 Unspecified osteoarthritis, unspecified site: Secondary | ICD-10-CM | POA: Diagnosis present

## 2019-11-21 DIAGNOSIS — Z811 Family history of alcohol abuse and dependence: Secondary | ICD-10-CM

## 2019-11-21 DIAGNOSIS — F17210 Nicotine dependence, cigarettes, uncomplicated: Secondary | ICD-10-CM | POA: Diagnosis present

## 2019-11-21 LAB — COMPREHENSIVE METABOLIC PANEL
ALT: 20 U/L (ref 0–44)
AST: 52 U/L — ABNORMAL HIGH (ref 15–41)
Albumin: 3.6 g/dL (ref 3.5–5.0)
Alkaline Phosphatase: 114 U/L (ref 38–126)
Anion gap: 9 (ref 5–15)
BUN: 11 mg/dL (ref 8–23)
CO2: 18 mmol/L — ABNORMAL LOW (ref 22–32)
Calcium: 8.8 mg/dL — ABNORMAL LOW (ref 8.9–10.3)
Chloride: 105 mmol/L (ref 98–111)
Creatinine, Ser: 1.14 mg/dL — ABNORMAL HIGH (ref 0.44–1.00)
GFR calc Af Amer: 56 mL/min — ABNORMAL LOW (ref >60)
GFR calc non Af Amer: 48 mL/min — ABNORMAL LOW (ref >60)
Glucose, Bld: 167 mg/dL — ABNORMAL HIGH (ref 70–99)
Potassium: 7.5 mmol/L (ref 3.5–5.1)
Sodium: 132 mmol/L — ABNORMAL LOW (ref 135–145)
Total Bilirubin: 1 mg/dL (ref 0.3–1.2)
Total Protein: 8.3 g/dL — ABNORMAL HIGH (ref 6.5–8.1)

## 2019-11-21 LAB — BLOOD GAS, ARTERIAL
Acid-base deficit: 4.7 mmol/L — ABNORMAL HIGH (ref 0.0–2.0)
Bicarbonate: 20.2 mmol/L (ref 20.0–28.0)
FIO2: 60
O2 Saturation: 90.6 %
Patient temperature: 36.6
pCO2 arterial: 41.9 mmHg (ref 32.0–48.0)
pH, Arterial: 7.31 — ABNORMAL LOW (ref 7.350–7.450)
pO2, Arterial: 66.4 mmHg — ABNORMAL LOW (ref 83.0–108.0)

## 2019-11-21 LAB — CBC
HCT: 43.2 % (ref 36.0–46.0)
Hemoglobin: 13.2 g/dL (ref 12.0–15.0)
MCH: 28.5 pg (ref 26.0–34.0)
MCHC: 30.6 g/dL (ref 30.0–36.0)
MCV: 93.3 fL (ref 80.0–100.0)
Platelets: 288 10*3/uL (ref 150–400)
RBC: 4.63 MIL/uL (ref 3.87–5.11)
RDW: 13.2 % (ref 11.5–15.5)
WBC: 8.2 10*3/uL (ref 4.0–10.5)
nRBC: 0 % (ref 0.0–0.2)

## 2019-11-21 LAB — GLUCOSE, CAPILLARY
Glucose-Capillary: 142 mg/dL — ABNORMAL HIGH (ref 70–99)
Glucose-Capillary: 106 mg/dL — ABNORMAL HIGH (ref 70–99)
Glucose-Capillary: 98 mg/dL (ref 70–99)

## 2019-11-21 LAB — MRSA PCR SCREENING: MRSA by PCR: NEGATIVE

## 2019-11-21 LAB — POTASSIUM: Potassium: 4 mmol/L (ref 3.5–5.1)

## 2019-11-21 SURGERY — COLONOSCOPY WITH PROPOFOL
Anesthesia: General

## 2019-11-21 MED ORDER — BUDESONIDE 0.5 MG/2ML IN SUSP
0.5000 mg | Freq: Two times a day (BID) | RESPIRATORY_TRACT | Status: DC
  Administered 2019-11-21 – 2019-11-23 (×4): 0.5 mg via RESPIRATORY_TRACT
  Filled 2019-11-21 (×4): qty 2

## 2019-11-21 MED ORDER — KETAMINE HCL 50 MG/5ML IJ SOSY
PREFILLED_SYRINGE | INTRAMUSCULAR | Status: AC
  Filled 2019-11-21: qty 5

## 2019-11-21 MED ORDER — CHLORHEXIDINE GLUCONATE 0.12% ORAL RINSE (MEDLINE KIT)
15.0000 mL | Freq: Two times a day (BID) | OROMUCOSAL | Status: DC
  Administered 2019-11-21 – 2019-11-22 (×3): 15 mL via OROMUCOSAL

## 2019-11-21 MED ORDER — CHLORHEXIDINE GLUCONATE CLOTH 2 % EX PADS
6.0000 | MEDICATED_PAD | Freq: Every day | CUTANEOUS | Status: DC
  Administered 2019-11-21 – 2019-11-23 (×3): 6 via TOPICAL

## 2019-11-21 MED ORDER — LOSARTAN POTASSIUM 50 MG PO TABS
100.0000 mg | ORAL_TABLET | Freq: Every day | ORAL | Status: DC
  Administered 2019-11-21: 100 mg via ORAL
  Filled 2019-11-21: qty 2

## 2019-11-21 MED ORDER — SODIUM BICARBONATE-DEXTROSE 150-5 MEQ/L-% IV SOLN
150.0000 meq | INTRAVENOUS | Status: DC
  Administered 2019-11-21: 150 meq via INTRAVENOUS
  Filled 2019-11-21 (×5): qty 1000

## 2019-11-21 MED ORDER — INSULIN ASPART 100 UNIT/ML IV SOLN
10.0000 [IU] | Freq: Once | INTRAVENOUS | Status: AC
  Administered 2019-11-21: 10 [IU] via INTRAVENOUS

## 2019-11-21 MED ORDER — PROPOFOL 1000 MG/100ML IV EMUL
0.0000 ug/kg/min | INTRAVENOUS | Status: DC
  Administered 2019-11-21: 50 ug/kg/min via INTRAVENOUS
  Administered 2019-11-21: 10 ug/kg/min via INTRAVENOUS
  Administered 2019-11-21 (×2): 40 ug/kg/min via INTRAVENOUS
  Administered 2019-11-21 – 2019-11-22 (×4): 50 ug/kg/min via INTRAVENOUS
  Filled 2019-11-21 (×2): qty 100
  Filled 2019-11-21: qty 500

## 2019-11-21 MED ORDER — EPHEDRINE SULFATE-NACL 50-0.9 MG/10ML-% IV SOSY
PREFILLED_SYRINGE | INTRAVENOUS | Status: DC | PRN
  Administered 2019-11-21: 10 mg via INTRAVENOUS

## 2019-11-21 MED ORDER — FUROSEMIDE 40 MG PO TABS
40.0000 mg | ORAL_TABLET | Freq: Every day | ORAL | Status: DC
  Administered 2019-11-21 – 2019-11-23 (×3): 40 mg via ORAL
  Filled 2019-11-21 (×3): qty 1

## 2019-11-21 MED ORDER — ASPIRIN EC 81 MG PO TBEC
81.0000 mg | DELAYED_RELEASE_TABLET | Freq: Every day | ORAL | Status: DC
  Administered 2019-11-21 – 2019-11-23 (×3): 81 mg via ORAL
  Filled 2019-11-21 (×3): qty 1

## 2019-11-21 MED ORDER — ENOXAPARIN SODIUM 40 MG/0.4ML ~~LOC~~ SOLN
40.0000 mg | SUBCUTANEOUS | Status: DC
  Administered 2019-11-21: 40 mg via SUBCUTANEOUS
  Filled 2019-11-21: qty 0.4

## 2019-11-21 MED ORDER — GABAPENTIN 300 MG PO CAPS: 300.0000 mg | ORAL_CAPSULE | Freq: Three times a day (TID) | ORAL | Status: DC | PRN

## 2019-11-21 MED ORDER — LIDOCAINE HCL (CARDIAC) PF 100 MG/5ML IV SOSY
PREFILLED_SYRINGE | INTRAVENOUS | Status: DC | PRN
  Administered 2019-11-21 (×2): 50 mg via INTRATRACHEAL

## 2019-11-21 MED ORDER — DULOXETINE HCL 60 MG PO CPEP
60.0000 mg | ORAL_CAPSULE | Freq: Every day | ORAL | Status: DC
  Administered 2019-11-22 – 2019-11-23 (×2): 60 mg via ORAL
  Filled 2019-11-21 (×2): qty 1

## 2019-11-21 MED ORDER — ESMOLOL HCL 100 MG/10ML IV SOLN
INTRAVENOUS | Status: AC
  Filled 2019-11-21: qty 10

## 2019-11-21 MED ORDER — PANTOPRAZOLE SODIUM 40 MG PO PACK
40.0000 mg | PACK | Freq: Every day | ORAL | Status: DC
  Administered 2019-11-22: 40 mg
  Filled 2019-11-21: qty 20

## 2019-11-21 MED ORDER — VERAPAMIL HCL ER 240 MG PO TBCR
120.0000 mg | EXTENDED_RELEASE_TABLET | Freq: Every day | ORAL | Status: DC
  Administered 2019-11-22 – 2019-11-23 (×2): 120 mg via ORAL
  Filled 2019-11-21 (×2): qty 1

## 2019-11-21 MED ORDER — ONDANSETRON HCL 4 MG PO TABS: 4.0000 mg | ORAL_TABLET | Freq: Four times a day (QID) | ORAL | Status: DC | PRN

## 2019-11-21 MED ORDER — ACETAMINOPHEN 650 MG RE SUPP: 650.0000 mg | Freq: Four times a day (QID) | RECTAL | Status: DC | PRN

## 2019-11-21 MED ORDER — SUCCINYLCHOLINE CHLORIDE 20 MG/ML IJ SOLN
INTRAMUSCULAR | Status: DC | PRN
  Administered 2019-11-21: 140 mg via INTRAVENOUS

## 2019-11-21 MED ORDER — DEXTROSE 50 % IV SOLN
1.0000 | Freq: Once | INTRAVENOUS | Status: AC
  Administered 2019-11-21: 50 mL via INTRAVENOUS
  Filled 2019-11-21: qty 50

## 2019-11-21 MED ORDER — FENTANYL CITRATE (PF) 100 MCG/2ML IJ SOLN
25.0000 ug | INTRAMUSCULAR | Status: DC | PRN
  Administered 2019-11-21 (×2): 50 ug via INTRAVENOUS
  Administered 2019-11-22 (×2): 100 ug via INTRAVENOUS
  Administered 2019-11-22: 25 ug via INTRAVENOUS
  Filled 2019-11-21 (×5): qty 2

## 2019-11-21 MED ORDER — SODIUM BICARBONATE 8.4 % IV SOLN
50.0000 meq | Freq: Once | INTRAVENOUS | Status: AC
  Administered 2019-11-21: 50 meq via INTRAVENOUS
  Filled 2019-11-21: qty 50

## 2019-11-21 MED ORDER — ONDANSETRON HCL 4 MG/2ML IJ SOLN: 4.0000 mg | Freq: Four times a day (QID) | INTRAMUSCULAR | Status: DC | PRN

## 2019-11-21 MED ORDER — ROCURONIUM BROMIDE 100 MG/10ML IV SOLN
INTRAVENOUS | Status: DC | PRN
  Administered 2019-11-21: 50 mg via INTRAVENOUS

## 2019-11-21 MED ORDER — PROPOFOL 500 MG/50ML IV EMUL
INTRAVENOUS | Status: DC | PRN
  Administered 2019-11-21: 125 ug/kg/min via INTRAVENOUS

## 2019-11-21 MED ORDER — LACTATED RINGERS IV SOLN: INTRAVENOUS | Status: DC | PRN

## 2019-11-21 MED ORDER — ESMOLOL HCL 100 MG/10ML IV SOLN
INTRAVENOUS | Status: DC | PRN
  Administered 2019-11-21: 30 mg via INTRAVENOUS

## 2019-11-21 MED ORDER — BUDESONIDE 0.5 MG/2ML IN SUSP: 0.5000 mg | Freq: Two times a day (BID) | RESPIRATORY_TRACT | Status: DC

## 2019-11-21 MED ORDER — SPIRONOLACTONE 25 MG PO TABS
25.0000 mg | ORAL_TABLET | Freq: Every day | ORAL | Status: DC
  Administered 2019-11-21: 25 mg via ORAL
  Filled 2019-11-21: qty 1

## 2019-11-21 MED ORDER — DEXTROSE 10 % IV SOLN: Freq: Once | INTRAVENOUS | Status: AC

## 2019-11-21 MED ORDER — PANTOPRAZOLE SODIUM 40 MG PO TBEC
40.0000 mg | DELAYED_RELEASE_TABLET | Freq: Every day | ORAL | Status: DC
  Administered 2019-11-21: 40 mg via ORAL
  Filled 2019-11-21: qty 1

## 2019-11-21 MED ORDER — CHLORHEXIDINE GLUCONATE CLOTH 2 % EX PADS: 6.0000 | MEDICATED_PAD | Freq: Once | CUTANEOUS | Status: DC

## 2019-11-21 MED ORDER — IPRATROPIUM-ALBUTEROL 0.5-2.5 (3) MG/3ML IN SOLN
3.0000 mL | Freq: Four times a day (QID) | RESPIRATORY_TRACT | Status: DC
  Administered 2019-11-21 – 2019-11-23 (×6): 3 mL via RESPIRATORY_TRACT
  Filled 2019-11-21 (×2): qty 3
  Filled 2019-11-21: qty 6
  Filled 2019-11-21 (×3): qty 3

## 2019-11-21 MED ORDER — FENTANYL CITRATE (PF) 100 MCG/2ML IJ SOLN
25.0000 ug | INTRAMUSCULAR | Status: DC | PRN
  Administered 2019-11-21 (×2): 25 ug via INTRAVENOUS
  Filled 2019-11-21 (×2): qty 2

## 2019-11-21 MED ORDER — PROPOFOL 10 MG/ML IV BOLUS
INTRAVENOUS | Status: DC | PRN
  Administered 2019-11-21: 100 mg via INTRAVENOUS
  Administered 2019-11-21: 20 mg via INTRAVENOUS

## 2019-11-21 MED ORDER — CALCIUM GLUCONATE-NACL 1-0.675 GM/50ML-% IV SOLN
1.0000 g | Freq: Once | INTRAVENOUS | Status: AC
  Administered 2019-11-21: 1000 mg via INTRAVENOUS
  Filled 2019-11-21: qty 50

## 2019-11-21 MED ORDER — LACTATED RINGERS IV SOLN: Freq: Once | INTRAVENOUS | Status: AC

## 2019-11-21 MED ORDER — ORAL CARE MOUTH RINSE
15.0000 mL | OROMUCOSAL | Status: DC
  Administered 2019-11-21 – 2019-11-22 (×9): 15 mL via OROMUCOSAL

## 2019-11-21 MED ORDER — IPRATROPIUM-ALBUTEROL 0.5-2.5 (3) MG/3ML IN SOLN: 3.0000 mL | Freq: Four times a day (QID) | RESPIRATORY_TRACT | Status: DC | PRN

## 2019-11-21 MED ORDER — KETAMINE HCL 10 MG/ML IJ SOLN
INTRAMUSCULAR | Status: DC | PRN
  Administered 2019-11-21: 40 mg via INTRAVENOUS

## 2019-11-21 MED ORDER — ACETAMINOPHEN 325 MG PO TABS
650.0000 mg | ORAL_TABLET | Freq: Four times a day (QID) | ORAL | Status: DC | PRN
  Administered 2019-11-22: 650 mg via ORAL
  Filled 2019-11-21: qty 2

## 2019-11-21 MED ORDER — SODIUM CHLORIDE 0.9 % IV SOLN
1.0000 g | INTRAVENOUS | Status: DC
  Administered 2019-11-21 – 2019-11-22 (×2): 1 g via INTRAVENOUS
  Filled 2019-11-21 (×2): qty 10

## 2019-11-21 NOTE — Progress Notes (Signed)
Megan Odom took patient's purse home with him.

## 2019-11-21 NOTE — Consult Note (Addendum)
PULMONARY / CRITICAL CARE MEDICINE   NAME:  Megan Odom, MRN:  [id number], DOB:  [birth year], LOS: 0 ADMISSION DATE:  11/21/2019, CONSULTATION DATE:  11/21/2019  REFERRING MD:  Roderic Palau ,[place], CHIEF COMPLAINT:  resp distress  BRIEF HISTORY:    74 year old with hep C cirrhosis required intubation after developing respiratory distress during an elective endoscopy procedure  HISTORY OF PRESENT ILLNESS   She has hep C cirrhosis.  She was scheduled for elective EGD for screening of esophageal varices and was also overdue for surveillance colonoscopy.  She has a history of multiple colonic adenomas.  After she was deeply sedated with propofol the scope was inserted and she coughed and desaturated.  Desaturation persisted and she required intubation for airway protection and respiratory insufficiency PCCM was consulted to assist with ventilator management. She was sedated with propofol, currently hypotensive on arrival to the ICU which improved  SIGNIFICANT PAST MEDICAL HISTORY   Hep C cirrhosis Hypertension Bone marrow biopsy 3/19 for leukocytosis>> reactive  SIGNIFICANT EVENTS:   STUDIES:    CT chest noncontrast 3/5 >> Centrilobular and paraseptal emphysema. Pulmonary nodules measure 11.4 mm or less in size, Enlarged pulmonic trunk, indicative of pulmonary arterial hypertension.  Emphysema   CULTURES:  resp 4/1 >>  ANTIBIOTICS:  Ceftriaxone 4/1 >>  LINES/TUBES:   ETT 4/1 >> CONSULTANTS:  GI SUBJECTIVE:    CONSTITUTIONAL: BP 115/79   Pulse (!) 58   Temp (!) 96.9 F (36.1 C) (Axillary)   Resp (!) 22   Ht 5' 5"  (1.651 m)   Wt 113.1 kg   SpO2 100%   BMI 41.49 kg/m   No intake/output data recorded.     Vent Mode: PRVC FiO2 (%):  [50 %-65 %] 50 % Set Rate:  [18 bmp] 18 bmp Vt Set:  [450 mL-460 mL] 460 mL PEEP:  [5 cmH20] 5 cmH20 Plateau Pressure:  [16 cmH20-17 cmH20] 16 cmH20  PHYSICAL EXAM: General: Elderly obese woman, orally intubated, sedated, no  distress Neuro: Sedated, not following commands, RASS -3 HEENT: Pupils 3 mm reactive to light, no icterus, no JVD Cardiovascular: S1-S2 normal Lungs: Bilateral ventilated breath sounds Abdomen: Soft nontender Musculoskeletal: No deformity Skin: No bruising or rash  Chest x-ray 4/1 personally reviewed shows ET tube in position and vascular congestion patchy infiltrate right mid zone  Labs show elevated potassium but will hemolyzed specimen, repeat potassium was 4.0  RESOLVED PROBLEM LIST   ASSESSMENT AND PLAN   Acute respiratory failure following elective procedure likely complicated by aspiration pneumonia Underlying emphysema Probable pulmonary hypertension  -We did a wake-up assessment and she had gagging with another episode of vomiting bilious, which was suctioned out ,OG tube connected to LIS -We will hold off on extubation today -Continue sedation with propofol, goal RASS is -1 intermittent fentanyl -Add empiric ceftriaxone for aspiration, send respiratory culture -We will add Pulmicort and DuoNeb -Obtain echo  NAG metabolic acidosis -likely related to colonic prep -start bicarb gtt @ 50/h  SUMMARY OF TODAY'S PLAN:  Hopefully extubate in next 24 hours  Best Practice / Goals of Care / Disposition.   DVT PROPHYLAXIS: Lovenox SUP: Protonix NUTRITION:npo MOBILITY:bed rest GOALS OF CARE:N/A  FAMILY DISCUSSIONS: s/o updated at bedside DISPOSITION ICU  LABS  Glucose Recent Labs  Lab 11/21/19 1249  GLUCAP 142*    BMET Recent Labs  Lab 11/21/19 1202 11/21/19 1354  NA 132*  --   K 7.5* 4.0  CL 105  --   CO2 18*  --  BUN 11  --   CREATININE 1.14*  --   GLUCOSE 167*  --     Liver Enzymes Recent Labs  Lab 11/21/19 1202  AST 52*  ALT 20  ALKPHOS 114  BILITOT 1.0  ALBUMIN 3.6    Electrolytes Recent Labs  Lab 11/21/19 1202  CALCIUM 8.8*    CBC Recent Labs  Lab 11/21/19 1202  WBC 8.2  HGB 13.2  HCT 43.2  PLT 288    ABG Recent Labs   Lab 11/21/19 1158  PHART 7.310*  PCO2ART 41.9  PO2ART 66.4*    Coag's No results for input(s): APTT, INR in the last 168 hours.  Sepsis Markers No results for input(s): LATICACIDVEN, PROCALCITON, O2SATVEN in the last 168 hours.  Cardiac Enzymes No results for input(s): TROPONINI, PROBNP in the last 168 hours.  PAST MEDICAL HISTORY :   She  has a past medical history of Arthritis, Asthma, Cirrhosis ([place]), Depression, Hep C w/o coma, chronic ([place]), Hyperlipemia, and Hypertension.  PAST SURGICAL HISTORY:  She  has a past surgical history that includes Cholecystectomy; Appendectomy; Abdominal hysterectomy; Breast surgery; and Colonoscopy (N/A, 07/03/2014).  Allergies  Allergen Reactions  . Darvocet [Propoxyphene N-Acetaminophen] Other (See Comments)    Hurts ears and side of face   . Latex Swelling  . Other Other (See Comments)    Hairspray(isoaplus)-causes big soars in head  . Penicillins Other (See Comments)    Has patient had a PCN reaction causing immediate rash, facial/tongue/throat swelling, SOB or lightheadedness with hypotension: No Has patient had a PCN reaction causing severe rash involving mucus membranes or skin necrosis: No Has patient had a PCN reaction that required hospitalization No Has patient had a PCN reaction occurring within the last 10 years: No If all of the above answers are "NO", then may proceed with Cephalosporin use. Hurts ears and side of face   . Tetracyclines & Related Other (See Comments)    Hurts ears and side of face  . Adhesive [Tape] Itching and Rash    Telemetry pads cause itching and rash at site    No current facility-administered medications on file prior to encounter.   Current Outpatient Medications on File Prior to Encounter  Medication Sig  . aspirin EC 81 MG tablet Take 81 mg by mouth daily.    . cholecalciferol (VITAMIN D3) 25 MCG (1000 UNIT) tablet Take 1,000 Units by mouth daily.  . diclofenac (VOLTAREN) 75 MG EC tablet  Take 1 tablet (75 mg total) by mouth 2 (two) times daily. (Patient taking differently: Take 75 mg by mouth 2 (two) times daily as needed for moderate pain. )  . fluticasone (FLONASE) 50 MCG/ACT nasal spray Place 2 sprays into the nose daily as needed for allergies.   . furosemide (LASIX) 40 MG tablet Take 40 mg by mouth daily.    Marland Kitchen gabapentin (NEURONTIN) 300 MG capsule Take 300 mg by mouth 3 (three) times daily as needed (pain).   Marland Kitchen losartan (COZAAR) 100 MG tablet Take 100 mg by mouth daily.  . Omega-3 Fatty Acids (FISH OIL) 1000 MG CAPS Take 1,000 mg by mouth daily.  Marland Kitchen omeprazole (PRILOSEC) 40 MG capsule Take 40 mg by mouth daily.   . potassium chloride (K-DUR) 10 MEQ tablet Take 40 mEq by mouth daily.   Marland Kitchen spironolactone (ALDACTONE) 25 MG tablet Take 25 mg by mouth daily.   . verapamil (CALAN-SR) 120 MG CR tablet Take 120 mg by mouth daily.   Marland Kitchen dexamethasone (DECADRON) 4 MG  tablet Take 1 tablet (4 mg total) by mouth 2 (two) times daily with a meal. (Patient not taking: Reported on 11/12/2019)  . linaclotide (LINZESS) 290 MCG CAPS capsule Take 1 capsule (290 mcg total) by mouth daily before breakfast. (Patient not taking: Reported on 10/14/2019)    FAMILY HISTORY:   Her family history includes Alcohol abuse in her mother and sister; Cancer in her maternal aunt and maternal uncle; Diabetes in her brother and brother; Early death in her father and mother; HIV in her brother; Heart disease in her brother, brother, and maternal aunt; Hepatitis C in her daughter; Hypertension in her maternal aunt, maternal uncle, and son. There is no history of Colon cancer.  SOCIAL HISTORY:  She  reports that she has been smoking. She has a 25.00 pack-year smoking history. She has never used smokeless tobacco. She reports that she does not drink alcohol or use drugs.  REVIEW OF SYSTEMS:    Unable to obtain since intubated and sedated   Kara Mead MD. FCCP. [facility]  If no response to  pager , please call [phone]   11/21/2019

## 2019-11-21 NOTE — Progress Notes (Signed)
Patient came for EGD/Colonoscopy under sedation. After the patient was deeply sedated endoscope was inserted, patient coughed and desaturated. Scope was removed and assisted with  positive pressure ventilation. Patient initially improved and later desaturated despite continuous positive pressure ventilation with mask. Patient was suctioned intermittently, no obvious reflux of contents form stomach. Patient was sedated and intubated with glidescope, ATx1, Breath sounds bilateral equal, saturation 100%, other vital signs stable. Transported to  ICU and endorsed to ICU team. Anesthesia will follow up.

## 2019-11-21 NOTE — Progress Notes (Signed)
CRITICAL VALUE ALERT  Critical Value: Potassium 7.5  Date & Time Notied:  11/21/2019 @1328   Provider Notified: Dr Roderic Palau  Orders Received/Actions taken: No new orders currently

## 2019-11-21 NOTE — H&P (Signed)
@LOGO @   Primary Care Physician:  Rosita Fire, MD Primary Gastroenterologist:  Dr. Gala Romney  Pre-Procedure History & Physical: HPI:  Megan Odom is a 74 y.o. female here for further evaluation of GERD and esophageal dysphagia; history of cirrhosis -in need of screening for esophageal varices.  Also, overdue for surveillance colonoscopy-history of multiple colonic adenomas removed previously.  Patient history of noncompliance and the ongoing pandemic have delayed evaluation.  Past Medical History:  Diagnosis Date  . Arthritis   . Asthma   . Cirrhosis ([place])   . Depression   . Hep C w/o coma, chronic ([place])    eradicated with Harvoni 2015.   Marland Kitchen Hyperlipemia   . Hypertension     Past Surgical History:  Procedure Laterality Date  . ABDOMINAL HYSTERECTOMY    . APPENDECTOMY    . BREAST SURGERY     traumatic injury  . CHOLECYSTECTOMY    . COLONOSCOPY N/A 07/03/2014   Dr. Gala Romney: 5 mm and 1 cm tubular adenomas removed, diverticulosis.  Next colonoscopy in 2018.    Prior to Admission medications   Medication Sig Start Date End Date Taking? Authorizing Provider  albuterol (VENTOLIN HFA) 108 (90 Base) MCG/ACT inhaler Inhale 2 puffs into the lungs every 4 (four) hours as needed for wheezing or shortness of breath. 07/05/19  Yes Estill Dooms, NP  aspirin EC 81 MG tablet Take 81 mg by mouth daily.     Yes [provider]  cholecalciferol (VITAMIN D3) 25 MCG (1000 UNIT) tablet Take 1,000 Units by mouth daily.   Yes [provider]  clotrimazole-betamethasone (LOTRISONE) cream Apply 1 application topically 2 (two) times daily.  07/10/19  Yes [provider]  diclofenac (VOLTAREN) 75 MG EC tablet Take 1 tablet (75 mg total) by mouth 2 (two) times daily. Patient taking differently: Take 75 mg by mouth 2 (two) times daily as needed for moderate pain.  03/21/17  Yes Lily Kocher, PA-C  DULoxetine (CYMBALTA) 60 MG capsule Take 60 mg by mouth daily.  07/28/19   Yes [provider]  fluticasone (FLONASE) 50 MCG/ACT nasal spray Place 2 sprays into the nose daily as needed for allergies.    Yes [provider]  furosemide (LASIX) 40 MG tablet Take 40 mg by mouth daily.     Yes [provider]  gabapentin (NEURONTIN) 300 MG capsule Take 300 mg by mouth 3 (three) times daily as needed (pain).  06/24/16  Yes [provider]  HYDROcodone-acetaminophen (NORCO) 7.5-325 MG tablet Take 1-2 tablets by mouth in the morning and at bedtime.  08/09/19  Yes [provider]  ipratropium-albuterol (DUONEB) 0.5-2.5 (3) MG/3ML SOLN Take 3 mLs by nebulization every 6 (six) hours as needed (shortness of breath or wheezing).  04/20/19  Yes [provider]  ketoconazole (NIZORAL) 2 % cream Apply 1 application topically daily as needed for irritation.  08/08/19  Yes [provider]  losartan (COZAAR) 100 MG tablet Take 100 mg by mouth daily.   Yes [provider]  Omega-3 Fatty Acids (FISH OIL) 1000 MG CAPS Take 1,000 mg by mouth daily.   Yes [provider]  omeprazole (PRILOSEC) 40 MG capsule Take 40 mg by mouth daily.    Yes [provider]  potassium chloride (K-DUR) 10 MEQ tablet Take 40 mEq by mouth daily.    Yes [provider]  spironolactone (ALDACTONE) 25 MG tablet Take 25 mg by mouth daily.  10/16/18  Yes [provider]  verapamil (  CALAN-SR) 120 MG CR tablet Take 120 mg by mouth daily.  06/23/16  Yes [provider]  dexamethasone (DECADRON) 4 MG tablet Take 1 tablet (4 mg total) by mouth 2 (two) times daily with a meal. Patient not taking: Reported on 11/12/2019 04/26/19   Lily Kocher, PA-C  linaclotide Rolan Lipa) 290 MCG CAPS capsule Take 1 capsule (290 mcg total) by mouth daily before breakfast. Patient not taking: Reported on 10/14/2019 05/10/19   Mahala Menghini, PA-C    Allergies as of 06/25/2019 - Review Complete 05/17/2019  Allergen Reaction Noted  .  Darvocet [propoxyphene n-acetaminophen] Other (See Comments) 02/26/2011  . Latex Swelling 04/24/2012  . Other Other (See Comments) 02/13/2013  . Penicillins Other (See Comments) 02/26/2011  . Tetracyclines & related Other (See Comments) 02/26/2011  . Adhesive [tape] Itching and Rash 12/04/2012    Family History  Problem Relation Age of Onset  . Alcohol abuse Mother   . Early death Mother   . Early death Father   . Alcohol abuse Sister   . HIV Brother   . Diabetes Brother   . Heart disease Brother   . Hepatitis C Daughter   . Hypertension Son   . Heart disease Brother   . Diabetes Brother   . Cancer Maternal Aunt        stomach  . Hypertension Maternal Aunt   . Heart disease Maternal Aunt   . Cancer Maternal Uncle        prostate  . Hypertension Maternal Uncle   . Colon cancer Neg Hx     Social History   Socioeconomic History  . Marital status: Widowed    Spouse name: Not on file  . Number of children: 2  . Years of education: GED  . Highest education level: Not on file  Occupational History  . 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 Smoking status: Current Every Day Smoker    Packs/day: 0.50    Years: 50.00    Pack years: 25.00  . Smokeless tobacco: Never Used  . Tobacco comment: one pack every 2-3 days  Substance and Sexual Activity  . Alcohol use: No    Alcohol/week: 0.0 standard drinks  . Drug use: No  . Sexual activity: Not Currently    Birth control/protection: Surgical    Comment: hyst  Other Topics Concern  . Not on file  Social History Narrative  . Not on file   Social Determinants of Health   Financial Resource Strain: High Risk  . Difficulty of Paying Living Expenses: Hard  Food Insecurity: No Food Insecurity  . Worried About Charity fundraiser in the Last Year: Never true  . Ran Out of Food in the Last Year: Never true  Transportation Needs: No Transportation Needs  . Lack of Transportation (Medical): No  . Lack of  Transportation (Non-Medical): No  Physical Activity: Inactive  . Days of Exercise per Week: 0 days  . Minutes of Exercise per Session: 0 min  Stress: Stress Concern Present  . Feeling of Stress : To some extent  Social Connections: Somewhat Isolated  . Frequency of Communication with Friends and Family: Three times a week  . Frequency of Social Gatherings with Friends and Family: Three times a week  . Attends Religious Services: More than 4 times per year  . Active Member of Clubs or Organizations: No  . Attends Archivist Meetings: Never  . Marital Status: Widowed  Intimate Partner Violence: Not  At Risk  . Fear of Current or Ex-Partner: No  . Emotionally Abused: No  . Physically Abused: No  . Sexually Abused: No    Review of Systems: See HPI, otherwise negative ROS  Physical Exam: Pulse (!) 54   Temp 98 F (36.7 C) (Oral)   Resp 18  General:   Alert,  Well-developed, well-nourished, pleasant and cooperative in NAD Neck:  Supple; no masses or thyromegaly. No significant cervical adenopathy. Lungs:  Clear throughout to auscultation.   No wheezes, crackles, or rhonchi. No acute distress. Heart:  Regular rate and rhythm; no murmurs, clicks, rubs,  or gallops. Abdomen: Non-distended, normal bowel sounds.  Soft and nontender without appreciable mass or hepatosplenomegaly.  Pulses:  Normal pulses noted. Extremities:  Without clubbing or edema.  Impression/Plan: 74 year old lady with HCV related cirrhosis;  status post eradication of HCV.  Cirrhosis suggested on cross-sectional imaging.  Has GERD and esophageal dysphagia.  Overdue for evaluation.  Overdue for screening EGD for esophageal varices.    History of colonic adenomas-multiple; overdue for surveillance colonoscopy as described above  I have offered the patient an EGD with esophageal dilation as feasible/appropriate per plan of the day as well as a surveillance colonoscopy.  The risks, benefits, limitations,  imponderables and alternatives regarding both EGD and colonoscopy have been reviewed with the patient. Questions have been answered. All parties agreeable.      Notice: This dictation was prepared with Dragon dictation along with smaller phrase technology. Any transcriptional errors that result from this process are unintentional and may not be corrected upon review.

## 2019-11-21 NOTE — Progress Notes (Signed)
   Patient's belongings taken to ICU room 10 and given to nurse. This included her purse and a bag of clothes and shoes

## 2019-11-21 NOTE — Progress Notes (Addendum)
Patient placed under MAC in preparation for an EGD.  Developed apparent laryngeal spasm with drop in her saturation prior to beginning EGD/TCS.Marland Kitchen  Attended to by anesthesia staff.  Anesthesia discontinued.  Patient continued to struggle to breathe.  Dr. Modesta Messing elected to intubate.  EGD and colonoscopy canceled.  Patient will need to be admitted to the ICU.  I discussed at length with Dr. Irwin Brakeman who has kindly accepted the patient for inpatient management.  I have also discussed with Luna Kitchens at [phone] and updated him on patient's status.

## 2019-11-21 NOTE — Anesthesia Procedure Notes (Signed)
Procedure Name: Intubation Date/Time: 11/21/2019 9:59 AM Performed by: Hewitt Blade, CRNA Pre-anesthesia Checklist: Patient identified, Emergency Drugs available, Suction available and Patient being monitored Patient Re-evaluated:Patient Re-evaluated prior to induction Oxygen Delivery Method: Circle system utilized Preoxygenation: Pre-oxygenation with 100% oxygen Induction Type: IV induction Ventilation: Oral airway inserted - appropriate to patient size and Mask ventilation with difficulty Laryngoscope Size: Glidescope Grade View: Grade I Tube type: Oral Tube size: 7.5 mm Number of attempts: 1 Airway Equipment and Method: Rigid stylet Placement Confirmation: ETT inserted through vocal cords under direct vision,  positive ETCO2 and breath sounds checked- equal and bilateral Secured at: 21 cm Tube secured with: Tape Dental Injury: Teeth and Oropharynx as per pre-operative assessment

## 2019-11-21 NOTE — Progress Notes (Signed)
Pt had woken up and became restless and agitated. Attempted to explain to pt what was going on however pt continued to shake head no and became more agitated. Prn 25 mcg fentanyl administered. Pt now resting. Significant other present in room and was updated on plan of care. Will continue to monitor pt.

## 2019-11-21 NOTE — Transfer of Care (Signed)
Immediate Anesthesia Transfer of Care Note  Patient: Megan Odom  Procedure(s) Performed: COLONOSCOPY WITH PROPOFOL (N/A ) ESOPHAGOGASTRODUODENOSCOPY (EGD) WITH PROPOFOL (N/A ) MALONEY DILATION (N/A ) ESOPHAGEAL BANDING (N/A )  Patient Location: ICU  Anesthesia Type:General  Level of Consciousness: sedated and Patient remains intubated per anesthesia plan  Airway & Oxygen Therapy: Patient remains intubated per anesthesia plan  Post-op Assessment: Report given to RN  Post vital signs: Reviewed  Last Vitals:  Vitals Value Taken Time  BP 196/88 11/21/19 1030  Temp    Pulse 73 11/21/19 1039  Resp 18 11/21/19 1039  SpO2 98 % 11/21/19 1039  Vitals shown include unvalidated device data.  Last Pain:  Vitals:   11/21/19 0800  TempSrc: Oral  PainSc: 0-No pain      Patients Stated Pain Goal: 10 ([id number])  Complications: No apparent anesthesia complications

## 2019-11-21 NOTE — Anesthesia Preprocedure Evaluation (Addendum)
Anesthesia Evaluation  Patient identified by MRN, date of birth, ID band Patient awake    Reviewed: Allergy & Precautions, NPO status , Patient's Chart, lab work & pertinent test results  Airway Mallampati: II  TM Distance: >3 FB     Dental  (+) Edentulous Upper, Missing, Dental Advisory Given   Pulmonary shortness of breath and with exertion, asthma , sleep apnea , COPD,  COPD inhaler, Current SmokerPatient did not abstain from smoking.,    Pulmonary exam normal breath sounds clear to auscultation       Cardiovascular Exercise Tolerance: Poor hypertension, Pt. on medications +CHF and + DOE  Normal cardiovascular exam Rhythm:Regular Rate:Normal     Neuro/Psych  Headaches, PSYCHIATRIC DISORDERS Anxiety Depression    GI/Hepatic GERD  Medicated and Controlled,(+) Cirrhosis       , Hepatitis -, C  Endo/Other  Morbid obesity  Renal/GU Renal disease     Musculoskeletal  (+) Arthritis ,   Abdominal   Peds  Hematology   Anesthesia Other Findings   Reproductive/Obstetrics                           Anesthesia Physical Anesthesia Plan  ASA: IV  Anesthesia Plan: General   Post-op Pain Management:    Induction: Intravenous  PONV Risk Score and Plan: 2 and Treatment may vary due to age or medical condition  Airway Management Planned: Nasal Cannula, Natural Airway and Simple Face Mask  Additional Equipment:   Intra-op Plan:   Post-operative Plan:   Informed Consent: I have reviewed the patients History and Physical, chart, labs and discussed the procedure including the risks, benefits and alternatives for the proposed anesthesia with the patient or authorized representative who has indicated his/her understanding and acceptance.     Dental advisory given  Plan Discussed with: CRNA  Anesthesia Plan Comments:         Anesthesia Quick Evaluation

## 2019-11-21 NOTE — H&P (Signed)
History and Physical    [name] [id number] DOB: 07-23-1946 DOA: 11/21/2019  PCP: Rosita Fire, MD  Patient coming from: [name]  I have personally briefly reviewed patient's old medical records in [place]  Chief Complaint: Shortness of breath  HPI: Megan Odom is a 74 y.o. female with medical history significant of hep C cirrhosis, hypertension, who presented for elective procedure at any pain hospital.  Plans were to have endoscopy done for esophageal varices screening as well as evaluation of dysphagia/GERD.  She was also to have a screening colonoscopy.  Patient was prepped for the procedure and received propofol for sedation.  After receiving propofol, she became acutely short of breath.  Anesthesia was called and elected to intubate the patient.  Patient presented to provide further history since she is currently intubated and sedated.  Information is obtained from medical record.  Review of Systems: Unable to assess due to mental status.    Past Medical History:  Diagnosis Date  . Arthritis   . Asthma   . Cirrhosis ([place])   . Depression   . Hep C w/o coma, chronic ([place])    eradicated with Harvoni 2015.   Marland Kitchen Hyperlipemia   . Hypertension     Past Surgical History:  Procedure Laterality Date  . ABDOMINAL HYSTERECTOMY    . APPENDECTOMY    . BREAST SURGERY     traumatic injury  . CHOLECYSTECTOMY    . COLONOSCOPY N/A 07/03/2014   Dr. Gala Romney: 5 mm and 1 cm tubular adenomas removed, diverticulosis.  Next colonoscopy in 2018.    Social History:  reports that she has been smoking. She has a 25.00 pack-year smoking history. She has never used smokeless tobacco. She reports that she does not drink alcohol or use drugs.  Allergies  Allergen Reactions  . Darvocet [Propoxyphene N-Acetaminophen] Other (See Comments)    Hurts ears and side of face   . Latex Swelling  . Other Other (See Comments)    Hairspray(isoaplus)-causes big soars in  head  . Penicillins Other (See Comments)    Has patient had a PCN reaction causing immediate rash, facial/tongue/throat swelling, SOB or lightheadedness with hypotension: No Has patient had a PCN reaction causing severe rash involving mucus membranes or skin necrosis: No Has patient had a PCN reaction that required hospitalization No Has patient had a PCN reaction occurring within the last 10 years: No If all of the above answers are "NO", then may proceed with Cephalosporin use. Hurts ears and side of face   . Tetracyclines & Related Other (See Comments)    Hurts ears and side of face  . Adhesive [Tape] Itching and Rash    Telemetry pads cause itching and rash at site    Family History  Problem Relation Age of Onset  . Alcohol abuse Mother   . Early death Mother   . Early death Father   . Alcohol abuse Sister   . HIV Brother   . Diabetes Brother   . Heart disease Brother   . Hepatitis C Daughter   . Hypertension Son   . Heart disease Brother   . Diabetes Brother   . Cancer Maternal Aunt        stomach  . Hypertension Maternal Aunt   . Heart disease Maternal Aunt   . Cancer Maternal Uncle        prostate  . Hypertension Maternal Uncle   . Colon cancer Neg Hx  Prior to Admission medications   Medication Sig Start Date End Date Taking? Authorizing Provider  albuterol (VENTOLIN HFA) 108 (90 Base) MCG/ACT inhaler Inhale 2 puffs into the lungs every 4 (four) hours as needed for wheezing or shortness of breath. 07/05/19  Yes Estill Dooms, NP  aspirin EC 81 MG tablet Take 81 mg by mouth daily.     Yes [provider]  cholecalciferol (VITAMIN D3) 25 MCG (1000 UNIT) tablet Take 1,000 Units by mouth daily.   Yes [provider]  clotrimazole-betamethasone (LOTRISONE) cream Apply 1 application topically 2 (two) times daily.  07/10/19  Yes [provider]  diclofenac (VOLTAREN) 75 MG EC tablet Take 1 tablet (75 mg total) by mouth 2 (two) times  daily. Patient taking differently: Take 75 mg by mouth 2 (two) times daily as needed for moderate pain.  03/21/17  Yes Lily Kocher, PA-C  DULoxetine (CYMBALTA) 60 MG capsule Take 60 mg by mouth daily.  07/28/19  Yes [provider]  fluticasone (FLONASE) 50 MCG/ACT nasal spray Place 2 sprays into the nose daily as needed for allergies.    Yes [provider]  furosemide (LASIX) 40 MG tablet Take 40 mg by mouth daily.     Yes [provider]  gabapentin (NEURONTIN) 300 MG capsule Take 300 mg by mouth 3 (three) times daily as needed (pain).  06/24/16  Yes [provider]  HYDROcodone-acetaminophen (NORCO) 7.5-325 MG tablet Take 1-2 tablets by mouth in the morning and at bedtime.  08/09/19  Yes [provider]  ipratropium-albuterol (DUONEB) 0.5-2.5 (3) MG/3ML SOLN Take 3 mLs by nebulization every 6 (six) hours as needed (shortness of breath or wheezing).  04/20/19  Yes [provider]  ketoconazole (NIZORAL) 2 % cream Apply 1 application topically daily as needed for irritation.  08/08/19  Yes [provider]  losartan (COZAAR) 100 MG tablet Take 100 mg by mouth daily.   Yes [provider]  Omega-3 Fatty Acids (FISH OIL) 1000 MG CAPS Take 1,000 mg by mouth daily.   Yes [provider]  omeprazole (PRILOSEC) 40 MG capsule Take 40 mg by mouth daily.    Yes [provider]  potassium chloride (K-DUR) 10 MEQ tablet Take 40 mEq by mouth daily.    Yes [provider]  spironolactone (ALDACTONE) 25 MG tablet Take 25 mg by mouth daily.  10/16/18  Yes [provider]  verapamil (CALAN-SR) 120 MG CR tablet Take 120 mg by mouth daily.  06/23/16  Yes [provider]  dexamethasone (DECADRON) 4 MG tablet Take 1 tablet (4 mg total) by mouth 2 (two) times daily with a meal. Patient not taking: Reported on 11/12/2019 04/26/19   Lily Kocher, PA-C  linaclotide Rolan Lipa) 290 MCG CAPS capsule Take 1 capsule  (290 mcg total) by mouth daily before breakfast. Patient not taking: Reported on 10/14/2019 05/10/19   Mahala Menghini, PA-C    Physical Exam: Vitals:   11/21/19 0800 11/21/19 1018 11/21/19 1028  Pulse: (!) 54    Resp: 18    Temp: 98 F (36.7 C)  97.8 F (36.6 C)  TempSrc: Oral  Axillary  SpO2:  96%   Weight:   113.1 kg  Height:  5\' 5"  (1.651 m) 5\' 5"  (1.651 m)    Constitutional: Intubated and sedated Eyes: PERRL, lids and conjunctivae normal ENMT: Mucous membranes are moist. Posterior pharynx clear of any exudate or lesions.Normal dentition.  Neck: normal, supple, no masses, no thyromegaly Respiratory: Bilateral  rhonchi with mild wheeze. Normal respiratory effort. No accessory muscle use.  Cardiovascular: Regular rate and rhythm, no murmurs / rubs / gallops.  Trace extremity edema. 2+ pedal pulses. No carotid bruits.  Abdomen: no tenderness, no masses palpated. No hepatosplenomegaly. Bowel sounds positive.  Musculoskeletal: no clubbing / cyanosis. No joint deformity upper and lower extremities. Good ROM, no contractures. Normal muscle tone.  Skin: no rashes, lesions, ulcers. No induration Neurologic: Limited exam due to mental status.  No gross neurologic deficits Psychiatric: Unable to assess due to mental status.    Labs on Admission: I have personally reviewed following labs and imaging studies  CBC: No results for input(s): WBC, NEUTROABS, HGB, HCT, MCV, PLT in the last 168 hours. Basic Metabolic Panel: No results for input(s): NA, K, CL, CO2, GLUCOSE, BUN, CREATININE, CALCIUM, MG, PHOS in the last 168 hours. GFR: CrCl cannot be calculated (Patient's most recent lab result is older than the maximum 21 days allowed.). Liver Function Tests: No results for input(s): AST, ALT, ALKPHOS, BILITOT, PROT, ALBUMIN in the last 168 hours. No results for input(s): LIPASE, AMYLASE in the last 168 hours. No results for input(s): AMMONIA in the last 168 hours. Coagulation Profile: No  results for input(s): INR, PROTIME in the last 168 hours. Cardiac Enzymes: No results for input(s): CKTOTAL, CKMB, CKMBINDEX, TROPONINI in the last 168 hours. BNP (last 3 results) No results for input(s): PROBNP in the last 8760 hours. HbA1C: No results for input(s): HGBA1C in the last 72 hours. CBG: No results for input(s): GLUCAP in the last 168 hours. Lipid Profile: No results for input(s): CHOL, HDL, LDLCALC, TRIG, CHOLHDL, LDLDIRECT in the last 72 hours. Thyroid Function Tests: No results for input(s): TSH, T4TOTAL, FREET4, T3FREE, THYROIDAB in the last 72 hours. Anemia Panel: No results for input(s): VITAMINB12, FOLATE, FERRITIN, TIBC, IRON, RETICCTPCT in the last 72 hours. Urine analysis:    Component Value Date/Time   COLORURINE YELLOW 05/11/2018 1700   APPEARANCEUR CLEAR 05/11/2018 1700   LABSPEC 1.010 05/11/2018 1700   PHURINE 6.0 05/11/2018 1700   GLUCOSEU NEGATIVE 05/11/2018 1700   HGBUR NEGATIVE 05/11/2018 1700   BILIRUBINUR NEGATIVE 05/11/2018 1700   KETONESUR NEGATIVE 05/11/2018 1700   PROTEINUR NEGATIVE 05/11/2018 1700   UROBILINOGEN 2.0 (H) 12/09/2014 1200   NITRITE NEGATIVE 05/11/2018 1700   LEUKOCYTESUR NEGATIVE 05/11/2018 1700    Radiological Exams on Admission: DG CHEST PORT 1 VIEW  Result Date: 11/21/2019 CLINICAL DATA:  Intubation. EXAM: PORTABLE CHEST 1 VIEW COMPARISON:  Chest x-ray 09/01/2018 FINDINGS: The endotracheal tube is 2.5 cm above the carina. The NG tube is coursing down the esophagus and into the stomach. The heart is borderline enlarged but appears stable. There is central vascular congestion without overt pulmonary edema. Patchy infiltrates versus atelectasis. No pleural effusions or pneumothorax. IMPRESSION: The endotracheal tube is 2.5 cm above the carina. The NG tube is in the stomach. Central vascular congestion without overt pulmonary edema. Patchy infiltrates or atelectasis. Electronically Signed   By: Marijo Sanes M.D.   On: 11/21/2019  10:46    Assessment/Plan Active Problems:   Essential hypertension   Hepatic cirrhosis (H[facility])   Dysphagia   History of hepatitis C   GERD (gastroesophageal reflux disease)   Acute respiratory failure ([place])     1. Acute respiratory failure.  Patient became acutely short of breath after receiving propofol for procedure.  She was intubated in the endoscopy suite.  Will check chest x-ray, blood gas.  Consult pulmonology to assist with vent  management.  There is some mention of a possible laryngeal spasm leading to respiratory failure. 2. GERD with dysphagia.  Elective endoscopy that was planned for today will be rescheduled as an outpatient.  Continue on PPI 3. Hep C cirrhosis.  Continue on diuretics.  Further screening for varices will be rescheduled as an outpatient 4. Hypertension.  Continue home regimen  DVT prophylaxis: Lovenox Code Status: Full code Family Communication: Updated friend, Luna Kitchens Disposition Plan: discharge home once able to come off vent  Consults called: pulmonology  Admission status: inpatient, icu   Kathie Dike MD [facility]  Critical care procedure note Authorized and performed by: Kathie Dike Total critical care time: Approximately 35 minutes Due to high probability of clinically significant, life-threatening deterioration, the patient required my highest level of preparedness to intervene emergently and I personally spent this critical care time directly and personally managing the patient.  The critical care time included obtaining a history, examining the patient, pulse oximetry, ordering and review of studies, arranging urgent treatment with development of a management plan, evaluation of patient's response to treatment, frequent reassessment, discussions with other providers.  Critical care time was performed to assess and manage the high probability of imminent, life-threatening deterioration that could result in multiorgan failure.  It was  exclusive of separate billable procedures and treating other patients and teaching time.  Please see MDM section and the rest of the of note for further information on patient assessment and treatment   If 7PM-7AM, please contact night-coverage [email or web address]   11/21/2019, 11:42 AM

## 2019-11-22 ENCOUNTER — Inpatient Hospital Stay (HOSPITAL_COMMUNITY): Payer: Medicare HMO

## 2019-11-22 ENCOUNTER — Other Ambulatory Visit: Payer: Self-pay

## 2019-11-22 DIAGNOSIS — J690 Pneumonitis due to inhalation of food and vomit: Secondary | ICD-10-CM

## 2019-11-22 DIAGNOSIS — I340 Nonrheumatic mitral (valve) insufficiency: Secondary | ICD-10-CM

## 2019-11-22 DIAGNOSIS — I361 Nonrheumatic tricuspid (valve) insufficiency: Secondary | ICD-10-CM

## 2019-11-22 LAB — BASIC METABOLIC PANEL
Anion gap: 13 (ref 5–15)
BUN: 14 mg/dL (ref 8–23)
CO2: 25 mmol/L (ref 22–32)
Calcium: 8.8 mg/dL — ABNORMAL LOW (ref 8.9–10.3)
Chloride: 100 mmol/L (ref 98–111)
Creatinine, Ser: 1.56 mg/dL — ABNORMAL HIGH (ref 0.44–1.00)
GFR calc Af Amer: 38 mL/min — ABNORMAL LOW (ref >60)
GFR calc non Af Amer: 33 mL/min — ABNORMAL LOW (ref >60)
Glucose, Bld: 102 mg/dL — ABNORMAL HIGH (ref 70–99)
Potassium: 3.8 mmol/L (ref 3.5–5.1)
Sodium: 138 mmol/L (ref 135–145)

## 2019-11-22 LAB — GLUCOSE, CAPILLARY
Glucose-Capillary: 80 mg/dL (ref 70–99)
Glucose-Capillary: 85 mg/dL (ref 70–99)
Glucose-Capillary: 88 mg/dL (ref 70–99)
Glucose-Capillary: 92 mg/dL (ref 70–99)

## 2019-11-22 LAB — TRIGLYCERIDES: Triglycerides: 92 mg/dL (ref <150)

## 2019-11-22 LAB — PHOSPHORUS: Phosphorus: 5.4 mg/dL — ABNORMAL HIGH (ref 2.5–4.6)

## 2019-11-22 LAB — CBC
HCT: 41 % (ref 36.0–46.0)
Hemoglobin: 12.4 g/dL (ref 12.0–15.0)
MCH: 28.5 pg (ref 26.0–34.0)
MCHC: 30.2 g/dL (ref 30.0–36.0)
MCV: 94.3 fL (ref 80.0–100.0)
Platelets: 242 10*3/uL (ref 150–400)
RBC: 4.35 MIL/uL (ref 3.87–5.11)
RDW: 13.1 % (ref 11.5–15.5)
WBC: 11.3 10*3/uL — ABNORMAL HIGH (ref 4.0–10.5)
nRBC: 0 % (ref 0.0–0.2)

## 2019-11-22 LAB — MAGNESIUM: Magnesium: 1.5 mg/dL — ABNORMAL LOW (ref 1.7–2.4)

## 2019-11-22 LAB — ECHOCARDIOGRAM COMPLETE
Height: 65 in
Weight: 3982.39 oz

## 2019-11-22 MED ORDER — GUAIFENESIN-DM 100-10 MG/5ML PO SYRP
5.0000 mL | ORAL_SOLUTION | ORAL | Status: DC | PRN
  Administered 2019-11-23: 5 mL via ORAL
  Filled 2019-11-22: qty 5

## 2019-11-22 MED ORDER — ENOXAPARIN SODIUM 60 MG/0.6ML ~~LOC~~ SOLN: 55.0000 mg | SUBCUTANEOUS | Status: DC

## 2019-11-22 MED ORDER — MAGNESIUM SULFATE 2 GM/50ML IV SOLN
2.0000 g | Freq: Once | INTRAVENOUS | Status: AC
  Administered 2019-11-22: 2 g via INTRAVENOUS
  Filled 2019-11-22: qty 50

## 2019-11-22 MED ORDER — NICOTINE 21 MG/24HR TD PT24
21.0000 mg | MEDICATED_PATCH | Freq: Every day | TRANSDERMAL | Status: DC
  Administered 2019-11-22 – 2019-11-23 (×2): 21 mg via TRANSDERMAL
  Filled 2019-11-22 (×2): qty 1

## 2019-11-22 MED ORDER — ORAL CARE MOUTH RINSE
15.0000 mL | Freq: Two times a day (BID) | OROMUCOSAL | Status: DC
  Administered 2019-11-22 – 2019-11-23 (×2): 15 mL via OROMUCOSAL

## 2019-11-22 MED ORDER — TRAZODONE HCL 50 MG PO TABS
50.0000 mg | ORAL_TABLET | Freq: Every evening | ORAL | Status: DC | PRN
  Administered 2019-11-22: 50 mg via ORAL
  Filled 2019-11-22: qty 1

## 2019-11-22 MED ORDER — HYDROCODONE-ACETAMINOPHEN 7.5-325 MG PO TABS
1.0000 | ORAL_TABLET | Freq: Four times a day (QID) | ORAL | Status: DC | PRN
  Administered 2019-11-22 – 2019-11-23 (×2): 2 via ORAL
  Filled 2019-11-22 (×2): qty 2

## 2019-11-22 MED ORDER — PHENOL 1.4 % MT LIQD
1.0000 | OROMUCOSAL | Status: DC | PRN
  Administered 2019-11-22: 1 via OROMUCOSAL
  Filled 2019-11-22: qty 177

## 2019-11-22 NOTE — Progress Notes (Signed)
Patient was on the ventilator CPAP/PS 5/5 40% FIO2 with SATs 99% and exhaled Vt 562-650, HR 63 and RR  24 and BP 141/68.Patient alert and following commands, RN and MD at bedside. RT received orders to extubate patient, patient tolerating well no complications noted. Rt placed patient on 4L O2 nasal cannula. Patient maintaining SATs 94% , HR 61 and BP 122/49. RT will continue to monitor and assess.

## 2019-11-22 NOTE — Progress Notes (Signed)
Bedside swallow screen performed with no complications. Pt able to swallow po medications. Throat is a little sore. Chloraseptic throat spray administered.

## 2019-11-22 NOTE — Progress Notes (Signed)
*  PRELIMINARY RESULTS* Echocardiogram 2D Echocardiogram has been performed.  Megan Odom 11/22/2019, 3:11 PM

## 2019-11-22 NOTE — Anesthesia Postprocedure Evaluation (Signed)
Anesthesia Post Note  Patient: Megan Odom  Procedure(s) Performed: COLONOSCOPY WITH PROPOFOL (N/A ) ESOPHAGOGASTRODUODENOSCOPY (EGD) WITH PROPOFOL (N/A ) MALONEY DILATION (N/A ) ESOPHAGEAL BANDING (N/A )  Patient location during evaluation: ICU Anesthesia Type: General Level of consciousness: awake and alert and oriented Pain management: pain level controlled Vital Signs Assessment: post-procedure vital signs reviewed and stable Respiratory status: spontaneous breathing and respiratory function stable Cardiovascular status: blood pressure returned to baseline Postop Assessment: no apparent nausea or vomiting (c/o sore throat) Anesthetic complications: no (c/o sore throat) Comments: Patient was extubated this morning, vital signs stable.     Last Vitals:  Vitals:   11/22/19 1400 11/22/19 1432  BP:    Pulse: 81   Resp: (!) 24   Temp:    SpO2: 100% 98%    Last Pain:  Vitals:   11/22/19 1256  TempSrc:   PainSc: 4                  Kathaleen Dudziak C Ivi Griffith

## 2019-11-22 NOTE — Progress Notes (Signed)
PULMONARY / CRITICAL CARE MEDICINE   NAME:  Megan Odom, MRN:  [id number], DOB:  Dec 25, 1945, LOS: 1 ADMISSION DATE:  11/21/2019, CONSULTATION DATE:  11/22/2019  REFERRING MD:  Roderic Palau ,[facility], CHIEF COMPLAINT:  resp distress  BRIEF HISTORY:    74 year old with hep C cirrhosis required intubation after developing respiratory distress during an elective endoscopy procedure  HISTORY OF PRESENT ILLNESS   She has hep C cirrhosis.  She was scheduled for elective EGD for screening of esophageal varices and was also overdue for surveillance colonoscopy.  She has a history of multiple colonic adenomas.  After she was deeply sedated with propofol the scope was inserted and she coughed and desaturated.  Desaturation persisted and she required intubation for airway protection and respiratory insufficiency PCCM was consulted to assist with ventilator management. She was sedated with propofol, currently hypotensive on arrival to the ICU which improved  SIGNIFICANT PAST MEDICAL HISTORY   Hep C cirrhosis Hypertension Bone marrow biopsy 3/19 for leukocytosis>> reactive  SIGNIFICANT EVENTS:   STUDIES:    CT chest noncontrast 3/5 >> Centrilobular and paraseptal emphysema. Pulmonary nodules measure 11.4 mm or less in size, Enlarged pulmonic trunk, indicative of pulmonary arterial hypertension.  Emphysema   CULTURES:  resp 4/1 >>  ANTIBIOTICS:  Ceftriaxone 4/1 >>  LINES/TUBES:   ETT 4/1 >> 4/2 CONSULTANTS:  GI SUBJECTIVE:   She tolerated spontaneous breathing trial this morning and was extubated Weaned off oxygen to room air Complains of sore throat  CONSTITUTIONAL: BP (!) 150/82   Pulse 81   Temp 98.7 F (37.1 C)   Resp (!) 24   Ht 5' 5"  (1.651 m)   Wt 112.9 kg   SpO2 98%   BMI 41.42 kg/m   I/O last 3 completed shifts: In: 1370.1 [I.V.:1227.3; IV Piggyback:142.8] Out: 1350 [Urine:1050; Emesis/NG output:300]     Vent Mode: CPAP FiO2 (%):  [40 %-50 %] 40 % Set Rate:  [18 bmp]  18 bmp Vt Set:  [460 mL] 460 mL PEEP:  [5 cmH20] 5 cmH20 Pressure Support:  [5 cmH20] 5 cmH20 Plateau Pressure:  [11 cmH20-18 cmH20] 11 cmH20  PHYSICAL EXAM: General: Elderly obese woman,  no distress Neuro: Alert, interactive, nonfocal HEENT: Pupils 3 mm reactive to light, no icterus, no JVD Cardiovascular: S1-S2 normal Lungs: Bilateral clear breath sounds Abdomen: Soft nontender Musculoskeletal: No deformity Skin: No bruising or rash  Chest x-ray 4/2 personally reviewed shows ET tube in position and increased bibasilar infiltrates  Labs show low potassium and bicarb, slight increase in creatinine to 1.5  RESOLVED PROBLEM LIST   ASSESSMENT AND PLAN   Acute respiratory failure following elective procedure likely complicated by aspiration pneumonia Underlying emphysema   -ct empiric ceftriaxone for aspiration >> can transition to oral antibiotics on discharge -DuoNebs  -Outpatient pulmonary follow-up and PFTs as outpatient -Smoking cessation was emphasized  Pulmonary nodules on CT  -outpatient follow-up in 6 to 12 months  Probable pulmonary hypertension -Obtain echo  AKI NAG metabolic acidosis -likely, resolved -dc bicarb gtt   PCCM will be available, hopefully she can be discharged before her birthday  Kara Mead MD. [facility]  If no response to pager , please call [phone]   11/22/2019

## 2019-11-22 NOTE — Progress Notes (Signed)
PROGRESS NOTE    Megan Odom  [id number] DOB: 1945-09-11 DOA: 11/21/2019 PCP: Rosita Fire, MD    Brief Narrative:  74 year old female with a history of hep C cirrhosis, presented [facility] for [facility]GD/colonoscopy.  After receiving propofol, she became acutely short of breath required intubation for airway management.  She was admitted to the hospital for further management.   Assessment & Plan:   Active Problems:   Essential hypertension   Hepatic cirrhosis (HCC)   Dysphagia   History of hepatitis C   GERD (gastroesophageal reflux disease)   Acute respiratory failure (HCC)   Acute respiratory failure with hypoxia ([place])   1. Acute respiratory failure with hypoxia.  Patient became short of breath after receiving propofol for planned endoscopy.  She was intubated in endoscopy suite.  She was admitted to the ICU for further management.  She was seen by pulmonology.  During wake-up assessment, patient began vomiting and likely aspirated.  She has been started on ceftriaxone.  Clinically, patient appears to be improving.  Can attempt extubation today 2. Hep C cirrhosis.  Will need screening for varices by EGD to be rescheduled as an outpatient 3. Acute kidney injury.  Patient did have transient hypotension yesterday after being started on propofol.  Continue to follow renal function.  Hold ARB 4. Hypertension.  Blood pressures currently stable 5. Pulmonary nodule on CT.  Will need outpatient follow-up in 6 to 12 months. 6. Underlying emphysema. continue on bronchodilators as needed.  No wheezing at this time.  Follow-up with pulmonology 7. Possible pulmonary hypertension.  Echocardiogram has been ordered.   DVT prophylaxis: lovenox Code Status: full code Family Communication: none present Disposition Plan: discharge home in next 24 hours if respiratory status remains stable after extubation   Consultants:   pulmonology  Procedures:   Intubation  4/1>  Antimicrobials:   Ceftriaxone 4/1>    Subjective: Patient intubated but awake. She is not agitated. Answers questions by nodding her head.   Objective: Vitals:   11/22/19 1432 11/22/19 1500 11/22/19 1600 11/22/19 1806  BP:    118/71  Pulse:  62  63  Resp:  20    Temp:   98.2 F (36.8 C) 98.3 F (36.8 C)  TempSrc:    Oral  SpO2: 98% 98%  100%  Weight:      Height:        Intake/Output Summary (Last 24 hours) at 11/22/2019 1845 Last data filed at 11/22/2019 1550 Gross per 24 hour  Intake 1645.62 ml  Output 1075 ml  Net 570.62 ml   Filed Weights   11/21/19 1028 11/22/19 0500  Weight: 113.1 kg 112.9 kg    Examination:  General exam: Appears calm and comfortable  Respiratory system: Clear to auscultation. Respiratory effort normal. Cardiovascular system: S1 & S2 heard, RRR. No JVD, murmurs, rubs, gallops or clicks. No pedal edema. Gastrointestinal system: Abdomen is nondistended, soft and nontender. No organomegaly or masses felt. Normal bowel sounds heard. Central nervous system: Alert and oriented. No focal neurological deficits. Extremities: Symmetric 5 x 5 power. Skin: No rashes, lesions or ulcers Psychiatry: Judgement and insight appear normal. Mood & affect appropriate.   Data Reviewed: I have personally reviewed following labs and imaging studies  CBC: Recent Labs  Lab 11/21/19 1202 11/22/19 0656  WBC 8.2 11.3*  HGB 13.2 12.4  HCT 43.2 41.0  MCV 93.3 94.3  PLT 288 [id number]   Basic Metabolic Panel: Recent Labs  Lab 11/21/19 1202 11/21/19 1354  11/22/19 0437  NA 132*  --  138  K 7.5* 4.0 3.8  CL 105  --  100  CO2 18*  --  25  GLUCOSE 167*  --  102*  BUN 11  --  14  CREATININE 1.14*  --  1.56*  CALCIUM 8.8*  --  8.8*  MG  --   --  1.5*  PHOS  --   --  5.4*   GFR: Estimated Creatinine Clearance: 41.5 mL/min (A) (by C-G formula based on SCr of 1.56 mg/dL (H)). Liver Function Tests: Recent Labs  Lab 11/21/19 1202  AST 52*  ALT 20  ALKPHOS  114  BILITOT 1.0  PROT 8.3*  ALBUMIN 3.6   No results for input(s): LIPASE, AMYLASE in the last 168 hours. No results for input(s): AMMONIA in the last 168 hours. Coagulation Profile: No results for input(s): INR, PROTIME in the last 168 hours. Cardiac Enzymes: No results for input(s): CKTOTAL, CKMB, CKMBINDEX, TROPONINI in the last 168 hours. BNP (last 3 results) No results for input(s): PROBNP in the last 8760 hours. HbA1C: No results for input(s): HGBA1C in the last 72 hours. CBG: Recent Labs  Lab 11/21/19 2012 11/22/19 0038 11/22/19 0358 11/22/19 0821 11/22/19 1154  GLUCAP 98 92 85 80 88   Lipid Profile: Recent Labs    11/22/19 0418  TRIG 92   Thyroid Function Tests: No results for input(s): TSH, T4TOTAL, FREET4, T3FREE, THYROIDAB in the last 72 hours. Anemia Panel: No results for input(s): VITAMINB12, FOLATE, FERRITIN, TIBC, IRON, RETICCTPCT in the last 72 hours. Sepsis Labs: No results for input(s): PROCALCITON, LATICACIDVEN in the last 168 hours.  Recent Results (from the past 240 hour(s))  SARS CORONAVIRUS 2 (TAT 6-24 HRS) Nasopharyngeal Nasopharyngeal Swab     Status: None   Collection Time: 11/19/19  7:20 AM   Specimen: Nasopharyngeal Swab  Result Value Ref Range Status   SARS Coronavirus 2 NEGATIVE NEGATIVE Final    Comment: (NOTE) SARS-CoV-2 target nucleic acids are NOT DETECTED. The SARS-CoV-2 RNA is generally detectable in upper and lower respiratory specimens during the acute phase of infection. Negative results do not preclude SARS-CoV-2 infection, do not rule out co-infections with other pathogens, and should not be used as the sole basis for treatment or other patient management decisions. Negative results must be combined with clinical observations, patient history, and epidemiological information. The expected result is Negative. Fact Sheet for Patients: SugarRoll.be Fact Sheet for Healthcare  Providers: https://www.woods-mathews.com/ This test is not yet approved or cleared by the [place] FDA and  has been authorized for detection and/or diagnosis of SARS-CoV-2 by FDA under an Emergency Use Authorization (EUA). This EUA will remain  in effect (meaning this test can be used) for the duration of the COVID-19 declaration under Section 56 4(b)(1) of the Act, 21 U.S.C. section 360bbb-3(b)(1), unless the authorization is terminated or revoked sooner. Performed at [facility], [place] [address], [place], [place] [address]   MRSA PCR Screening     Status: None   Collection Time: 11/21/19  1:08 PM   Specimen: Nasopharyngeal  Result Value Ref Range Status   MRSA by PCR NEGATIVE NEGATIVE Final    Comment:        The GeneXpert MRSA Assay (FDA approved for NASAL specimens only), is one component of a comprehensive MRSA colonization surveillance program. It is not intended to diagnose MRSA infection nor to guide or monitor treatment for MRSA infections. Performed at [facility], [place]  [address] [place],  [address]          Radiology Studies: [facility] Chest Port 1 View  Result Date: 11/22/2019 CLINICAL DATA:  Respiratory failure. EXAM: PORTABLE CHEST 1 VIEW COMPARISON:  11/21/2019. FINDINGS: Endotracheal tube and NG tube in stable position. Stable cardiomegaly. Improved pulmonary venous congestion. Bibasilar atelectasis/infiltrates again noted with interim progression. Small bilateral pleural effusions. No pneumothorax. IMPRESSION: 1.  Endotracheal tube and NG tube in stable position. 2.  Stable cardiomegaly.  Improved pulmonary venous congestion. 3. Progression of bibasilar atelectasis/infiltrates. Small bilateral pleural effusions noted on today's exam. Electronically Signed   By: Marcello Moores  Register   On: 11/22/2019 05:27   DG CHEST PORT 1 VIEW  Result Date: 11/21/2019 CLINICAL DATA:  Intubation. EXAM: PORTABLE CHEST 1 VIEW COMPARISON:  Chest x-ray  09/01/2018 FINDINGS: The endotracheal tube is 2.5 cm above the carina. The NG tube is coursing down the esophagus and into the stomach. The heart is borderline enlarged but appears stable. There is central vascular congestion without overt pulmonary edema. Patchy infiltrates versus atelectasis. No pleural effusions or pneumothorax. IMPRESSION: The endotracheal tube is 2.5 cm above the carina. The NG tube is in the stomach. Central vascular congestion without overt pulmonary edema. Patchy infiltrates or atelectasis. Electronically Signed   By: Marijo Sanes M.D.   On: 11/21/2019 10:46   ECHOCARDIOGRAM COMPLETE  Result Date: 11/22/2019    ECHOCARDIOGRAM REPORT   Patient Name:   DAPHNEY SANZARI Crisco Date of Exam: 11/22/2019 Medical Rec #:  [id number]          Height:       65.0 in Accession #:    [id number]         Weight:       248.9 lb Date of Birth:  February 08, 1946           BSA:          2.171 m Patient Age:    42 years           BP:           150/82 mmHg Patient Gender: F                  HR:           56 bpm. Exam Location:  [name] Procedure: 2D Echo, Cardiac Doppler and Color Doppler Indications:    Pulmonary hypertension 416.8 / I27.2  History:        Patient has prior history of Echocardiogram examinations, most                 recent 12/20/2012. CHF; Risk Factors:Hypertension and                 Dyslipidemia. Hep C w/o coma, chronic (HCC) (From Hx),Sleep                 apnea, obstructive.  Sonographer:    [facility] [place] R[facility] Referring Phys: [place]  1. Left ventricular ejection fraction, by estimation, is 60 to 65%. The left ventricle has normal function. The left ventricle has no regional wall motion abnormalities. There is mild left ventricular hypertrophy. Left ventricular diastolic parameters were normal.  2. Right ventricular systolic function is normal. The right ventricular size is normal. There is mildly elevated pulmonary artery systolic pressure.  3. Left atrial size was moderately  dilated.  4. The mitral valve is normal in structure. Mild mitral valve regurgitation. No evidence of mitral stenosis.  5. The aortic  valve is tricuspid. Aortic valve regurgitation is not visualized. Mild aortic valve sclerosis is present, with no evidence of aortic valve stenosis.  6. The inferior vena cava is normal in size with greater than 50% respiratory variability, suggesting right atrial pressure of 3 mmHg. FINDINGS  Left Ventricle: Left ventricular ejection fraction, by estimation, is 60 to 65%. The left ventricle has normal function. The left ventricle has no regional wall motion abnormalities. The left ventricular internal cavity size was small. There is mild left ventricular hypertrophy. Left ventricular diastolic parameters were normal. Right Ventricle: The right ventricular size is normal. No increase in right ventricular wall thickness. Right ventricular systolic function is normal. There is mildly elevated pulmonary artery systolic pressure. The tricuspid regurgitant velocity is 2.86  m/s, and with an assumed right atrial pressure of 3 mmHg, the estimated right ventricular systolic pressure is 123XX123 mmHg. Left Atrium: Left atrial size was moderately dilated. Right Atrium: Right atrial size was normal in size. Pericardium: There is no evidence of pericardial effusion. Mitral Valve: The mitral valve is normal in structure. There is mild thickening of the mitral valve leaflet(s). There is mild calcification of the mitral valve leaflet(s). Normal mobility of the mitral valve leaflets. Mild mitral annular calcification. Mild mitral valve regurgitation. No evidence of mitral valve stenosis. Tricuspid Valve: The tricuspid valve is normal in structure. Tricuspid valve regurgitation is mild . No evidence of tricuspid stenosis. Aortic Valve: The aortic valve is tricuspid. Aortic valve regurgitation is not visualized. Mild aortic valve sclerosis is present, with no evidence of aortic valve stenosis. Pulmonic  Valve: The pulmonic valve was normal in structure. Pulmonic valve regurgitation is not visualized. No evidence of pulmonic stenosis. Aorta: The aortic root is normal in size and structure. Venous: The inferior vena cava is normal in size with greater than 50% respiratory variability, suggesting right atrial pressure of 3 mmHg. IAS/Shunts: No atrial level shunt detected by color flow Doppler.  LEFT VENTRICLE PLAX 2D LVIDd:         4.49 cm  Diastology LVIDs:         2.74 cm  LV e' lateral:   6.42 cm/s LV PW:         1.06 cm  LV E/e' lateral: 14.0 LV IVS:        1.24 cm  LV e' medial:    4.35 cm/s LVOT diam:     1.80 cm  LV E/e' medial:  20.7 LV SV:         92 LV SV Index:   42 LVOT Area:     2.54 cm  RIGHT VENTRICLE RV Mid diam:    3.01 cm RV S prime:     16.90 cm/s TAPSE (M-mode): 2.9 cm LEFT ATRIUM             Index LA diam:        4.70 cm 2.17 cm/m LA Vol (A2C):   83.5 ml 38.46 ml/m LA Vol (A4C):   63.1 ml 29.07 ml/m LA Biplane Vol: 76.9 ml 35.42 ml/m  AORTIC VALVE LVOT Vmax:   196.00 cm/s LVOT Vmean:  131.000 cm/s LVOT VTI:    0.362 m  AORTA Ao Root diam: 3.20 cm MITRAL VALVE               TRICUSPID VALVE MV Area (PHT): 2.57 cm    TR Peak grad:   32.7 mmHg MV Decel Time: 295 msec    TR Vmax:  286.00 cm/s MV E velocity: 90.20 cm/s MV A velocity: 93.60 cm/s  SHUNTS MV E/A ratio:  0.96        Systemic VTI:  0.36 m                            Systemic Diam: 1.80 cm Jenkins Rouge MD Electronically signed by Jenkins Rouge MD Signature Date/Time: 11/22/2019/3:40:16 PM    Final         Scheduled Meds: . aspirin EC  81 mg Oral Daily  . budesonide (PULMICORT) nebulizer solution  0.5 mg Nebulization BID  . Chlorhexidine Gluconate Cloth  6 each Topical Daily  . DULoxetine  60 mg Oral Daily  . enoxaparin (LOVENOX) injection  55 mg Subcutaneous Q24H  . furosemide  40 mg Oral Daily  . ipratropium-albuterol  3 mL Nebulization Q6H  . mouth rinse  15 mL Mouth Rinse BID  . nicotine  21 mg Transdermal Daily  .  pantoprazole sodium  40 mg Per Tube Q1200  . verapamil  120 mg Oral Daily   Continuous Infusions: . cefTRIAXone (ROCEPHIN)  IV Stopped (11/22/19 1549)     LOS: 1 day    Time spent: 38mins    Kathie Dike, MD [facility]   If 7PM-7AM, please contact night-coverage [email or web address]  11/22/2019, 6:45 PM

## 2019-11-22 NOTE — Progress Notes (Signed)
Sedation steadily being titrated down. Propofol currently at 10 mcg/kg/min. Pt awake and following commands. However pt is agitated and continuously coughing and gaging over tube. Pt is currently weaning. Dr. Roderic Palau notified.

## 2019-11-22 NOTE — Care Management Important Message (Signed)
Important Message  Patient Details  Name: Megan Odom MRN: [id number] Date of Birth: [birth year]   Medicare Important Message Given:  Yes     Tommy Medal 11/22/2019, 2:44 PM

## 2019-11-22 NOTE — Progress Notes (Signed)
Verbal order by Dr. Roderic Palau to go ahead and extubate. Patient extubated at 0905 and placed on 4 L O2. Patient able to cough and follow commands. No complications noted. Reoriented patient to situation.

## 2019-11-23 LAB — BASIC METABOLIC PANEL
Anion gap: 12 (ref 5–15)
BUN: 15 mg/dL (ref 8–23)
CO2: 28 mmol/L (ref 22–32)
Calcium: 8.7 mg/dL — ABNORMAL LOW (ref 8.9–10.3)
Chloride: 99 mmol/L (ref 98–111)
Creatinine, Ser: 1.04 mg/dL — ABNORMAL HIGH (ref 0.44–1.00)
GFR calc Af Amer: >60 mL/min (ref >60)
GFR calc non Af Amer: 54 mL/min — ABNORMAL LOW (ref >60)
Glucose, Bld: 89 mg/dL (ref 70–99)
Potassium: 3.1 mmol/L — ABNORMAL LOW (ref 3.5–5.1)
Sodium: 139 mmol/L (ref 135–145)

## 2019-11-23 MED ORDER — CEPHALEXIN 500 MG PO CAPS: 500.0000 mg | ORAL_CAPSULE | Freq: Three times a day (TID) | ORAL | 0 refills | Status: AC

## 2019-11-23 MED ORDER — BENZONATATE 100 MG PO CAPS
200.0000 mg | ORAL_CAPSULE | Freq: Three times a day (TID) | ORAL | Status: DC | PRN
  Administered 2019-11-23: 200 mg via ORAL
  Filled 2019-11-23: qty 2

## 2019-11-23 MED ORDER — POTASSIUM CHLORIDE CRYS ER 20 MEQ PO TBCR
40.0000 meq | EXTENDED_RELEASE_TABLET | Freq: Once | ORAL | Status: AC
  Administered 2019-11-23: 40 meq via ORAL
  Filled 2019-11-23: qty 2

## 2019-11-23 NOTE — Discharge Summary (Signed)
Physician Discharge Summary  Megan Odom TKZ:[id number] DOB: [birth year] DOA: 11/21/2019  PCP: Rosita Fire, MD  Admit date: 11/21/2019 Discharge date: 11/23/2019  Admitted From: Home Disposition: Home  Recommendations for Outpatient Follow-up:  1. Follow up with PCP in 1-2 weeks 2. Please obtain BMP/CBC in one week 3. Follow-up with GI to reschedule endoscopy/colonoscopy  Discharge Condition: Stable CODE STATUS: Full code Diet recommendation: Heart healthy  Brief/Interim Summary: 74 year old female with history of hep C cirrhosis, who presented to [facility] for elective EGD/colonoscopy.  After receiving propofol for the procedure, she acutely became short of breath and required intubation for airway management.  She was admitted to the hospital for further management.  Discharge Diagnoses:  Active Problems:   Essential hypertension   Hepatic cirrhosis (HCC)   Dysphagia   History of hepatitis C   GERD (gastroesophageal reflux disease)   Acute respiratory failure (HCC)   Acute respiratory failure with hypoxia ([place])  1. Acute respiratory failure with hypoxia.  Patient became short of breath after receiving propofol for planned endoscopy.  She was intubated in endoscopy suite.  She was admitted to the ICU for further management.  She was seen by pulmonology.  During wake-up assessment, patient began vomiting and likely aspirated.    She was initially started on ceftriaxone was subsequently transitioned to Keflex. Clinically, the patient improved and was extubated on 4/2.  She has been breathing comfortably on room air since then. 2. Hep C cirrhosis.  Will need screening for varices by EGD to be rescheduled as an outpatient 3. Acute kidney injury.  Patient did have transient hypotension after being started on propofol.  ARB was held on admission.  Renal function improved with IV fluids. 4. Hypertension.  Blood pressures currently stable 5. Pulmonary nodule on CT.  Will need outpatient  follow-up in 6 to 12 months. 6. Underlying emphysema. continue on bronchodilators as needed.  No wheezing at this time.  Follow-up with pulmonology 7. Possible pulmonary hypertension.  Echocardiogram showed mild pulmonary hypertension  Discharge Instructions  Discharge Instructions    Diet - low sodium heart healthy   Complete by: As directed    Increase activity slowly   Complete by: As directed      Allergies as of 11/23/2019      Reactions   Darvocet [propoxyphene N-acetaminophen] Other (See Comments)   Hurts ears and side of face    Latex Swelling   Other Other (See Comments)   Hairspray(isoaplus)-causes big soars in head   Penicillins Other (See Comments)   Has patient had a PCN reaction causing immediate rash, facial/tongue/throat swelling, SOB or lightheadedness with hypotension: No Has patient had a PCN reaction causing severe rash involving mucus membranes or skin necrosis: No Has patient had a PCN reaction that required hospitalization No Has patient had a PCN reaction occurring within the last 10 years: No If all of the above answers are "NO", then may proceed with Cephalosporin use. Hurts ears and side of face    Tetracyclines & Related Other (See Comments)   Hurts ears and side of face   Adhesive [tape] Itching, Rash   Telemetry pads cause itching and rash at site      Medication List    STOP taking these medications   dexamethasone 4 MG tablet Commonly known as: DECADRON   diclofenac 75 MG EC tablet Commonly known as: VOLTAREN   linaclotide 290 MCG Caps capsule Commonly known as: Linzess     TAKE these medications   albuterol 108 (  90 Base) MCG/ACT inhaler Commonly known as: VENTOLIN HFA Inhale 2 puffs into the lungs every 4 (four) hours as needed for wheezing or shortness of breath.   aspirin EC 81 MG tablet Take 81 mg by mouth daily.   cephALEXin 500 MG capsule Commonly known as: KEFLEX Take 1 capsule (500 mg total) by mouth 3 (three) times daily  for 4 days.   cholecalciferol 25 MCG (1000 UNIT) tablet Commonly known as: VITAMIN D3 Take 1,000 Units by mouth daily.   clotrimazole-betamethasone cream Commonly known as: LOTRISONE Apply 1 application topically 2 (two) times daily.   DULoxetine 60 MG capsule Commonly known as: CYMBALTA Take 60 mg by mouth daily.   Fish Oil 1000 MG Caps Take 1,000 mg by mouth daily.   fluticasone 50 MCG/ACT nasal spray Commonly known as: FLONASE Place 2 sprays into the nose daily as needed for allergies.   furosemide 40 MG tablet Commonly known as: LASIX Take 40 mg by mouth daily.   gabapentin 300 MG capsule Commonly known as: NEURONTIN Take 300 mg by mouth 3 (three) times daily as needed (pain).   HYDROcodone-acetaminophen 7.5-325 MG tablet Commonly known as: NORCO Take 1-2 tablets by mouth in the morning and at bedtime.   ipratropium-albuterol 0.5-2.5 (3) MG/3ML Soln Commonly known as: DUONEB Take 3 mLs by nebulization every 6 (six) hours as needed (shortness of breath or wheezing).   ketoconazole 2 % cream Commonly known as: NIZORAL Apply 1 application topically daily as needed for irritation.   losartan 100 MG tablet Commonly known as: COZAAR Take 100 mg by mouth daily.   omeprazole 40 MG capsule Commonly known as: PRILOSEC Take 40 mg by mouth daily.   potassium chloride 10 MEQ tablet Commonly known as: KLOR-CON Take 40 mEq by mouth daily.   spironolactone 25 MG tablet Commonly known as: ALDACTONE Take 25 mg by mouth daily.   verapamil 120 MG CR tablet Commonly known as: CALAN-SR Take 120 mg by mouth daily.      Follow-up Information    Rourk, Cristopher Estimable, MD. Schedule an appointment as soon as possible for a visit in 2 week(s).   Specialty: Gastroenterology Contact information: [place] [address] [phone]          Allergies  Allergen Reactions  . Darvocet [Propoxyphene N-Acetaminophen] Other (See Comments)    Hurts ears and side  of face   . Latex Swelling  . Other Other (See Comments)    Hairspray(isoaplus)-causes big soars in head  . Penicillins Other (See Comments)    Has patient had a PCN reaction causing immediate rash, facial/tongue/throat swelling, SOB or lightheadedness with hypotension: No Has patient had a PCN reaction causing severe rash involving mucus membranes or skin necrosis: No Has patient had a PCN reaction that required hospitalization No Has patient had a PCN reaction occurring within the last 10 years: No If all of the above answers are "NO", then may proceed with Cephalosporin use. Hurts ears and side of face   . Tetracyclines & Related Other (See Comments)    Hurts ears and side of face  . Adhesive [Tape] Itching and Rash    Telemetry pads cause itching and rash at site    Consultations:  Pulmonology   Procedures/Studies: CT Biopsy  Result Date: 10-Nov-2019 CLINICAL DATA:  Elevated serum immunoglobulin free light chains EXAM: CT GUIDED DEEP ILIAC BONE ASPIRATION AND CORE BIOPSY TECHNIQUE: Patient was placed prone on the CT gantry and limited axial scans through the pelvis  were obtained. Appropriate skin entry site was identified. Skin site was marked, prepped with chlorhexidine, draped in usual sterile fashion, and infiltrated locally with 1% lidocaine. Intravenous Fentanyl 115mg and Versed 280mwere administered as conscious sedation during continuous monitoring of the patient's level of consciousness and physiological / cardiorespiratory status by the radiology RN, with a total moderate sedation time of 10 minutes. Under CT fluoroscopic guidance an 11-gauge Cook trocar bone needle was advanced into the right iliac bone just lateral to the sacroiliac joint. Once needle tip position was confirmed, core and aspiration samples were obtained, submitted to pathology for approval. Post procedure scans show no hematoma or fracture. Patient tolerated procedure well. COMPLICATIONS: COMPLICATIONS none  IMPRESSION: 1. Technically successful CT guided right iliac bone core and aspiration biopsy. Electronically Signed   By: D Lucrezia Europe.D.   On: 11/08/2019 16:24   DG Chest Port 1 View  Result Date: 11/22/2019 CLINICAL DATA:  Respiratory failure. EXAM: PORTABLE CHEST 1 VIEW COMPARISON:  11/21/2019. FINDINGS: Endotracheal tube and NG tube in stable position. Stable cardiomegaly. Improved pulmonary venous congestion. Bibasilar atelectasis/infiltrates again noted with interim progression. Small bilateral pleural effusions. No pneumothorax. IMPRESSION: 1.  Endotracheal tube and NG tube in stable position. 2.  Stable cardiomegaly.  Improved pulmonary venous congestion. 3. Progression of bibasilar atelectasis/infiltrates. Small bilateral pleural effusions noted on today's exam. Electronically Signed   By: ThMarcello MooresRegister   On: 11/22/2019 05:27   DG CHEST PORT 1 VIEW  Result Date: 11/21/2019 CLINICAL DATA:  Intubation. EXAM: PORTABLE CHEST 1 VIEW COMPARISON:  Chest x-ray 09/01/2018 FINDINGS: The endotracheal tube is 2.5 cm above the carina. The NG tube is coursing down the esophagus and into the stomach. The heart is borderline enlarged but appears stable. There is central vascular congestion without overt pulmonary edema. Patchy infiltrates versus atelectasis. No pleural effusions or pneumothorax. IMPRESSION: The endotracheal tube is 2.5 cm above the carina. The NG tube is in the stomach. Central vascular congestion without overt pulmonary edema. Patchy infiltrates or atelectasis. Electronically Signed   By: P.Marijo Sanes.D.   On: 11/21/2019 10:46   CT BONE MARROW BIOPSY & ASPIRATION  Result Date: 11/08/2019 CLINICAL DATA:  Elevated serum immunoglobulin free light chains EXAM: CT GUIDED DEEP ILIAC BONE ASPIRATION AND CORE BIOPSY TECHNIQUE: Patient was placed prone on the CT gantry and limited axial scans through the pelvis were obtained. Appropriate skin entry site was identified. Skin site was marked, prepped  with chlorhexidine, draped in usual sterile fashion, and infiltrated locally with 1% lidocaine. Intravenous Fentanyl 10039mand Versed 2mg41mre administered as conscious sedation during continuous monitoring of the patient's level of consciousness and physiological / cardiorespiratory status by the radiology RN, with a total moderate sedation time of 10 minutes. Under CT fluoroscopic guidance an 11-gauge Cook trocar bone needle was advanced into the right iliac bone just lateral to the sacroiliac joint. Once needle tip position was confirmed, core and aspiration samples were obtained, submitted to pathology for approval. Post procedure scans show no hematoma or fracture. Patient tolerated procedure well. COMPLICATIONS: COMPLICATIONS none IMPRESSION: 1. Technically successful CT guided right iliac bone core and aspiration biopsy. Electronically Signed   By: D  HLucrezia Europe.   On: 11/08/2019 16:24   ECHOCARDIOGRAM COMPLETE  Result Date: 11/22/2019    ECHOCARDIOGRAM REPORT   Patient Name:   EARLLEKESHIA KRAMTERFIELD Date of Exam: 11/22/2019 Medical Rec #:  [id number]      Height:  65.0 in Accession #:    [id number]         Weight:       248.9 lb Date of Birth:  1946/03/31           BSA:          2.171 m Patient Age:    56 years           BP:           150/82 mmHg Patient Gender: F                  HR:           56 bpm. Exam Location:  [name] Procedure: 2D Echo, Cardiac Doppler and Color Doppler Indications:    Pulmonary hypertension 416.8 / I27.2  History:        Patient has prior history of Echocardiogram examinations, most                 recent 12/20/2012. CHF; Risk Factors:Hypertension and                 Dyslipidemia. Hep C w/o coma, chronic (HCC) (From Hx),Sleep                 apnea, obstructive.  Sonographer:    Alvino Chapel RCS Referring Phys: [place]  1. Left ventricular ejection fraction, by estimation, is 60 to 65%. The left ventricle has normal function. The left ventricle has no  regional wall motion abnormalities. There is mild left ventricular hypertrophy. Left ventricular diastolic parameters were normal.  2. Right ventricular systolic function is normal. The right ventricular size is normal. There is mildly elevated pulmonary artery systolic pressure.  3. Left atrial size was moderately dilated.  4. The mitral valve is normal in structure. Mild mitral valve regurgitation. No evidence of mitral stenosis.  5. The aortic valve is tricuspid. Aortic valve regurgitation is not visualized. Mild aortic valve sclerosis is present, with no evidence of aortic valve stenosis.  6. The inferior vena cava is normal in size with greater than 50% respiratory variability, suggesting right atrial pressure of 3 mmHg. FINDINGS  Left Ventricle: Left ventricular ejection fraction, by estimation, is 60 to 65%. The left ventricle has normal function. The left ventricle has no regional wall motion abnormalities. The left ventricular internal cavity size was small. There is mild left ventricular hypertrophy. Left ventricular diastolic parameters were normal. Right Ventricle: The right ventricular size is normal. No increase in right ventricular wall thickness. Right ventricular systolic function is normal. There is mildly elevated pulmonary artery systolic pressure. The tricuspid regurgitant velocity is 2.86  m/s, and with an assumed right atrial pressure of 3 mmHg, the estimated right ventricular systolic pressure is 52.8 mmHg. Left Atrium: Left atrial size was moderately dilated. Right Atrium: Right atrial size was normal in size. Pericardium: There is no evidence of pericardial effusion. Mitral Valve: The mitral valve is normal in structure. There is mild thickening of the mitral valve leaflet(s). There is mild calcification of the mitral valve leaflet(s). Normal mobility of the mitral valve leaflets. Mild mitral annular calcification. Mild mitral valve regurgitation. No evidence of mitral valve stenosis.  Tricuspid Valve: The tricuspid valve is normal in structure. Tricuspid valve regurgitation is mild . No evidence of tricuspid stenosis. Aortic Valve: The aortic valve is tricuspid. Aortic valve regurgitation is not visualized. Mild aortic valve sclerosis is present, with no evidence of aortic valve stenosis. Pulmonic Valve: The pulmonic  valve was normal in structure. Pulmonic valve regurgitation is not visualized. No evidence of pulmonic stenosis. Aorta: The aortic root is normal in size and structure. Venous: The inferior vena cava is normal in size with greater than 50% respiratory variability, suggesting right atrial pressure of 3 mmHg. IAS/Shunts: No atrial level shunt detected by color flow Doppler.  LEFT VENTRICLE PLAX 2D LVIDd:         4.49 cm  Diastology LVIDs:         2.74 cm  LV e' lateral:   6.42 cm/s LV PW:         1.06 cm  LV E/e' lateral: 14.0 LV IVS:        1.24 cm  LV e' medial:    4.35 cm/s LVOT diam:     1.80 cm  LV E/e' medial:  20.7 LV SV:         92 LV SV Index:   42 LVOT Area:     2.54 cm  RIGHT VENTRICLE RV Mid diam:    3.01 cm RV S prime:     16.90 cm/s TAPSE (M-mode): 2.9 cm LEFT ATRIUM             Index LA diam:        4.70 cm 2.17 cm/m LA Vol (A2C):   83.5 ml 38.46 ml/m LA Vol (A4C):   63.1 ml 29.07 ml/m LA Biplane Vol: 76.9 ml 35.42 ml/m  AORTIC VALVE LVOT Vmax:   196.00 cm/s LVOT Vmean:  131.000 cm/s LVOT VTI:    0.362 m  AORTA Ao Root diam: 3.20 cm MITRAL VALVE               TRICUSPID VALVE MV Area (PHT): 2.57 cm    TR Peak grad:   32.7 mmHg MV Decel Time: 295 msec    TR Vmax:        286.00 cm/s MV E velocity: 90.20 cm/s MV A velocity: 93.60 cm/s  SHUNTS MV E/A ratio:  0.96        Systemic VTI:  0.36 m                            Systemic Diam: 1.80 cm Jenkins Rouge MD Electronically signed by Jenkins Rouge MD Signature Date/Time: 11/22/2019/3:40:16 PM    Final    CT CHEST LUNG CA SCREEN LOW DOSE W/O CM  Result Date: 10/28/2019 CLINICAL DATA:  Current smoker, 32 pack-year history.  EXAM: CT CHEST WITHOUT CONTRAST LOW-DOSE FOR LUNG CANCER SCREENING TECHNIQUE: Multidetector CT imaging of the chest was performed following the standard protocol without IV contrast. COMPARISON:  09/02/2015. FINDINGS: Cardiovascular: Atherosclerotic calcification of the aorta and coronary arteries. Pulmonic trunk is enlarged. Heart is at the upper limits of normal in size. No pericardial effusion. Mediastinum/Nodes: Mediastinal and axillary lymph nodes are not enlarged by CT size criteria. Hilar regions are difficult to evaluate without IV contrast. Esophagus is unremarkable. Lungs/Pleura: Centrilobular and paraseptal emphysema. Pulmonary nodules measure 11.4 mm or less in size. No pleural fluid. Airway is unremarkable. Upper Abdomen: Visualized portions of the liver and adrenal glands are unremarkable. There may be tiny stones in right kidney. Visualized portions of the left kidney, spleen, pancreas, stomach and bowel are grossly unremarkable. Cholecystectomy. No upper abdominal adenopathy. Musculoskeletal: No worrisome lytic or sclerotic lesions. IMPRESSION: 1. Lung-RADS 2, benign appearance or behavior. Continue annual screening with low-dose chest CT without contrast in 12 months. 2. Possible right  renal stones. 3. Aortic atherosclerosis (ICD10-I70.0). Coronary artery calcification. 4. Enlarged pulmonic trunk, indicative of pulmonary arterial hypertension. 5.  Emphysema (ICD10-J43.9). Electronically Signed   By: Lorin Picket M.D.   On: 10/28/2019 08:10       Subjective: Complains of sore throat with some abdominal discomfort, but able to eat breakfast and lunch.  No vomiting.  Discharge Exam: Vitals:   11/22/19 2347 11/23/19 0421 11/23/19 0707 11/23/19 0712  BP: (!) 145/50 (!) 166/87    Pulse: 60 66    Resp: 18 18    Temp: 97.8 F (36.6 C) 98.2 F (36.8 C)    TempSrc: Oral Oral    SpO2: 97% 92% 93% 93%  Weight:      Height:        General: Pt is alert, awake, not in acute  distress Cardiovascular: RRR, S1/S2 +, no rubs, no gallops Respiratory: CTA bilaterally, no wheezing, no rhonchi Abdominal: Soft, NT, ND, bowel sounds + Extremities: no edema, no cyanosis    The results of significant diagnostics from this hospitalization (including imaging, microbiology, ancillary and laboratory) are listed below for reference.     Microbiology: Recent Results (from the past 240 hour(s))  SARS CORONAVIRUS 2 (TAT 6-24 HRS) Nasopharyngeal Nasopharyngeal Swab     Status: None   Collection Time: 11/19/19  7:20 AM   Specimen: Nasopharyngeal Swab  Result Value Ref Range Status   SARS Coronavirus 2 NEGATIVE NEGATIVE Final    Comment: (NOTE) SARS-CoV-2 target nucleic acids are NOT DETECTED. The SARS-CoV-2 RNA is generally detectable in upper and lower respiratory specimens during the acute phase of infection. Negative results do not preclude SARS-CoV-2 infection, do not rule out co-infections with other pathogens, and should not be used as the sole basis for treatment or other patient management decisions. Negative results must be combined with clinical observations, patient history, and epidemiological information. The expected result is Negative. Fact Sheet for Patients: SugarRoll.be Fact Sheet for Healthcare Providers: https://www.woods-mathews.com/ This test is not yet approved or cleared by the [place] FDA and  has been authorized for detection and/or diagnosis of SARS-CoV-2 by FDA under an Emergency Use Authorization (EUA). This EUA will remain  in effect (meaning this test can be used) for the duration of the COVID-19 declaration under Section 56 4(b)(1) of the Act, 21 U.S.C. section 360bbb-3(b)(1), unless the authorization is terminated or revoked sooner. Performed at [facility], [place] [address] [place], [place] [address]   MRSA PCR Screening     Status: None   Collection Time: 11/21/19  1:08 PM    Specimen: Nasopharyngeal  Result Value Ref Range Status   MRSA by PCR NEGATIVE NEGATIVE Final    Comment:        The GeneXpert MRSA Assay (FDA approved for NASAL specimens only), is one component of a comprehensive MRSA colonization surveillance program. It is not intended to diagnose MRSA infection nor to guide or monitor treatment for MRSA infections. Performed at [facility], [address] [place], [place] [address]      Labs: BNP (last 3 results) No results for input(s): BNP in the last 8760 hours. Basic Metabolic Panel: Recent Labs  Lab 11/21/19 1202 11/21/19 1354 11/22/19 0437 11/23/19 0606  NA 132*  --  138 139  K 7.5* 4.0 3.8 3.1*  CL 105  --  100 99  CO2 18*  --  25 28  GLUCOSE 167*  --  102* 89  BUN 11  --  14 15  CREATININE 1.14*  --  1.56* 1.04*  CALCIUM 8.8*  --  8.8* 8.7*  MG  --   --  1.5*  --   PHOS  --   --  5.4*  --    Liver Function Tests: Recent Labs  Lab 11/21/19 1202  AST 52*  ALT 20  ALKPHOS 114  BILITOT 1.0  PROT 8.3*  ALBUMIN 3.6   No results for input(s): LIPASE, AMYLASE in the last 168 hours. No results for input(s): AMMONIA in the last 168 hours. CBC: Recent Labs  Lab 11/21/19 1202 11/22/19 0656  WBC 8.2 11.3*  HGB 13.2 12.4  HCT 43.2 41.0  MCV 93.3 94.3  PLT 288 242   Cardiac Enzymes: No results for input(s): CKTOTAL, CKMB, CKMBINDEX, TROPONINI in the last 168 hours. BNP: Invalid input(s): POCBNP CBG: Recent Labs  Lab 11/21/19 2012 11/22/19 0038 11/22/19 0358 11/22/19 0821 11/22/19 1154  GLUCAP 98 92 85 80 88   D-Dimer No results for input(s): DDIMER in the last 72 hours. Hgb A1c No results for input(s): HGBA1C in the last 72 hours. Lipid Profile Recent Labs    11/22/19 0418  TRIG 92   Thyroid function studies No results for input(s): TSH, T4TOTAL, T3FREE, THYROIDAB in the last 72 hours.  Invalid input(s): FREET3 Anemia work up No results for input(s): VITAMINB12, FOLATE, FERRITIN, TIBC, IRON,  RETICCTPCT in the last 72 hours. Urinalysis    Component Value Date/Time   COLORURINE YELLOW 05/11/2018 1700   APPEARANCEUR CLEAR 05/11/2018 1700   LABSPEC 1.010 05/11/2018 1700   PHURINE 6.0 05/11/2018 1700   GLUCOSEU NEGATIVE 05/11/2018 1700   HGBUR NEGATIVE 05/11/2018 1700   BILIRUBINUR NEGATIVE 05/11/2018 1700   KETONESUR NEGATIVE 05/11/2018 1700   PROTEINUR NEGATIVE 05/11/2018 1700   UROBILINOGEN 2.0 (H) 12/09/2014 1200   NITRITE NEGATIVE 05/11/2018 1700   LEUKOCYTESUR NEGATIVE 05/11/2018 1700   Sepsis Labs Invalid input(s): PROCALCITONIN,  WBC,  LACTICIDVEN Microbiology Recent Results (from the past 240 hour(s))  SARS CORONAVIRUS 2 (TAT 6-24 HRS) Nasopharyngeal Nasopharyngeal Swab     Status: None   Collection Time: 11/19/19  7:20 AM   Specimen: Nasopharyngeal Swab  Result Value Ref Range Status   SARS Coronavirus 2 NEGATIVE NEGATIVE Final    Comment: (NOTE) SARS-CoV-2 target nucleic acids are NOT DETECTED. The SARS-CoV-2 RNA is generally detectable in upper and lower respiratory specimens during the acute phase of infection. Negative results do not preclude SARS-CoV-2 infection, do not rule out co-infections with other pathogens, and should not be used as the sole basis for treatment or other patient management decisions. Negative results must be combined with clinical observations, patient history, and epidemiological information. The expected result is Negative. Fact Sheet for Patients: SugarRoll.be Fact Sheet for Healthcare Providers: https://www.woods-mathews.com/ This test is not yet approved or cleared by the [place] FDA and  has been authorized for detection and/or diagnosis of SARS-CoV-2 by FDA under an Emergency Use Authorization (EUA). This EUA will remain  in effect (meaning this test can be used) for the duration of the COVID-19 declaration under Section 56 4(b)(1) of the Act, 21 U.S.C. section  360bbb-3(b)(1), unless the authorization is terminated or revoked sooner. Performed at [facility], [place] [address], [place], [place] [address]   MRSA PCR Screening     Status: None   Collection Time: 11/21/19  1:08 PM   Specimen: Nasopharyngeal  Result Value Ref Range Status   MRSA by PCR NEGATIVE NEGATIVE Final    Comment:  The GeneXpert MRSA Assay (FDA approved for NASAL specimens only), is one component of a comprehensive MRSA colonization surveillance program. It is not intended to diagnose MRSA infection nor to guide or monitor treatment for MRSA infections. Performed at [facility], [address], [place], [place] [address]      Time coordinating discharge: 39mns  SIGNED:   JKathie Dike MD  [facility] 11/23/2019, 6:21 PM   If 7PM-7AM, please contact night-coverage [email or web address]

## 2019-11-25 ENCOUNTER — Other Ambulatory Visit: Payer: Self-pay

## 2019-11-25 NOTE — Patient Outreach (Signed)
[facility]) Care Management  11/25/2019  Megan Odom 04-Oct-1945 RN:[id number]     Transition of Care Referral  Referral Date: 11/25/2019 Referral Source: [facility] Discharge Report Date of Discharge: 11/23/2019 Facility: [place]: [facility]    Referral received. Transition of care calls being completed via EMMI-automated calls. RN CM will outreach patient for any red flags received.    Plan: RN CM will close case at this time.    Enzo Montgomery, RN,BSN,CCM [place] Management Telephonic Care Management Coordinator Direct Phone: [phone] Toll Free: [phone] Fax: [phone]

## 2019-11-27 DIAGNOSIS — R0689 Other abnormalities of breathing: Secondary | ICD-10-CM | POA: Diagnosis not present

## 2019-11-27 DIAGNOSIS — T17920A Food in respiratory tract, part unspecified causing asphyxiation, initial encounter: Secondary | ICD-10-CM | POA: Diagnosis not present

## 2019-11-27 DIAGNOSIS — R0602 Shortness of breath: Secondary | ICD-10-CM | POA: Diagnosis not present

## 2019-11-27 DIAGNOSIS — R0902 Hypoxemia: Secondary | ICD-10-CM | POA: Diagnosis not present

## 2019-11-29 ENCOUNTER — Telehealth: Payer: Self-pay | Admitting: *Deleted

## 2019-11-29 ENCOUNTER — Other Ambulatory Visit: Payer: Self-pay

## 2019-11-29 NOTE — Telephone Encounter (Signed)
Pt called in and was upset and scared.  She had questions about her recent procedure experience at the hospital.  Explained to pt that her procedure was not completed due to her being in respiratory distress resulting in her being transported to ICU for recovery.  Pt voiced understanding.  She said she did not wish to reschedule EGD due to being scared of this happening again.  She said she would consider rescheduling TCS.  She said that she is experiencing terrible itching and wants to know if this could be related to her liver.  Would like [place] to prescribe something for her if it is liver related.

## 2019-11-29 NOTE — Telephone Encounter (Signed)
I was not involved in her recent care. I last saw her in 04/2019. I would not suspect her itching to be due to her liver based on labs several days ago.  I would be concerned she has itching related to medication.  She should go to ER if any rash, swelling, lip/throat swelling.  Otherwise touch base with PCP. Can use OTC benadryl 1/2 tablet (12.5mg ) every six hours as needed for significant itching. CAUTION IT CAN CAUSE SEDATION.  Offer her follow up ov here to reassess gi issues.

## 2019-11-29 NOTE — Patient Outreach (Signed)
[facility]) Care Management  11/29/2019  Megan Odom [facility] [birth year] [id number]    EMMI-General Discharge RED ON EMMI ALERT Day # 4 Date: 11/28/2019 Red Alert Reason: "know who to call about changes in conditions? No Scheduled follow-up? No  Lost interest in things? Yes Sad/hopeless/anxious/empty? Yes Other questions/problems? Yes"     Outreach attempt # 1 to patient.  No answer. RN CM left HIPAA compliant voicemail message along with contact info.       Plan: RN CM will make outreach attempt to patient within 3-4 business days. RN CM will send unsuccessful outreach letter to patient.  Enzo Montgomery, RN,BSN,CCM [place] Management Telephonic Care Management Coordinator Direct Phone: [phone] Toll Free: [phone] Fax: [phone]

## 2019-11-29 NOTE — Telephone Encounter (Signed)
Called pt and informed her that LSL does not suspect her itching to be related to her liver based on labs several days ago.  She was informed that it could be related to medication.  She was advised to go to ER if any rash, swelling, lip/throat swelling.  She was encouraged to touch base with PCP.  She was advised that she could use OTC benadryl 1/2 tablet every six hours as needed for itching.  She was informed that it can cause sedation.  Offered to schedule her for a fu to reassess gi issues but she declined and said she would call us on Monday because she was at the grocery store right now.  Pt voiced understanding to all information communicated.

## 2019-12-02 ENCOUNTER — Other Ambulatory Visit: Payer: Self-pay

## 2019-12-02 NOTE — Patient Outreach (Signed)
[facility]) Care Management  12/02/2019  Megan Odom [facility] 10-11-1945 RN:[id number]   EMMI-General Discharge RED ON EMMI ALERT Day # 4 Date: 11/28/2019 Red Alert Reason: "know who to call about changes in conditions? No Scheduled follow-up? No  Lost interest in things? Yes Sad/hopeless/anxious/empty? Yes Other questions/problems? Yes"     Outreach attempt #2 to patient. Spoke with patient. She denies any acute issues or concerns at present. She reports that she is getting better daily. RN CM reviewed and addressed red alerts with patient. She denies responding to questions that way. Patient confirms she has MDs contact MD info and know when and how to contact them. She has not yet made follow up appt but is aware that she needs to do so and encouraged to call office today. She voices that she will follow up. She denies any loss of interest and feeling down-states that she just has not been physically abel to do as much as normal until she regains strength. She denies any other questions/problems or concerns at this time. She has completed post discharge automated calls.      Plan: RN CM will close case at this time.    Enzo Montgomery, RN,BSN,CCM [place] Management Telephonic Care Management Coordinator Direct Phone: [phone] Toll Free: [phone] Fax: [phone]

## 2019-12-06 DIAGNOSIS — Z20828 Contact with and (suspected) exposure to other viral communicable diseases: Secondary | ICD-10-CM | POA: Diagnosis not present

## 2019-12-10 DIAGNOSIS — Z20828 Contact with and (suspected) exposure to other viral communicable diseases: Secondary | ICD-10-CM | POA: Diagnosis not present

## 2019-12-12 DIAGNOSIS — I10 Essential (primary) hypertension: Secondary | ICD-10-CM | POA: Diagnosis not present

## 2019-12-12 DIAGNOSIS — J449 Chronic obstructive pulmonary disease, unspecified: Secondary | ICD-10-CM | POA: Diagnosis not present

## 2019-12-15 DIAGNOSIS — Z20828 Contact with and (suspected) exposure to other viral communicable diseases: Secondary | ICD-10-CM | POA: Diagnosis not present

## 2019-12-20 DIAGNOSIS — M5416 Radiculopathy, lumbar region: Secondary | ICD-10-CM | POA: Diagnosis not present

## 2019-12-20 DIAGNOSIS — M25519 Pain in unspecified shoulder: Secondary | ICD-10-CM | POA: Diagnosis not present

## 2019-12-20 DIAGNOSIS — M545 Low back pain: Secondary | ICD-10-CM | POA: Diagnosis not present

## 2019-12-20 DIAGNOSIS — M542 Cervicalgia: Secondary | ICD-10-CM | POA: Diagnosis not present

## 2020-01-02 ENCOUNTER — Ambulatory Visit (HOSPITAL_COMMUNITY): Payer: Medicare HMO | Admitting: Hematology

## 2020-01-10 DIAGNOSIS — K7460 Unspecified cirrhosis of liver: Secondary | ICD-10-CM | POA: Diagnosis not present

## 2020-01-10 DIAGNOSIS — I10 Essential (primary) hypertension: Secondary | ICD-10-CM | POA: Diagnosis not present

## 2020-01-10 DIAGNOSIS — J449 Chronic obstructive pulmonary disease, unspecified: Secondary | ICD-10-CM | POA: Diagnosis not present

## 2020-01-23 ENCOUNTER — Ambulatory Visit (HOSPITAL_COMMUNITY): Payer: Medicare HMO | Admitting: Hematology

## 2020-01-24 ENCOUNTER — Inpatient Hospital Stay (HOSPITAL_COMMUNITY): Payer: Medicare HMO | Attending: Hematology | Admitting: Hematology

## 2020-01-24 ENCOUNTER — Other Ambulatory Visit: Payer: Self-pay

## 2020-01-24 VITALS — BP 146/78 | HR 68 | Temp 96.8°F | Resp 18 | Wt 252.0 lb

## 2020-01-24 DIAGNOSIS — Z79891 Long term (current) use of opiate analgesic: Secondary | ICD-10-CM | POA: Diagnosis not present

## 2020-01-24 DIAGNOSIS — R768 Other specified abnormal immunological findings in serum: Secondary | ICD-10-CM | POA: Insufficient documentation

## 2020-01-24 DIAGNOSIS — M542 Cervicalgia: Secondary | ICD-10-CM | POA: Diagnosis not present

## 2020-01-24 DIAGNOSIS — M25519 Pain in unspecified shoulder: Secondary | ICD-10-CM | POA: Diagnosis not present

## 2020-01-24 DIAGNOSIS — M25579 Pain in unspecified ankle and joints of unspecified foot: Secondary | ICD-10-CM | POA: Diagnosis not present

## 2020-01-24 DIAGNOSIS — F17210 Nicotine dependence, cigarettes, uncomplicated: Secondary | ICD-10-CM | POA: Diagnosis not present

## 2020-01-24 DIAGNOSIS — M545 Low back pain: Secondary | ICD-10-CM | POA: Diagnosis not present

## 2020-01-24 DIAGNOSIS — E876 Hypokalemia: Secondary | ICD-10-CM | POA: Insufficient documentation

## 2020-01-24 DIAGNOSIS — M5416 Radiculopathy, lumbar region: Secondary | ICD-10-CM | POA: Diagnosis not present

## 2020-01-24 NOTE — Patient Instructions (Signed)
[place] at [facility] Discharge Instructions  You were seen today by Dr. Delton Coombes. He went over your recent results. The results of your biopsy came back normal. Dr. Delton Coombes will see you back in 4 months for labs and follow up.   Thank you for choosing [place] at [facility] to provide your oncology and hematology care.  To afford each patient quality time with our provider, please arrive at least 15 minutes before your scheduled appointment time.   If you have a lab appointment with the [facility] please come in thru the  Main Entrance and check in at the main information desk  You need to re-schedule your appointment should you arrive 10 or more minutes late.  We strive to give you quality time with our providers, and arriving late affects you and other patients whose appointments are after yours.  Also, if you no show three or more times for appointments you may be dismissed from the clinic at the providers discretion.     Again, thank you for choosing [facility].  Our hope is that these requests will decrease the amount of time that you wait before being seen by our physicians.       _____________________________________________________________  Should you have questions after your visit to [facility], please contact our office at [phone] between the hours of 8:00 a.m. and 4:30 p.m.  Voicemails left after 4:00 p.m. will not be returned until the following business day.  For prescription refill requests, have your pharmacy contact our office and allow 72 hours.    Cancer Center Support Programs:   > Cancer Support Group  2nd Tuesday of the month 1pm-2pm, Journey Room
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
## 2020-02-10 DIAGNOSIS — I10 Essential (primary) hypertension: Secondary | ICD-10-CM | POA: Diagnosis not present

## 2020-02-10 DIAGNOSIS — J449 Chronic obstructive pulmonary disease, unspecified: Secondary | ICD-10-CM | POA: Diagnosis not present

## 2020-02-11 DIAGNOSIS — Z20828 Contact with and (suspected) exposure to other viral communicable diseases: Secondary | ICD-10-CM | POA: Diagnosis not present

## 2020-02-14 ENCOUNTER — Ambulatory Visit: Payer: Medicare HMO | Admitting: Orthopedic Surgery

## 2020-02-14 DIAGNOSIS — M5416 Radiculopathy, lumbar region: Secondary | ICD-10-CM | POA: Diagnosis not present

## 2020-02-14 DIAGNOSIS — M545 Low back pain: Secondary | ICD-10-CM | POA: Diagnosis not present

## 2020-03-06 ENCOUNTER — Ambulatory Visit: Payer: Medicare HMO | Admitting: Orthopedic Surgery

## 2020-03-11 DIAGNOSIS — F331 Major depressive disorder, recurrent, moderate: Secondary | ICD-10-CM | POA: Diagnosis not present

## 2020-03-11 DIAGNOSIS — I10 Essential (primary) hypertension: Secondary | ICD-10-CM | POA: Diagnosis not present

## 2020-03-17 DIAGNOSIS — Z20828 Contact with and (suspected) exposure to other viral communicable diseases: Secondary | ICD-10-CM | POA: Diagnosis not present

## 2020-03-27 ENCOUNTER — Other Ambulatory Visit (HOSPITAL_COMMUNITY): Payer: Medicare HMO

## 2020-04-03 ENCOUNTER — Ambulatory Visit (HOSPITAL_COMMUNITY): Payer: Medicare HMO | Admitting: Hematology

## 2020-04-08 ENCOUNTER — Telehealth: Payer: Self-pay | Admitting: Internal Medicine

## 2020-04-08 NOTE — Telephone Encounter (Signed)
[phone]  PLEASE CALL PATIENT, SAID EVER SINCE HER PROCEDURE "SOMETHING ISNT RIGHT"

## 2020-04-09 ENCOUNTER — Encounter: Payer: Self-pay | Admitting: Internal Medicine

## 2020-04-09 MED ORDER — ONDANSETRON HCL 4 MG PO TABS: 4.0000 mg | ORAL_TABLET | Freq: Three times a day (TID) | ORAL | 1 refills | Status: DC | PRN

## 2020-04-09 NOTE — Telephone Encounter (Signed)
Patient was scheduled for EGD/TCS in 11/2019. Patient was placed under MAC in preparation for procedures but developed laryngeal spasm and required intubation. EGD/TCS cancelled.   She was last seen in 11/2019. She needs ov to address her GI concerns. Agree with ov with RMR.   I can send in nausea medication, sent to [facility]. Continue PPI. She can also be evaluated by her PCP in the interim if needed.

## 2020-04-09 NOTE — Telephone Encounter (Signed)
Pt called and said she has been sick every since our office attempted to complete pts procedure. Pt was able to have the EGD/TCS due to pt having problems during the procedure. Pt states she has n/v all the time. When pt wakes up in the morning, she is severely nauseated. Pt was asked if she is taking nausea medicine and she doesn't have nausea medication to take. Pt takes Omeprazole daily and says her bowels are moving daily. Pt didn't schedule an appointment as asked in April 2021 when pt called with c/o of the procedure. Pt says she was scared to come into the office due to what happened during the procedure. Pt was advised to schedule a follow up with Dr. Gala Romney.   Please schedule pt on a Friday with Dr. Gala Romney when the schedule is open.

## 2020-04-09 NOTE — Addendum Note (Signed)
Addended by: Mahala Menghini on: 04/09/2020 12:58 PM   Modules accepted: Orders

## 2020-04-09 NOTE — Telephone Encounter (Signed)
Lmom, ,waiting on a return call.

## 2020-04-11 DIAGNOSIS — F331 Major depressive disorder, recurrent, moderate: Secondary | ICD-10-CM | POA: Diagnosis not present

## 2020-04-11 DIAGNOSIS — I10 Essential (primary) hypertension: Secondary | ICD-10-CM | POA: Diagnosis not present

## 2020-04-15 NOTE — Telephone Encounter (Signed)
Spoke with pt. Pt was notified that nausea medication was sent to pts pharmacy and pt has been scheduled an appointment for 05/29/20 with Dr. Gala Romney. Pt is aware that she can discuss her concerns at that apt.

## 2020-04-17 ENCOUNTER — Ambulatory Visit: Payer: Medicare HMO | Admitting: Orthopedic Surgery

## 2020-04-17 DIAGNOSIS — M545 Low back pain: Secondary | ICD-10-CM | POA: Diagnosis not present

## 2020-04-17 DIAGNOSIS — Z79891 Long term (current) use of opiate analgesic: Secondary | ICD-10-CM | POA: Diagnosis not present

## 2020-04-17 DIAGNOSIS — M549 Dorsalgia, unspecified: Secondary | ICD-10-CM | POA: Diagnosis not present

## 2020-04-17 DIAGNOSIS — M542 Cervicalgia: Secondary | ICD-10-CM | POA: Diagnosis not present

## 2020-04-17 DIAGNOSIS — M5416 Radiculopathy, lumbar region: Secondary | ICD-10-CM | POA: Diagnosis not present

## 2020-04-17 DIAGNOSIS — M25519 Pain in unspecified shoulder: Secondary | ICD-10-CM | POA: Diagnosis not present

## 2020-04-23 DIAGNOSIS — Z20828 Contact with and (suspected) exposure to other viral communicable diseases: Secondary | ICD-10-CM | POA: Diagnosis not present

## 2020-05-01 DIAGNOSIS — H401132 Primary open-angle glaucoma, bilateral, moderate stage: Secondary | ICD-10-CM | POA: Diagnosis not present

## 2020-05-01 DIAGNOSIS — H409 Unspecified glaucoma: Secondary | ICD-10-CM | POA: Diagnosis not present

## 2020-05-12 DIAGNOSIS — I10 Essential (primary) hypertension: Secondary | ICD-10-CM | POA: Diagnosis not present

## 2020-05-12 DIAGNOSIS — J449 Chronic obstructive pulmonary disease, unspecified: Secondary | ICD-10-CM | POA: Diagnosis not present

## 2020-05-22 ENCOUNTER — Inpatient Hospital Stay (HOSPITAL_COMMUNITY): Payer: Medicare HMO

## 2020-05-29 ENCOUNTER — Ambulatory Visit (HOSPITAL_COMMUNITY): Payer: Medicare HMO | Admitting: Nurse Practitioner

## 2020-05-29 ENCOUNTER — Ambulatory Visit: Payer: Medicare HMO | Admitting: Internal Medicine

## 2020-06-02 DIAGNOSIS — Z20828 Contact with and (suspected) exposure to other viral communicable diseases: Secondary | ICD-10-CM | POA: Diagnosis not present

## 2020-06-11 DIAGNOSIS — I10 Essential (primary) hypertension: Secondary | ICD-10-CM | POA: Diagnosis not present

## 2020-06-11 DIAGNOSIS — R739 Hyperglycemia, unspecified: Secondary | ICD-10-CM | POA: Diagnosis not present

## 2020-06-12 DIAGNOSIS — M542 Cervicalgia: Secondary | ICD-10-CM | POA: Diagnosis not present

## 2020-06-12 DIAGNOSIS — Z79891 Long term (current) use of opiate analgesic: Secondary | ICD-10-CM | POA: Diagnosis not present

## 2020-06-12 DIAGNOSIS — M25519 Pain in unspecified shoulder: Secondary | ICD-10-CM | POA: Diagnosis not present

## 2020-06-12 DIAGNOSIS — M5416 Radiculopathy, lumbar region: Secondary | ICD-10-CM | POA: Diagnosis not present

## 2020-06-12 DIAGNOSIS — M549 Dorsalgia, unspecified: Secondary | ICD-10-CM | POA: Diagnosis not present

## 2020-06-12 DIAGNOSIS — M545 Low back pain, unspecified: Secondary | ICD-10-CM | POA: Diagnosis not present

## 2020-06-19 ENCOUNTER — Ambulatory Visit: Payer: Medicare HMO | Admitting: Internal Medicine

## 2020-07-03 DIAGNOSIS — M5459 Other low back pain: Secondary | ICD-10-CM | POA: Diagnosis not present

## 2020-07-03 DIAGNOSIS — M5416 Radiculopathy, lumbar region: Secondary | ICD-10-CM | POA: Diagnosis not present

## 2020-07-08 DIAGNOSIS — Z20828 Contact with and (suspected) exposure to other viral communicable diseases: Secondary | ICD-10-CM | POA: Diagnosis not present

## 2020-07-10 ENCOUNTER — Encounter: Payer: Self-pay | Admitting: Internal Medicine

## 2020-07-10 ENCOUNTER — Ambulatory Visit (INDEPENDENT_AMBULATORY_CARE_PROVIDER_SITE_OTHER): Payer: Medicare HMO | Admitting: Internal Medicine

## 2020-07-10 ENCOUNTER — Other Ambulatory Visit: Payer: Self-pay

## 2020-07-10 VITALS — BP 177/84 | HR 58 | Temp 98.5°F | Ht 66.0 in | Wt 253.4 lb

## 2020-07-10 DIAGNOSIS — D126 Benign neoplasm of colon, unspecified: Secondary | ICD-10-CM

## 2020-07-10 DIAGNOSIS — R1319 Other dysphagia: Secondary | ICD-10-CM

## 2020-07-10 DIAGNOSIS — H4010X1 Unspecified open-angle glaucoma, mild stage: Secondary | ICD-10-CM | POA: Diagnosis not present

## 2020-07-10 NOTE — Patient Instructions (Signed)
Will plan for an EGD and colonoscopy (dysphagia and hx of polyps)  Will discuss sedation with the anesthesiologist - will avoid propofol at patients request  Continue omeprazole 40 mg daily for GERD  Further recommendations to follow

## 2020-07-10 NOTE — Progress Notes (Signed)
Primary Care Physician:  Rosita Fire, MD Primary Gastroenterologist:  Dr. Gala Romney  Pre-Procedure History & Physical: HPI:  Megan Odom is a 74 y.o. female here for to discuss rescheduling EGD to further evaluate dysphagia/history of HCV cirrhosis and surveillance colonoscopy (history of colonic adenoma). She presented for her scheduled EGD and colonoscopy in April of this year.  After propofol anesthesia was administered, and before attempting the procedure, patient became acutely dyspneic/hypoxemic which ultimately required endotracheal intubation and admission to the ICU.  Hospital course complicated by aspiration pneumonia.  She was treated and recovered uneventfully.  Understandably, she is concerned about pursuing these procedures.  She stated she did not remember seeing me during her hospitalization although I was there.  She is interested in further evaluating of her esophagus because she does have ongoing  esophageal dysphagia to solids and a history of adenomatous colonic polyps and is somewhat overdue for surveillance examination.  She would like me to be the only 1 there to oversee/perform endoscopy and manage anesthesia.  It is notable this lady underwent underwent an uneventful colonoscopy back in 2015 by me utilizing Demerol 75 mg and Versed 4 mg IV.   Past Medical History:  Diagnosis Date  . Arthritis   . Asthma   . Cirrhosis ([place])   . Depression   . Hep C w/o coma, chronic ([place])    eradicated with Harvoni 2015.   Marland Kitchen Hyperlipemia   . Hypertension     Past Surgical History:  Procedure Laterality Date  . ABDOMINAL HYSTERECTOMY    . APPENDECTOMY    . BREAST SURGERY     traumatic injury  . CHOLECYSTECTOMY    . COLONOSCOPY N/A 07/03/2014   Dr. Gala Romney: 5 mm and 1 cm tubular adenomas removed, diverticulosis.  Next colonoscopy in 2018.    Prior to Admission medications   Medication Sig Start Date End Date Taking? Authorizing Provider  albuterol (VENTOLIN HFA)  108 (90 Base) MCG/ACT inhaler Inhale 2 puffs into the lungs every 4 (four) hours as needed for wheezing or shortness of breath. 07/05/19  Yes Estill Dooms, NP  aspirin EC 81 MG tablet Take 81 mg by mouth daily.     Yes [provider]  cholecalciferol (VITAMIN D3) 25 MCG (1000 UNIT) tablet Take 1,000 Units by mouth daily.   Yes [provider]  clotrimazole-betamethasone (LOTRISONE) cream Apply 1 application topically 2 (two) times daily.  07/10/19  Yes [provider]  DULoxetine (CYMBALTA) 60 MG capsule Take 60 mg by mouth daily.  07/28/19  Yes [provider]  fluticasone (FLONASE) 50 MCG/ACT nasal spray Place 2 sprays into the nose daily as needed for allergies.    Yes [provider]  furosemide (LASIX) 40 MG tablet Take 40 mg by mouth daily.     Yes [provider]  Gabapentin, Once-Daily, 300 & 600 MG MISC Take by mouth daily.   Yes [provider]  HYDROcodone-acetaminophen (NORCO) 7.5-325 MG tablet Take 1-2 tablets by mouth in the morning and at bedtime.  08/09/19  Yes [provider]  ipratropium-albuterol (DUONEB) 0.5-2.5 (3) MG/3ML SOLN Take 3 mLs by nebulization every 6 (six) hours as needed (shortness of breath or wheezing).  04/20/19  Yes [provider]  ketoconazole (NIZORAL) 2 % cream Apply 1 application topically daily as needed for irritation.  08/08/19  Yes [provider]  losartan (COZAAR) 100 MG tablet Take 100 mg by mouth daily.   Yes [provider]  Naldemedine Tosylate (SYMPROIC) 0.2 MG TABS Take 1 tablet by mouth daily.   Yes [provider]  Omega-3 Fatty Acids (FISH OIL) 1000 MG CAPS Take 1,000 mg by mouth daily.   Yes [provider]  omeprazole (PRILOSEC) 40 MG capsule Take 40 mg by mouth daily.    Yes [provider]  ondansetron (ZOFRAN) 4 MG tablet Take 1 tablet (4 mg total) by mouth every 8 (eight) hours as needed for nausea or  vomiting. 04/09/20  Yes Mahala Menghini, PA-C  potassium chloride (K-DUR) 10 MEQ tablet Take 40 mEq by mouth daily.    Yes [provider]  spironolactone (ALDACTONE) 25 MG tablet Take 25 mg by mouth daily.  10/16/18  Yes [provider]  verapamil (CALAN-SR) 120 MG CR tablet Take 120 mg by mouth daily.  06/23/16  Yes [provider]    Allergies as of 07/10/2020 - Review Complete 07/10/2020  Allergen Reaction Noted  . Darvocet [propoxyphene n-acetaminophen] Other (See Comments) 02/26/2011  . Latex Swelling 04/24/2012  . Other Other (See Comments) 02/13/2013  . Penicillins Other (See Comments) 02/26/2011  . Tetracyclines & related Other (See Comments) 02/26/2011  . Adhesive [tape] Itching and Rash 12/04/2012    Family History  Problem Relation Age of Onset  . Alcohol abuse Mother   . Early death Mother   . Early death Father   . Alcohol abuse Sister   . HIV Brother   . Diabetes Brother   . Heart disease Brother   . Hepatitis C Daughter   . Hypertension Son   . Heart disease Brother   . Diabetes Brother   . Cancer Maternal Aunt        stomach  . Hypertension Maternal Aunt   . Heart disease Maternal Aunt   . Cancer Maternal Uncle        prostate  . Hypertension Maternal Uncle   . Colon cancer Neg Hx     Social History   Socioeconomic History  . Marital status: Widowed    Spouse name: Not on file  . Number of children: 2  . Years of education: GED  . Highest education level: Not on file  Occupational History  . 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 Smoking status: Current Every Day Smoker    Packs/day: 0.50    Years: 50.00    Pack years: 25.00  . Smokeless tobacco: Never Used  . Tobacco comment: one pack every 2-3 days  Substance and Sexual Activity  . Alcohol use: No    Alcohol/week: 0.0 standard drinks  . Drug use: No  . Sexual activity: Not Currently    Birth control/protection: Surgical    Comment: hyst  Other  Topics Concern  . Not on file  Social History Narrative  . Not on file   Social Determinants of Health   Financial Resource Strain: High Risk  . Difficulty of Paying Living Expenses: Hard  Food Insecurity: No Food Insecurity  . Worried About Charity fundraiser in the Last Year: Never true  . Ran Out of Food in the Last Year: Never true  Transportation Needs: No Transportation Needs  . Lack of Transportation (Medical): No  . Lack of Transportation (Non-Medical): No  Physical Activity: Inactive  . Days of Exercise per Week: 0 days  . Minutes of Exercise per Session: 0 min  Stress: Stress Concern Present  . Feeling of Stress : To some extent  Social Connections: Moderately  Isolated  . Frequency of Communication with Friends and Family: Three times a week  . Frequency of Social Gatherings with Friends and Family: Three times a week  . Attends Religious Services: More than 4 times per year  . Active Member of Clubs or Organizations: No  . Attends Archivist Meetings: Never  . Marital Status: Widowed  Intimate Partner Violence: Not At Risk  . Fear of Current or Ex-Partner: No  . Emotionally Abused: No  . Physically Abused: No  . Sexually Abused: No    Review of Systems: See HPI, otherwise negative ROS  Physical Exam: BP (!) 177/84   Pulse (!) 58   Temp 98.5 F (36.9 C) (Temporal)   Ht 5\' 6"  (1.676 m)   Wt 253 lb 6.4 oz (114.9 kg)   BMI 40.90 kg/m  General:   Alert,   pleasant and cooperative in NAD Neck:  Supple; no masses or thyromegaly. No significant cervical adenopathy. Lungs:  Clear throughout to auscultation.   No wheezes, crackles, or rhonchi. No acute distress. Heart:  Regular rate and rhythm; no murmurs, clicks, rubs,  or gallops. Abdomen: Non-distended, normal bowel sounds.  Soft and nontender without appreciable mass or hepatosplenomegaly.  Pulses:  Normal pulses noted. Extremities:  Without clubbing or edema.  Impression/Plan: 74 year old lady  with a history of multiple colonic adenomas removed in the past who is overdue for surveillance colonoscopy.  Also,  esophageal dysphagia and a history of HCV cirrhosis.  EGD recently recommended with possible esophageal dilation as feasible/appropriate and also to assess for underlying varices ( which I doubt she will have as she appears not to have advanced liver disease - clinically and biochemically). She is quite concerned about sedation issues.  Wants to avoid a repeat of the scenario that unfolded back in April of this year. We discussed the fact she did very well in 2015 with light conscious sedation.  However, the safest approach may be elective intubation at the outset for the performance of both an EGD and a colonoscopy.  I discussed with her the fact that when I perform endoscopy, it is a team approach and multiple individuals assist me including anesthesia, particularly when deeper anesthesia is utilized.  Recommendations: I told her that I would discuss with anesthesia to determine the best practice approach for providing her with a safe experience with her next colonoscopy and EGD. I explained to her that no matter what, under the best of circumstances, there are still inherent risks of the procedure.  The risks, benefits, limitations, imponderables and alternatives regarding both EGD and colonoscopy have been reviewed with the patient. Questions have been answered.  She is agreeable to proceeding.  She is a solid ASA 3.  Once I have discussed with anesthesia, I will get back with the patient and make specific recommendations.  She understands.  She is also requested copies of the records pertaining to her April hospitalization.  I told her,  with proper written consent, we will get those to her right away.     Notice: This dictation was prepared with Dragon dictation along with smaller phrase technology. Any transcriptional errors that result from this process are unintentional and  may not be corrected upon review.

## 2020-07-12 DIAGNOSIS — R739 Hyperglycemia, unspecified: Secondary | ICD-10-CM | POA: Diagnosis not present

## 2020-07-12 DIAGNOSIS — I10 Essential (primary) hypertension: Secondary | ICD-10-CM | POA: Diagnosis not present

## 2020-07-24 ENCOUNTER — Other Ambulatory Visit (HOSPITAL_COMMUNITY): Payer: Self-pay | Admitting: Internal Medicine

## 2020-07-24 DIAGNOSIS — Z1231 Encounter for screening mammogram for malignant neoplasm of breast: Secondary | ICD-10-CM

## 2020-07-24 DIAGNOSIS — Z0001 Encounter for general adult medical examination with abnormal findings: Secondary | ICD-10-CM | POA: Diagnosis not present

## 2020-07-24 DIAGNOSIS — Z23 Encounter for immunization: Secondary | ICD-10-CM | POA: Diagnosis not present

## 2020-07-24 DIAGNOSIS — J449 Chronic obstructive pulmonary disease, unspecified: Secondary | ICD-10-CM | POA: Diagnosis not present

## 2020-07-24 DIAGNOSIS — F17210 Nicotine dependence, cigarettes, uncomplicated: Secondary | ICD-10-CM | POA: Diagnosis not present

## 2020-07-24 DIAGNOSIS — F331 Major depressive disorder, recurrent, moderate: Secondary | ICD-10-CM | POA: Diagnosis not present

## 2020-07-24 DIAGNOSIS — Z1331 Encounter for screening for depression: Secondary | ICD-10-CM | POA: Diagnosis not present

## 2020-07-24 DIAGNOSIS — Z1389 Encounter for screening for other disorder: Secondary | ICD-10-CM | POA: Diagnosis not present

## 2020-08-12 ENCOUNTER — Other Ambulatory Visit: Payer: Self-pay

## 2020-08-12 ENCOUNTER — Ambulatory Visit: Payer: Medicare HMO | Attending: Internal Medicine

## 2020-08-12 DIAGNOSIS — Z23 Encounter for immunization: Secondary | ICD-10-CM

## 2020-08-12 NOTE — Progress Notes (Signed)
   Covid-19 Vaccination Clinic  Name:  JEARLDINE CASSADY    MRN: [id number] DOB: 02/13/1946  08/12/2020  Ms. Muhs was observed post Covid-19 immunization for 15 minutes without incident. She was provided with Vaccine Information Sheet and instruction to access the V-Safe system.   Ms. Kimbrough was instructed to call 911 with any severe reactions post vaccine: Marland Kitchen Difficulty breathing  . Swelling of face and throat  . A fast heartbeat  . A bad rash all over body  . Dizziness and weakness   Immunizations Administered    Name Date Dose VIS Date Route   Pfizer COVID-19 Vaccine 08/12/2020  3:40 PM 0.3 mL 06/10/2020 Intramuscular   Manufacturer: [employer]   Lot: [id number]   NDC: [address]-3

## 2020-08-24 DIAGNOSIS — I10 Essential (primary) hypertension: Secondary | ICD-10-CM | POA: Diagnosis not present

## 2020-08-24 DIAGNOSIS — J449 Chronic obstructive pulmonary disease, unspecified: Secondary | ICD-10-CM | POA: Diagnosis not present

## 2020-08-28 ENCOUNTER — Ambulatory Visit (HOSPITAL_COMMUNITY): Payer: Medicare HMO

## 2020-09-24 DIAGNOSIS — J449 Chronic obstructive pulmonary disease, unspecified: Secondary | ICD-10-CM | POA: Diagnosis not present

## 2020-09-24 DIAGNOSIS — I10 Essential (primary) hypertension: Secondary | ICD-10-CM | POA: Diagnosis not present

## 2020-10-02 DIAGNOSIS — M542 Cervicalgia: Secondary | ICD-10-CM | POA: Diagnosis not present

## 2020-10-02 DIAGNOSIS — M25519 Pain in unspecified shoulder: Secondary | ICD-10-CM | POA: Diagnosis not present

## 2020-10-02 DIAGNOSIS — Z0001 Encounter for general adult medical examination with abnormal findings: Secondary | ICD-10-CM | POA: Diagnosis not present

## 2020-10-02 DIAGNOSIS — M5416 Radiculopathy, lumbar region: Secondary | ICD-10-CM | POA: Diagnosis not present

## 2020-10-02 DIAGNOSIS — Z79891 Long term (current) use of opiate analgesic: Secondary | ICD-10-CM | POA: Diagnosis not present

## 2020-10-02 DIAGNOSIS — M549 Dorsalgia, unspecified: Secondary | ICD-10-CM | POA: Diagnosis not present

## 2020-10-02 DIAGNOSIS — D892 Hypergammaglobulinemia, unspecified: Secondary | ICD-10-CM | POA: Diagnosis not present

## 2020-10-02 DIAGNOSIS — M545 Low back pain, unspecified: Secondary | ICD-10-CM | POA: Diagnosis not present

## 2020-10-02 DIAGNOSIS — I10 Essential (primary) hypertension: Secondary | ICD-10-CM | POA: Diagnosis not present

## 2020-10-16 DIAGNOSIS — M5416 Radiculopathy, lumbar region: Secondary | ICD-10-CM | POA: Diagnosis not present

## 2020-10-16 DIAGNOSIS — M5451 Vertebrogenic low back pain: Secondary | ICD-10-CM | POA: Diagnosis not present

## 2020-10-19 ENCOUNTER — Telehealth: Payer: Self-pay

## 2020-10-19 NOTE — Telephone Encounter (Signed)
-----   Message from [place] sent at 10/19/2020  9:04 AM EST ----- Called patient to scheduled and she said she "was scared to do that again".  Asked if she could get back to [place] about an appointment.   ----- Message ----- From: Claudina Lick, LPN Sent: 2/98/4730   8:24 AM EST To: Sydell Axon [place]  Please schedule ov ----- Message ----- From: Daneil Dolin, MD Sent: 10/12/2020  12:46 PM EST To: Claudina Lick, LPN  She is a high risk pt; she needs an ov to discuss with APP face to face. ----- Message ----- From: Claudina Lick, LPN Sent: 8/56/9437   8:49 AM EST To: Daneil Dolin, MD  Hi Dr.Rourk, I spoke with this pt today, per your last ov note, it looks like you were going to get back with her about scheduling her procedure. She is asking when she needs to have it done. I told her that you were off this week and I would get back with her next week and she said that was fine.

## 2020-10-22 DIAGNOSIS — F17200 Nicotine dependence, unspecified, uncomplicated: Secondary | ICD-10-CM | POA: Diagnosis not present

## 2020-10-22 DIAGNOSIS — I10 Essential (primary) hypertension: Secondary | ICD-10-CM | POA: Diagnosis not present

## 2020-11-03 ENCOUNTER — Ambulatory Visit: Payer: Medicare HMO | Admitting: Internal Medicine

## 2020-11-22 DIAGNOSIS — I10 Essential (primary) hypertension: Secondary | ICD-10-CM | POA: Diagnosis not present

## 2020-11-22 DIAGNOSIS — F17200 Nicotine dependence, unspecified, uncomplicated: Secondary | ICD-10-CM | POA: Diagnosis not present

## 2020-11-27 ENCOUNTER — Encounter: Payer: Self-pay | Admitting: Internal Medicine

## 2020-11-27 ENCOUNTER — Ambulatory Visit: Payer: Medicare Other | Admitting: Internal Medicine

## 2020-12-22 DIAGNOSIS — I10 Essential (primary) hypertension: Secondary | ICD-10-CM | POA: Diagnosis not present

## 2020-12-22 DIAGNOSIS — M170 Bilateral primary osteoarthritis of knee: Secondary | ICD-10-CM | POA: Diagnosis not present

## 2021-01-22 DIAGNOSIS — M25519 Pain in unspecified shoulder: Secondary | ICD-10-CM | POA: Diagnosis not present

## 2021-01-22 DIAGNOSIS — Z79891 Long term (current) use of opiate analgesic: Secondary | ICD-10-CM | POA: Diagnosis not present

## 2021-01-22 DIAGNOSIS — M549 Dorsalgia, unspecified: Secondary | ICD-10-CM | POA: Diagnosis not present

## 2021-01-22 DIAGNOSIS — M545 Low back pain, unspecified: Secondary | ICD-10-CM | POA: Diagnosis not present

## 2021-01-22 DIAGNOSIS — M5416 Radiculopathy, lumbar region: Secondary | ICD-10-CM | POA: Diagnosis not present

## 2021-01-22 DIAGNOSIS — M542 Cervicalgia: Secondary | ICD-10-CM | POA: Diagnosis not present

## 2021-02-05 DIAGNOSIS — I10 Essential (primary) hypertension: Secondary | ICD-10-CM | POA: Diagnosis not present

## 2021-02-05 DIAGNOSIS — Z1389 Encounter for screening for other disorder: Secondary | ICD-10-CM | POA: Diagnosis not present

## 2021-02-05 DIAGNOSIS — J449 Chronic obstructive pulmonary disease, unspecified: Secondary | ICD-10-CM | POA: Diagnosis not present

## 2021-02-05 DIAGNOSIS — F17210 Nicotine dependence, cigarettes, uncomplicated: Secondary | ICD-10-CM | POA: Diagnosis not present

## 2021-02-05 DIAGNOSIS — Z0001 Encounter for general adult medical examination with abnormal findings: Secondary | ICD-10-CM | POA: Diagnosis not present

## 2021-02-08 ENCOUNTER — Emergency Department (HOSPITAL_COMMUNITY)
Admission: EM | Admit: 2021-02-08 | Discharge: 2021-02-08 | Disposition: A | Discharge status: Home / Self Care | Payer: Medicare Other | Attending: Emergency Medicine | Admitting: Emergency Medicine

## 2021-02-08 ENCOUNTER — Encounter (HOSPITAL_COMMUNITY): Payer: Self-pay | Admitting: Emergency Medicine

## 2021-02-08 ENCOUNTER — Emergency Department (HOSPITAL_COMMUNITY): Payer: Medicare Other

## 2021-02-08 ENCOUNTER — Other Ambulatory Visit: Payer: Self-pay

## 2021-02-08 DIAGNOSIS — F17210 Nicotine dependence, cigarettes, uncomplicated: Secondary | ICD-10-CM | POA: Insufficient documentation

## 2021-02-08 DIAGNOSIS — J111 Influenza due to unidentified influenza virus with other respiratory manifestations: Secondary | ICD-10-CM | POA: Diagnosis not present

## 2021-02-08 DIAGNOSIS — J45909 Unspecified asthma, uncomplicated: Secondary | ICD-10-CM | POA: Insufficient documentation

## 2021-02-08 DIAGNOSIS — Z91040 Latex allergy status: Secondary | ICD-10-CM | POA: Diagnosis not present

## 2021-02-08 DIAGNOSIS — Z20822 Contact with and (suspected) exposure to covid-19: Secondary | ICD-10-CM | POA: Diagnosis not present

## 2021-02-08 DIAGNOSIS — I5032 Chronic diastolic (congestive) heart failure: Secondary | ICD-10-CM | POA: Diagnosis not present

## 2021-02-08 DIAGNOSIS — Z7982 Long term (current) use of aspirin: Secondary | ICD-10-CM | POA: Diagnosis not present

## 2021-02-08 DIAGNOSIS — R059 Cough, unspecified: Secondary | ICD-10-CM | POA: Diagnosis not present

## 2021-02-08 DIAGNOSIS — R519 Headache, unspecified: Secondary | ICD-10-CM | POA: Diagnosis not present

## 2021-02-08 DIAGNOSIS — Z79899 Other long term (current) drug therapy: Secondary | ICD-10-CM | POA: Insufficient documentation

## 2021-02-08 DIAGNOSIS — I110 Hypertensive heart disease with heart failure: Secondary | ICD-10-CM | POA: Insufficient documentation

## 2021-02-08 LAB — CBC WITH DIFFERENTIAL/PLATELET
Abs Immature Granulocytes: 0.04 10*3/uL (ref 0.00–0.07)
Basophils Absolute: 0.1 10*3/uL (ref 0.0–0.1)
Basophils Relative: 1 %
Eosinophils Absolute: 0.1 10*3/uL (ref 0.0–0.5)
Eosinophils Relative: 1 %
HCT: 39.7 % (ref 36.0–46.0)
Hemoglobin: 12.4 g/dL (ref 12.0–15.0)
Immature Granulocytes: 0 %
Lymphocytes Relative: 14 %
Lymphs Abs: 1.4 10*3/uL (ref 0.7–4.0)
MCH: 29.4 pg (ref 26.0–34.0)
MCHC: 31.2 g/dL (ref 30.0–36.0)
MCV: 94.1 fL (ref 80.0–100.0)
Monocytes Absolute: 0.8 10*3/uL (ref 0.1–1.0)
Monocytes Relative: 8 %
Neutro Abs: 7.7 10*3/uL (ref 1.7–7.7)
Neutrophils Relative %: 76 %
Platelets: 295 10*3/uL (ref 150–400)
RBC: 4.22 MIL/uL (ref 3.87–5.11)
RDW: 12.3 % (ref 11.5–15.5)
WBC: 10 10*3/uL (ref 4.0–10.5)
nRBC: 0 % (ref 0.0–0.2)

## 2021-02-08 LAB — COMPREHENSIVE METABOLIC PANEL
ALT: 14 U/L (ref 0–44)
AST: 18 U/L (ref 15–41)
Albumin: 3.9 g/dL (ref 3.5–5.0)
Alkaline Phosphatase: 130 U/L — ABNORMAL HIGH (ref 38–126)
Anion gap: 8 (ref 5–15)
BUN: 15 mg/dL (ref 8–23)
CO2: 27 mmol/L (ref 22–32)
Calcium: 9.1 mg/dL (ref 8.9–10.3)
Chloride: 103 mmol/L (ref 98–111)
Creatinine, Ser: 0.97 mg/dL (ref 0.44–1.00)
GFR, Estimated: >60 mL/min (ref >60)
Glucose, Bld: 102 mg/dL — ABNORMAL HIGH (ref 70–99)
Potassium: 4 mmol/L (ref 3.5–5.1)
Sodium: 138 mmol/L (ref 135–145)
Total Bilirubin: 0.8 mg/dL (ref 0.3–1.2)
Total Protein: 8.7 g/dL — ABNORMAL HIGH (ref 6.5–8.1)

## 2021-02-08 LAB — RESP PANEL BY RT-PCR (FLU A&B, COVID) ARPGX2
Influenza A by PCR: NEGATIVE
Influenza B by PCR: NEGATIVE
SARS Coronavirus 2 by RT PCR: NEGATIVE

## 2021-02-08 MED ORDER — GUAIFENESIN-DM 100-10 MG/5ML PO SYRP
5.0000 mL | ORAL_SOLUTION | Freq: Once | ORAL | Status: AC
  Administered 2021-02-08: 5 mL via ORAL
  Filled 2021-02-08: qty 5

## 2021-02-08 MED ORDER — PROCHLORPERAZINE MALEATE 5 MG PO TABS
10.0000 mg | ORAL_TABLET | Freq: Once | ORAL | Status: AC
  Administered 2021-02-08: 10 mg via ORAL
  Filled 2021-02-08: qty 2

## 2021-02-08 MED ORDER — PREDNISONE 20 MG PO TABS
20.0000 mg | ORAL_TABLET | Freq: Once | ORAL | Status: AC
  Administered 2021-02-08: 20 mg via ORAL
  Filled 2021-02-08: qty 1

## 2021-02-08 MED ORDER — DOXYCYCLINE HYCLATE 100 MG PO CAPS: 100.0000 mg | ORAL_CAPSULE | Freq: Two times a day (BID) | ORAL | 0 refills | Status: DC

## 2021-02-08 NOTE — Discharge Instructions (Addendum)
You have been seen here for URI like symptoms.  X-ray reveals you might have a possible early pneumonia of start you on antibiotics please take as prescribed.  For your cough I recommend over-the-counter cough suppressant medication like Mucinex.  I recommend taking Tylenol for fever control and ibuprofen for pain control please follow dosing on the back of bottle.  I recommend staying hydrated and if you do not an appetite, I recommend soups as this will provide you with fluids and calories.  Your Covid test is pending I recommend self quarantine until you get your results back on MyChart.    If you are Covid positive you must self quarantine for 5 days starting on symptom onset, if at the end of those 5 days you are feeling better you may return back to school/work, if you continue to have symptoms you must self quarantine for additional 5 days.  I would like you to contact "post Covid care" as they will provide you with information how to manage your Covid symptoms if you are Covid positive.     Please follow-up with your PCP in 1 week time for repeat chest x-ray.  Come back to the emergency department if you develop chest pain, shortness of breath, severe abdominal pain, uncontrolled nausea, vomiting, diarrhea.

## 2021-02-08 NOTE — ED Provider Notes (Signed)
[facility] Provider Note   CSN: [id number] Arrival date & time: 02/08/21  1138     History Chief Complaint  Patient presents with   Cough    Megan Odom is a 74 y.o. female.  HPI  Patient with significant medical history of asthma, cirrhosis, hyperlipidemia, hypertension presents to the emergency department with chief complaint of URI-like symptoms.  Patient states she has been having worsening headaches, nasal congestion, cough, subjective chills, nausea, vomiting, diarrhea for the last 2 days.  Patient states she came in contact with someone with COVID, she is up-to-date on her COVID and her influenza shots, she is not immunocompromise.  Patient states that she has had an episode of vomiting this morning with diarrhea she denies seeing hematemesis, hematochezia, states that she still able to tolerate p.o..  Patient states that she feels short of breath and has chest pain when she is coughing, when she is not coughing chest pain and the shortness of breath resolved.  patient also states that she has a severe headache, states been worse for last 2 days, she does not endorse change in vision, paresthesias or weakness of her lower extremities.  Past Medical History:  Diagnosis Date   Arthritis    Asthma    Cirrhosis ([place])    Depression    Hep C w/o coma, chronic ([place])    eradicated with Harvoni 2015.    Hyperlipemia    Hypertension     Patient Active Problem List   Diagnosis Date Noted   Acute respiratory failure ([place]) 11/21/2019   Acute respiratory failure with hypoxia ([facility]) 11/21/2019   Elevated serum immunoglobulin free light chain level 10/14/2019   Elevated serum protein level 09/23/2019   Screening for colorectal cancer 07/05/2019   Smear, vaginal, as part of routine gynecological examination 07/05/2019   Constipation 05/10/2019   Nausea without vomiting 11/07/2018   Colon adenomas 11/07/2018   Intractable abdominal pain 03/25/2016   Nausea  vomiting and diarrhea 03/25/2016   AKI (acute kidney injury) ([place]) 03/25/2016   Depression with anxiety 03/25/2016   Chronic diastolic (congestive) heart failure ([place]) 03/25/2016   Hyperglycemia 03/25/2016   GERD (gastroesophageal reflux disease) 03/25/2016   Hypokalemia 03/25/2016   Hypocalcemia 03/25/2016   Headache 11/20/2014   Dizziness 11/20/2014   Hepatic cirrhosis ([place]) 11/05/2014   Chest pain 11/05/2014   Dysphagia 11/05/2014   History of hepatitis C 11/05/2014   Sleep apnea, obstructive 01/17/2013   Paroxysmal atrial tachycardia ([place]) 01/17/2013   Essential hypertension 01/17/2013   Morbid obesity ([place]) 01/17/2013   Chest pain at rest 01/17/2013   Difficulty in walking(719.7) 02/17/2012   Bilateral leg weakness 02/17/2012   Back pain 02/16/2012   Bilateral anterior knee pain 02/16/2012   Knee pain, left 02/16/2012    Past Surgical History:  Procedure Laterality Date   ABDOMINAL HYSTERECTOMY     APPENDECTOMY     BREAST SURGERY     traumatic injury   CHOLECYSTECTOMY     COLONOSCOPY N/A 07/03/2014   Dr. Gala Romney: 5 mm and 1 cm tubular adenomas removed, diverticulosis.  Next colonoscopy in 2018.     OB History     Gravida  4   Para  2   Term  2   Preterm      AB  2   Living  2      SAB  2   IAB      Ectopic      Multiple  Live Births              Family History  Problem Relation Age of Onset   Alcohol abuse Mother    Early death Mother    Early death Father    Alcohol abuse Sister    HIV Brother    Diabetes Brother    Heart disease Brother    Hepatitis C Daughter    Hypertension Son    Heart disease Brother    Diabetes Brother    Cancer Maternal Aunt        stomach   Hypertension Maternal Aunt    Heart disease Maternal Aunt    Cancer Maternal Uncle        prostate   Hypertension Maternal Uncle    Colon cancer Neg Hx     Social History   Tobacco Use   Smoking status: Every Day    Packs/day: 0.50    Years: 50.00     Pack years: 25.00    Types: Cigarettes   Smokeless tobacco: Never   Tobacco comments:    one pack every 2-3 days  Substance Use Topics   Alcohol use: No    Alcohol/week: 0.0 standard drinks   Drug use: No    Home Medications Prior to Admission medications   Medication Sig Start Date End Date Taking? Authorizing Provider  doxycycline (VIBRAMYCIN) 100 MG capsule Take 1 capsule (100 mg total) by mouth 2 (two) times daily. 02/08/21  Yes Marcello Fennel, PA-C  albuterol (VENTOLIN HFA) 108 (90 Base) MCG/ACT inhaler Inhale 2 puffs into the lungs every 4 (four) hours as needed for wheezing or shortness of breath. 07/05/19   Estill Dooms, NP  aspirin EC 81 MG tablet Take 81 mg by mouth daily.      [provider]  cholecalciferol (VITAMIN D3) 25 MCG (1000 UNIT) tablet Take 1,000 Units by mouth daily.    [provider]  clotrimazole-betamethasone (LOTRISONE) cream Apply 1 application topically 2 (two) times daily.  07/10/19   [provider]  DULoxetine (CYMBALTA) 60 MG capsule Take 60 mg by mouth daily.  07/28/19   [provider]  fluticasone (FLONASE) 50 MCG/ACT nasal spray Place 2 sprays into the nose daily as needed for allergies.     [provider]  furosemide (LASIX) 40 MG tablet Take 40 mg by mouth daily.      [provider]  Gabapentin, Once-Daily, 300 & 600 MG MISC Take by mouth daily.    [provider]  HYDROcodone-acetaminophen (NORCO) 7.5-325 MG tablet Take 1-2 tablets by mouth in the morning and at bedtime.  08/09/19   [provider]  ipratropium-albuterol (DUONEB) 0.5-2.5 (3) MG/3ML SOLN Take 3 mLs by nebulization every 6 (six) hours as needed (shortness of breath or wheezing).  04/20/19   [provider]  ketoconazole (NIZORAL) 2 % cream Apply 1 application topically daily as needed for irritation.  08/08/19   [provider]  losartan (COZAAR) 100 MG tablet Take 100 mg by mouth  daily.    [provider]  Naldemedine Tosylate (SYMPROIC) 0.2 MG TABS Take 1 tablet by mouth daily.    [provider]  Omega-3 Fatty Acids (FISH OIL) 1000 MG CAPS Take 1,000 mg by mouth daily.    [provider]  omeprazole (PRILOSEC) 40 MG capsule Take 40 mg by mouth daily.     [provider]  ondansetron (ZOFRAN) 4 MG tablet Take 1 tablet (4 mg total) by  mouth every 8 (eight) hours as needed for nausea or vomiting. 04/09/20   Mahala Menghini, PA-C  potassium chloride (K-DUR) 10 MEQ tablet Take 40 mEq by mouth daily.     [provider]  spironolactone (ALDACTONE) 25 MG tablet Take 25 mg by mouth daily.  10/16/18   [provider]  verapamil (CALAN-SR) 120 MG CR tablet Take 120 mg by mouth daily.  06/23/16   [provider]    Allergies    Darvocet [propoxyphene n-acetaminophen], Latex, Other, Penicillins, Tetracyclines & related, and Adhesive [tape]  Review of Systems   Review of Systems  Constitutional:  Positive for chills. Negative for fever.  HENT:  Positive for congestion and sore throat.   Respiratory:  Positive for cough. Negative for shortness of breath.   Cardiovascular:  Negative for chest pain.  Gastrointestinal:  Positive for diarrhea, nausea and vomiting. Negative for abdominal pain.  Genitourinary:  Negative for enuresis.  Musculoskeletal:  Negative for back pain.  Skin:  Negative for rash.  Neurological:  Positive for headaches. Negative for dizziness.  Hematological:  Does not bruise/bleed easily.   Physical Exam Updated Vital Signs BP 131/83   Pulse 70   Temp 98.7 F (37.1 C)   Resp 18   Ht 5' 5"  (1.651 m)   Wt 108.9 kg   SpO2 96%   BMI 39.94 kg/m   Physical Exam Vitals and nursing note reviewed.  Constitutional:      General: She is not in acute distress.    Appearance: She is not ill-appearing.  HENT:     Head: Normocephalic and atraumatic.     Right Ear: Tympanic membrane, ear canal and  external ear normal.     Left Ear: Tympanic membrane, ear canal and external ear normal.     Nose: Congestion present.     Comments: Erythematous turbinates    Mouth/Throat:     Mouth: Mucous membranes are moist.     Pharynx: Oropharynx is clear. No oropharyngeal exudate or posterior oropharyngeal erythema.  Eyes:     Conjunctiva/sclera: Conjunctivae normal.  Cardiovascular:     Rate and Rhythm: Normal rate and regular rhythm.     Pulses: Normal pulses.     Heart sounds: No murmur heard.   No friction rub. No gallop.  Pulmonary:     Effort: No respiratory distress.     Breath sounds: No wheezing, rhonchi or rales.  Abdominal:     Palpations: Abdomen is soft.     Tenderness: There is no abdominal tenderness. There is no right CVA tenderness or left CVA tenderness.  Musculoskeletal:     Right lower leg: No edema.     Left lower leg: No edema.  Skin:    General: Skin is warm and dry.  Neurological:     Mental Status: She is alert.  Psychiatric:        Mood and Affect: Mood normal.    ED Results / Procedures / Treatments   Labs (all labs ordered are listed, but only abnormal results are displayed) Labs Reviewed  COMPREHENSIVE METABOLIC PANEL - Abnormal; Notable for the following components:      Result Value   Glucose, Bld 102 (*)    Total Protein 8.7 (*)    Alkaline Phosphatase 130 (*)    All other components within normal limits  RESP PANEL BY RT-PCR (FLU A&B, COVID) [id number]  CBC WITH DIFFERENTIAL/PLATELET    EKG None  Radiology DG Chest 2 View  Result Date:  02/08/2021 CLINICAL DATA:  Cough with headache. EXAM: CHEST - 2 VIEW COMPARISON:  November 22, 2019. FINDINGS: Chronic bronchitic change. Faint opacities in the peripheral right lung and lung bases. No visible pleural effusions or pneumothorax. Similar cardiomediastinal silhouette. No acute osseous abnormality. IMPRESSION: Chronic bronchitic change with faint patchy opacities in the peripheral right lung and lung  bases, possibly early pneumonia. Recommend imaging follow-up to resolution. Electronically Signed   By: Margaretha Sheffield MD   On: 02/08/2021 12:52    Procedures Procedures   Medications Ordered in ED Medications  prochlorperazine (COMPAZINE) tablet 10 mg (has no administration in time range)  predniSONE (DELTASONE) tablet 20 mg (has no administration in time range)  guaiFENesin-dextromethorphan (ROBITUSSIN DM) 100-10 MG/5ML syrup 5 mL (has no administration in time range)    ED Course  I have reviewed the triage vital signs and the nursing notes.  Pertinent labs & imaging results that were available during my care of the patient were reviewed by me and considered in my medical decision making (see chart for details).    MDM Rules/Calculators/A&P                         Initial impression-patient presents with URI-like symptoms.  She is alert, does not appear in acute stress, vital signs reassuring.  Will obtain chest x-ray, respiratory panel, basic lab work-up and reassess.  Work-up-CBC unremarkable, CMP shows hyperglycemia of 102, slightly elevated alk phos of 130.  Chest x-ray reveals chronic bronchitis, faint opacities in the peripheral right lung and lung bases possible for pneumonia.  Reassessment-there is no finding that patient was complaining of headache, throat pain, coughing.  Will provide patient with medication.  Updated patient on lab work and imaging, she has no complaints this time.  Patient agreeable for discharge.  Rule out- Low suspicion for systemic infection as patient is nontoxic-appearing, vital signs reassuring, no obvious source infection noted on exam.  I have low suspicion for PE as patient denies pleuritic chest pain, shortness of breath, vital signs are reassuring.  Low suspicion for strep throat as oropharynx was visualized, no erythema or exudates noted.  Low suspicion patient would need  hospitalized due to viral infection or Covid as vital signs  reassuring, patient is not in respiratory distress.  Low suspicion patient has COVID as she does not have leukocytopenia, imaging on chest today more consistent with a bacterial pneumonia.     Plan-  URI-like symptoms-suspect patient suffering from possible early pneumonia versus viral infection.  will start her on antibiotics, recommend that she stay hydrated, take over-the-counter pain medications, follow-up PCP 1 week's time for repeat chest x-ray.  Vital signs have remained stable, no indication for hospital admission.  Patient given at home care as well strict return precautions.  Patient verbalized that they understood agreed to said plan.  Final Clinical Impression(s) / ED Diagnoses Final diagnoses:  Cough  Influenza-like illness    Rx / DC Orders ED Discharge Orders          Ordered    doxycycline (VIBRAMYCIN) 100 MG capsule  2 times daily        02/08/21 1416             Aron Baba 02/08/21 1419    Noemi Chapel, MD 02/09/21 [phone]

## 2021-02-08 NOTE — ED Triage Notes (Signed)
Pt c/o cough/congestion/body aches x 2 days.

## 2021-02-09 DIAGNOSIS — E559 Vitamin D deficiency, unspecified: Secondary | ICD-10-CM | POA: Diagnosis not present

## 2021-02-09 DIAGNOSIS — I10 Essential (primary) hypertension: Secondary | ICD-10-CM | POA: Diagnosis not present

## 2021-02-09 DIAGNOSIS — Z0001 Encounter for general adult medical examination with abnormal findings: Secondary | ICD-10-CM | POA: Diagnosis not present

## 2021-02-09 DIAGNOSIS — Z79899 Other long term (current) drug therapy: Secondary | ICD-10-CM | POA: Diagnosis not present

## 2021-03-07 DIAGNOSIS — J449 Chronic obstructive pulmonary disease, unspecified: Secondary | ICD-10-CM | POA: Diagnosis not present

## 2021-03-07 DIAGNOSIS — I10 Essential (primary) hypertension: Secondary | ICD-10-CM | POA: Diagnosis not present
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
## 2021-04-07 DIAGNOSIS — I10 Essential (primary) hypertension: Secondary | ICD-10-CM | POA: Diagnosis not present

## 2021-04-07 DIAGNOSIS — J449 Chronic obstructive pulmonary disease, unspecified: Secondary | ICD-10-CM | POA: Diagnosis not present

## 2021-05-08 DIAGNOSIS — J449 Chronic obstructive pulmonary disease, unspecified: Secondary | ICD-10-CM | POA: Diagnosis not present

## 2021-05-08 DIAGNOSIS — I10 Essential (primary) hypertension: Secondary | ICD-10-CM | POA: Diagnosis not present

## 2021-06-07 DIAGNOSIS — I10 Essential (primary) hypertension: Secondary | ICD-10-CM | POA: Diagnosis not present

## 2021-06-07 DIAGNOSIS — J449 Chronic obstructive pulmonary disease, unspecified: Secondary | ICD-10-CM | POA: Diagnosis not present

## 2021-07-08 DIAGNOSIS — I10 Essential (primary) hypertension: Secondary | ICD-10-CM | POA: Diagnosis not present

## 2021-07-08 DIAGNOSIS — J449 Chronic obstructive pulmonary disease, unspecified: Secondary | ICD-10-CM | POA: Diagnosis not present

## 2021-08-07 DIAGNOSIS — J449 Chronic obstructive pulmonary disease, unspecified: Secondary | ICD-10-CM | POA: Diagnosis not present

## 2021-08-07 DIAGNOSIS — I10 Essential (primary) hypertension: Secondary | ICD-10-CM | POA: Diagnosis not present

## 2022-04-25 DIAGNOSIS — E7849 Other hyperlipidemia: Secondary | ICD-10-CM | POA: Diagnosis not present

## 2022-04-25 DIAGNOSIS — I10 Essential (primary) hypertension: Secondary | ICD-10-CM | POA: Diagnosis not present

## 2022-05-25 DIAGNOSIS — I10 Essential (primary) hypertension: Secondary | ICD-10-CM | POA: Diagnosis not present

## 2022-05-25 DIAGNOSIS — E7849 Other hyperlipidemia: Secondary | ICD-10-CM | POA: Diagnosis not present

## 2022-06-24 DIAGNOSIS — G603 Idiopathic progressive neuropathy: Secondary | ICD-10-CM | POA: Diagnosis not present

## 2022-06-24 DIAGNOSIS — M5416 Radiculopathy, lumbar region: Secondary | ICD-10-CM | POA: Diagnosis not present

## 2022-06-24 DIAGNOSIS — M542 Cervicalgia: Secondary | ICD-10-CM | POA: Diagnosis not present

## 2022-06-24 DIAGNOSIS — M130 Polyarthritis, unspecified: Secondary | ICD-10-CM | POA: Diagnosis not present

## 2022-06-24 DIAGNOSIS — M545 Low back pain, unspecified: Secondary | ICD-10-CM | POA: Diagnosis not present

## 2022-06-24 DIAGNOSIS — Z79891 Long term (current) use of opiate analgesic: Secondary | ICD-10-CM | POA: Diagnosis not present

## 2022-06-27 DIAGNOSIS — R519 Headache, unspecified: Secondary | ICD-10-CM | POA: Diagnosis not present

## 2022-06-27 DIAGNOSIS — R69 Illness, unspecified: Secondary | ICD-10-CM | POA: Diagnosis not present

## 2022-06-27 DIAGNOSIS — Z23 Encounter for immunization: Secondary | ICD-10-CM | POA: Diagnosis not present

## 2022-06-27 DIAGNOSIS — I10 Essential (primary) hypertension: Secondary | ICD-10-CM | POA: Diagnosis not present

## 2022-06-28 ENCOUNTER — Emergency Department (HOSPITAL_COMMUNITY)
Admission: EM | Admit: 2022-06-28 | Discharge: 2022-06-28 | Disposition: A | Discharge status: Home / Self Care | Payer: Medicare HMO | Attending: Emergency Medicine | Admitting: Emergency Medicine

## 2022-06-28 ENCOUNTER — Other Ambulatory Visit: Payer: Self-pay

## 2022-06-28 ENCOUNTER — Emergency Department (HOSPITAL_COMMUNITY): Payer: Medicare HMO

## 2022-06-28 ENCOUNTER — Other Ambulatory Visit (HOSPITAL_COMMUNITY): Payer: Self-pay | Admitting: Gerontology

## 2022-06-28 ENCOUNTER — Encounter (HOSPITAL_COMMUNITY): Payer: Self-pay

## 2022-06-28 DIAGNOSIS — R519 Headache, unspecified: Secondary | ICD-10-CM | POA: Insufficient documentation

## 2022-06-28 DIAGNOSIS — M47812 Spondylosis without myelopathy or radiculopathy, cervical region: Secondary | ICD-10-CM | POA: Diagnosis not present

## 2022-06-28 DIAGNOSIS — Z7982 Long term (current) use of aspirin: Secondary | ICD-10-CM | POA: Insufficient documentation

## 2022-06-28 DIAGNOSIS — R001 Bradycardia, unspecified: Secondary | ICD-10-CM | POA: Diagnosis not present

## 2022-06-28 DIAGNOSIS — I959 Hypotension, unspecified: Secondary | ICD-10-CM | POA: Diagnosis not present

## 2022-06-28 DIAGNOSIS — I6523 Occlusion and stenosis of bilateral carotid arteries: Secondary | ICD-10-CM | POA: Diagnosis not present

## 2022-06-28 DIAGNOSIS — Z91040 Latex allergy status: Secondary | ICD-10-CM | POA: Insufficient documentation

## 2022-06-28 DIAGNOSIS — M852 Hyperostosis of skull: Secondary | ICD-10-CM | POA: Diagnosis not present

## 2022-06-28 DIAGNOSIS — R42 Dizziness and giddiness: Secondary | ICD-10-CM | POA: Diagnosis not present

## 2022-06-28 DIAGNOSIS — Z743 Need for continuous supervision: Secondary | ICD-10-CM | POA: Diagnosis not present

## 2022-06-28 DIAGNOSIS — R0689 Other abnormalities of breathing: Secondary | ICD-10-CM | POA: Diagnosis not present

## 2022-06-28 DIAGNOSIS — R55 Syncope and collapse: Secondary | ICD-10-CM

## 2022-06-28 DIAGNOSIS — I6502 Occlusion and stenosis of left vertebral artery: Secondary | ICD-10-CM | POA: Diagnosis not present

## 2022-06-28 LAB — CBG MONITORING, ED: Glucose-Capillary: 125 mg/dL — ABNORMAL HIGH (ref 70–99)

## 2022-06-28 LAB — CBC
HCT: 39 % (ref 36.0–46.0)
Hemoglobin: 12.1 g/dL (ref 12.0–15.0)
MCH: 29.2 pg (ref 26.0–34.0)
MCHC: 31 g/dL (ref 30.0–36.0)
MCV: 94 fL (ref 80.0–100.0)
Platelets: 314 10*3/uL (ref 150–400)
RBC: 4.15 MIL/uL (ref 3.87–5.11)
RDW: 12.5 % (ref 11.5–15.5)
WBC: 11.6 10*3/uL — ABNORMAL HIGH (ref 4.0–10.5)
nRBC: 0 % (ref 0.0–0.2)

## 2022-06-28 LAB — BASIC METABOLIC PANEL
Anion gap: 9 (ref 5–15)
BUN: 16 mg/dL (ref 8–23)
CO2: 20 mmol/L — ABNORMAL LOW (ref 22–32)
Calcium: 8.4 mg/dL — ABNORMAL LOW (ref 8.9–10.3)
Chloride: 107 mmol/L (ref 98–111)
Creatinine, Ser: 1.05 mg/dL — ABNORMAL HIGH (ref 0.44–1.00)
GFR, Estimated: 56 mL/min — ABNORMAL LOW (ref >60)
Glucose, Bld: 128 mg/dL — ABNORMAL HIGH (ref 70–99)
Potassium: 3.4 mmol/L — ABNORMAL LOW (ref 3.5–5.1)
Sodium: 136 mmol/L (ref 135–145)

## 2022-06-28 LAB — SEDIMENTATION RATE: Sed Rate: 33 mm/hr — ABNORMAL HIGH (ref 0–22)

## 2022-06-28 MED ORDER — PROCHLORPERAZINE EDISYLATE 10 MG/2ML IJ SOLN
10.0000 mg | Freq: Once | INTRAMUSCULAR | Status: AC
  Administered 2022-06-28: 10 mg via INTRAVENOUS
  Filled 2022-06-28: qty 2

## 2022-06-28 MED ORDER — DIPHENHYDRAMINE HCL 50 MG/ML IJ SOLN
25.0000 mg | Freq: Once | INTRAMUSCULAR | Status: AC
  Administered 2022-06-28: 25 mg via INTRAVENOUS
  Filled 2022-06-28: qty 1

## 2022-06-28 MED ORDER — IOHEXOL 300 MG/ML  SOLN
100.0000 mL | Freq: Once | INTRAMUSCULAR | Status: AC | PRN
  Administered 2022-06-28: 75 mL via INTRAVENOUS

## 2022-06-28 NOTE — ED Provider Notes (Signed)
[facility] EMERGENCY DEPARTMENT Provider Note   CSN: [id number] Arrival date & time: 06/28/22  1516     History  Chief Complaint  Patient presents with   Dizziness    Megan Odom is a 76 y.o. female.  HPI     76 year old patient comes in with chief complaint of dizziness and headaches.  Patient states that she has been having headaches for the last few days.  Headaches are new for her.  The headaches are described as frontal, sharp and when they arrived they last for 2 or 3 days.  Headaches have been present for the last several weeks and sometimes she has pain over her gums with the headaches.  Patient has seen her PCP.  She was supposed to get CT head done today, but is awaiting clearance by insurance.  Patient also complains of dizziness.  Dizziness is intermittent, not directly associated with the headaches.  Dizziness described as feeling lightheaded, unsteady and having some spinning sensation.  Patient denies any double vision, loss of vision, slurred speech, difficulty in swallowing.    Home Medications Prior to Admission medications   Medication Sig Start Date End Date Taking? Authorizing Provider  albuterol (VENTOLIN HFA) 108 (90 Base) MCG/ACT inhaler Inhale 2 puffs into the lungs every 4 (four) hours as needed for wheezing or shortness of breath. 07/05/19   Estill Dooms, NP  aspirin EC 81 MG tablet Take 81 mg by mouth daily.      [provider]  cholecalciferol (VITAMIN D3) 25 MCG (1000 UNIT) tablet Take 1,000 Units by mouth daily.    [provider]  clotrimazole-betamethasone (LOTRISONE) cream Apply 1 application topically 2 (two) times daily.  07/10/19   [provider]  doxycycline (VIBRAMYCIN) 100 MG capsule Take 1 capsule (100 mg total) by mouth 2 (two) times daily. 02/08/21   Marcello Fennel, PA-C  DULoxetine (CYMBALTA) 60 MG capsule Take 60 mg by mouth daily.  07/28/19   [provider]  fluticasone  (FLONASE) 50 MCG/ACT nasal spray Place 2 sprays into the nose daily as needed for allergies.     [provider]  furosemide (LASIX) 40 MG tablet Take 40 mg by mouth daily.      [provider]  Gabapentin, Once-Daily, 300 & 600 MG MISC Take by mouth daily.    [provider]  HYDROcodone-acetaminophen (NORCO) 7.5-325 MG tablet Take 1-2 tablets by mouth in the morning and at bedtime.  08/09/19   [provider]  ipratropium-albuterol (DUONEB) 0.5-2.5 (3) MG/3ML SOLN Take 3 mLs by nebulization every 6 (six) hours as needed (shortness of breath or wheezing).  04/20/19   [provider]  ketoconazole (NIZORAL) 2 % cream Apply 1 application topically daily as needed for irritation.  08/08/19   [provider]  losartan (COZAAR) 100 MG tablet Take 100 mg by mouth daily.    [provider]  Naldemedine Tosylate (SYMPROIC) 0.2 MG TABS Take 1 tablet by mouth daily.    [provider]  Omega-3 Fatty Acids (FISH OIL) 1000 MG CAPS Take 1,000 mg by mouth daily.    [provider]  omeprazole (PRILOSEC) 40 MG capsule Take 40 mg by mouth daily.     [provider]  ondansetron (ZOFRAN) 4 MG tablet Take 1 tablet (4 mg total) by mouth every 8 (eight) hours as needed for nausea or vomiting. 04/09/20   Mahala Menghini, PA-C  potassium chloride (K-DUR) 10 MEQ tablet Take 40 mEq  by mouth daily.     [provider]  spironolactone (ALDACTONE) 25 MG tablet Take 25 mg by mouth daily.  10/16/18   [provider]  verapamil (CALAN-SR) 120 MG CR tablet Take 120 mg by mouth daily.  06/23/16   [provider]      Allergies    Darvocet [propoxyphene n-acetaminophen], Latex, Other, Penicillins, Tetracyclines & related, and Adhesive [tape]    Review of Systems   Review of Systems  All other systems reviewed and are negative.   Physical Exam Updated Vital Signs BP (!) 137/110   Pulse (!) 54   Temp 98.1 F  (36.7 C) (Oral)   Resp 20   Ht 5' 5.5" (1.664 m)   Wt 110.2 kg   SpO2 95%   BMI 39.82 kg/m  Physical Exam Vitals and nursing note reviewed.  Constitutional:      Appearance: She is well-developed.  HENT:     Head: Atraumatic.  Eyes:     Extraocular Movements: Extraocular movements intact.     Pupils: Pupils are equal, round, and reactive to light.     Comments: No nystagmus, peripheral visual field intact  Cardiovascular:     Rate and Rhythm: Normal rate.  Pulmonary:     Effort: Pulmonary effort is normal.  Musculoskeletal:     Cervical back: Normal range of motion and neck supple.  Skin:    General: Skin is warm and dry.  Neurological:     Mental Status: She is alert and oriented to person, place, and time.     Cranial Nerves: No cranial nerve deficit.     Sensory: No sensory deficit.     Motor: No weakness.     Coordination: Coordination normal.     Gait: Gait normal.     Comments: No dysmetria     ED Results / Procedures / Treatments   Labs (all labs ordered are listed, but only abnormal results are displayed) Labs Reviewed  BASIC METABOLIC PANEL - Abnormal; Notable for the following components:      Result Value   Potassium 3.4 (*)    CO2 20 (*)    Glucose, Bld 128 (*)    Creatinine, Ser 1.05 (*)    Calcium 8.4 (*)    GFR, Estimated 56 (*)    All other components within normal limits  CBC - Abnormal; Notable for the following components:   WBC 11.6 (*)    All other components within normal limits  SEDIMENTATION RATE - Abnormal; Notable for the following components:   Sed Rate 33 (*)    All other components within normal limits  CBG MONITORING, ED - Abnormal; Notable for the following components:   Glucose-Capillary 125 (*)    All other components within normal limits  URINALYSIS, ROUTINE W REFLEX MICROSCOPIC    EKG EKG Interpretation  Date/Time:  Tuesday June 28 2022 15:49:38 EST Ventricular Rate:  54 PR Interval:  202 QRS Duration: 112 QT  Interval:  444 QTC Calculation: 421 R Axis:   -12 Text Interpretation: Sinus bradycardia Left ventricular hypertrophy with repolarization abnormality ( R in aVL , Cornell product ) Abnormal ECG When compared with ECG of 11-May-2018 15:23, PREVIOUS ECG IS PRESENT No acute changes No significant change since last tracing Confirmed by Varney Biles [phone]) on 06/28/2022 6:35:17 PM  Radiology CT ANGIO HEAD NECK W WO CM  Result Date: 06/28/2022 CLINICAL DATA:  Headache and dizziness EXAM: CT ANGIOGRAPHY HEAD AND NECK TECHNIQUE: Multidetector CT imaging of  the head and neck was performed using the standard protocol during bolus administration of intravenous contrast. Multiplanar CT image reconstructions and MIPs were obtained to evaluate the vascular anatomy. Carotid stenosis measurements (when applicable) are obtained utilizing NASCET criteria, using the distal internal carotid diameter as the denominator. RADIATION DOSE REDUCTION: This exam was performed according to the departmental dose-optimization program which includes automated exposure control, adjustment of the mA and/or kV according to patient size and/or use of iterative reconstruction technique. CONTRAST:  10m OMNIPAQUE IOHEXOL 300 MG/ML  SOLN COMPARISON:  Prior CTA, correlation is made with CT head 10/05/2016 FINDINGS: CT HEAD FINDINGS Brain: No evidence of acute infarct, hemorrhage, mass, mass effect, or midline shift. No hydrocephalus or extra-axial fluid collection. Vascular: No hyperdense vessel. Skull: Redemonstrated patchy lucencies in the calvarium, which appear unchanged from 2018 and favored to be benign; prior myeloma workup was negative. No acute fracture. Hyperostosis frontalis. Sinuses/Orbits: No acute finding. Other: The mastoid air cells are well aerated. CTA NECK FINDINGS Aortic arch: Standard branching. Imaged portion shows no evidence of aneurysm or dissection. No significant stenosis of the major arch vessel origins. Aortic  atherosclerosis. Right carotid system: No evidence of dissection, occlusion, or hemodynamically significant stenosis (greater than 50%). Atherosclerotic disease at the bifurcation and in the proximal ICA is not hemodynamically significant. Left carotid system: No evidence of dissection, occlusion, or hemodynamically significant stenosis (greater than 50%). Atherosclerotic disease at the bifurcation and in the proximal ICA is not hemodynamically significant. Vertebral arteries: No evidence of dissection, occlusion, or hemodynamically significant stenosis (greater than 50%). Skeleton: No acute osseous abnormality. Degenerative changes in the cervical spine. Lucencies in the C2 vertebral body, similar to the calvarial lucencies, favored to be benign. Poor dentition. Other neck: Negative. Upper chest: Emphysema. No focal pulmonary opacity or pleural effusion. Review of the MIP images confirms the above findings CTA HEAD FINDINGS Anterior circulation: Both internal carotid arteries are patent to the termini, with moderate stenosis in the bilateral cavernous segments. A1 segments patent. Normal anterior communicating artery. Anterior cerebral arteries are patent to their distal aspects. No M1 stenosis or occlusion. MCA branches perfused and symmetric. Posterior circulation: Vertebral arteries patent to the vertebrobasilar junction with severe stenosis in the mid left V4. Posterior inferior cerebellar arteries patent proximally. Basilar patent to its distal aspect. Superior cerebellar arteries patent proximally. Patent P1 segments. PCAs perfused to their distal aspects without stenosis. The bilateral posterior communicating arteries are not visualized. Venous sinuses: As permitted by contrast timing, patent. Anatomic variants: None significant. Review of the MIP images confirms the above findings IMPRESSION: 1. No acute intracranial process. 2. No intracranial large vessel occlusion. Severe stenosis in the mid left V4.  Moderate stenosis in the bilateral cavernous ICAs. 3. No hemodynamically significant stenosis in the neck. 4. Aortic atherosclerosis and emphysema. Aortic Atherosclerosis (ICD10-I70.0) and Emphysema (ICD10-J43.9). Electronically Signed   By: AMerilyn BabaM.D.   On: 06/28/2022 20:24    Procedures Procedures    Medications Ordered in ED Medications  prochlorperazine (COMPAZINE) injection 10 mg (10 mg Intravenous Given 06/28/22 1932)  diphenhydrAMINE (BENADRYL) injection 25 mg (25 mg Intravenous Given 06/28/22 1932)  iohexol (OMNIPAQUE) 300 MG/ML solution 100 mL (75 mLs Intravenous Contrast Given 06/28/22 1951)    ED Course/ Medical Decision Making/ A&P Clinical Course as of 06/28/22 2319  Tue Jun 28, 2022  2318 Sedimentation rate(!) Sed rate less than 40.  Headache improved with headache cocktail.  Headache actually resolved.  Labs are reassuring.  No profound leukocytosis.  No new renal failure.  Results discussed with the patient. [AN]  2319 CT ANGIO HEAD NECK W WO CM CT angiogram reviewed.  Patient does not have any brain bleed.  CT scan of the head was interpreted independently from emergency diagnosis perspective.  No evidence of brain bleed.  Per radiology read, no evidence of dissection/aneurysm. [AN]    Clinical Course User Index [AN] Varney Biles, MD                           Medical Decision Making Amount and/or Complexity of Data Reviewed Labs: ordered. Radiology: ordered.  Risk Prescription drug management.   76 year old female comes in with chief complaint of headache and dizziness. Patient has history of hypertension, hyperlipidemia, liver cirrhosis.  She indicates that she has been having headaches for the last several weeks.  Dizziness is also intermittently associated with the headaches.  Today the dizziness is worse than usual.  Differential diagnosis considered includes cerebral venous thrombosis, dural venous thrombosis, temporal arteritis, brain tumor,  subdural hematoma, space-occupying lesion including meningioma. For dizziness, differential diagnosis includes stroke, orthostatic hypotension, dehydration, severe electrolyte abnormality.  Patient's neurologic exam is nonfocal.  No dysmetria, no nystagmus.  Given age over 4 with new headache, CT scan will be needed. Sed rate also ordered to evaluate for temporal arteritis, especially given that she indicates that she has some discomfort over her gum/jaw.  Final Clinical Impression(s) / ED Diagnoses Final diagnoses:  Acute nonintractable headache, unspecified headache type    Rx / DC Orders ED Discharge Orders     None         Varney Biles, MD 06/28/22 2319

## 2022-06-28 NOTE — ED Notes (Signed)
Pt states she does not want to ambulate, she states "her bones hurt"

## 2022-06-28 NOTE — ED Notes (Signed)
Patient transported to CT 

## 2022-06-28 NOTE — Discharge Instructions (Signed)
  All the results in the ER are normal, labs and imaging. We are not sure what is causing your symptoms. The workup in the ER is not complete, and is limited to screening for life threatening and emergent conditions only, so please see a primary care doctor for further evaluation.  Please return to the ER if your symptoms worsen; you have increased pain, fevers, chills, inability to keep any medications down, confusion. Otherwise see the outpatient doctor as requested.  

## 2022-06-28 NOTE — ED Notes (Signed)
Pt more lethargic than she was on arrival. Vital signs obtained. Pt taken to Hall[facility] [address].

## 2022-06-28 NOTE — ED Notes (Signed)
Pt asleep, not wanting to ambulate at this time

## 2022-06-28 NOTE — ED Triage Notes (Addendum)
Pt presents to ED with complaints of dizziness. Pt states worse today after she was stung by a bee. Pt states she was suppose to have a head CT today but they didn't have the order from Dr Legrand Rams. Pt states she has been having sharp pains in her head and dizziness x couple of months.

## 2022-07-08 ENCOUNTER — Ambulatory Visit (HOSPITAL_COMMUNITY)
Admission: RE | Admit: 2022-07-08 | Discharge: 2022-07-08 | Disposition: A | Discharge status: Home / Self Care | Payer: Medicare HMO | Source: Ambulatory Visit | Attending: Gerontology | Admitting: Gerontology

## 2022-07-08 DIAGNOSIS — E785 Hyperlipidemia, unspecified: Secondary | ICD-10-CM | POA: Diagnosis not present

## 2022-07-08 DIAGNOSIS — I6523 Occlusion and stenosis of bilateral carotid arteries: Secondary | ICD-10-CM | POA: Diagnosis not present

## 2022-07-08 DIAGNOSIS — I10 Essential (primary) hypertension: Secondary | ICD-10-CM | POA: Diagnosis not present

## 2022-07-08 DIAGNOSIS — R55 Syncope and collapse: Secondary | ICD-10-CM | POA: Diagnosis not present

## 2022-07-27 DIAGNOSIS — I10 Essential (primary) hypertension: Secondary | ICD-10-CM | POA: Diagnosis not present

## 2022-07-27 DIAGNOSIS — J449 Chronic obstructive pulmonary disease, unspecified: Secondary | ICD-10-CM | POA: Diagnosis not present

## 2022-08-27 DIAGNOSIS — I10 Essential (primary) hypertension: Secondary | ICD-10-CM | POA: Diagnosis not present

## 2022-08-27 DIAGNOSIS — F331 Major depressive disorder, recurrent, moderate: Secondary | ICD-10-CM | POA: Diagnosis not present

## 2022-08-27 DIAGNOSIS — R69 Illness, unspecified: Secondary | ICD-10-CM | POA: Diagnosis not present

## 2022-09-27 DIAGNOSIS — R69 Illness, unspecified: Secondary | ICD-10-CM | POA: Diagnosis not present

## 2022-09-27 DIAGNOSIS — I10 Essential (primary) hypertension: Secondary | ICD-10-CM | POA: Diagnosis not present

## 2022-09-27 DIAGNOSIS — F331 Major depressive disorder, recurrent, moderate: Secondary | ICD-10-CM | POA: Diagnosis not present

## 2022-10-20 ENCOUNTER — Encounter: Payer: Self-pay | Admitting: Radiology

## 2022-10-26 DIAGNOSIS — I10 Essential (primary) hypertension: Secondary | ICD-10-CM | POA: Diagnosis not present

## 2022-10-26 DIAGNOSIS — F331 Major depressive disorder, recurrent, moderate: Secondary | ICD-10-CM | POA: Diagnosis not present

## 2022-10-26 DIAGNOSIS — R69 Illness, unspecified: Secondary | ICD-10-CM | POA: Diagnosis not present

## 2022-11-26 DIAGNOSIS — R69 Illness, unspecified: Secondary | ICD-10-CM | POA: Diagnosis not present

## 2022-11-26 DIAGNOSIS — I10 Essential (primary) hypertension: Secondary | ICD-10-CM | POA: Diagnosis not present

## 2022-11-26 DIAGNOSIS — E785 Hyperlipidemia, unspecified: Secondary | ICD-10-CM | POA: Diagnosis not present

## 2022-11-26 DIAGNOSIS — F3341 Major depressive disorder, recurrent, in partial remission: Secondary | ICD-10-CM | POA: Diagnosis not present

## 2022-11-26 DIAGNOSIS — Z880 Allergy status to penicillin: Secondary | ICD-10-CM | POA: Diagnosis not present

## 2022-11-26 DIAGNOSIS — J449 Chronic obstructive pulmonary disease, unspecified: Secondary | ICD-10-CM | POA: Diagnosis not present

## 2022-11-26 DIAGNOSIS — F331 Major depressive disorder, recurrent, moderate: Secondary | ICD-10-CM | POA: Diagnosis not present

## 2022-11-26 DIAGNOSIS — G629 Polyneuropathy, unspecified: Secondary | ICD-10-CM | POA: Diagnosis not present

## 2022-11-26 DIAGNOSIS — I509 Heart failure, unspecified: Secondary | ICD-10-CM | POA: Diagnosis not present

## 2022-11-26 DIAGNOSIS — M545 Low back pain, unspecified: Secondary | ICD-10-CM | POA: Diagnosis not present

## 2022-11-26 DIAGNOSIS — Z8249 Family history of ischemic heart disease and other diseases of the circulatory system: Secondary | ICD-10-CM | POA: Diagnosis not present

## 2022-11-26 DIAGNOSIS — K219 Gastro-esophageal reflux disease without esophagitis: Secondary | ICD-10-CM | POA: Diagnosis not present

## 2022-11-26 DIAGNOSIS — M1990 Unspecified osteoarthritis, unspecified site: Secondary | ICD-10-CM | POA: Diagnosis not present

## 2022-11-26 DIAGNOSIS — Z008 Encounter for other general examination: Secondary | ICD-10-CM | POA: Diagnosis not present

## 2022-11-26 DIAGNOSIS — I110 Hypertensive heart disease with heart failure: Secondary | ICD-10-CM | POA: Diagnosis not present

## 2022-12-23 DIAGNOSIS — I10 Essential (primary) hypertension: Secondary | ICD-10-CM | POA: Diagnosis not present

## 2022-12-23 DIAGNOSIS — Z0001 Encounter for general adult medical examination with abnormal findings: Secondary | ICD-10-CM | POA: Diagnosis not present

## 2022-12-23 DIAGNOSIS — Z1389 Encounter for screening for other disorder: Secondary | ICD-10-CM | POA: Diagnosis not present

## 2022-12-23 DIAGNOSIS — M170 Bilateral primary osteoarthritis of knee: Secondary | ICD-10-CM | POA: Diagnosis not present

## 2022-12-23 DIAGNOSIS — Z1331 Encounter for screening for depression: Secondary | ICD-10-CM | POA: Diagnosis not present

## 2022-12-23 DIAGNOSIS — F331 Major depressive disorder, recurrent, moderate: Secondary | ICD-10-CM | POA: Diagnosis not present

## 2022-12-23 DIAGNOSIS — F17200 Nicotine dependence, unspecified, uncomplicated: Secondary | ICD-10-CM | POA: Diagnosis not present

## 2022-12-23 DIAGNOSIS — J439 Emphysema, unspecified: Secondary | ICD-10-CM | POA: Diagnosis not present

## 2022-12-23 DIAGNOSIS — I700 Atherosclerosis of aorta: Secondary | ICD-10-CM | POA: Diagnosis not present

## 2022-12-23 DIAGNOSIS — J449 Chronic obstructive pulmonary disease, unspecified: Secondary | ICD-10-CM | POA: Diagnosis not present

## 2022-12-23 DIAGNOSIS — F17210 Nicotine dependence, cigarettes, uncomplicated: Secondary | ICD-10-CM | POA: Diagnosis not present

## 2022-12-29 ENCOUNTER — Ambulatory Visit
Admission: RE | Admit: 2022-12-29 | Discharge: 2022-12-29 | Disposition: A | Discharge status: Home / Self Care | Payer: Medicare HMO | Source: Ambulatory Visit | Attending: Internal Medicine | Admitting: Internal Medicine

## 2022-12-29 ENCOUNTER — Other Ambulatory Visit: Payer: Self-pay | Admitting: Internal Medicine

## 2022-12-29 DIAGNOSIS — I700 Atherosclerosis of aorta: Secondary | ICD-10-CM | POA: Diagnosis not present

## 2022-12-29 DIAGNOSIS — R059 Cough, unspecified: Secondary | ICD-10-CM | POA: Insufficient documentation

## 2022-12-29 DIAGNOSIS — E7849 Other hyperlipidemia: Secondary | ICD-10-CM | POA: Diagnosis not present

## 2022-12-29 DIAGNOSIS — I6602 Occlusion and stenosis of left middle cerebral artery: Secondary | ICD-10-CM | POA: Diagnosis not present

## 2022-12-29 DIAGNOSIS — R7989 Other specified abnormal findings of blood chemistry: Secondary | ICD-10-CM | POA: Diagnosis not present

## 2023-01-12 NOTE — Congregational Nurse Program (Signed)
  Dept: [phone]   Congregational Nurse Program Note  Date of Encounter: 01/12/2023 Client to [facility] center for healing touch and vital sign check. 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 BP (!) 160/70 (BP Location: Left Arm, Patient Position: Sitting, Cuff Size: Large)   Pulse (!) 59   SpO2 96%  Client reports she takes losartan, spironolactone and verapamil.  Irregular heart rate noted on auscultation, lungs clear. Client is aware she has an irregular heart rate. She reported having "welts" that come up on her legs and arms, randomly that itch. She has been prescribed "itch cream and benadryl". The benadryl makes her sleepy. There was no aggravating factor identified with RN questions. She reports she has been seen regularly at her Md office, Dr. Felecia Shelling. RN offered to keep a regular check on her blood pressure with return visits to [facility]. Madolyn Frieze Past Medical History: Past Medical History:  Diagnosis Date   Arthritis    Asthma    Cirrhosis (HCC)    Depression    Hep C w/o coma, chronic (HCC)    eradicated with Harvoni 2015.    Hyperlipemia    Hypertension     Encounter Details:  CNP Questionnaire - 01/12/23 1326       Questionnaire   Ask client: Do you give verbal consent for me to treat you today? Yes    Student Assistance N/A    Location Patient Served  [facility]    Visit Setting with Client Organization    Patient Status Unknown   client lives in [place]   [place]    Insurance/Financial Assistance Referral N/A    Medication N/A   client has insurance coverage for medications   Medical Provider Yes   Dr. Felecia Shelling in [place]   Screening Referrals Made N/A    Medical Referrals Made N/A    Medical Appointment Made N/A    Recently w/o PCP, now 1st time PCP visit completed due to CNs referral or appointment made N/A    Food N/A    Transportation N/A   has her own vehicle    Housing/Utilities N/A    Interpersonal Safety N/A    Interventions Advocate/Support;Educate    Abnormal to Normal Screening Since Last CN Visit N/A    Screenings CN Performed Blood Pressure;Pulse Ox    Sent Client to Lab for: N/A    Did client attend any of the following based off CNs referral or appointments made? N/A    ED Visit Averted N/A    Life-Saving Intervention Made N/A

## 2023-01-19 NOTE — Congregational Nurse Program (Signed)
  Dept: [phone]   Congregational Nurse Program Note  Date of Encounter: 01/19/2023 Client to [facility] with complaints or severe itching. She reports she continues to have raised welts in areas that come up when she itches. Rn was able to see said areas.   Client has been unable to show these areas to her MD because they "are never there then". With client permission, RN used client's phone to take pictures of the raised welts.Both areas were on the right side of her chest, near her bra line. RN encouraged client to contact her doctor for an appointment and with the pictures on her phone he may be able to offer her some treatment. She has been using anti itch cream or powder. This does happen daily. She reports no new detergent, soap or body lotion, no new medication. RN offered support and again encouraged her to call her PCP. Francesco Runner BSN, RN. Past Medical History: Past Medical History:  Diagnosis Date   Arthritis    Asthma    Cirrhosis (HCC)    Depression    Hep C w/o coma, chronic (HCC)    eradicated with Harvoni 2015.    Hyperlipemia    Hypertension     Encounter Details:  CNP Questionnaire - 01/19/23 1112       Questionnaire   Ask client: Do you give verbal consent for me to treat you today? Yes    Student Assistance N/A    Location Patient Served  [facility]    Visit Setting with Client Organization    Patient Status Unknown   client lives in [place]   [place]    Insurance/Financial Assistance Referral N/A    Medication N/A   client has insurance coverage for medications   Medical Provider Yes   Dr. Felecia Shelling in [place]   Screening Referrals Made N/A    Medical Referrals Made N/A    Medical Appointment Made N/A    Recently w/o PCP, now 1st time PCP visit completed due to CNs referral or appointment made N/A    Food N/A    Transportation N/A   has her own vehicle   Housing/Utilities N/A    Interpersonal Safety N/A    Interventions  Advocate/Support;Educate    Abnormal to Normal Screening Since Last CN Visit N/A    Sent Client to Lab for: N/A    Did client attend any of the following based off CNs referral or appointments made? N/A    ED Visit Averted N/A    Life-Saving Intervention Made N/A

## 2023-01-23 DIAGNOSIS — I10 Essential (primary) hypertension: Secondary | ICD-10-CM | POA: Diagnosis not present

## 2023-01-23 DIAGNOSIS — F331 Major depressive disorder, recurrent, moderate: Secondary | ICD-10-CM | POA: Diagnosis not present

## 2023-02-09 ENCOUNTER — Ambulatory Visit
Admission: RE | Admit: 2023-02-09 | Discharge: 2023-02-09 | Disposition: A | Discharge status: Home / Self Care | Payer: Medicare HMO | Source: Ambulatory Visit | Attending: Physician Assistant | Admitting: Physician Assistant

## 2023-02-09 ENCOUNTER — Other Ambulatory Visit: Payer: Self-pay | Admitting: Physician Assistant

## 2023-02-09 DIAGNOSIS — M79605 Pain in left leg: Secondary | ICD-10-CM | POA: Insufficient documentation

## 2023-02-09 DIAGNOSIS — M7989 Other specified soft tissue disorders: Secondary | ICD-10-CM | POA: Diagnosis not present

## 2023-02-22 DIAGNOSIS — F331 Major depressive disorder, recurrent, moderate: Secondary | ICD-10-CM | POA: Diagnosis not present

## 2023-02-22 DIAGNOSIS — I10 Essential (primary) hypertension: Secondary | ICD-10-CM | POA: Diagnosis not present

## 2023-03-25 DIAGNOSIS — I10 Essential (primary) hypertension: Secondary | ICD-10-CM | POA: Diagnosis not present

## 2023-03-25 DIAGNOSIS — F331 Major depressive disorder, recurrent, moderate: Secondary | ICD-10-CM | POA: Diagnosis not present

## 2023-04-13 NOTE — Congregational Nurse Program (Signed)
  Dept: [phone]   Congregational Nurse Program Note  Date of Encounter: 04/13/2023 Client to [facility] day center with request for blood pressure check. She had not taken her medications as of this visit. BP 162/78. She reports her PCP wants the "top" number to be lower. She does see him regularly. 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 Encouraged her to take her medications when she returns to the group home this morning. Francesco Runner BSN, RN Past Medical History: Past Medical History:  Diagnosis Date   Arthritis    Asthma    Cirrhosis (HCC)    Depression    Hep C w/o coma, chronic (HCC)    eradicated with Harvoni 2015.    Hyperlipemia    Hypertension     Encounter Details:  CNP Questionnaire - 04/13/23 0940       Questionnaire   Ask client: Do you give verbal consent for me to treat you today? Yes    Student Assistance N/A    Location Patient Served  [facility]    Visit Setting with Client Organization    Patient Status Unknown   client lives in [place]   [place]    Insurance/Financial Assistance Referral N/A    Medication N/A   client has insurance coverage for medications   Medical Provider Yes   Dr. Felecia Shelling in [place]   Screening Referrals Made N/A    Medical Referrals Made N/A    Medical Appointment Made N/A    Recently w/o PCP, now 1st time PCP visit completed due to CNs referral or appointment made N/A    Food N/A    Transportation N/A   has her own vehicle   Housing/Utilities N/A    Interpersonal Safety N/A    Interventions Advocate/Support;Educate    Abnormal to Normal Screening Since Last CN Visit N/A    Screenings CN Performed Blood Pressure    Sent Client to Lab for: N/A    Did client attend any of the following based off CNs referral or appointments made? N/A    ED Visit Averted N/A    Life-Saving Intervention Made N/A

## 2023-04-20 NOTE — Congregational Nurse Program (Signed)
  Dept: [phone]   Congregational Nurse Program Note  Date of Encounter: 04/20/2023 Client into nurse only clinic requesting BP check. Acknowledges extra stress and taking several meds for sinuses acting up. Reviewed OTC meds taken. Taking BP meds as prescribed. BP 164/76. Discussed hydration and limiting salt intake. Follow up prn. Rhermann, RN Past Medical History: Past Medical History:  Diagnosis Date   Arthritis    Asthma    Cirrhosis (HCC)    Depression    Hep C w/o coma, chronic (HCC)    eradicated with Harvoni 2015.    Hyperlipemia    Hypertension     Encounter Details:  CNP Questionnaire - 04/20/23 1030       Questionnaire   Ask client: Do you give verbal consent for me to treat you today? Yes    Student Assistance N/A    Location Patient Served  [facility]    Visit Setting with Client Organization    Patient Status Unknown   client lives in [place]   [place]    Insurance/Financial Assistance Referral N/A    Medication N/A   client has insurance coverage for medications   Medical Provider Yes   Dr. Felecia Shelling in [place]   Screening Referrals Made N/A    Medical Referrals Made N/A    Medical Appointment Made N/A    Recently w/o PCP, now 1st time PCP visit completed due to CNs referral or appointment made N/A    Food N/A    Transportation N/A   has her own vehicle   Housing/Utilities N/A    Interpersonal Safety N/A    Interventions Advocate/Support;Educate    Abnormal to Normal Screening Since Last CN Visit N/A    Screenings CN Performed Blood Pressure    Sent Client to Lab for: N/A    Did client attend any of the following based off CNs referral or appointments made? N/A    ED Visit Averted N/A    Life-Saving Intervention Made N/A

## 2023-04-25 DIAGNOSIS — I10 Essential (primary) hypertension: Secondary | ICD-10-CM | POA: Diagnosis not present

## 2023-04-25 DIAGNOSIS — F331 Major depressive disorder, recurrent, moderate: Secondary | ICD-10-CM | POA: Diagnosis not present

## 2023-05-03 NOTE — Congregational Nurse Program (Signed)
  Dept: [phone]   Congregational Nurse Program Note  Date of Encounter: 05/03/2023 Client to [facility] day center with request for blood pressure check. BP (!) 166/92 (BP Location: Left Arm, Patient Position: Sitting, Cuff Size: Large)   Pulse (!) 57 Comment: patient reports this is her norm  SpO2 96% . Client reports she had not taken her BP medications at time of visit. 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 Client voiced understanding. Francesco Runner BSN, RN  Past Medical History: Past Medical History:  Diagnosis Date   Arthritis    Asthma    Cirrhosis (HCC)    Depression    Hep C w/o coma, chronic (HCC)    eradicated with Harvoni 2015.    Hyperlipemia    Hypertension     Encounter Details:  CNP Questionnaire - 05/03/23 0925       Questionnaire   Ask client: Do you give verbal consent for me to treat you today? Yes    Student Assistance N/A    Location Patient Served  [facility]    Visit Setting with Client Organization    Patient Status Unknown   client lives in [place]   [place]    Insurance/Financial Assistance Referral N/A    Medication N/A   client has insurance coverage for medications   Medical Provider Yes   Dr. Felecia Shelling in [place]   Screening Referrals Made N/A    Medical Referrals Made N/A    Medical Appointment Made N/A    Recently w/o PCP, now 1st time PCP visit completed due to CNs referral or appointment made N/A    Food N/A    Transportation N/A   has her own vehicle   Housing/Utilities N/A    Interpersonal Safety N/A    Interventions Advocate/Support;Educate    Abnormal to Normal Screening Since Last CN Visit N/A    Screenings CN Performed Blood Pressure;Pulse Ox    Sent Client to Lab for: N/A    Did client attend any of the following based off CNs referral or appointments made? N/A    ED Visit Averted N/A    Life-Saving Intervention Made N/A

## 2023-05-11 NOTE — Congregational Nurse Program (Signed)
  Dept: [phone]   Congregational Nurse Program Note  Date of Encounter: 05/11/2023 Client to [facility] day center with request for vital sign check. BP (!) 162/68 (BP Location: Left Arm, Patient Position: Sitting, Cuff Size: Large)   Pulse (!) 59   SpO2 95% . Client reports that is normal for her. RN inquired about when her next apt was with her PCP, she reports next month. Encouraged client to attend that apt. She does check her blood pressure on her own at home. Francesco Runner BSN, RN  Past Medical History: Past Medical History:  Diagnosis Date   Arthritis    Asthma    Cirrhosis (HCC)    Depression    Hep C w/o coma, chronic (HCC)    eradicated with Harvoni 2015.    Hyperlipemia    Hypertension     Encounter Details:  CNP Questionnaire - 05/11/23 1353       Questionnaire   Ask client: Do you give verbal consent for me to treat you today? Yes    Student Assistance N/A    Location Patient Served  [facility]    Visit Setting with Client Organization    Patient Status Unknown   client lives in [place]   [place]    Insurance/Financial Assistance Referral N/A    Medication N/A   client has insurance coverage for medications   Medical Provider Yes   Dr. Felecia Shelling in [place]   Screening Referrals Made N/A    Medical Referrals Made N/A    Medical Appointment Made N/A    Recently w/o PCP, now 1st time PCP visit completed due to CNs referral or appointment made N/A    Food N/A    Transportation N/A   has her own vehicle   Housing/Utilities N/A    Interpersonal Safety N/A    Interventions Advocate/Support;Educate    Abnormal to Normal Screening Since Last CN Visit N/A    Screenings CN Performed Blood Pressure;Pulse Ox    Sent Client to Lab for: N/A    Did client attend any of the following based off CNs referral or appointments made? N/A    ED Visit Averted N/A    Life-Saving Intervention Made N/A

## 2023-05-25 DIAGNOSIS — F331 Major depressive disorder, recurrent, moderate: Secondary | ICD-10-CM | POA: Diagnosis not present

## 2023-05-25 DIAGNOSIS — I10 Essential (primary) hypertension: Secondary | ICD-10-CM | POA: Diagnosis not present

## 2023-05-31 NOTE — Congregational Nurse Program (Signed)
  Dept: [phone]   Congregational Nurse Program Note  Date of Encounter: 05/31/2023 Client to [facility] day center with request for blood pressure check. BP (!) 142/70 (BP Location: Left Arm, Patient Position: Sitting, Cuff Size: Large)   Pulse (!) 55   SpO2 94% . She reports she has taken her BP medication today. Her pulse is chronically low and states her PCP, Dr. Felecia Shelling is aware. She requested assistance in finding out where her previous pain management MD, who's office closed in December 2024, sent her records. Advised client to contact her PCP office to see if they could also assist. She was able to speak with the receptionist, who will assist. Francesco Runner BSN, RN  Past Medical History: Past Medical History:  Diagnosis Date   Arthritis    Asthma    Cirrhosis (HCC)    Depression    Hep C w/o coma, chronic (HCC)    eradicated with Harvoni 2015.    Hyperlipemia    Hypertension     Encounter Details:  CNP Questionnaire - 05/31/23 0937       Questionnaire   Ask client: Do you give verbal consent for me to treat you today? Yes    Student Assistance N/A    Location Patient Served  [facility]    Visit Setting with Client Organization    Patient Status Unknown   client lives in [place]   [place]    Insurance/Financial Assistance Referral N/A    Medication N/A   client has insurance coverage for medications   Medical Provider Yes   Dr. Felecia Shelling in [place]   Screening Referrals Made N/A    Medical Referrals Made N/A    Medical Appointment Made N/A    Recently w/o PCP, now 1st time PCP visit completed due to CNs referral or appointment made N/A    Food N/A    Transportation N/A   has her own vehicle   Housing/Utilities N/A    Interpersonal Safety N/A    Interventions Advocate/Support;Educate;Navigate Healthcare System   assisted in finding medical records   Abnormal to Normal Screening Since Last CN Visit N/A    Screenings CN Performed Blood  Pressure;Pulse Ox    Sent Client to Lab for: N/A    Did client attend any of the following based off CNs referral or appointments made? N/A    ED Visit Averted N/A    Life-Saving Intervention Made N/A

## 2023-06-01 NOTE — Congregational Nurse Program (Signed)
  Dept: [phone]   Congregational Nurse Program Note  Date of Encounter: 06/01/2023 Client to [facility] day center with request for vital sign check. BP (!) 158/64 (BP Location: Left Arm, Patient Position: Sitting, Cuff Size: Large)   Pulse (!) 53   SpO2 96%. Client also requested assistance in resetting her MyChart account. RN was unsuccessful at this. Client continues with elevated blood pressure and low pulse rate. RN again strongly encouraged client to contact her PCP, Dr. Felecia Shelling for an appointment.  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  She has her own car and home in [place]. Rn to continue to encourage and assist as allowed with directing client to her PCP. Francesco Runner BSN, RN  Past Medical History: Past Medical History:  Diagnosis Date   Arthritis    Asthma    Cirrhosis (HCC)    Depression    Hep C w/o coma, chronic (HCC)    eradicated with Harvoni 2015.    Hyperlipemia    Hypertension     Encounter Details:  CNP Questionnaire - 06/01/23 1045       Questionnaire   Ask client: Do you give verbal consent for me to treat you today? Yes    Student Assistance N/A    Location Patient Served  [facility]    Visit Setting with Client Organization    Patient Status Unknown   client lives in [place]   [place]    Insurance/Financial Assistance Referral N/A    Medication N/A   client has insurance coverage for medications   Medical Provider Yes   Dr. Felecia Shelling in [place]   Screening Referrals Made N/A    Medical Referrals Made N/A    Medical Appointment Made N/A    Recently w/o PCP, now 1st time PCP visit completed due to CNs referral or appointment made N/A    Food N/A    Transportation N/A   has her own vehicle   Housing/Utilities N/A    Interpersonal Safety N/A    Interventions Advocate/Support;Educate   attempted to assist client with resetting her Mychart, it was unsuccessful    Abnormal to Normal Screening Since Last CN Visit N/A    Screenings CN Performed Blood Pressure;Pulse Ox    Sent Client to Lab for: N/A    Did client attend any of the following based off CNs referral or appointments made? N/A    ED Visit Averted N/A    Life-Saving Intervention Made N/A

## 2023-06-06 NOTE — Congregational Nurse Program (Signed)
  Dept: [phone]   Congregational Nurse Program Note  Date of Encounter: 06/06/2023 Client to [facility] day center with request for blood pressure check. BP (!) 162/68 (BP Location: Left Arm, Patient Position: Sitting, Cuff Size: Large)   Pulse (!) 53   SpO2 96% . RN again stressed the importance of client contacting her PCP about her blood pressure medications. She voiced understanding and stated she would contact her PCP on her next day off, which is later this week. Client also has identified the pain clinic that she was referred to. She only had the phone number and did not know the name or location. RN assisted in providing that information based on the phone number client had. Rn also set the name and address up in her phone contacts.  [facility] is where she was referred. She is currently waiting on a return call with an appointment. This appointment is in reference to her chronic back pain. Client appreciative of assistance. Madolyn Frieze  Past Medical History: Past Medical History:  Diagnosis Date   Arthritis    Asthma    Cirrhosis (HCC)    Depression    Hep C w/o coma, chronic (HCC)    eradicated with Harvoni 2015.    Hyperlipemia    Hypertension     Encounter Details:  Community Questionnaire - 06/06/23 0925       Questionnaire   Ask client: Do you give verbal consent for me to treat you today? Yes    Student Assistance N/A    Location Patient Served  [facility]    Population Status Unknown   client lives in [place]   [place]    Insurance/Financial Assistance Referral N/A    Medication N/A   client has insurance coverage for medications   Medical Provider Yes   Dr. Felecia Shelling in [place]   Screening Referrals Made N/A    Medical Referrals Made N/A    Medical Appointment Completed N/A    CNP Interventions Advocate/Support;Educate;Navigate Healthcare System   attempted to assist client with resetting her Mychart, it was  unsuccessful   Screenings CN Performed Blood Pressure    ED Visit Averted N/A    Life-Saving Intervention Made N/A      Questionnaire   Housing/Utilities N/A

## 2023-06-22 NOTE — Congregational Nurse Program (Signed)
  Dept: [phone]   Congregational Nurse Program Note  Date of Encounter: 06/22/2023 Client to [facility] day center for blood pressure check. Client reported that she had not yet made an appointment with her PCP. RN has been strongly encouraging her to contact her PCP as her blood pressure remains elevated. She denied any head ache, reports "strange dreams". RN again strongly encouraged her to contact her PCP for  for a medication review. She again said she would do this when she was off tomorrow. Emotional support and encouragement given. Francesco Runner BNS, RN Past Medical History: Past Medical History:  Diagnosis Date   Arthritis    Asthma    Cirrhosis (HCC)    Depression    Hep C w/o coma, chronic (HCC)    eradicated with Harvoni 2015.    Hyperlipemia    Hypertension     Encounter Details:  Community Questionnaire - 06/22/23 1030       Questionnaire   Ask client: Do you give verbal consent for me to treat you today? Yes    Student Assistance N/A    Location Patient Served  [facility]    Encounter Setting CN site    Population Status Unknown   client lives in [place]   [place]    Insurance/Financial Assistance Referral N/A    Medication N/A   client has insurance coverage for medications   Medical Provider Yes   Dr. Felecia Shelling in [place]   Screening Referrals Made N/A    Medical Referrals Made Non-[facility] PCP/Clinic    Medical Appointment Completed N/A    CNP Interventions Advocate/Support;Educate   attempted to assist client with resetting her Mychart, it was unsuccessful   Screenings CN Performed Blood Pressure    ED Visit Averted N/A    Life-Saving Intervention Made N/A      Questionnaire   Housing/Utilities N/A

## 2023-07-07 DIAGNOSIS — Z23 Encounter for immunization: Secondary | ICD-10-CM | POA: Diagnosis not present

## 2023-07-07 DIAGNOSIS — M170 Bilateral primary osteoarthritis of knee: Secondary | ICD-10-CM | POA: Diagnosis not present

## 2023-07-07 DIAGNOSIS — I10 Essential (primary) hypertension: Secondary | ICD-10-CM | POA: Diagnosis not present

## 2023-07-07 DIAGNOSIS — F331 Major depressive disorder, recurrent, moderate: Secondary | ICD-10-CM | POA: Diagnosis not present

## 2023-07-13 NOTE — Congregational Nurse Program (Signed)
  Dept: [phone]   Congregational Nurse Program Note  Date of Encounter: 07/13/2023 Client to [facility] day center with request for blood pressure check. Blood pressure remains elevated. BP (!) 162/70 (BP Location: Left Arm, Patient Position: Sitting, Cuff Size: Large)   Pulse (!) 58   SpO2 97%  Client reports she has seen her PCP and was given a new prescription for her  "itching". She has not yet picked it up and does not know the name of the medication. She stated that her blood pressure "was good at her apt. RN to continue to provide education regarding the importance of communication with her PCP. Madolyn Frieze  Past Medical History: Past Medical History:  Diagnosis Date   Arthritis    Asthma    Cirrhosis (HCC)    Depression    Hep C w/o coma, chronic (HCC)    eradicated with Harvoni 2015.    Hyperlipemia    Hypertension     Encounter Details:  Community Questionnaire - 07/13/23 1146       Questionnaire   Ask client: Do you give verbal consent for me to treat you today? Yes    Student Assistance N/A    Location Patient Served  [facility]    Encounter Setting CN site    Population Status Unknown   client lives in [place]   [place]    Insurance/Financial Assistance Referral N/A    Medication N/A   client has insurance coverage for medications   Medical Provider Yes   Dr. Felecia Shelling in [place]   Screening Referrals Made N/A    Medical Referrals Made Non-[facility] PCP/Clinic    Medical Appointment Completed N/A    CNP Interventions Advocate/Support;Educate   attempted to assist client with resetting her Mychart, it was unsuccessful   Screenings CN Performed Blood Pressure    ED Visit Averted N/A    Life-Saving Intervention Made N/A      Questionnaire   Housing/Utilities N/A

## 2023-08-01 DIAGNOSIS — Z0001 Encounter for general adult medical examination with abnormal findings: Secondary | ICD-10-CM | POA: Diagnosis not present

## 2023-08-01 DIAGNOSIS — F331 Major depressive disorder, recurrent, moderate: Secondary | ICD-10-CM | POA: Diagnosis not present

## 2023-08-01 DIAGNOSIS — I700 Atherosclerosis of aorta: Secondary | ICD-10-CM | POA: Diagnosis not present

## 2023-08-01 DIAGNOSIS — I10 Essential (primary) hypertension: Secondary | ICD-10-CM | POA: Diagnosis not present

## 2023-08-03 NOTE — Congregational Nurse Program (Signed)
  Dept: [phone]   Congregational Nurse Program Note  Date of Encounter: 08/03/2023 Client to [facility] day center with request for blood pressure check. She reports she has recently been seen by her PCP and has had no medication changes. BP (!) 140/62 (BP Location: Left Arm, Patient Position: Sitting, Cuff Size: Large)   Pulse (!) 59   SpO2 100% . She also report she has an upcoming apt at the pain clinic on Dec.17. Francesco Runner BSN, RN  Past Medical History: Past Medical History:  Diagnosis Date   Arthritis    Asthma    Cirrhosis (HCC)    Depression    Hep C w/o coma, chronic (HCC)    eradicated with Harvoni 2015.    Hyperlipemia    Hypertension     Encounter Details:  Community Questionnaire - 08/03/23 1234       Questionnaire   Ask client: Do you give verbal consent for me to treat you today? Yes    Student Assistance N/A    Location Patient Served  [facility]    Encounter Setting CN site    Population Status Unknown   client lives in [place]   [place]    Insurance/Financial Assistance Referral N/A    Medication N/A   client has insurance coverage for medications   Medical Provider Yes   Dr. Felecia Shelling in [place]   Screening Referrals Made N/A    Medical Referrals Made Non-[facility] PCP/Clinic    Medical Appointment Completed N/A    CNP Interventions Advocate/Support;Educate   attempted to assist client with resetting her Mychart, it was unsuccessful   Screenings CN Performed Blood Pressure    ED Visit Averted N/A    Life-Saving Intervention Made N/A      Questionnaire   Housing/Utilities N/A

## 2023-08-06 DIAGNOSIS — F331 Major depressive disorder, recurrent, moderate: Secondary | ICD-10-CM | POA: Diagnosis not present

## 2023-08-06 DIAGNOSIS — I10 Essential (primary) hypertension: Secondary | ICD-10-CM | POA: Diagnosis not present

## 2023-08-10 NOTE — Congregational Nurse Program (Signed)
  Dept: [phone]   Congregational Nurse Program Note  Date of Encounter: 08/10/2023 Client to [facility] day center for blood pressure check. BP remains elevated at 170/82. Client reports she is current with her blood pressure medications and with her PCP visits.  Megan Odom Past Medical History: Past Medical History:  Diagnosis Date   Arthritis    Asthma    Cirrhosis (HCC)    Depression    Hep C w/o coma, chronic (HCC)    eradicated with Harvoni 2015.    Hyperlipemia    Hypertension     Encounter Details:  Community Questionnaire - 08/10/23 1100       Questionnaire   Ask client: Do you give verbal consent for me to treat you today? Yes    Student Assistance N/A    Location Patient Served  [facility]    Encounter Setting CN site    Population Status Unknown   client lives in [place]   [place]    Insurance/Financial Assistance Referral N/A    Medication N/A   client has insurance coverage for medications   Medical Provider Yes   Dr. Felecia Shelling in [place]   Screening Referrals Made N/A    Medical Referrals Made Non-[facility] PCP/Clinic    Medical Appointment Completed N/A    CNP Interventions Advocate/Support;Educate   attempted to assist client with resetting her Mychart, it was unsuccessful   Screenings CN Performed Blood Pressure    ED Visit Averted N/A    Life-Saving Intervention Made N/A      Questionnaire   Housing/Utilities N/A

## 2023-09-04 ENCOUNTER — Other Ambulatory Visit: Payer: Self-pay

## 2023-09-04 ENCOUNTER — Emergency Department (HOSPITAL_COMMUNITY)
Admission: EM | Admit: 2023-09-04 | Discharge: 2023-09-05 | Disposition: A | Discharge status: Home / Self Care | Payer: No Typology Code available for payment source | Attending: Emergency Medicine | Admitting: Emergency Medicine

## 2023-09-04 ENCOUNTER — Emergency Department (HOSPITAL_COMMUNITY): Payer: No Typology Code available for payment source

## 2023-09-04 ENCOUNTER — Encounter (HOSPITAL_COMMUNITY): Payer: Self-pay | Admitting: Emergency Medicine

## 2023-09-04 DIAGNOSIS — I10 Essential (primary) hypertension: Secondary | ICD-10-CM | POA: Insufficient documentation

## 2023-09-04 DIAGNOSIS — I129 Hypertensive chronic kidney disease with stage 1 through stage 4 chronic kidney disease, or unspecified chronic kidney disease: Secondary | ICD-10-CM | POA: Diagnosis not present

## 2023-09-04 DIAGNOSIS — J42 Unspecified chronic bronchitis: Secondary | ICD-10-CM | POA: Diagnosis not present

## 2023-09-04 DIAGNOSIS — Z79899 Other long term (current) drug therapy: Secondary | ICD-10-CM | POA: Insufficient documentation

## 2023-09-04 DIAGNOSIS — J439 Emphysema, unspecified: Secondary | ICD-10-CM | POA: Diagnosis not present

## 2023-09-04 DIAGNOSIS — R0602 Shortness of breath: Secondary | ICD-10-CM | POA: Diagnosis not present

## 2023-09-04 DIAGNOSIS — Z7982 Long term (current) use of aspirin: Secondary | ICD-10-CM | POA: Diagnosis not present

## 2023-09-04 DIAGNOSIS — J45909 Unspecified asthma, uncomplicated: Secondary | ICD-10-CM | POA: Diagnosis not present

## 2023-09-04 DIAGNOSIS — Z7951 Long term (current) use of inhaled steroids: Secondary | ICD-10-CM | POA: Diagnosis not present

## 2023-09-04 DIAGNOSIS — J40 Bronchitis, not specified as acute or chronic: Secondary | ICD-10-CM | POA: Diagnosis not present

## 2023-09-04 DIAGNOSIS — N179 Acute kidney failure, unspecified: Secondary | ICD-10-CM | POA: Insufficient documentation

## 2023-09-04 DIAGNOSIS — I700 Atherosclerosis of aorta: Secondary | ICD-10-CM | POA: Diagnosis not present

## 2023-09-04 DIAGNOSIS — N189 Chronic kidney disease, unspecified: Secondary | ICD-10-CM | POA: Diagnosis not present

## 2023-09-04 DIAGNOSIS — Z720 Tobacco use: Secondary | ICD-10-CM | POA: Diagnosis not present

## 2023-09-04 DIAGNOSIS — Z91040 Latex allergy status: Secondary | ICD-10-CM | POA: Insufficient documentation

## 2023-09-04 DIAGNOSIS — Z20822 Contact with and (suspected) exposure to covid-19: Secondary | ICD-10-CM | POA: Diagnosis not present

## 2023-09-04 LAB — BASIC METABOLIC PANEL
Anion gap: 8 (ref 5–15)
BUN: 36 mg/dL — ABNORMAL HIGH (ref 8–23)
CO2: 19 mmol/L — ABNORMAL LOW (ref 22–32)
Calcium: 9.2 mg/dL (ref 8.9–10.3)
Chloride: 106 mmol/L (ref 98–111)
Creatinine, Ser: 1.72 mg/dL — ABNORMAL HIGH (ref 0.44–1.00)
GFR, Estimated: 31 mL/min — ABNORMAL LOW (ref >60)
Glucose, Bld: 98 mg/dL (ref 70–99)
Potassium: 5.1 mmol/L (ref 3.5–5.1)
Sodium: 133 mmol/L — ABNORMAL LOW (ref 135–145)

## 2023-09-04 LAB — URINALYSIS, ROUTINE W REFLEX MICROSCOPIC
Bilirubin Urine: NEGATIVE
Glucose, UA: NEGATIVE mg/dL
Hgb urine dipstick: NEGATIVE
Ketones, ur: NEGATIVE mg/dL
Nitrite: NEGATIVE
Protein, ur: NEGATIVE mg/dL
Specific Gravity, Urine: 1.017 (ref 1.005–1.030)
pH: 5 (ref 5.0–8.0)

## 2023-09-04 LAB — CBC
HCT: 41.7 % (ref 36.0–46.0)
Hemoglobin: 12.7 g/dL (ref 12.0–15.0)
MCH: 28.2 pg (ref 26.0–34.0)
MCHC: 30.5 g/dL (ref 30.0–36.0)
MCV: 92.5 fL (ref 80.0–100.0)
Platelets: 392 10*3/uL (ref 150–400)
RBC: 4.51 MIL/uL (ref 3.87–5.11)
RDW: 13 % (ref 11.5–15.5)
WBC: 13.9 10*3/uL — ABNORMAL HIGH (ref 4.0–10.5)
nRBC: 0 % (ref 0.0–0.2)

## 2023-09-04 LAB — RESP PANEL BY RT-PCR (RSV, FLU A&B, COVID)  RVPGX2
Influenza A by PCR: NEGATIVE
Influenza B by PCR: NEGATIVE
Resp Syncytial Virus by PCR: NEGATIVE
SARS Coronavirus 2 by RT PCR: NEGATIVE

## 2023-09-04 MED ORDER — ALBUTEROL SULFATE HFA 108 (90 BASE) MCG/ACT IN AERS
2.0000 | INHALATION_SPRAY | RESPIRATORY_TRACT | Status: DC | PRN
  Administered 2023-09-04: 2 via RESPIRATORY_TRACT
  Filled 2023-09-04: qty 6.7

## 2023-09-04 MED ORDER — AZITHROMYCIN 250 MG PO TABS
500.0000 mg | ORAL_TABLET | ORAL | Status: AC
  Administered 2023-09-04: 500 mg via ORAL
  Filled 2023-09-04: qty 2

## 2023-09-04 MED ORDER — LIDOCAINE 5 % EX PTCH
1.0000 | MEDICATED_PATCH | CUTANEOUS | Status: DC
  Administered 2023-09-04: 1 via TRANSDERMAL
  Filled 2023-09-04: qty 1

## 2023-09-04 MED ORDER — AZITHROMYCIN 250 MG PO TABS: 250.0000 mg | ORAL_TABLET | Freq: Every day | ORAL | 0 refills | Status: DC

## 2023-09-04 MED ORDER — SODIUM CHLORIDE 0.9 % IV BOLUS: 500.0000 mL | Freq: Once | INTRAVENOUS | Status: DC

## 2023-09-04 MED ORDER — PREDNISONE 20 MG PO TABS: 40.0000 mg | ORAL_TABLET | Freq: Every day | ORAL | 0 refills | Status: AC

## 2023-09-04 MED ORDER — PREDNISONE 50 MG PO TABS
60.0000 mg | ORAL_TABLET | Freq: Once | ORAL | Status: AC
  Administered 2023-09-04: 60 mg via ORAL
  Filled 2023-09-04: qty 1

## 2023-09-04 MED ORDER — SODIUM CHLORIDE 0.9 % IV BOLUS
500.0000 mL | Freq: Once | INTRAVENOUS | Status: AC
  Administered 2023-09-04: 500 mL via INTRAVENOUS

## 2023-09-04 MED ORDER — IPRATROPIUM-ALBUTEROL 0.5-2.5 (3) MG/3ML IN SOLN
3.0000 mL | Freq: Once | RESPIRATORY_TRACT | Status: AC
  Administered 2023-09-04: 3 mL via RESPIRATORY_TRACT
  Filled 2023-09-04: qty 3

## 2023-09-04 MED ORDER — ACETAMINOPHEN 500 MG PO TABS
1000.0000 mg | ORAL_TABLET | Freq: Once | ORAL | Status: AC
  Administered 2023-09-04: 1000 mg via ORAL
  Filled 2023-09-04: qty 2

## 2023-09-04 NOTE — ED Triage Notes (Signed)
 Pt endorses SOB since taking flu shot a few weeks ago. Endorses pain to right arm and hip with trouble moving. Chills for 3 days.

## 2023-09-04 NOTE — ED Notes (Signed)
 Pt ambulated to the bathroom in [place]. Asked her to give [place] a urine sample.

## 2023-09-04 NOTE — ED Provider Notes (Signed)
 [place] EMERGENCY DEPARTMENT AT [facility] Provider Note   CSN: [id number] Arrival date & time: 09/04/23  1447     History  Chief Complaint  Patient presents with   Shortness of Breath    Megan Odom is a 78 y.o. female.  78 year old female with a history of asthma, tobacco use, hepatitis C treated with Harvoni and hypertension who presents emergency department with shortness of breath and a cough.  Says that she had a flu shot about a month ago and has been having some shortness of breath and cough.  Says that occasionally is productive.  Also has been having some chills.  No fevers.  Has myalgias as well.  Denies any leg swelling.  No chest pain.       Home Medications Prior to Admission medications   Medication Sig Start Date End Date Taking? Authorizing Provider  albuterol  (VENTOLIN  HFA) 108 (90 Base) MCG/ACT inhaler Inhale 2 puffs into the lungs every 4 (four) hours as needed for wheezing or shortness of breath. 07/05/19  Yes Signa Nest A, NP  aspirin  EC 81 MG tablet Take 81 mg by mouth daily.     Yes [provider]  azithromycin  (ZITHROMAX ) 250 MG tablet Take 1 tablet (250 mg total) by mouth daily. Take first 2 tablets together, then 1 every day until finished. 09/04/23  Yes Yolande Lamar BROCKS, MD  cholecalciferol  (VITAMIN D3) 25 MCG (1000 UNIT) tablet Take 1,000 Units by mouth daily.   Yes [provider]  DULoxetine  (CYMBALTA ) 60 MG capsule Take 60 mg by mouth daily.  07/28/19  Yes [provider]  fluticasone  (FLONASE ) 50 MCG/ACT nasal spray Place 2 sprays into the nose daily as needed for allergies.    Yes [provider]  gabapentin  (NEURONTIN ) 300 MG capsule Take 300 mg by mouth at bedtime. 08/24/23  Yes [provider]  hydrOXYzine  (ATARAX ) 10 MG tablet Take 10 mg by mouth every 8 (eight) hours as needed for itching. 07/07/23  Yes [provider]  ipratropium-albuterol  (DUONEB) 0.5-2.5 (3)  MG/3ML SOLN Take 3 mLs by nebulization every 6 (six) hours as needed (shortness of breath or wheezing).  04/20/19  Yes [provider]  KLOR-CON  M20 20 MEQ tablet Take 20 mEq by mouth daily. 08/24/23  Yes [provider]  losartan -hydrochlorothiazide  (HYZAAR) 100-25 MG tablet Take 1 tablet by mouth daily.   Yes [provider]  Omega-3 Fatty Acids (FISH OIL) 1000 MG CAPS Take 1,000 mg by mouth daily.   Yes [provider]  omeprazole (PRILOSEC) 40 MG capsule Take 40 mg by mouth daily.    Yes [provider]  predniSONE  (DELTASONE ) 20 MG tablet Take 2 tablets (40 mg total) by mouth daily for 4 days. 09/04/23 09/08/23 Yes Yolande Lamar BROCKS, MD  spironolactone  (ALDACTONE ) 25 MG tablet Take 25 mg by mouth daily.  10/16/18  Yes [provider]  TRELEGY ELLIPTA 100-62.5-25 MCG/ACT AEPB Inhale 1 puff into the lungs daily. 08/27/23  Yes [provider]  verapamil  (CALAN ) 120 MG tablet Take 120 mg by mouth daily. 07/21/23  Yes [provider]      Allergies    Darvocet [propoxyphene n-acetaminophen ], Latex, Other, Penicillins, Tetracyclines & related, and Adhesive [tape]    Review of Systems   Review of Systems  Physical Exam Updated Vital Signs BP 136/71   Pulse (!) 57   Temp 98 F (36.7 C) (Oral)   Resp 12   Ht 5' 5.5 (1.664 m)  Wt 110 kg   SpO2 95%   BMI 39.74 kg/m  Physical Exam Vitals and nursing note reviewed.  Constitutional:      General: She is not in acute distress.    Appearance: She is well-developed.  HENT:     Head: Normocephalic and atraumatic.     Right Ear: External ear normal.     Left Ear: External ear normal.     Nose: Nose normal.  Eyes:     Extraocular Movements: Extraocular movements intact.     Conjunctiva/sclera: Conjunctivae normal.     Pupils: Pupils are equal, round, and reactive to light.  Cardiovascular:     Rate and Rhythm: Normal rate and regular rhythm.     Heart sounds: No murmur  heard. Pulmonary:     Effort: Pulmonary effort is normal. No respiratory distress.     Breath sounds: Wheezing (Faint expiratory) present.  Musculoskeletal:     Cervical back: Normal range of motion and neck supple.     Right lower leg: No edema.     Left lower leg: No edema.  Skin:    General: Skin is warm and dry.  Neurological:     Mental Status: She is alert and oriented to person, place, and time. Mental status is at baseline.  Psychiatric:        Mood and Affect: Mood normal.     ED Results / Procedures / Treatments   Labs (all labs ordered are listed, but only abnormal results are displayed) Labs Reviewed  CBC - Abnormal; Notable for the following components:      Result Value   WBC 13.9 (*)    All other components within normal limits  BASIC METABOLIC PANEL - Abnormal; Notable for the following components:   Sodium 133 (*)    CO2 19 (*)    BUN 36 (*)    Creatinine, Ser 1.72 (*)    GFR, Estimated 31 (*)    All other components within normal limits  URINALYSIS, ROUTINE W REFLEX MICROSCOPIC - Abnormal; Notable for the following components:   Leukocytes,Ua SMALL (*)    Bacteria, UA RARE (*)    All other components within normal limits  RESP PANEL BY RT-PCR (RSV, FLU A&B, COVID)  [id number]    EKG EKG Interpretation Date/Time:  Monday September 04 2023 17:51:20 EST Ventricular Rate:  41 PR Interval:  196 QRS Duration:  93 QT Interval:  482 QTC Calculation: 398 R Axis:   7  Text Interpretation: Sinus bradycardia Atrial premature complex Probable anteroseptal infarct, old ST elevation, consider inferior injury No acute changes Confirmed by Charlyn Sora [phone]) on 09/05/2023 9:52:42 AM  Radiology DG Chest 2 View Result Date: 09/04/2023 CLINICAL DATA:  Shortness of breath. EXAM: CHEST - 2 VIEW COMPARISON:  12/29/2022, CT 10/25/2019 FINDINGS: Moderate emphysema. Normal heart size with stable mediastinal contours. Aortic atherosclerosis. No focal airspace disease,  pleural effusion, or pneumothorax. On limited assessment, no acute osseous findings. IMPRESSION: Moderate emphysema.  No superimposed acute findings. Electronically Signed   By: Andrea Gasman M.D.   On: 09/04/2023 16:40    Procedures Procedures    Medications Ordered in ED Medications  ipratropium-albuterol  (DUONEB) 0.5-2.5 (3) MG/3ML nebulizer solution 3 mL (3 mLs Nebulization Given 09/04/23 1946)  predniSONE  (DELTASONE ) tablet 60 mg (60 mg Oral Given 09/04/23 2001)  azithromycin  (ZITHROMAX ) tablet 500 mg (500 mg Oral Given 09/04/23 2001)  acetaminophen  (TYLENOL ) tablet 1,000 mg (1,000 mg Oral Given 09/04/23 2205)  sodium chloride  0.9 % bolus  500 mL (0 mLs Intravenous Stopped 09/05/23 0001)    ED Course/ Medical Decision Making/ A&P                                 Medical Decision Making Amount and/or Complexity of Data Reviewed Labs: ordered. Radiology: ordered.  Risk OTC drugs. Prescription drug management.   Megan Odom is a 78 y.o. female with comorbidities that complicate the patient evaluation including asthma, tobacco use, hepatitis C treated with Harvoni and hypertension who presents emergency department with shortness of breath and a cough.   Initial Ddx:  COPD exacerbation, URI, pneumonia, PE  MDM:  Feel the patient likely has a COPD exacerbation based on their symptoms.  Will obtain a COVID and flu to assess for URI.  Will also obtain a chest x-ray to evaluate for pneumonia or any other causes of their shortness of breath.  Given their findings on lung auscultation feel that the cause is likely due to obstructive airway disease rather than a pulmonary embolism.  Will give antibiotics based on the increased sputum production and dyspnea.  Plan:  Labs Chest x-ray COVID/flu Steroids Nebs Antibiotics  ED Summary/Re-evaluation:  Chest x-ray without pneumonia.  Labs show that she has mild worsening of her renal function.  Was given IV fluid bolus for this and  suspect this is prerenal in the setting of her illness.  COVID and flu are negative.  On reevaluation satting well on room air and is not in any acute distress.  Discharged home with steroids and antibiotics.  Also instructed to take her rescue inhaler for any shortness of breath that she may have.  This patient presents to the ED for concern of complaints listed in HPI, this involves an extensive number of treatment options, and is a complaint that carries with it a high risk of complications and morbidity. Disposition including potential need for admission considered.   Dispo: DC Home. Return precautions discussed including, but not limited to, those listed in the AVS. Allowed pt time to ask questions which were answered fully prior to dc.  Records reviewed Outpatient Clinic Notes The following labs were independently interpreted: Chemistry and show AKI I independently reviewed the following imaging with scope of interpretation limited to determining acute life threatening conditions related to emergency care: Chest x-ray and agree with the radiologist interpretation with the following exceptions: none I personally reviewed and interpreted cardiac monitoring: normal sinus rhythm  I personally reviewed and interpreted the pt's EKG: see above for interpretation  I have reviewed the patients home medications and made adjustments as needed Social Determinants of health:  Elderly   Final Clinical Impression(s) / ED Diagnoses Final diagnoses:  Chronic bronchitis, unspecified chronic bronchitis type (HCC)  AKI (acute kidney injury) (HCC)    Rx / DC Orders ED Discharge Orders          Ordered    azithromycin  (ZITHROMAX ) 250 MG tablet  Daily        09/04/23 2153    predniSONE  (DELTASONE ) 20 MG tablet  Daily        09/04/23 2153              Yolande Lamar BROCKS, MD 09/06/23 0010

## 2023-09-04 NOTE — Discharge Instructions (Addendum)
 You were seen for your bronchitis in the emergency department.  You also were found to have signs of dehydration (kidney injury).  At home, please stay well-hydrated.  Take the antibiotics (azithromycin ) and steroid (prednisone ) to be prescribed you.    Check your MyChart online for the results of any tests that had not resulted by the time you left the emergency department.   Follow-up with your primary doctor in 2-3 days regarding your visit.    Return immediately to the emergency department if you experience any of the following: Difficulty breathing, or any other concerning symptoms.    Thank you for visiting our Emergency Department. It was a pleasure taking care of you today.

## 2023-09-04 NOTE — ED Provider Triage Note (Signed)
 Emergency Medicine Provider Triage Evaluation Note  Megan Odom , a 78 y.o. female  was evaluated in triage.  Pt complains of sob.  Review of Systems  Positive: Intermittent sob Negative: fever  Physical Exam  BP (!) 117/97 (BP Location: Right Arm)   Pulse (!) 44   Temp 98.3 F (36.8 C) (Oral)   Resp 16   Ht 5' 5.5 (1.664 m)   Wt 110 kg   SpO2 94%   BMI 39.74 kg/m  Gen:   Awake, no distress   Resp:  Normal effort  MSK:   Moves extremities without difficulty No LE edema Other:    Medical Decision Making  Medically screening exam initiated at 3:18 PM.  Appropriate orders placed.  Megan Odom was informed that the remainder of the evaluation will be completed by another provider, this initial triage assessment does not replace that evaluation, and the importance of remaining in the ED until their evaluation is complete.  C/O worsening symptoms that feel like asthma x 1 week. No fever. Also reports right arm pain x 2 weeks Also reports right hip pain without fall x 1 week Continuous smoker   Odell Balls, PA-C 09/04/23 1520

## 2023-09-05 NOTE — Congregational Nurse Program (Signed)
  Dept: [phone]   Congregational Nurse Program Note  Date of Encounter: 09/05/2023 Client to [facility] day center, nurse led clinic for vital sign assessment and to report she was seen overnight in the [facility]. She was diagnosed with bronchitis and prescribed prednisone  and a Z-pack. She is working locally for the next 11 days and her prescriptions were sent to her pharmacy in [place]. RN provided education and assistance in getting the prescriptions transferred to a local pharmacy. RN also assisted client in contacting her PCP office to make a follow up apt. 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 Megan Odom Past Medical History: Past Medical History:  Diagnosis Date   Arthritis    Asthma    Cirrhosis (HCC)    Depression    Hep C w/o coma, chronic (HCC)    eradicated with Harvoni 2015.    Hyperlipemia    Hypertension     Encounter Details:  Community Questionnaire - 09/05/23 1000       Questionnaire   Ask client: Do you give verbal consent for me to treat you today? Yes    Student Assistance N/A    Location Patient Served  [facility]    Encounter Setting CN site    Population Status Unknown   client lives in [place]   [place]    Insurance/Financial Assistance Referral N/A    Medication N/A   client has insurance coverage for medications   Medical Provider Yes   Dr. Carlette in [place]   Screening Referrals Made N/A    Medical Referrals Made N/A    Medical Appointment Completed N/A    CNP Interventions Advocate/Support;Educate   attempted to assist client with resetting her Mychart, it was unsuccessful   Screenings CN Performed Blood Pressure    ED Visit Averted N/A    Life-Saving Intervention Made N/A

## 2023-09-06 DIAGNOSIS — I10 Essential (primary) hypertension: Secondary | ICD-10-CM | POA: Diagnosis not present

## 2023-09-06 DIAGNOSIS — F331 Major depressive disorder, recurrent, moderate: Secondary | ICD-10-CM | POA: Diagnosis not present

## 2023-09-14 DIAGNOSIS — F17200 Nicotine dependence, unspecified, uncomplicated: Secondary | ICD-10-CM | POA: Diagnosis not present

## 2023-09-14 DIAGNOSIS — J441 Chronic obstructive pulmonary disease with (acute) exacerbation: Secondary | ICD-10-CM | POA: Diagnosis not present

## 2023-09-14 DIAGNOSIS — I10 Essential (primary) hypertension: Secondary | ICD-10-CM | POA: Diagnosis not present

## 2023-09-14 DIAGNOSIS — N179 Acute kidney failure, unspecified: Secondary | ICD-10-CM | POA: Diagnosis not present

## 2023-09-15 ENCOUNTER — Other Ambulatory Visit (HOSPITAL_COMMUNITY): Payer: Self-pay | Admitting: Gerontology

## 2023-09-15 ENCOUNTER — Ambulatory Visit (HOSPITAL_COMMUNITY)
Admission: RE | Admit: 2023-09-15 | Discharge: 2023-09-15 | Disposition: A | Discharge status: Home / Self Care | Payer: No Typology Code available for payment source | Source: Ambulatory Visit | Attending: Gerontology | Admitting: Gerontology

## 2023-09-15 DIAGNOSIS — R918 Other nonspecific abnormal finding of lung field: Secondary | ICD-10-CM | POA: Diagnosis not present

## 2023-09-15 DIAGNOSIS — R059 Cough, unspecified: Secondary | ICD-10-CM | POA: Insufficient documentation

## 2023-09-15 DIAGNOSIS — N179 Acute kidney failure, unspecified: Secondary | ICD-10-CM | POA: Diagnosis not present

## 2023-09-15 DIAGNOSIS — I700 Atherosclerosis of aorta: Secondary | ICD-10-CM | POA: Diagnosis not present

## 2023-09-15 DIAGNOSIS — J439 Emphysema, unspecified: Secondary | ICD-10-CM | POA: Diagnosis not present

## 2023-09-15 DIAGNOSIS — I10 Essential (primary) hypertension: Secondary | ICD-10-CM | POA: Diagnosis not present

## 2023-09-21 NOTE — Congregational Nurse Program (Signed)
  Dept: [phone]   Congregational Nurse Program Note  Date of Encounter: 09/21/2023 Client to [facility] day center nurse led clinic for vital sign check. She reports having gone to [facility] ER. Per chart note review she was seen on 1/13 and diagnosed with bronchitis and AKI. She has seen her PCP since then and there were no medication changes. Her BP remains elevated and pulse low. We discussed  some diet changes, namely drinking more water and less soda as well as watching her sodium intake. Emotional support given. Madolyn Frieze Past Medical History: Past Medical History:  Diagnosis Date   Arthritis    Asthma    Cirrhosis (HCC)    Depression    Hep C w/o coma, chronic (HCC)    eradicated with Harvoni 2015.    Hyperlipemia    Hypertension     Encounter Details:  Community Questionnaire - 09/21/23 0934       Questionnaire   Ask client: Do you give verbal consent for me to treat you today? Yes    Student Assistance N/A    Location Patient Served  [facility]    Encounter Setting CN site    Population Status Unknown   client lives in [place]   [place]    Insurance/Financial Assistance Referral N/A    Medication N/A   client has insurance coverage for medications   Medical Provider Yes   Dr. Felecia Shelling in [place]   Screening Referrals Made N/A    Medical Referrals Made N/A    Medical Appointment Completed N/A    CNP Interventions Advocate/Support;Educate   attempted to assist client with resetting her Mychart, it was unsuccessful   Screenings CN Performed Blood Pressure    ED Visit Averted N/A    Life-Saving Intervention Made N/A

## 2023-10-04 NOTE — Congregational Nurse Program (Signed)
  Dept: [phone]   Congregational Nurse Program Note  Date of Encounter: 10/04/2023 Client to [facility] day center nurse led clinic for blood pressure check. She reports having been to her PCP recently and has been on antibiotics for a urinary tract or kidney infection. She reports taking her medications as directed. Megan Odom Past Medical History: Past Medical History:  Diagnosis Date   Arthritis    Asthma    Cirrhosis (HCC)    Depression    Hep C w/o coma, chronic (HCC)    eradicated with Harvoni 2015.    Hyperlipemia    Hypertension     Encounter Details:  Community Questionnaire - 10/04/23 1125       Questionnaire   Ask client: Do you give verbal consent for me to treat you today? Yes    Student Assistance N/A    Location Patient Served  [facility]    Encounter Setting CN site    Population Status Unknown   client lives in [place]   [place]    Insurance/Financial Assistance Referral N/A    Medication N/A   client has insurance coverage for medications   Medical Provider Yes   Dr. Felecia Shelling in [place]   Screening Referrals Made N/A    Medical Referrals Made N/A    Medical Appointment Completed N/A    CNP Interventions Advocate/Support;Educate   attempted to assist client with resetting her Mychart, it was unsuccessful   Screenings CN Performed Blood Pressure    ED Visit Averted N/A    Life-Saving Intervention Made N/A

## 2023-10-16 DIAGNOSIS — F331 Major depressive disorder, recurrent, moderate: Secondary | ICD-10-CM | POA: Diagnosis not present

## 2023-10-16 DIAGNOSIS — I10 Essential (primary) hypertension: Secondary | ICD-10-CM | POA: Diagnosis not present

## 2023-10-27 NOTE — Congregational Nurse Program (Signed)
  Dept: [phone]   Congregational Nurse Program Note  Date of Encounter: 10/27/2023 Client to [facility] day center nurse led clinic with request for blood pressure check.BP (!) 164/82 (BP Location: Left Arm, Patient Position: Sitting, Cuff Size: Large)   Pulse 66   SpO2 98%. Client reports she has taken her medications as prescribed earlier this morning. She has finished all her antibiotics that were prescribed for her upper respiratory infection. RN continues to provide education regarding the importance of blood pressure control. No other needs at this time. Francesco Runner BSN, RN  Past Medical History: Past Medical History:  Diagnosis Date   Arthritis    Asthma    Cirrhosis (HCC)    Depression    Hep C w/o coma, chronic (HCC)    eradicated with Harvoni 2015.    Hyperlipemia    Hypertension     Encounter Details:  Community Questionnaire - 10/26/23 1100       Questionnaire   Ask client: Do you give verbal consent for me to treat you today? Yes    Student Assistance N/A    Location Patient Served  [facility]    Encounter Setting CN site    Population Status Unknown   client lives in [place]   [place]    Insurance/Financial Assistance Referral N/A    Medication N/A   client has insurance coverage for medications   Medical Provider Yes   Dr. Felecia Shelling in [place]   Screening Referrals Made N/A    Medical Referrals Made N/A    Medical Appointment Completed N/A    CNP Interventions Advocate/Support;Educate   attempted to assist client with resetting her Mychart, it was unsuccessful   Screenings CN Performed Blood Pressure    ED Visit Averted N/A    Life-Saving Intervention Made N/A

## 2023-11-13 DIAGNOSIS — F331 Major depressive disorder, recurrent, moderate: Secondary | ICD-10-CM | POA: Diagnosis not present

## 2023-11-13 DIAGNOSIS — I10 Essential (primary) hypertension: Secondary | ICD-10-CM | POA: Diagnosis not present

## 2023-11-23 NOTE — Congregational Nurse Program (Signed)
  Dept: [phone]   Congregational Nurse Program Note  Date of Encounter: 11/23/2023 Client to [facility] day center with request for Blood pressure check. BP (!) 158/78 (BP Location: Left Arm, Patient Position: Sitting, Cuff Size: Large)   Pulse 62   SpO2 99%. Client reports she does have an upcoming apt with her PCP next month. RN encouraged her to seek an earlier apt. As her blood pressure remains elevated. She also has an upcoming apt at the Pain clinic in [place]. Client has several stressors in her life between family and her job. Emotional support provided. Madolyn Frieze  Past Medical History: Past Medical History:  Diagnosis Date   Arthritis    Asthma    Cirrhosis (HCC)    Depression    Hep C w/o coma, chronic (HCC)    eradicated with Harvoni 2015.    Hyperlipemia    Hypertension     Encounter Details:  Community Questionnaire - 11/23/23 1246       Questionnaire   Ask client: Do you give verbal consent for me to treat you today? Yes    Student Assistance N/A    Location Patient Served  [facility]    Encounter Setting CN site    Population Status Unknown   client lives in [place]   [place]    Insurance/Financial Assistance Referral N/A    Medication N/A   client has insurance coverage for medications   Medical Provider Yes   Dr. Felecia Shelling in [place]   Screening Referrals Made N/A    Medical Referrals Made N/A    Medical Appointment Completed N/A    CNP Interventions Advocate/Support;Educate   attempted to assist client with resetting her Mychart, it was unsuccessful   Screenings CN Performed Blood Pressure    ED Visit Averted N/A    Life-Saving Intervention Made N/A

## 2023-12-14 DIAGNOSIS — F331 Major depressive disorder, recurrent, moderate: Secondary | ICD-10-CM | POA: Diagnosis not present

## 2023-12-14 DIAGNOSIS — I10 Essential (primary) hypertension: Secondary | ICD-10-CM | POA: Diagnosis not present

## 2023-12-14 NOTE — Congregational Nurse Program (Signed)
  Dept: [phone]   Congregational Nurse Program Note  Date of Encounter: 12/14/2023 Client to [facility] day center, nurse led clinic with request for blood pressure check. BP 152/80, P 50. Client continues with elevated BP and low heart rate. RN has discussed with her several times, she reports her Md is aware of her vitals and that she is taking her medications as prescribed. She related that she is under increased stress d/t family matters in her home. Emotional support provided. Client has an upcoming apt at a pain clinic in [place] on 4/29. Juvenal Opoka BSN, RN Past Medical History: Past Medical History:  Diagnosis Date   Arthritis    Asthma    Cirrhosis (HCC)    Depression    Hep C w/o coma, chronic (HCC)    eradicated with Harvoni 2015.    Hyperlipemia    Hypertension     Encounter Details:  Community Questionnaire - 12/14/23 1003       Questionnaire   Ask client: Do you give verbal consent for me to treat you today? Yes    Student Assistance N/A    Location Patient Served  [facility]    Encounter Setting CN site    Population Status Unknown   client lives in [place]   [place]    Insurance/Financial Assistance Referral N/A    Medication N/A   client has insurance coverage for medications   Medical Provider Yes   Dr. Eldon Greenland in [place]   Screening Referrals Made N/A    Medical Referrals Made N/A    Medical Appointment Completed N/A    CNP Interventions Advocate/Support;Educate   attempted to assist client with resetting her Mychart, it was unsuccessful   Screenings CN Performed Blood Pressure    ED Visit Averted N/A    Life-Saving Intervention Made N/A

## 2023-12-19 DIAGNOSIS — M791 Myalgia, unspecified site: Secondary | ICD-10-CM | POA: Diagnosis not present

## 2023-12-19 DIAGNOSIS — M5416 Radiculopathy, lumbar region: Secondary | ICD-10-CM | POA: Diagnosis not present

## 2023-12-19 DIAGNOSIS — M7551 Bursitis of right shoulder: Secondary | ICD-10-CM | POA: Diagnosis not present

## 2023-12-19 DIAGNOSIS — M47816 Spondylosis without myelopathy or radiculopathy, lumbar region: Secondary | ICD-10-CM | POA: Diagnosis not present

## 2023-12-19 DIAGNOSIS — G894 Chronic pain syndrome: Secondary | ICD-10-CM | POA: Diagnosis not present

## 2023-12-19 DIAGNOSIS — Z79891 Long term (current) use of opiate analgesic: Secondary | ICD-10-CM | POA: Diagnosis not present

## 2023-12-26 ENCOUNTER — Other Ambulatory Visit (HOSPITAL_COMMUNITY): Payer: Self-pay | Admitting: Internal Medicine

## 2023-12-26 DIAGNOSIS — I700 Atherosclerosis of aorta: Secondary | ICD-10-CM | POA: Diagnosis not present

## 2023-12-26 DIAGNOSIS — F17200 Nicotine dependence, unspecified, uncomplicated: Secondary | ICD-10-CM | POA: Diagnosis not present

## 2023-12-26 DIAGNOSIS — I10 Essential (primary) hypertension: Secondary | ICD-10-CM | POA: Diagnosis not present

## 2023-12-26 DIAGNOSIS — Z0001 Encounter for general adult medical examination with abnormal findings: Secondary | ICD-10-CM | POA: Diagnosis not present

## 2023-12-26 DIAGNOSIS — Z1389 Encounter for screening for other disorder: Secondary | ICD-10-CM | POA: Diagnosis not present

## 2023-12-26 DIAGNOSIS — Z1231 Encounter for screening mammogram for malignant neoplasm of breast: Secondary | ICD-10-CM

## 2023-12-26 DIAGNOSIS — J449 Chronic obstructive pulmonary disease, unspecified: Secondary | ICD-10-CM | POA: Diagnosis not present

## 2023-12-26 DIAGNOSIS — M170 Bilateral primary osteoarthritis of knee: Secondary | ICD-10-CM | POA: Diagnosis not present

## 2023-12-26 DIAGNOSIS — Z1331 Encounter for screening for depression: Secondary | ICD-10-CM | POA: Diagnosis not present

## 2023-12-26 DIAGNOSIS — F331 Major depressive disorder, recurrent, moderate: Secondary | ICD-10-CM | POA: Diagnosis not present

## 2023-12-26 DIAGNOSIS — F17210 Nicotine dependence, cigarettes, uncomplicated: Secondary | ICD-10-CM | POA: Diagnosis not present

## 2023-12-28 DIAGNOSIS — I10 Essential (primary) hypertension: Secondary | ICD-10-CM | POA: Diagnosis not present

## 2023-12-28 DIAGNOSIS — N179 Acute kidney failure, unspecified: Secondary | ICD-10-CM | POA: Diagnosis not present

## 2023-12-28 DIAGNOSIS — Z131 Encounter for screening for diabetes mellitus: Secondary | ICD-10-CM | POA: Diagnosis not present

## 2023-12-28 DIAGNOSIS — R7989 Other specified abnormal findings of blood chemistry: Secondary | ICD-10-CM | POA: Diagnosis not present

## 2024-01-12 ENCOUNTER — Ambulatory Visit (HOSPITAL_COMMUNITY)

## 2024-01-16 NOTE — Congregational Nurse Program (Signed)
  Dept: [phone]   Congregational Nurse Program Note  Date of Encounter: 01/16/2024 Client to [facility] day center with request for blood pressure check and support. BP (!) 152/78 (BP Location: Left Arm, Patient Position: Sitting, Cuff Size: Large)   Pulse (!) 53   SpO2 96% . Client reports she has recently been seen at her "pain doctor", has also been to see her PCP. She continues to have elevated blood pressure and bradycardia. She reports she has her medications. Client has recently had a change in her work schedule which has led to financial stress. [facility] therapy is available at the center today and client will see that practitioner. Emotional support and therapeutic listening provided. Juvenal Opoka BSN, RN   Past Medical History: Past Medical History:  Diagnosis Date   Arthritis    Asthma    Cirrhosis (HCC)    Depression    Hep C w/o coma, chronic (HCC)    eradicated with Harvoni 2015.    Hyperlipemia    Hypertension     Encounter Details:  Community Questionnaire - 01/16/24 0900       Questionnaire   Ask client: Do you give verbal consent for me to treat you today? Yes    Student Assistance N/A    Location Patient Served  [facility]    Encounter Setting CN site    Population Status Unknown   client lives in [place]   [place]    Insurance/Financial Assistance Referral N/A    Medication N/A   client has insurance coverage for medications   Medical Provider Yes   Dr. Eldon Greenland in [place]   Screening Referrals Made N/A    Medical Referrals Made N/A    Medical Appointment Completed N/A    CNP Interventions Advocate/Support;Educate   attempted to assist client with resetting her Mychart, it was unsuccessful   Screenings CN Performed Blood Pressure    ED Visit Averted N/A    Life-Saving Intervention Made N/A

## 2024-01-17 DIAGNOSIS — M7551 Bursitis of right shoulder: Secondary | ICD-10-CM | POA: Diagnosis not present

## 2024-01-17 DIAGNOSIS — G894 Chronic pain syndrome: Secondary | ICD-10-CM | POA: Diagnosis not present

## 2024-01-17 DIAGNOSIS — M47816 Spondylosis without myelopathy or radiculopathy, lumbar region: Secondary | ICD-10-CM | POA: Diagnosis not present

## 2024-01-17 DIAGNOSIS — M5416 Radiculopathy, lumbar region: Secondary | ICD-10-CM | POA: Diagnosis not present

## 2024-01-18 ENCOUNTER — Ambulatory Visit (HOSPITAL_COMMUNITY)
Admission: RE | Admit: 2024-01-18 | Discharge: 2024-01-18 | Disposition: A | Discharge status: Home / Self Care | Source: Ambulatory Visit | Attending: Internal Medicine | Admitting: Internal Medicine

## 2024-01-18 ENCOUNTER — Encounter (HOSPITAL_COMMUNITY): Payer: Self-pay

## 2024-01-18 DIAGNOSIS — Z1231 Encounter for screening mammogram for malignant neoplasm of breast: Secondary | ICD-10-CM | POA: Insufficient documentation

## 2024-01-25 NOTE — Congregational Nurse Program (Signed)
  Dept: [phone]   Congregational Nurse Program Note  Date of Encounter: 01/25/2024 Client to [facility] day center, nurse led clinic, for blood pressure monitoring and support. BP (!) 162/70 (BP Location: Left Arm, Patient Position: Sitting, Cuff Size: Large)   Pulse (!) 45   SpO2 95% . RN has discussed several times with client about her low pulse rate and elevated BP, she insists that her PCP, Megan Odom is aware. Client contacted her PCP office during this visit to report her vital signs.. She was awaiting a return all regarding an appointment or medication change. Client reports she is taking her medication as prescribed. RN stressed the importance of this and of keeping in contact with her PCP office. Client voiced understanding. Megan Odom BSN, RN  Past Medical History: Past Medical History:  Diagnosis Date   Arthritis    Asthma    Cirrhosis (HCC)    Depression    Hep C w/o coma, chronic (HCC)    eradicated with Harvoni 2015.    Hyperlipemia    Hypertension     Encounter Details:  Community Questionnaire - 01/25/24 1055       Questionnaire   Ask client: Do you give verbal consent for me to treat you today? Yes    Student Assistance N/A    Location Patient Served  [facility]    Encounter Setting CN site    Population Status Unknown   client lives in [place]   [place]    Insurance/Financial Assistance Referral N/A    Medication N/A   client has insurance coverage for medications   Medical Provider Yes   Megan Odom in [place]   Screening Referrals Made N/A    Medical Referrals Made N/A    Medical Appointment Completed N/A    CNP Interventions Advocate/Support;Educate   attempted to assist client with resetting her Mychart, it was unsuccessful   Screenings CN Performed Blood Pressure    ED Visit Averted N/A    Life-Saving Intervention Made N/A

## 2024-01-26 DIAGNOSIS — F331 Major depressive disorder, recurrent, moderate: Secondary | ICD-10-CM | POA: Diagnosis not present

## 2024-01-26 DIAGNOSIS — I10 Essential (primary) hypertension: Secondary | ICD-10-CM | POA: Diagnosis not present

## 2024-02-06 ENCOUNTER — Emergency Department (HOSPITAL_COMMUNITY)

## 2024-02-06 ENCOUNTER — Inpatient Hospital Stay (HOSPITAL_COMMUNITY)
Admission: EM | Admit: 2024-02-06 | Discharge: 2024-02-10 | DRG: 291 | Disposition: A | Discharge status: Home / Self Care | Attending: Family Medicine | Admitting: Family Medicine

## 2024-02-06 DIAGNOSIS — Z8249 Family history of ischemic heart disease and other diseases of the circulatory system: Secondary | ICD-10-CM

## 2024-02-06 DIAGNOSIS — K219 Gastro-esophageal reflux disease without esophagitis: Secondary | ICD-10-CM | POA: Diagnosis present

## 2024-02-06 DIAGNOSIS — F17210 Nicotine dependence, cigarettes, uncomplicated: Secondary | ICD-10-CM | POA: Diagnosis present

## 2024-02-06 DIAGNOSIS — F32A Depression, unspecified: Secondary | ICD-10-CM | POA: Diagnosis present

## 2024-02-06 DIAGNOSIS — Z880 Allergy status to penicillin: Secondary | ICD-10-CM

## 2024-02-06 DIAGNOSIS — Z7951 Long term (current) use of inhaled steroids: Secondary | ICD-10-CM

## 2024-02-06 DIAGNOSIS — Z6841 Body Mass Index (BMI) 40.0 and over, adult: Secondary | ICD-10-CM

## 2024-02-06 DIAGNOSIS — E66813 Obesity, class 3: Secondary | ICD-10-CM | POA: Diagnosis present

## 2024-02-06 DIAGNOSIS — R519 Headache, unspecified: Secondary | ICD-10-CM | POA: Diagnosis present

## 2024-02-06 DIAGNOSIS — Z91048 Other nonmedicinal substance allergy status: Secondary | ICD-10-CM

## 2024-02-06 DIAGNOSIS — I509 Heart failure, unspecified: Secondary | ICD-10-CM | POA: Diagnosis not present

## 2024-02-06 DIAGNOSIS — Z833 Family history of diabetes mellitus: Secondary | ICD-10-CM

## 2024-02-06 DIAGNOSIS — Z800 Family history of malignant neoplasm of digestive organs: Secondary | ICD-10-CM

## 2024-02-06 DIAGNOSIS — J811 Chronic pulmonary edema: Secondary | ICD-10-CM | POA: Diagnosis not present

## 2024-02-06 DIAGNOSIS — I2489 Other forms of acute ischemic heart disease: Secondary | ICD-10-CM | POA: Diagnosis present

## 2024-02-06 DIAGNOSIS — D649 Anemia, unspecified: Secondary | ICD-10-CM | POA: Diagnosis present

## 2024-02-06 DIAGNOSIS — I5031 Acute diastolic (congestive) heart failure: Secondary | ICD-10-CM | POA: Diagnosis not present

## 2024-02-06 DIAGNOSIS — Z91040 Latex allergy status: Secondary | ICD-10-CM

## 2024-02-06 DIAGNOSIS — J441 Chronic obstructive pulmonary disease with (acute) exacerbation: Principal | ICD-10-CM | POA: Diagnosis present

## 2024-02-06 DIAGNOSIS — Z830 Family history of human immunodeficiency virus [HIV] disease: Secondary | ICD-10-CM

## 2024-02-06 DIAGNOSIS — B182 Chronic viral hepatitis C: Secondary | ICD-10-CM | POA: Diagnosis present

## 2024-02-06 DIAGNOSIS — K7460 Unspecified cirrhosis of liver: Secondary | ICD-10-CM | POA: Diagnosis present

## 2024-02-06 DIAGNOSIS — R262 Difficulty in walking, not elsewhere classified: Secondary | ICD-10-CM

## 2024-02-06 DIAGNOSIS — Z885 Allergy status to narcotic agent status: Secondary | ICD-10-CM

## 2024-02-06 DIAGNOSIS — K5900 Constipation, unspecified: Secondary | ICD-10-CM | POA: Diagnosis present

## 2024-02-06 DIAGNOSIS — R0602 Shortness of breath: Secondary | ICD-10-CM | POA: Diagnosis not present

## 2024-02-06 DIAGNOSIS — Z9981 Dependence on supplemental oxygen: Secondary | ICD-10-CM

## 2024-02-06 DIAGNOSIS — Z811 Family history of alcohol abuse and dependence: Secondary | ICD-10-CM

## 2024-02-06 DIAGNOSIS — E785 Hyperlipidemia, unspecified: Secondary | ICD-10-CM | POA: Diagnosis present

## 2024-02-06 DIAGNOSIS — Z8042 Family history of malignant neoplasm of prostate: Secondary | ICD-10-CM

## 2024-02-06 DIAGNOSIS — J9601 Acute respiratory failure with hypoxia: Secondary | ICD-10-CM | POA: Diagnosis present

## 2024-02-06 DIAGNOSIS — R7989 Other specified abnormal findings of blood chemistry: Secondary | ICD-10-CM | POA: Insufficient documentation

## 2024-02-06 DIAGNOSIS — Z860101 Personal history of adenomatous and serrated colon polyps: Secondary | ICD-10-CM

## 2024-02-06 DIAGNOSIS — I110 Hypertensive heart disease with heart failure: Principal | ICD-10-CM | POA: Diagnosis present

## 2024-02-06 DIAGNOSIS — R109 Unspecified abdominal pain: Secondary | ICD-10-CM

## 2024-02-06 DIAGNOSIS — M545 Low back pain, unspecified: Secondary | ICD-10-CM | POA: Diagnosis not present

## 2024-02-06 DIAGNOSIS — R058 Other specified cough: Secondary | ICD-10-CM | POA: Diagnosis not present

## 2024-02-06 DIAGNOSIS — I517 Cardiomegaly: Secondary | ICD-10-CM | POA: Diagnosis not present

## 2024-02-06 DIAGNOSIS — I10 Essential (primary) hypertension: Secondary | ICD-10-CM | POA: Diagnosis present

## 2024-02-06 DIAGNOSIS — Z1152 Encounter for screening for COVID-19: Secondary | ICD-10-CM

## 2024-02-06 DIAGNOSIS — Z7982 Long term (current) use of aspirin: Secondary | ICD-10-CM

## 2024-02-06 DIAGNOSIS — I480 Paroxysmal atrial fibrillation: Secondary | ICD-10-CM | POA: Diagnosis present

## 2024-02-06 DIAGNOSIS — Z79899 Other long term (current) drug therapy: Secondary | ICD-10-CM

## 2024-02-06 DIAGNOSIS — Z881 Allergy status to other antibiotic agents status: Secondary | ICD-10-CM

## 2024-02-06 DIAGNOSIS — E876 Hypokalemia: Secondary | ICD-10-CM | POA: Diagnosis present

## 2024-02-06 MED ORDER — IPRATROPIUM-ALBUTEROL 0.5-2.5 (3) MG/3ML IN SOLN
3.0000 mL | Freq: Once | RESPIRATORY_TRACT | Status: AC
  Administered 2024-02-07: 3 mL via RESPIRATORY_TRACT
  Filled 2024-02-06: qty 3

## 2024-02-06 MED ORDER — METHYLPREDNISOLONE SODIUM SUCC 125 MG IJ SOLR
125.0000 mg | Freq: Once | INTRAMUSCULAR | Status: AC
  Administered 2024-02-07: 125 mg via INTRAVENOUS
  Filled 2024-02-06: qty 2

## 2024-02-06 NOTE — ED Provider Notes (Signed)
 [place] EMERGENCY DEPARTMENT AT [facility] Provider Note   CSN: [id number] Arrival date & time: 02/06/24  2319     Patient presents with: Shortness of Breath   Megan Odom is a 78 y.o. female.   Patient is a 78 year old female with past medical history of paroxysmal atrial fibrillation, cirrhosis, CHF, GERD.  Patient presenting today with complaints of shortness of breath.  She describes a several day history of cough, congestion, and difficulty breathing.  Her daughter is here also being treated with similar symptoms.  She denies any fever.  Cough is intermittently productive.  She has been using her inhaler with minimal relief.       Prior to Admission medications   Medication Sig Start Date End Date Taking? Authorizing Provider  albuterol  (VENTOLIN  HFA) 108 (90 Base) MCG/ACT inhaler Inhale 2 puffs into the lungs every 4 (four) hours as needed for wheezing or shortness of breath. 07/05/19   Javan Messing, NP  aspirin  EC 81 MG tablet Take 81 mg by mouth daily.      [provider]  azithromycin  (ZITHROMAX ) 250 MG tablet Take 1 tablet (250 mg total) by mouth daily. Take first 2 tablets together, then 1 every day until finished. 09/04/23   Ninetta Basket, MD  cholecalciferol (VITAMIN D3) 25 MCG (1000 UNIT) tablet Take 1,000 Units by mouth daily.    [provider]  DULoxetine  (CYMBALTA ) 60 MG capsule Take 60 mg by mouth daily.  07/28/19   [provider]  fluticasone (FLONASE) 50 MCG/ACT nasal spray Place 2 sprays into the nose daily as needed for allergies.     [provider]  gabapentin  (NEURONTIN ) 300 MG capsule Take 300 mg by mouth at bedtime. 08/24/23   [provider]  hydrOXYzine (ATARAX) 10 MG tablet Take 10 mg by mouth every 8 (eight) hours as needed for itching. 07/07/23   [provider]  ipratropium-albuterol  (DUONEB) 0.5-2.5 (3) MG/3ML SOLN Take 3 mLs by nebulization every 6 (six) hours as  needed (shortness of breath or wheezing).  04/20/19   [provider]  KLOR-CON  M20 20 MEQ tablet Take 20 mEq by mouth daily. 08/24/23   [provider]  losartan -hydrochlorothiazide  (HYZAAR) 100-25 MG tablet Take 1 tablet by mouth daily.    [provider]  Omega-3 Fatty Acids (FISH OIL) 1000 MG CAPS Take 1,000 mg by mouth daily.    [provider]  omeprazole (PRILOSEC) 40 MG capsule Take 40 mg by mouth daily.     [provider]  spironolactone  (ALDACTONE ) 25 MG tablet Take 25 mg by mouth daily.  10/16/18   [provider]  TRELEGY ELLIPTA 100-62.5-25 MCG/ACT AEPB Inhale 1 puff into the lungs daily. 08/27/23   [provider]  verapamil  (CALAN ) 120 MG tablet Take 120 mg by mouth daily. 07/21/23   [provider]    Allergies: Darvocet [propoxyphene n-acetaminophen ], Latex, Other, Penicillins, Tetracyclines & related, and Adhesive [tape]    Review of Systems  All other systems reviewed and are negative.   Updated Vital Signs BP (!) 156/75   Pulse 61   Temp 99.2 F (37.3 C) (Oral)   Resp (!) 25   Ht 5' 5 (1.651 m)   Wt 108.9 kg   SpO2 97%   BMI 39.94 kg/m   Physical Exam Vitals and nursing note reviewed.  Constitutional:      General: She is not in acute distress.    Appearance: She is well-developed. She  is not diaphoretic.  HENT:     Head: Normocephalic and atraumatic.   Cardiovascular:     Rate and Rhythm: Normal rate and regular rhythm.     Heart sounds: No murmur heard.    No friction rub. No gallop.  Pulmonary:     Effort: Pulmonary effort is normal. No respiratory distress.     Breath sounds: Examination of the right-middle field reveals rhonchi. Examination of the left-middle field reveals rhonchi. Rhonchi present. No wheezing.  Abdominal:     General: Bowel sounds are normal. There is no distension.     Palpations: Abdomen is soft.     Tenderness: There is no abdominal tenderness.    Musculoskeletal:        General: Normal range of motion.     Cervical back: Normal range of motion and neck supple.   Skin:    General: Skin is warm and dry.   Neurological:     General: No focal deficit present.     Mental Status: She is alert and oriented to person, place, and time.     (all labs ordered are listed, but only abnormal results are displayed) Labs Reviewed  RESP PANEL BY RT-PCR (RSV, FLU A&B, COVID)  [id number]  BRAIN NATRIURETIC PEPTIDE  BASIC METABOLIC PANEL WITH GFR  CBC WITH DIFFERENTIAL/PLATELET  TROPONIN I (HIGH SENSITIVITY)    EKG: EKG Interpretation Date/Time:  Tuesday February 06 2024 23:34:05 EDT Ventricular Rate:  60 PR Interval:  199 QRS Duration:  96 QT Interval:  438 QTC Calculation: 438 R Axis:   -14  Text Interpretation: Sinus rhythm Probable left atrial enlargement Abnormal R-wave progression, early transition LVH with secondary repolarization abnormality Confirmed by Orvilla Blander ([id number]) on 02/07/2024 12:06:46 AM  Radiology: No results found.   Procedures   Medications Ordered in the ED  methylPREDNISolone  sodium succinate (SOLU-MEDROL ) 125 mg/2 mL injection 125 mg (has no administration in time range)  ipratropium-albuterol  (DUONEB) 0.5-2.5 (3) MG/3ML nebulizer solution 3 mL (has no administration in time range)  ipratropium-albuterol  (DUONEB) 0.5-2.5 (3) MG/3ML nebulizer solution 3 mL (has no administration in time range)                                    Medical Decision Making Amount and/or Complexity of Data Reviewed Labs: ordered. Radiology: ordered.  Risk Prescription drug management. Decision regarding hospitalization.   Patient is a 78 year old female with history of paroxysmal atrial fibrillation, CHF.  Patient presenting with shortness of breath as described in the HPI.  Patient arrives here with stable vital signs, but is in mild to moderate respiratory distress.  She has rhonchorous breath sounds.  Laboratory  studies obtained including CBC, basic metabolic panel, and BNP.  BNP is 137, but laboratory studies otherwise unremarkable.  COVID/flu/RSV is all negative.  Chest x-ray showing cardiomegaly with mild interstitial edema.  Patient has received IV steroids along with DuoNeb x 2.  She has also been given IV Lasix , but continues to feel dyspneic.  I feel as though patient will require admission.  I have spoken with the hospitalist who agrees to admit.     Final diagnoses:  None    ED Discharge Orders     None          Orvilla Blander, MD 02/07/24 0236

## 2024-02-06 NOTE — ED Triage Notes (Signed)
 Pt arrives via POV from home with [facility], leg swelling, and weakness since yesterday. Pt diaphoretic and satting 91% on RA. Hx Asthma, COPD

## 2024-02-07 ENCOUNTER — Inpatient Hospital Stay (HOSPITAL_COMMUNITY)

## 2024-02-07 ENCOUNTER — Other Ambulatory Visit: Payer: Self-pay

## 2024-02-07 ENCOUNTER — Encounter (HOSPITAL_COMMUNITY): Payer: Self-pay | Admitting: Internal Medicine

## 2024-02-07 DIAGNOSIS — I480 Paroxysmal atrial fibrillation: Secondary | ICD-10-CM | POA: Diagnosis not present

## 2024-02-07 DIAGNOSIS — K5900 Constipation, unspecified: Secondary | ICD-10-CM | POA: Diagnosis not present

## 2024-02-07 DIAGNOSIS — K219 Gastro-esophageal reflux disease without esophagitis: Secondary | ICD-10-CM

## 2024-02-07 DIAGNOSIS — B182 Chronic viral hepatitis C: Secondary | ICD-10-CM | POA: Diagnosis not present

## 2024-02-07 DIAGNOSIS — Z7982 Long term (current) use of aspirin: Secondary | ICD-10-CM | POA: Diagnosis not present

## 2024-02-07 DIAGNOSIS — I110 Hypertensive heart disease with heart failure: Secondary | ICD-10-CM | POA: Diagnosis not present

## 2024-02-07 DIAGNOSIS — Z1152 Encounter for screening for COVID-19: Secondary | ICD-10-CM | POA: Diagnosis not present

## 2024-02-07 DIAGNOSIS — Z833 Family history of diabetes mellitus: Secondary | ICD-10-CM | POA: Diagnosis not present

## 2024-02-07 DIAGNOSIS — I509 Heart failure, unspecified: Secondary | ICD-10-CM

## 2024-02-07 DIAGNOSIS — J441 Chronic obstructive pulmonary disease with (acute) exacerbation: Secondary | ICD-10-CM

## 2024-02-07 DIAGNOSIS — F17210 Nicotine dependence, cigarettes, uncomplicated: Secondary | ICD-10-CM | POA: Diagnosis not present

## 2024-02-07 DIAGNOSIS — E66813 Obesity, class 3: Secondary | ICD-10-CM

## 2024-02-07 DIAGNOSIS — I5031 Acute diastolic (congestive) heart failure: Secondary | ICD-10-CM | POA: Diagnosis not present

## 2024-02-07 DIAGNOSIS — K7460 Unspecified cirrhosis of liver: Secondary | ICD-10-CM | POA: Diagnosis not present

## 2024-02-07 DIAGNOSIS — R7989 Other specified abnormal findings of blood chemistry: Secondary | ICD-10-CM | POA: Diagnosis not present

## 2024-02-07 DIAGNOSIS — E785 Hyperlipidemia, unspecified: Secondary | ICD-10-CM | POA: Diagnosis not present

## 2024-02-07 DIAGNOSIS — J9601 Acute respiratory failure with hypoxia: Secondary | ICD-10-CM

## 2024-02-07 DIAGNOSIS — R0989 Other specified symptoms and signs involving the circulatory and respiratory systems: Secondary | ICD-10-CM | POA: Diagnosis not present

## 2024-02-07 DIAGNOSIS — I10 Essential (primary) hypertension: Secondary | ICD-10-CM | POA: Diagnosis not present

## 2024-02-07 DIAGNOSIS — R519 Headache, unspecified: Secondary | ICD-10-CM

## 2024-02-07 DIAGNOSIS — Z79899 Other long term (current) drug therapy: Secondary | ICD-10-CM | POA: Diagnosis not present

## 2024-02-07 DIAGNOSIS — D649 Anemia, unspecified: Secondary | ICD-10-CM | POA: Diagnosis not present

## 2024-02-07 DIAGNOSIS — Z6841 Body Mass Index (BMI) 40.0 and over, adult: Secondary | ICD-10-CM | POA: Diagnosis not present

## 2024-02-07 DIAGNOSIS — J811 Chronic pulmonary edema: Secondary | ICD-10-CM | POA: Diagnosis not present

## 2024-02-07 DIAGNOSIS — I2489 Other forms of acute ischemic heart disease: Secondary | ICD-10-CM | POA: Diagnosis not present

## 2024-02-07 DIAGNOSIS — E876 Hypokalemia: Secondary | ICD-10-CM | POA: Diagnosis not present

## 2024-02-07 DIAGNOSIS — F32A Depression, unspecified: Secondary | ICD-10-CM | POA: Diagnosis not present

## 2024-02-07 DIAGNOSIS — Z8249 Family history of ischemic heart disease and other diseases of the circulatory system: Secondary | ICD-10-CM | POA: Diagnosis not present

## 2024-02-07 LAB — CBC WITH DIFFERENTIAL/PLATELET
Abs Immature Granulocytes: 0.04 10*3/uL (ref 0.00–0.07)
Basophils Absolute: 0 10*3/uL (ref 0.0–0.1)
Basophils Relative: 0 %
Eosinophils Absolute: 0.2 10*3/uL (ref 0.0–0.5)
Eosinophils Relative: 2 %
HCT: 34.2 % — ABNORMAL LOW (ref 36.0–46.0)
Hemoglobin: 10.6 g/dL — ABNORMAL LOW (ref 12.0–15.0)
Immature Granulocytes: 1 %
Lymphocytes Relative: 30 %
Lymphs Abs: 2.3 10*3/uL (ref 0.7–4.0)
MCH: 28 pg (ref 26.0–34.0)
MCHC: 31 g/dL (ref 30.0–36.0)
MCV: 90.2 fL (ref 80.0–100.0)
Monocytes Absolute: 0.9 10*3/uL (ref 0.1–1.0)
Monocytes Relative: 12 %
Neutro Abs: 4.3 10*3/uL (ref 1.7–7.7)
Neutrophils Relative %: 55 %
Platelets: 258 10*3/uL (ref 150–400)
RBC: 3.79 MIL/uL — ABNORMAL LOW (ref 3.87–5.11)
RDW: 12.5 % (ref 11.5–15.5)
WBC: 7.7 10*3/uL (ref 4.0–10.5)
nRBC: 0 % (ref 0.0–0.2)

## 2024-02-07 LAB — RESP PANEL BY RT-PCR (RSV, FLU A&B, COVID)  RVPGX2
Influenza A by PCR: NEGATIVE
Influenza B by PCR: NEGATIVE
Resp Syncytial Virus by PCR: NEGATIVE
SARS Coronavirus 2 by RT PCR: NEGATIVE

## 2024-02-07 LAB — BASIC METABOLIC PANEL WITH GFR
Anion gap: 11 (ref 5–15)
BUN: 13 mg/dL (ref 8–23)
CO2: 22 mmol/L (ref 22–32)
Calcium: 8.3 mg/dL — ABNORMAL LOW (ref 8.9–10.3)
Chloride: 104 mmol/L (ref 98–111)
Creatinine, Ser: 0.88 mg/dL (ref 0.44–1.00)
GFR, Estimated: >60 mL/min (ref >60)
Glucose, Bld: 90 mg/dL (ref 70–99)
Potassium: 3.4 mmol/L — ABNORMAL LOW (ref 3.5–5.1)
Sodium: 137 mmol/L (ref 135–145)

## 2024-02-07 LAB — TROPONIN I (HIGH SENSITIVITY)
Troponin I (High Sensitivity): 17 ng/L (ref <18)
Troponin I (High Sensitivity): 18 ng/L — ABNORMAL HIGH (ref <18)

## 2024-02-07 LAB — ECHOCARDIOGRAM COMPLETE
Area-P 1/2: 2.35 cm2
Calc EF: 57.8 %
Height: 65 in
S' Lateral: 2.9 cm
Single Plane A2C EF: 65.2 %
Single Plane A4C EF: 50.1 %
Weight: 3936.53 [oz_av]

## 2024-02-07 LAB — MAGNESIUM: Magnesium: 1.5 mg/dL — ABNORMAL LOW (ref 1.7–2.4)

## 2024-02-07 LAB — BRAIN NATRIURETIC PEPTIDE: B Natriuretic Peptide: 137 pg/mL — ABNORMAL HIGH (ref 0.0–100.0)

## 2024-02-07 LAB — PHOSPHORUS: Phosphorus: 3.4 mg/dL (ref 2.5–4.6)

## 2024-02-07 MED ORDER — ONDANSETRON HCL 4 MG PO TABS: 4.0000 mg | ORAL_TABLET | Freq: Four times a day (QID) | ORAL | Status: DC | PRN

## 2024-02-07 MED ORDER — BUDESON-GLYCOPYRROL-FORMOTEROL 160-9-4.8 MCG/ACT IN AERO
2.0000 | INHALATION_SPRAY | Freq: Two times a day (BID) | RESPIRATORY_TRACT | Status: DC
  Administered 2024-02-07 – 2024-02-10 (×6): 2 via RESPIRATORY_TRACT
  Filled 2024-02-07 (×2): qty 5.9

## 2024-02-07 MED ORDER — IPRATROPIUM-ALBUTEROL 0.5-2.5 (3) MG/3ML IN SOLN
3.0000 mL | Freq: Four times a day (QID) | RESPIRATORY_TRACT | Status: DC
  Administered 2024-02-07 (×3): 3 mL via RESPIRATORY_TRACT
  Filled 2024-02-07 (×3): qty 3

## 2024-02-07 MED ORDER — FLUTICASONE PROPIONATE 50 MCG/ACT NA SUSP
2.0000 | Freq: Every day | NASAL | Status: DC | PRN
  Administered 2024-02-07 – 2024-02-08 (×2): 2 via NASAL
  Filled 2024-02-07: qty 16

## 2024-02-07 MED ORDER — ONDANSETRON HCL 4 MG/2ML IJ SOLN
4.0000 mg | Freq: Four times a day (QID) | INTRAMUSCULAR | Status: DC | PRN
  Administered 2024-02-07: 4 mg via INTRAVENOUS
  Filled 2024-02-07: qty 2

## 2024-02-07 MED ORDER — AZITHROMYCIN 250 MG PO TABS
500.0000 mg | ORAL_TABLET | Freq: Every day | ORAL | Status: AC
  Administered 2024-02-07: 500 mg via ORAL
  Filled 2024-02-07: qty 2

## 2024-02-07 MED ORDER — HYDROXYZINE HCL 10 MG PO TABS
10.0000 mg | ORAL_TABLET | Freq: Three times a day (TID) | ORAL | Status: DC | PRN
  Administered 2024-02-08 (×2): 10 mg via ORAL
  Filled 2024-02-07 (×2): qty 1

## 2024-02-07 MED ORDER — AZITHROMYCIN 250 MG PO TABS
250.0000 mg | ORAL_TABLET | Freq: Every day | ORAL | Status: DC
  Administered 2024-02-08 – 2024-02-10 (×3): 250 mg via ORAL
  Filled 2024-02-07 (×3): qty 1

## 2024-02-07 MED ORDER — DULOXETINE HCL 60 MG PO CPEP
60.0000 mg | ORAL_CAPSULE | Freq: Every day | ORAL | Status: DC
  Administered 2024-02-07 – 2024-02-10 (×4): 60 mg via ORAL
  Filled 2024-02-07 (×4): qty 1

## 2024-02-07 MED ORDER — VITAMIN D 25 MCG (1000 UNIT) PO TABS
1000.0000 [IU] | ORAL_TABLET | Freq: Every day | ORAL | Status: DC
  Administered 2024-02-07 – 2024-02-10 (×4): 1000 [IU] via ORAL
  Filled 2024-02-07 (×4): qty 1

## 2024-02-07 MED ORDER — POTASSIUM CHLORIDE CRYS ER 20 MEQ PO TBCR
40.0000 meq | EXTENDED_RELEASE_TABLET | Freq: Once | ORAL | Status: AC
  Administered 2024-02-07: 40 meq via ORAL
  Filled 2024-02-07: qty 2

## 2024-02-07 MED ORDER — METHYLPREDNISOLONE SODIUM SUCC 40 MG IJ SOLR
40.0000 mg | Freq: Two times a day (BID) | INTRAMUSCULAR | Status: DC
  Administered 2024-02-07 – 2024-02-08 (×4): 40 mg via INTRAVENOUS
  Filled 2024-02-07 (×5): qty 1

## 2024-02-07 MED ORDER — PANTOPRAZOLE SODIUM 40 MG PO TBEC
40.0000 mg | DELAYED_RELEASE_TABLET | Freq: Every day | ORAL | Status: DC
  Administered 2024-02-07 – 2024-02-09 (×3): 40 mg via ORAL
  Filled 2024-02-07 (×3): qty 1

## 2024-02-07 MED ORDER — SPIRONOLACTONE 25 MG PO TABS
25.0000 mg | ORAL_TABLET | Freq: Every day | ORAL | Status: DC
  Administered 2024-02-07 – 2024-02-09 (×3): 25 mg via ORAL
  Filled 2024-02-07 (×3): qty 1

## 2024-02-07 MED ORDER — FUROSEMIDE 10 MG/ML IJ SOLN
40.0000 mg | Freq: Once | INTRAMUSCULAR | Status: AC
  Administered 2024-02-07: 40 mg via INTRAVENOUS
  Filled 2024-02-07: qty 4

## 2024-02-07 MED ORDER — BUTALBITAL-APAP-CAFFEINE 50-325-40 MG PO TABS
2.0000 | ORAL_TABLET | Freq: Four times a day (QID) | ORAL | Status: DC | PRN
  Administered 2024-02-07 – 2024-02-08 (×2): 2 via ORAL
  Filled 2024-02-07 (×2): qty 2

## 2024-02-07 MED ORDER — GABAPENTIN 100 MG PO CAPS
200.0000 mg | ORAL_CAPSULE | Freq: Every day | ORAL | Status: DC
  Administered 2024-02-07 – 2024-02-09 (×3): 200 mg via ORAL
  Filled 2024-02-07 (×3): qty 2

## 2024-02-07 MED ORDER — POTASSIUM CHLORIDE CRYS ER 20 MEQ PO TBCR
20.0000 meq | EXTENDED_RELEASE_TABLET | Freq: Every day | ORAL | Status: DC
  Administered 2024-02-08 – 2024-02-09 (×2): 20 meq via ORAL
  Filled 2024-02-07 (×2): qty 1

## 2024-02-07 MED ORDER — GUAIFENESIN-DM 100-10 MG/5ML PO SYRP
5.0000 mL | ORAL_SOLUTION | ORAL | Status: DC | PRN
  Administered 2024-02-07 – 2024-02-10 (×6): 5 mL via ORAL
  Filled 2024-02-07 (×6): qty 5

## 2024-02-07 MED ORDER — DM-GUAIFENESIN ER 30-600 MG PO TB12
1.0000 | ORAL_TABLET | Freq: Two times a day (BID) | ORAL | Status: DC
  Administered 2024-02-07 – 2024-02-10 (×7): 1 via ORAL
  Filled 2024-02-07 (×7): qty 1

## 2024-02-07 MED ORDER — LOSARTAN POTASSIUM 50 MG PO TABS
100.0000 mg | ORAL_TABLET | Freq: Every day | ORAL | Status: DC
  Administered 2024-02-07 – 2024-02-10 (×4): 100 mg via ORAL
  Filled 2024-02-07 (×4): qty 2

## 2024-02-07 MED ORDER — FUROSEMIDE 10 MG/ML IJ SOLN
40.0000 mg | Freq: Two times a day (BID) | INTRAMUSCULAR | Status: DC
  Administered 2024-02-07 (×2): 40 mg via INTRAVENOUS
  Filled 2024-02-07 (×3): qty 4

## 2024-02-07 MED ORDER — IPRATROPIUM-ALBUTEROL 0.5-2.5 (3) MG/3ML IN SOLN
3.0000 mL | Freq: Three times a day (TID) | RESPIRATORY_TRACT | Status: DC
  Administered 2024-02-08 – 2024-02-10 (×7): 3 mL via RESPIRATORY_TRACT
  Filled 2024-02-07 (×7): qty 3

## 2024-02-07 MED ORDER — IPRATROPIUM-ALBUTEROL 0.5-2.5 (3) MG/3ML IN SOLN
3.0000 mL | RESPIRATORY_TRACT | Status: DC | PRN
  Administered 2024-02-09: 3 mL via RESPIRATORY_TRACT
  Filled 2024-02-07: qty 3

## 2024-02-07 MED ORDER — ENOXAPARIN SODIUM 40 MG/0.4ML IJ SOSY
40.0000 mg | PREFILLED_SYRINGE | INTRAMUSCULAR | Status: DC
  Administered 2024-02-07 – 2024-02-09 (×3): 40 mg via SUBCUTANEOUS
  Filled 2024-02-07 (×3): qty 0.4

## 2024-02-07 MED ORDER — ASPIRIN 81 MG PO TBEC
81.0000 mg | DELAYED_RELEASE_TABLET | Freq: Every day | ORAL | Status: DC
  Administered 2024-02-07 – 2024-02-10 (×4): 81 mg via ORAL
  Filled 2024-02-07 (×4): qty 1

## 2024-02-07 MED ORDER — VERAPAMIL HCL 80 MG PO TABS
120.0000 mg | ORAL_TABLET | Freq: Every day | ORAL | Status: DC
  Administered 2024-02-07 – 2024-02-10 (×4): 120 mg via ORAL
  Filled 2024-02-07 (×4): qty 2

## 2024-02-07 NOTE — Plan of Care (Signed)
  Problem: Pain Managment: Goal: General experience of comfort will improve and/or be controlled Outcome: Progressing   Problem: Safety: Goal: Ability to remain free from injury will improve Outcome: Progressing   Problem: Skin Integrity: Goal: Risk for impaired skin integrity will decrease Outcome: Progressing

## 2024-02-07 NOTE — Plan of Care (Signed)
  Problem: Education: Goal: Knowledge of General Education information will improve Description: Including pain rating scale, medication(s)/side effects and non-pharmacologic comfort measures Outcome: Progressing   Problem: Clinical Measurements: Goal: Will remain free from infection Outcome: Progressing Goal: Respiratory complications will improve Outcome: Progressing   

## 2024-02-07 NOTE — Progress Notes (Signed)
   02/07/24 0740  TOC Brief Assessment  Insurance and Status Reviewed  Patient has primary care physician Yes  Home environment has been reviewed Home with children  Prior level of function: needs assistance  Prior/Current Home Services No current home services  Social Drivers of Health Review SDOH reviewed no interventions necessary  Readmission risk has been reviewed Yes  Transition of care needs no transition of care needs at this time   Transition of Care Department [facility]) has reviewed patient and no TOC needs have been identified at this time. We will continue to monitor patient advancement through interdisciplinary progression rounds. If new patient transition needs arise, please place a TOC consult.

## 2024-02-07 NOTE — Progress Notes (Signed)
 Holding PO meds due to nausea, IV Zofran  given

## 2024-02-07 NOTE — H&P (Addendum)
 History and Physical    Patient: Megan Odom:[id number] DOB: [birth year] DOA: 02/06/2024 DOS: the patient was seen and examined on 02/07/2024 PCP: Wyvonna Heidelberg, MD  Patient coming from: Home  Chief Complaint:  Chief Complaint  Patient presents with   Shortness of Breath   HPI: Megan Odom is a 78 y.o. female with medical history significant of hypertension, paroxysmal atrial fibrillation, CHF, GERD, cirrhosis who presents to the emergency department due to 3 days onset of increasing shortness of breath which worsens with ambulation and was associated with chest congestion, cough and increased work of breathing.  Cough was intermittently productive with white phlegm.  She states that she has been using home inhaler with minimal relief.  ED Course:  In the emergency department, she was tachypneic with respiratory rate of 25/min, pulse 59 bpm, BP 156/75, O2 sat 97% on supplemental oxygen at 2 LPM, temperature 99.2 F.  Workup in the ED showed normocytic anemia, BMP was normal except for potassium of 3.4, troponin 18 > 17, BNP 137.  Influenza A, B, SARS (2, RSV was negative. Chest x-ray showed cardiomegaly with mild interstitial edema. Breathing treatment was provided, IV Solu-Medrol  120 mg x 1 was given.  TRH was asked to admit patient  Review of Systems: Review of systems as noted in the HPI. All other systems reviewed and are negative.   Past Medical History:  Diagnosis Date   Arthritis    Asthma    Cirrhosis (HCC)    Depression    Hep C w/o coma, chronic (HCC)    eradicated with Harvoni 2015.    Hyperlipemia    Hypertension    Past Surgical History:  Procedure Laterality Date   ABDOMINAL HYSTERECTOMY     APPENDECTOMY     BREAST SURGERY     traumatic injury   CHOLECYSTECTOMY     COLONOSCOPY N/A 07/03/2014   Dr. Riley Cheadle: 5 mm and 1 cm tubular adenomas removed, diverticulosis.  Next colonoscopy in 2018.    Social History:  reports that she has been  smoking cigarettes. She has a 25 pack-year smoking history. She has never used smokeless tobacco. She reports that she does not drink alcohol  and does not use drugs.   Allergies  Allergen Reactions   Darvocet [Propoxyphene N-Acetaminophen ] Other (See Comments)    Hurts ears and side of face    Latex Swelling   Other Other (See Comments)    Hairspray(isoaplus)-causes big soars in head   Penicillins Other (See Comments)    Has patient had a PCN reaction causing immediate rash, facial/tongue/throat swelling, SOB or lightheadedness with hypotension: No Has patient had a PCN reaction causing severe rash involving mucus membranes or skin necrosis: No Has patient had a PCN reaction that required hospitalization No Has patient had a PCN reaction occurring within the last 10 years: No If all of the above answers are NO, then may proceed with Cephalosporin use. Hurts ears and side of face    Tetracyclines & Related Other (See Comments)    Hurts ears and side of face   Adhesive [Tape] Itching and Rash    Telemetry pads cause itching and rash at site    Family History  Problem Relation Age of Onset   Alcohol  abuse Mother    Early death Mother    Early death Father    Alcohol  abuse Sister    HIV Brother    Diabetes Brother    Heart disease Brother    Hepatitis C  Daughter    Hypertension Son    Heart disease Brother    Diabetes Brother    Cancer Maternal Aunt        stomach   Hypertension Maternal Aunt    Heart disease Maternal Aunt    Cancer Maternal Uncle        prostate   Hypertension Maternal Uncle    Colon cancer Neg Hx      Prior to Admission medications   Medication Sig Start Date End Date Taking? Authorizing Provider  albuterol  (VENTOLIN  HFA) 108 (90 Base) MCG/ACT inhaler Inhale 2 puffs into the lungs every 4 (four) hours as needed for wheezing or shortness of breath. 07/05/19   Javan Messing, NP  aspirin  EC 81 MG tablet Take 81 mg by mouth daily.      [provider]  azithromycin  (ZITHROMAX ) 250 MG tablet Take 1 tablet (250 mg total) by mouth daily. Take first 2 tablets together, then 1 every day until finished. 09/04/23   Ninetta Basket, MD  cholecalciferol (VITAMIN D3) 25 MCG (1000 UNIT) tablet Take 1,000 Units by mouth daily.    [provider]  DULoxetine  (CYMBALTA ) 60 MG capsule Take 60 mg by mouth daily.  07/28/19   [provider]  fluticasone (FLONASE) 50 MCG/ACT nasal spray Place 2 sprays into the nose daily as needed for allergies.     [provider]  gabapentin  (NEURONTIN ) 300 MG capsule Take 300 mg by mouth at bedtime. 08/24/23   [provider]  hydrOXYzine (ATARAX) 10 MG tablet Take 10 mg by mouth every 8 (eight) hours as needed for itching. 07/07/23   [provider]  ipratropium-albuterol  (DUONEB) 0.5-2.5 (3) MG/3ML SOLN Take 3 mLs by nebulization every 6 (six) hours as needed (shortness of breath or wheezing).  04/20/19   [provider]  KLOR-CON  M20 20 MEQ tablet Take 20 mEq by mouth daily. 08/24/23   [provider]  losartan -hydrochlorothiazide  (HYZAAR) 100-25 MG tablet Take 1 tablet by mouth daily.    [provider]  Omega-3 Fatty Acids (FISH OIL) 1000 MG CAPS Take 1,000 mg by mouth daily.    [provider]  omeprazole (PRILOSEC) 40 MG capsule Take 40 mg by mouth daily.     [provider]  spironolactone  (ALDACTONE ) 25 MG tablet Take 25 mg by mouth daily.  10/16/18   [provider]  TRELEGY ELLIPTA 100-62.5-25 MCG/ACT AEPB Inhale 1 puff into the lungs daily. 08/27/23   [provider]  verapamil  (CALAN ) 120 MG tablet Take 120 mg by mouth daily. 07/21/23   [provider]    Physical Exam: BP (!) 188/76 (BP Location: Left Arm)   Pulse 64   Temp 98.1 F (36.7 C) (Oral)   Resp 20   Ht 5' 5 (1.651 m)   Wt 111.6 kg   SpO2 96%   BMI 40.94 kg/m   General: 78 y.o. year-old female well developed well  nourished in no acute distress.  Alert and oriented x3. HEENT: NCAT, EOMI Neck: Supple, trachea medial Cardiovascular: Regular rate and rhythm with no rubs or gallops.  No thyromegaly or JVD noted.  No lower extremity edema. 2/4 pulses in all 4 extremities. Respiratory: Tachypnea.  Diffuse expiratory wheezes on auscultation.   Abdomen: Soft, nontender nondistended with normal bowel sounds x4 quadrants. Muskuloskeletal: No cyanosis, clubbing or edema noted bilaterally Neuro: CN II-XII intact, strength 5/5 x 4, sensation, reflexes intact Skin: No ulcerative lesions noted or rashes Psychiatry: Judgement  and insight appear normal. Mood is appropriate for condition and setting          Labs on Admission:  Basic Metabolic Panel: Recent Labs  Lab 02/07/24 0013 02/07/24 0231  NA 137  --   K 3.4*  --   CL 104  --   CO2 22  --   GLUCOSE 90  --   BUN 13  --   CREATININE 0.88  --   CALCIUM  8.3*  --   MG  --  1.5*  PHOS  --  3.4   Liver Function Tests: No results for input(s): AST, ALT, ALKPHOS, BILITOT, PROT, ALBUMIN in the last 168 hours. No results for input(s): LIPASE, AMYLASE in the last 168 hours. No results for input(s): AMMONIA in the last 168 hours. CBC: Recent Labs  Lab 02/07/24 0013  WBC 7.7  NEUTROABS 4.3  HGB 10.6*  HCT 34.2*  MCV 90.2  PLT 258   Cardiac Enzymes: No results for input(s): CKTOTAL, CKMB, CKMBINDEX, TROPONINI in the last 168 hours.  BNP (last 3 results) Recent Labs    02/07/24 0013  BNP 137.0*    ProBNP (last 3 results) No results for input(s): PROBNP in the last 8760 hours.  CBG: No results for input(s): GLUCAP in the last 168 hours.  Radiological Exams on Admission: DG Chest Portable 1 View Result Date: 02/06/2024 EXAM: 1 VIEW XRAY OF THE CHEST 02/06/2024 11:55:00 PM COMPARISON: 09/15/2023 CLINICAL HISTORY: sob. Productive coughing, SHOB FINDINGS: LUNGS AND PLEURA: Mild interstitial edema. No definite pleural  effusions. HEART AND MEDIASTINUM: Cardiomegaly. BONES AND SOFT TISSUES: No acute osseous abnormality. IMPRESSION: 1. Cardiomegaly with mild interstitial edema. 2. No definite pleural effusions. Electronically signed by: Zadie Herter MD 02/06/2024 11:59 PM EDT RP Workstation: [id number]    EKG: I independently viewed the EKG done and my findings are as followed: Normal sinus rhythm at a rate of 60 bpm  Assessment/Plan Present on Admission:  Acute respiratory failure with hypoxia (HCC)  Essential hypertension  GERD (gastroesophageal reflux disease)  Principal Problem:   New onset of congestive heart failure (HCC) Active Problems:   Essential hypertension   GERD (gastroesophageal reflux disease)   Acute respiratory failure with hypoxia (HCC)   Acute exacerbation of chronic obstructive pulmonary disease (COPD) (HCC)   Elevated troponin   Obesity, Class III, BMI 40-49.9 (morbid obesity)  New onset CHF Chest x-ray showed cardiomegaly with mild interstitial edema Continue total input/output, daily weights and fluid restriction Continue IV Lasix  40 mg twice daily Continue heart healthy diet  Echocardiogram done in 11/2019 which showed LVEF of 66 5%.  No RWMA.  Mild LVH.  LV diastolic parameters were normal.  Echocardiogram will be done in the morning  Consider cardiology consult based on echo findings  Acute exacerbation COPD Acute respiratory failure with hypoxia Continue duo nebs, Mucinex , Solu-Medrol , azithromycin . Continue Protonix  to prevent steroid-induced ulcer Continue incentive spirometry and flutter valve Continue supplemental oxygen to maintain O2 sat > 94% with plan to wean patient off oxygen as tolerated  Elevated troponin probably secondary to type II demand ischemia Troponin 18 > 17; this has flattened  Hypokalemia K+ 3.4, this was replenished  Essential hypertension Continue losartan   GERD Continue Protonix   Obesity class III (BMI 40.94) Patient was  counseled about the cardiovascular and metabolic risk of morbid obesity. Patient was counseled for diet control, exercise regimen and weight loss.   DVT prophylaxis: Lovenox   Code Status: Full code  Family Communication: None at bedside  Consults: None  Severity of Illness: The appropriate patient status for this patient is INPATIENT. Inpatient status is judged to be reasonable and necessary in order to provide the required intensity of service to ensure the patient's safety. The patient's presenting symptoms, physical exam findings, and initial radiographic and laboratory data in the context of their chronic comorbidities is felt to place them at high risk for further clinical deterioration. Furthermore, it is not anticipated that the patient will be medically stable for discharge from the hospital within 2 midnights of admission.   * I certify that at the point of admission it is my clinical judgment that the patient will require inpatient hospital care spanning beyond 2 midnights from the point of admission due to high intensity of service, high risk for further deterioration and high frequency of surveillance required.*  Author: Melora Menon, DO 02/07/2024 6:57 AM  For on call review [email or web address].

## 2024-02-07 NOTE — Progress Notes (Signed)
 Mobility Specialist Progress Note:    02/07/24 1045  Mobility  Activity Ambulated with assistance to bathroom;Ambulated with assistance in room  Level of Assistance Standby assist, set-up cues, supervision of patient - no hands on  Assistive Device None  Distance Ambulated (ft) 45 ft  Range of Motion/Exercises Active;All extremities  Activity Response Tolerated well  Mobility Referral Yes  Mobility visit 1 Mobility  Mobility Specialist Start Time (ACUTE ONLY) 1010  Mobility Specialist Stop Time (ACUTE ONLY) 1026  Mobility Specialist Time Calculation (min) (ACUTE ONLY) 16 min   Pt received sitting EOB requesting assistance. Required supervision to stand and ambulate with no AD. Tolerated well, c/o headache. Returned supine, NT in room. All needs met.   Glinda Lapping Mobility Specialist Please contact via Special educational needs teacher or  Rehab office at [phone]

## 2024-02-07 NOTE — Progress Notes (Signed)
 ASSUMPTION OF CARE NOTE   02/07/2024 12:54 PM  Megan Odom was seen and examined.  The H&P by the admitting provider, orders, imaging was reviewed.  Please see new orders.  Will continue to follow.  I introduced myself during morning rounds and patient acknowledged but remained on phone talking with family, I waited at bedside for 10 mins but patient would not get off phone.  Pt complaining of headaches, wanting an MRI done because she has a family member with brain tumors  Will order a CT head.   Vitals:   02/07/24 1237 02/07/24 1241  BP: (!) 155/71   Pulse: 70   Resp:    Temp: 99 F (37.2 C)   SpO2: 100% 100%    Results for orders placed or performed during the hospital encounter of 02/06/24  Brain natriuretic peptide   Collection Time: 02/07/24 12:13 AM  Result Value Ref Range   B Natriuretic Peptide 137.0 (H) 0.0 - 100.0 pg/mL  Basic metabolic panel   Collection Time: 02/07/24 12:13 AM  Result Value Ref Range   Sodium 137 135 - 145 mmol/L   Potassium 3.4 (L) 3.5 - 5.1 mmol/L   Chloride 104 98 - 111 mmol/L   CO2 22 22 - 32 mmol/L   Glucose, Bld 90 70 - 99 mg/dL   BUN 13 8 - 23 mg/dL   Creatinine, Ser 4.09 0.44 - 1.00 mg/dL   Calcium  8.3 (L) 8.9 - 10.3 mg/dL   GFR, Estimated >81 >19 mL/min   Anion gap 11 5 - 15  CBC with Differential   Collection Time: 02/07/24 12:13 AM  Result Value Ref Range   WBC 7.7 4.0 - 10.5 K/uL   RBC 3.79 (L) 3.87 - 5.11 MIL/uL   Hemoglobin 10.6 (L) 12.0 - 15.0 g/dL   HCT 14.7 (L) 82.9 - 56.2 %   MCV 90.2 80.0 - 100.0 fL   MCH 28.0 26.0 - 34.0 pg   MCHC 31.0 30.0 - 36.0 g/dL   RDW 13.0 86.5 - 78.4 %   Platelets 258 150 - 400 K/uL   nRBC 0.0 0.0 - 0.2 %   Neutrophils Relative % 55 %   Neutro Abs 4.3 1.7 - 7.7 K/uL   Lymphocytes Relative 30 %   Lymphs Abs 2.3 0.7 - 4.0 K/uL   Monocytes Relative 12 %   Monocytes Absolute 0.9 0.1 - 1.0 K/uL   Eosinophils Relative 2 %   Eosinophils Absolute 0.2 0.0 - 0.5 K/uL   Basophils Relative 0  %   Basophils Absolute 0.0 0.0 - 0.1 K/uL   Immature Granulocytes 1 %   Abs Immature Granulocytes 0.04 0.00 - 0.07 K/uL  Troponin I (High Sensitivity)   Collection Time: 02/07/24 12:13 AM  Result Value Ref Range   Troponin I (High Sensitivity) 18 (H) <18 ng/L  Resp panel by RT-PCR (RSV, Flu A&B, Covid) Anterior Nasal Swab   Collection Time: 02/07/24 12:14 AM   Specimen: Anterior Nasal Swab  Result Value Ref Range   SARS Coronavirus 2 by RT PCR NEGATIVE NEGATIVE   Influenza A by PCR NEGATIVE NEGATIVE   Influenza B by PCR NEGATIVE NEGATIVE   Resp Syncytial Virus by PCR NEGATIVE NEGATIVE  Magnesium    Collection Time: 02/07/24  2:31 AM  Result Value Ref Range   Magnesium  1.5 (L) 1.7 - 2.4 mg/dL  Phosphorus   Collection Time: 02/07/24  2:31 AM  Result Value Ref Range   Phosphorus 3.4 2.5 - 4.6 mg/dL  Troponin  I (High Sensitivity)   Collection Time: 02/07/24  2:31 AM  Result Value Ref Range   Troponin I (High Sensitivity) 17 <18 ng/L   C. Lincoln Renshaw, MD [facility]   02/06/2024 11:22 PM How to contact the [facility] Attending or Consulting provider 7A - 7P or covering provider during after hours 7P -7A, for this patient?  Check the care team in [facility] and look for a) attending/consulting [facility] provider listed and b) the [facility] team listed Log into [email or web address] and use [place]'s universal password to access. If you do not have the password, please contact the hospital operator. Locate the [facility] provider you are looking for under [facility] and page to a number that you can be directly reached. If you still have difficulty reaching the provider, please page the [facility] (Director on Call) for the Hospitalists listed on amion for assistance.

## 2024-02-07 NOTE — ED Notes (Signed)
 Pt ambulated to bathroom

## 2024-02-08 ENCOUNTER — Inpatient Hospital Stay (HOSPITAL_COMMUNITY)

## 2024-02-08 DIAGNOSIS — E876 Hypokalemia: Secondary | ICD-10-CM | POA: Diagnosis not present

## 2024-02-08 DIAGNOSIS — J441 Chronic obstructive pulmonary disease with (acute) exacerbation: Secondary | ICD-10-CM | POA: Diagnosis not present

## 2024-02-08 DIAGNOSIS — I509 Heart failure, unspecified: Secondary | ICD-10-CM | POA: Diagnosis not present

## 2024-02-08 LAB — BASIC METABOLIC PANEL WITH GFR
Anion gap: 9 (ref 5–15)
BUN: 22 mg/dL (ref 8–23)
CO2: 21 mmol/L — ABNORMAL LOW (ref 22–32)
Calcium: 8.4 mg/dL — ABNORMAL LOW (ref 8.9–10.3)
Chloride: 102 mmol/L (ref 98–111)
Creatinine, Ser: 1.2 mg/dL — ABNORMAL HIGH (ref 0.44–1.00)
GFR, Estimated: 47 mL/min — ABNORMAL LOW (ref >60)
Glucose, Bld: 186 mg/dL — ABNORMAL HIGH (ref 70–99)
Potassium: 3.5 mmol/L (ref 3.5–5.1)
Sodium: 132 mmol/L — ABNORMAL LOW (ref 135–145)

## 2024-02-08 LAB — CBC
HCT: 35.2 % — ABNORMAL LOW (ref 36.0–46.0)
Hemoglobin: 10.8 g/dL — ABNORMAL LOW (ref 12.0–15.0)
MCH: 27.8 pg (ref 26.0–34.0)
MCHC: 30.7 g/dL (ref 30.0–36.0)
MCV: 90.5 fL (ref 80.0–100.0)
Platelets: 277 10*3/uL (ref 150–400)
RBC: 3.89 MIL/uL (ref 3.87–5.11)
RDW: 12.4 % (ref 11.5–15.5)
WBC: 13.1 10*3/uL — ABNORMAL HIGH (ref 4.0–10.5)
nRBC: 0 % (ref 0.0–0.2)

## 2024-02-08 LAB — MAGNESIUM: Magnesium: 1.6 mg/dL — ABNORMAL LOW (ref 1.7–2.4)

## 2024-02-08 MED ORDER — MAGNESIUM SULFATE 4 GM/100ML IV SOLN
4.0000 g | Freq: Once | INTRAVENOUS | Status: AC
  Administered 2024-02-08: 4 g via INTRAVENOUS
  Filled 2024-02-08: qty 100

## 2024-02-08 MED ORDER — LINACLOTIDE 145 MCG PO CAPS
290.0000 ug | ORAL_CAPSULE | Freq: Every day | ORAL | Status: DC
  Administered 2024-02-08 – 2024-02-10 (×3): 290 ug via ORAL
  Filled 2024-02-08 (×3): qty 2

## 2024-02-08 MED ORDER — FUROSEMIDE 40 MG PO TABS
40.0000 mg | ORAL_TABLET | Freq: Every day | ORAL | Status: DC
  Administered 2024-02-08 – 2024-02-10 (×3): 40 mg via ORAL
  Filled 2024-02-08: qty 1
  Filled 2024-02-08 (×2): qty 2

## 2024-02-08 NOTE — Evaluation (Signed)
 Physical Therapy Evaluation Patient Details Name: Megan Odom MRN: [id number] DOB: 11-05-1945 Today's Date: 02/08/2024  History of Present Illness  Megan Odom is a 78 y.o. female with medical history significant of hypertension, paroxysmal atrial fibrillation, CHF, GERD, cirrhosis who presents to the emergency department due to 3 days onset of increasing shortness of breath which worsens with ambulation and was associated with chest congestion, cough and increased work of breathing.  Cough was intermittently productive with white phlegm.  She states that she has been using home inhaler with minimal relief.   Clinical Impression  Patient functioning near baseline for functional mobility and gait demonstrating good return for bed mobility, transfers and ambulating in room, hallway without loss of balance or need for an AD. Patient limited mostly due to fatigue and mild SOB and encouraged to ambulate with nursing staff/mobility tech as tolerated for length of stay. Plan:  Patient discharged from physical therapy to care of nursing for ambulation daily as tolerated for length of stay.          If plan is discharge home, recommend the following: Help with stairs or ramp for entrance;Assistance with cooking/housework   Can travel by private vehicle        Equipment Recommendations None recommended by PT  Recommendations for Other Services       Functional Status Assessment Patient has had a recent decline in their functional status and/or demonstrates limited ability to make significant improvements in function in a reasonable and predictable amount of time     Precautions / Restrictions Precautions Precautions: None Recall of Precautions/Restrictions: Intact Restrictions Weight Bearing Restrictions Per Provider Order: No      Mobility  Bed Mobility Overal bed mobility: Independent                  Transfers Overall transfer level: Independent                       Ambulation/Gait Ambulation/Gait assistance: Modified independent (Device/Increase time) Gait Distance (Feet): 100 Feet Assistive device: None Gait Pattern/deviations: WFL(Within Functional Limits) Gait velocity: decreased     General Gait Details: grossly WFL with good return for ambulating in room, hallway without loss of balance or need for an AD, limited mostly due to c/o fatigue, mild difficulty breathing  Stairs            Wheelchair Mobility     Tilt Bed    Modified Rankin (Stroke Patients Only)       Balance Overall balance assessment: No apparent balance deficits (not formally assessed)                                           Pertinent Vitals/Pain Pain Assessment Pain Assessment: No/denies pain    Home Living Family/patient expects to be discharged to:: Private residence Living Arrangements: Children Available Help at Discharge: Family;Available PRN/intermittently Type of Home: House Home Access: Stairs to enter Entrance Stairs-Rails: None Entrance Stairs-Number of Steps: 2 Alternate Level Stairs-Number of Steps: 12 Home Layout: Two level Home Equipment: Rollator (4 wheels);Shower seat      Prior Function Prior Level of Function : Independent/Modified Independent;Working/employed;Driving             Mobility Comments: household and short distanced community ambulation with Rollator PRN, works, drives, shops, uses Social research officer, 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 ADLs Comments: Independent  Extremity/Trunk Assessment   Upper Extremity Assessment Upper Extremity Assessment: Overall WFL for tasks assessed    Lower Extremity Assessment Lower Extremity Assessment: Generalized weakness    Cervical / Trunk Assessment Cervical / Trunk Assessment: Normal  Communication   Communication Communication: No apparent difficulties    Cognition Arousal: Alert Behavior During Therapy: WFL for tasks assessed/performed    PT - Cognitive impairments: No apparent impairments                         Following commands: Intact       Cueing Cueing Techniques: Verbal cues     General Comments      Exercises     Assessment/Plan    PT Assessment All further PT needs can be met in the next venue of care  PT Problem List Decreased mobility;Decreased activity tolerance;Decreased strength       PT Treatment Interventions      PT Goals (Current goals can be found in the Care Plan section)  Acute Rehab PT Goals Patient Stated Goal: return home with family to assist PT Goal Formulation: With patient Time For Goal Achievement: 02/08/24 Potential to Achieve Goals: Good    Frequency       Co-evaluation               AM-PAC PT 6 Clicks Mobility  Outcome Measure Help needed turning from your back to your side while in a flat bed without using bedrails?: None Help needed moving from lying on your back to sitting on the side of a flat bed without using bedrails?: None Help needed moving to and from a bed to a chair (including a wheelchair)?: None Help needed standing up from a chair using your arms (e.g., wheelchair or bedside chair)?: None Help needed to walk in hospital room?: A Little Help needed climbing 3-5 steps with a railing? : A Lot 6 Click Score: 21    End of Session   Activity Tolerance: Patient tolerated treatment well;Patient limited by fatigue Patient left: in bed;with call bell/phone within reach Nurse Communication: Mobility status PT Visit Diagnosis: Unsteadiness on feet (R26.81);Other abnormalities of gait and mobility (R26.89);Muscle weakness (generalized) (M62.81)    Time: 4098-1191 PT Time Calculation (min) (ACUTE ONLY): 20 min   Charges:   PT Evaluation $PT Eval Moderate Complexity: 1 Mod PT Treatments $Therapeutic Activity: 8-22 mins PT General Charges $$ ACUTE PT VISIT: 1 Visit         3:15 PM, 02/08/24 Walton Guppy, MPT Physical Therapist  with [facility] 336 [phone] office [phone] mobile phone

## 2024-02-08 NOTE — Plan of Care (Signed)
  Problem: Education: Goal: Knowledge of General Education information will improve Description: Including pain rating scale, medication(s)/side effects and non-pharmacologic comfort measures Outcome: Progressing   Problem: Clinical Measurements: Goal: Respiratory complications will improve Outcome: Progressing   Problem: Activity: Goal: Risk for activity intolerance will decrease Outcome: Progressing   Problem: Coping: Goal: Level of anxiety will decrease Outcome: Not Progressing   

## 2024-02-08 NOTE — Progress Notes (Signed)
 Mobility Specialist Progress Note:    02/08/24 1422  Mobility  Activity Ambulated with assistance in room  Level of Assistance Modified independent, requires aide device or extra time  Assistive Device None  Distance Ambulated (ft) 35 ft  Range of Motion/Exercises Active;All extremities  Activity Response Tolerated well  Mobility Referral Yes  Mobility visit 1 Mobility  Mobility Specialist Start Time (ACUTE ONLY) 1355  Mobility Specialist Stop Time (ACUTE ONLY) 1415  Mobility Specialist Time Calculation (min) (ACUTE ONLY) 20 min   Pt received requesting assistance to ambulate. ModI to stand and ambulate with no AD. Tolerated well, asx throughout. Left with NT, all needs met.   Glinda Lapping Mobility Specialist Please contact via Special educational needs teacher or  Rehab office at [phone]

## 2024-02-08 NOTE — Hospital Course (Signed)
 78 y.o. female with medical history significant of hypertension, paroxysmal atrial fibrillation, CHF, GERD, cirrhosis who presents to the emergency department due to 3 days onset of increasing shortness of breath which worsens with ambulation and was associated with chest congestion, cough and increased work of breathing.  Cough was intermittently productive with white phlegm.  She states that she has been using home inhaler with minimal relief.   ED Course:  In the emergency department, she was tachypneic with respiratory rate of 25/min, pulse 59 bpm, BP 156/75, O2 sat 97% on supplemental oxygen at 2 LPM, temperature 99.2 F.  Workup in the ED showed normocytic anemia, BMP was normal except for potassium of 3.4, troponin 18 > 17, BNP 137.  Influenza A, B, SARS (2, RSV was negative.  Chest x-ray showed cardiomegaly with mild interstitial edema.  Breathing treatment was provided, IV Solu-Medrol  120 mg x 1 was given.  TRH was asked to admit patient

## 2024-02-08 NOTE — TOC Initial Note (Addendum)
 Transition of Care [facility]) - Initial/Assessment Note    Patient Details  Name: Megan Odom MRN: [id number] Date of Birth: April 20, 1946  Transition of Care [facility]) CM/SW Contact:    Orelia Binet, RN Phone Number: 02/08/2024, 2:27 PM  Clinical Narrative:                Patient admitted with New onset congestive heart failure. CHF education added to AVS. PT eval pending. CM spoke with patient, She lives with her daughter. 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 She drives herself. Does not have any equipment. She stated she would like a cane. CM explained insurance will pay for one walking DME aide. She will pick up a cane at the store. TOC following for PT eval for other DME recommendations.   Addendum : PT is recommending HHPT. Patient works and drives, she is agreeable to outpatient PT in [place]. Order placed.    Expected Discharge Plan: Home/Self Care Barriers to Discharge: Continued Medical Work up   Patient Goals and CMS Choice Patient states their goals for this hospitalization and ongoing recovery are:: return home CMS Medicare.gov Compare Post Acute Care list provided to:: Patient Choice offered to / list presented to : Patient [place] ownership interest in [facility].provided to:: Patient    Expected Discharge Plan and Services       Living arrangements for the past 2 months: Single Family Home                     Prior Living Arrangements/Services Living arrangements for the past 2 months: Single Family Home Lives with:: Adult Children Patient language and need for interpreter reviewed:: Yes Do you feel safe going back to the place where you live?: Yes      Need for Family Participation in Patient Care: Yes (Comment) Care giver support system in place?: Yes (comment)   Criminal Activity/Legal Involvement Pertinent to Current Situation/Hospitalization: No - Comment as needed  Activities of Daily Living   ADL  Screening (condition at time of admission) Independently performs ADLs?: Yes (appropriate for developmental age) Is the patient deaf or have difficulty hearing?: No Does the patient have difficulty seeing, even when wearing glasses/contacts?: No Does the patient have difficulty concentrating, remembering, or making decisions?: No  Permission Sought/Granted                  Emotional Assessment     Affect (typically observed): Accepting Orientation: : Oriented to Situation, Oriented to  Time, Oriented to Place, Oriented to Self Alcohol  / Substance Use: Not Applicable Psych Involvement: No (comment)  Admission diagnosis:  Acute exacerbation of CHF (congestive heart failure) (HCC) [I50.9] Patient Active Problem List   Diagnosis Date Noted   New onset of congestive heart failure (HCC) 02/07/2024   Acute exacerbation of chronic obstructive pulmonary disease (COPD) (HCC) 02/07/2024   Elevated troponin 02/07/2024   Obesity, Class III, BMI 40-49.9 (morbid obesity) 02/07/2024   Acute respiratory failure (HCC) 11/21/2019   Acute respiratory failure with hypoxia (HCC) 11/21/2019   Elevated serum immunoglobulin free light chain level 10/14/2019   Elevated serum protein level 09/23/2019   Screening for colorectal cancer 07/05/2019   Smear, vaginal, as part of routine gynecological examination 07/05/2019   Constipation 05/10/2019   Nausea without vomiting 11/07/2018   Colon adenomas 11/07/2018   Intractable abdominal pain 03/25/2016   Nausea vomiting and diarrhea 03/25/2016   AKI (acute kidney injury) (  HCC) 03/25/2016   Depression with anxiety 03/25/2016   Chronic diastolic (congestive) heart failure (HCC) 03/25/2016   Hyperglycemia 03/25/2016   GERD (gastroesophageal reflux disease) 03/25/2016   Hypokalemia 03/25/2016   Hypocalcemia 03/25/2016   Worsening headaches 11/20/2014   Dizziness 11/20/2014   Hepatic cirrhosis (HCC) 11/05/2014   Chest pain 11/05/2014   Dysphagia  11/05/2014   History of hepatitis C 11/05/2014   Sleep apnea, obstructive 01/17/2013   Paroxysmal atrial tachycardia (HCC) 01/17/2013   Essential hypertension 01/17/2013   Morbid obesity (HCC) 01/17/2013   Chest pain at rest 01/17/2013   Difficulty walking 02/17/2012   Bilateral leg weakness 02/17/2012   Back pain 02/16/2012   Bilateral anterior knee pain 02/16/2012   Knee pain, left 02/16/2012   PCP:  Wyvonna Heidelberg, MD Pharmacy:   [facility] - [place], [place] - [address] [place] [address] Phone: [phone] Fax: [phone]     Social Drivers of Health (SDOH) Social History: SDOH Screenings   Food Insecurity: No Food Insecurity (02/07/2024)  Housing: High Risk (02/07/2024)  Transportation Needs: No Transportation Needs (02/07/2024)  Utilities: Not At Risk (02/07/2024)  Alcohol  Screen: Low Risk  (09/23/2019)  Depression (PHQ2-9): Low Risk  (09/23/2019)  Financial Resource Strain: High Risk (09/23/2019)  Physical Activity: Inactive (09/23/2019)  Social Connections: Moderately Isolated (02/07/2024)  Stress: Stress Concern Present (09/23/2019)  Tobacco Use: High Risk (02/07/2024)   SDOH Interventions:

## 2024-02-08 NOTE — Progress Notes (Signed)
 PROGRESS NOTE   Megan Odom  ZOX:[id number] DOB: 1945-12-05 DOA: 02/06/2024 PCP: Fanta, Tesfaye Demissie, MD   Chief Complaint  Patient presents with   Shortness of Breath   Level of care: Telemetry  Brief Admission History:   78 y.o. female with medical history significant of hypertension, paroxysmal atrial fibrillation, CHF, GERD, cirrhosis who presents to the emergency department due to 3 days onset of increasing shortness of breath which worsens with ambulation and was associated with chest congestion, cough and increased work of breathing.  Cough was intermittently productive with white phlegm.  She states that she has been using home inhaler with minimal relief.   ED Course:  In the emergency department, she was tachypneic with respiratory rate of 25/min, pulse 59 bpm, BP 156/75, O2 sat 97% on supplemental oxygen at 2 LPM, temperature 99.2 F.  Workup in the ED showed normocytic anemia, BMP was normal except for potassium of 3.4, troponin 18 > 17, BNP 137.  Influenza A, B, SARS (2, RSV was negative.  Chest x-ray showed cardiomegaly with mild interstitial edema.  Breathing treatment was provided, IV Solu-Medrol  120 mg x 1 was given.  TRH was asked to admit patient   Assessment and Plan:  New onset CHF Chest x-ray showed cardiomegaly with mild interstitial edema Repeat CXR today shows improvement in congestion and edema Continue total input/output, daily weights and fluid restriction Change IV lasix  to oral lasix  40 mg daily  Continue heart healthy diet and fluid restriction    Echocardiogram done in 11/2019 which showed LVEF of 66 5%.  No RWMA.  Mild LVH.  LV diastolic parameters were normal.  Echocardiogram will be done in the morning  Echo 02/07/24: LVEF 65-70% with mild asymmetric septal hypertrophy. Indeterminate diastolic function.    Acute exacerbation COPD Acute respiratory failure with hypoxia Continue duo nebs, Mucinex , Solu-Medrol , azithromycin . Continue Protonix  to  prevent steroid-induced ulcer Continue incentive spirometry and flutter valve Continue supplemental oxygen to maintain O2 sat > 94% with plan to wean patient off oxygen as tolerated   Elevated troponin probably secondary to type II demand ischemia Troponin 18 > 17; this has flattened   Hypokalemia K+ 3.4, this was replenished   Essential hypertension Continue losartan    GERD Continue Protonix    Obesity class III (BMI 40.94) Patient was counseled about the cardiovascular and metabolic risk of morbid obesity. Patient was counseled for diet control, exercise regimen and weight loss.    DVT prophylaxis: enoxaparin  Code Status: Full  Family Communication:  Disposition: home tomorrow  Consultants:   Procedures:   Antimicrobials:    Subjective: Pt complains of constipation, weakness, shortness of breath and chronic headaches   Objective: Vitals:   02/08/24 0807 02/08/24 0810 02/08/24 1235 02/08/24 1414  BP:   (!) 161/57   Pulse:   65   Resp:      Temp:   98.7 F (37.1 C)   TempSrc:   Oral   SpO2: 96% 100% 96% 95%  Weight:      Height:        Intake/Output Summary (Last 24 hours) at 02/08/2024 1805 Last data filed at 02/08/2024 1300 Gross per 24 hour  Intake 480 ml  Output --  Net 480 ml   Filed Weights   02/06/24 2332 02/07/24 0304 02/08/24 0441  Weight: 108.9 kg 111.6 kg 112.9 kg   Examination:  General exam: Appears calm and comfortable  Respiratory system: diffuse expiratory wheezing heard bilateral, no crackles heard.  Cardiovascular system: normal S1 &  S2 heard. No JVD, murmurs, rubs, gallops or clicks. No pedal edema. Gastrointestinal system: Abdomen is nondistended, soft and nontender. No organomegaly or masses felt. Normal bowel sounds heard. Central nervous system: Alert and oriented. No focal neurological deficits. Extremities: Symmetric 5 x 5 power. Skin: No rashes, lesions or ulcers. Psychiatry: Judgement and insight appear normal. Mood & affect  appropriate.   Data Reviewed: I have personally reviewed following labs and imaging studies  CBC: Recent Labs  Lab 02/07/24 0013 02/08/24 0503  WBC 7.7 13.1*  NEUTROABS 4.3  --   HGB 10.6* 10.8*  HCT 34.2* 35.2*  MCV 90.2 90.5  PLT 258 277    Basic Metabolic Panel: Recent Labs  Lab 02/07/24 0013 02/07/24 0231 02/08/24 0503  NA 137  --  132*  K 3.4*  --  3.5  CL 104  --  102  CO2 22  --  21*  GLUCOSE 90  --  186*  BUN 13  --  22  CREATININE 0.88  --  1.20*  CALCIUM  8.3*  --  8.4*  MG  --  1.5* 1.6*  PHOS  --  3.4  --     CBG: No results for input(s): GLUCAP in the last 168 hours.  Recent Results (from the past 240 hours)  Resp panel by RT-PCR (RSV, Flu A&B, Covid) Anterior Nasal Swab     Status: None   Collection Time: 02/07/24 12:14 AM   Specimen: Anterior Nasal Swab  Result Value Ref Range Status   SARS Coronavirus 2 by RT PCR NEGATIVE NEGATIVE Final    Comment: (NOTE) SARS-CoV-2 target nucleic acids are NOT DETECTED.  The SARS-CoV-2 RNA is generally detectable in upper respiratory specimens during the acute phase of infection. The lowest concentration of SARS-CoV-2 viral copies this assay can detect is 138 copies/mL. A negative result does not preclude SARS-Cov-2 infection and should not be used as the sole basis for treatment or other patient management decisions. A negative result may occur with  improper specimen collection/handling, submission of specimen other than nasopharyngeal swab, presence of viral mutation(s) within the areas targeted by this assay, and inadequate number of viral copies(<138 copies/mL). A negative result must be combined with clinical observations, patient history, and epidemiological information. The expected result is Negative.  Fact Sheet for Patients:  BloggerCourse.com  Fact Sheet for Healthcare Providers:  SeriousBroker.it  This test is no t yet approved or cleared  by the [place]  FDA and  has been authorized for detection and/or diagnosis of SARS-CoV-2 by FDA under an Emergency Use Authorization (EUA). This EUA will remain  in effect (meaning this test can be used) for the duration of the COVID-19 declaration under Section 564(b)(1) of the Act, 21 U.S.C.section 360bbb-3(b)(1), unless the authorization is terminated  or revoked sooner.       Influenza A by PCR NEGATIVE NEGATIVE Final   Influenza B by PCR NEGATIVE NEGATIVE Final    Comment: (NOTE) The Xpert Xpress SARS-CoV-2/FLU/RSV plus assay is intended as an aid in the diagnosis of influenza from Nasopharyngeal swab specimens and should not be used as a sole basis for treatment. Nasal washings and aspirates are unacceptable for Xpert Xpress SARS-CoV-2/FLU/RSV testing.  Fact Sheet for Patients: BloggerCourse.com  Fact Sheet for Healthcare Providers: SeriousBroker.it  This test is not yet approved or cleared by the [place]  FDA and has been authorized for detection and/or diagnosis of SARS-CoV-2 by FDA under an Emergency Use Authorization (EUA). This EUA will remain in effect (meaning this  test can be used) for the duration of the COVID-19 declaration under Section 564(b)(1) of the Act, 21 U.S.C. section 360bbb-3(b)(1), unless the authorization is terminated or revoked.     Resp Syncytial Virus by PCR NEGATIVE NEGATIVE Final    Comment: (NOTE) Fact Sheet for Patients: BloggerCourse.com  Fact Sheet for Healthcare Providers: SeriousBroker.it  This test is not yet approved or cleared by the [place]  FDA and has been authorized for detection and/or diagnosis of SARS-CoV-2 by FDA under an Emergency Use Authorization (EUA). This EUA will remain in effect (meaning this test can be used) for the duration of the COVID-19 declaration under Section 564(b)(1) of the Act, 21  U.S.C. section 360bbb-3(b)(1), unless the authorization is terminated or revoked.  Performed at [facility], [address], [place], [place] [address]      Radiology Studies: DG CHEST PORT 1 VIEW Result Date: 02/08/2024 CLINICAL DATA:  Pulmonary edema EXAM: PORTABLE CHEST 1 VIEW COMPARISON:  February 06, 2024 FINDINGS: Residual interstitial prominence however, interval improvement in the pulmonary infiltrates correlate with improving congestive changes and pulmonary edema No pleural effusions Heart and mediastinum within normal limits IMPRESSION: Improving congestive changes and pulmonary edema. Electronically Signed   By: Fredrich Jefferson M.D.   On: 02/08/2024 07:00   CT HEAD WO CONTRAST ( ) Result Date: 02/07/2024 CLINICAL DATA:  Headache, increasing frequency or severity EXAM: CT HEAD WITHOUT CONTRAST TECHNIQUE: Contiguous axial images were obtained from the base of the skull through the vertex without intravenous contrast. RADIATION DOSE REDUCTION: This exam was performed according to the departmental dose-optimization program which includes automated exposure control, adjustment of the mA and/or kV according to patient size and/or use of iterative reconstruction technique. COMPARISON:  CT head June 28, 2022. FINDINGS: Brain: No evidence of acute infarction, hemorrhage, hydrocephalus, extra-axial collection or mass lesion/mass effect. Vascular: No hyperdense vessel. Skull: No acute fracture. Sinuses/Orbits: Clear sinuses.  No acute orbital findings. Other: No mastoid effusions. IMPRESSION: No evidence of acute intracranial abnormality. Electronically Signed   By: Stevenson Elbe M.D.   On: 02/07/2024 22:59   ECHOCARDIOGRAM COMPLETE Result Date: 02/07/2024    ECHOCARDIOGRAM REPORT   Patient Name:   Megan Odom Date of Exam: 02/07/2024 Medical Rec #:  [id number]          Height:       65.0 in Accession #:    [id number]         Weight:       246.0 lb Date of Birth:  11-28-1945           BSA:           2.160 m Patient Age:    78 years           BP:           188/76 mmHg Patient Gender: F                  HR:           61 bpm. Exam Location:  [facility] Procedure: 2D Echo, Cardiac Doppler and Color Doppler (Both Spectral and Color            Flow Doppler were utilized during procedure). Indications:    CHF  History:        Patient has prior history of Echocardiogram examinations. Risk                 Factors:Hypertension.  Sonographer:    Farrell Honey Key Referring Phys: [id number]  OLADAPO ADEFESO IMPRESSIONS  1. Left ventricular ejection fraction, by estimation, is 65 to 70%. The left ventricle has normal function. The left ventricle has no regional wall motion abnormalities. There is mild asymmetric left ventricular hypertrophy of the septal segment. Left ventricular diastolic parameters are indeterminate.  2. Right ventricular systolic function is normal. The right ventricular size is normal. Tricuspid regurgitation signal is inadequate for assessing PA pressure.  3. Left atrial size was mild to moderately dilated.  4. The mitral valve is degenerative. Mild mitral valve regurgitation.  5. The aortic valve is tricuspid. There is mild calcification of the aortic valve. Aortic valve regurgitation is trivial. Aortic valve sclerosis/calcification is present, without any evidence of aortic stenosis.  6. The inferior vena cava is normal in size with greater than 50% respiratory variability, suggesting right atrial pressure of 3 mmHg. Comparison(s): Prior images reviewed side by side. LVEF 65-70% with mild asymmetric septal hypertrophy. Indeterminate diastolic function. FINDINGS  Left Ventricle: Left ventricular ejection fraction, by estimation, is 65 to 70%. The left ventricle has normal function. The left ventricle has no regional wall motion abnormalities. The left ventricular internal cavity size was normal in size. There is  mild asymmetric left ventricular hypertrophy of the septal segment. Left ventricular diastolic  parameters are indeterminate. Right Ventricle: The right ventricular size is normal. No increase in right ventricular wall thickness. Right ventricular systolic function is normal. Tricuspid regurgitation signal is inadequate for assessing PA pressure. Left Atrium: Left atrial size was mild to moderately dilated. Right Atrium: Right atrial size was normal in size. Pericardium: There is no evidence of pericardial effusion. Mitral Valve: The mitral valve is degenerative in appearance. Mild mitral annular calcification. Mild mitral valve regurgitation. Tricuspid Valve: The tricuspid valve is grossly normal. Tricuspid valve regurgitation is mild. Aortic Valve: The aortic valve is tricuspid. There is mild calcification of the aortic valve. There is mild to moderate aortic valve annular calcification. Aortic valve regurgitation is trivial. Aortic valve sclerosis/calcification is present, without any evidence of aortic stenosis. Pulmonic Valve: The pulmonic valve was grossly normal. Pulmonic valve regurgitation is trivial. Aorta: The aortic root and ascending aorta are structurally normal, with no evidence of dilitation. Venous: The inferior vena cava is normal in size with greater than 50% respiratory variability, suggesting right atrial pressure of 3 mmHg. IAS/Shunts: No atrial level shunt detected by color flow Doppler. Additional Comments: 3D was performed not requiring image post processing on an independent workstation and was indeterminate.  LEFT VENTRICLE PLAX 2D LVIDd:         4.30 cm      Diastology LVIDs:         2.90 cm      LV e' medial:    4.35 cm/s LV PW:         1.00 cm      LV E/e' medial:  15.5 LV IVS:        1.20 cm      LV e' lateral:   5.52 cm/s LVOT diam:     1.70 cm      LV E/e' lateral: 12.2 LV SV:         69 LV SV Index:   32 LVOT Area:     2.27 cm  LV Volumes (MOD) LV vol d, MOD A2C: 98.5 ml LV vol d, MOD A4C: 101.0 ml LV vol s, MOD A2C: 34.3 ml LV vol s, MOD A4C: 50.4 ml LV SV MOD A2C:     64.2  ml  LV SV MOD A4C:     101.0 ml LV SV MOD BP:      57.2 ml RIGHT VENTRICLE RV Basal diam:  3.90 cm RV S prime:     12.30 cm/s TAPSE (M-mode): 2.5 cm LEFT ATRIUM             Index        RIGHT ATRIUM           Index LA diam:        4.60 cm 2.13 cm/m   RA Area:     17.90 cm LA Vol (A2C):   78.0 ml 36.11 ml/m  RA Volume:   46.60 ml  21.57 ml/m LA Vol (A4C):   84.9 ml 39.30 ml/m LA Biplane Vol: 83.4 ml 38.61 ml/m  AORTIC VALVE LVOT Vmax:   147.00 cm/s LVOT Vmean:  106.000 cm/s LVOT VTI:    0.302 m  AORTA Ao Root diam: 3.20 cm Ao Asc diam:  3.30 cm MITRAL VALVE MV Area (PHT): 2.35 cm    SHUNTS MV Decel Time: 323 msec    Systemic VTI:  0.30 m MV E velocity: 67.60 cm/s  Systemic Diam: 1.70 cm MV A velocity: 99.90 cm/s MV E/A ratio:  0.68 Teddie Favre MD Electronically signed by Teddie Favre MD Signature Date/Time: 02/07/2024/2:01:49 PM    Final    DG Chest Portable 1 View Result Date: 02/06/2024 EXAM: 1 VIEW XRAY OF THE CHEST 02/06/2024 11:55:00 PM COMPARISON: 09/15/2023 CLINICAL HISTORY: sob. Productive coughing, SHOB FINDINGS: LUNGS AND PLEURA: Mild interstitial edema. No definite pleural effusions. HEART AND MEDIASTINUM: Cardiomegaly. BONES AND SOFT TISSUES: No acute osseous abnormality. IMPRESSION: 1. Cardiomegaly with mild interstitial edema. 2. No definite pleural effusions. Electronically signed by: Zadie Herter MD 02/06/2024 11:59 PM EDT RP Workstation: [id number]    Scheduled Meds:  aspirin  EC  81 mg Oral Daily   azithromycin   250 mg Oral Daily   budesonide -glycopyrrolate-formoterol  2 puff Inhalation BID   cholecalciferol  1,000 Units Oral Daily   dextromethorphan -guaiFENesin   1 tablet Oral BID   DULoxetine   60 mg Oral Daily   enoxaparin  (LOVENOX ) injection  40 mg Subcutaneous Q24H   furosemide   40 mg Oral Daily   gabapentin   200 mg Oral QHS   ipratropium-albuterol   3 mL Nebulization TID   linaclotide   290 mcg Oral QAC breakfast   losartan   100 mg Oral Daily   methylPREDNISolone   (SOLU-MEDROL ) injection  40 mg Intravenous Q12H   pantoprazole   40 mg Oral Daily   potassium chloride  SA  20 mEq Oral Daily   spironolactone   25 mg Oral Daily   verapamil   120 mg Oral Daily   Continuous Infusions:   LOS: 1 day   Time spent: 56 mins  Junious Ragone Lincoln Renshaw, MD How to contact the [facility] Attending or Consulting provider 7A - 7P or covering provider during after hours 7P -7A, for this patient?  Check the care team in [facility] and look for a) attending/consulting [facility] provider listed and b) the [facility] team listed Log into [email or web address] to find provider on call.  Locate the [facility] provider you are looking for under [facility] and page to a number that you can be directly reached. If you still have difficulty reaching the provider, please page the [facility] (Director on Call) for the Hospitalists listed on amion for assistance.  02/08/2024, 6:05 PM

## 2024-02-09 DIAGNOSIS — I509 Heart failure, unspecified: Secondary | ICD-10-CM | POA: Diagnosis not present

## 2024-02-09 DIAGNOSIS — J441 Chronic obstructive pulmonary disease with (acute) exacerbation: Secondary | ICD-10-CM | POA: Diagnosis not present

## 2024-02-09 DIAGNOSIS — E876 Hypokalemia: Secondary | ICD-10-CM | POA: Diagnosis not present

## 2024-02-09 LAB — BASIC METABOLIC PANEL WITH GFR
Anion gap: 5 (ref 5–15)
BUN: 26 mg/dL — ABNORMAL HIGH (ref 8–23)
CO2: 23 mmol/L (ref 22–32)
Calcium: 8.6 mg/dL — ABNORMAL LOW (ref 8.9–10.3)
Chloride: 104 mmol/L (ref 98–111)
Creatinine, Ser: 1.18 mg/dL — ABNORMAL HIGH (ref 0.44–1.00)
GFR, Estimated: 48 mL/min — ABNORMAL LOW (ref >60)
Glucose, Bld: 196 mg/dL — ABNORMAL HIGH (ref 70–99)
Potassium: 4.8 mmol/L (ref 3.5–5.1)
Sodium: 132 mmol/L — ABNORMAL LOW (ref 135–145)

## 2024-02-09 LAB — GLUCOSE, CAPILLARY
Glucose-Capillary: 183 mg/dL — ABNORMAL HIGH (ref 70–99)
Glucose-Capillary: 119 mg/dL — ABNORMAL HIGH (ref 70–99)
Glucose-Capillary: 206 mg/dL — ABNORMAL HIGH (ref 70–99)

## 2024-02-09 MED ORDER — INSULIN ASPART 100 UNIT/ML IJ SOLN
0.0000 [IU] | Freq: Three times a day (TID) | INTRAMUSCULAR | Status: DC
  Administered 2024-02-09: 2 [IU] via SUBCUTANEOUS
  Administered 2024-02-09: 1 [IU] via SUBCUTANEOUS
  Administered 2024-02-10: 2 [IU] via SUBCUTANEOUS

## 2024-02-09 MED ORDER — SODIUM ZIRCONIUM CYCLOSILICATE 10 G PO PACK
10.0000 g | PACK | Freq: Once | ORAL | Status: AC
  Administered 2024-02-09: 10 g via ORAL
  Filled 2024-02-09: qty 1

## 2024-02-09 MED ORDER — METHYLPREDNISOLONE SODIUM SUCC 125 MG IJ SOLR
125.0000 mg | Freq: Two times a day (BID) | INTRAMUSCULAR | Status: AC
  Administered 2024-02-09: 125 mg via INTRAVENOUS
  Filled 2024-02-09: qty 2

## 2024-02-09 MED ORDER — PREDNISONE 20 MG PO TABS
50.0000 mg | ORAL_TABLET | Freq: Every day | ORAL | Status: DC
  Administered 2024-02-10: 50 mg via ORAL
  Filled 2024-02-09: qty 1

## 2024-02-09 MED ORDER — SPIRONOLACTONE 12.5 MG HALF TABLET
12.5000 mg | ORAL_TABLET | Freq: Every day | ORAL | Status: DC
  Administered 2024-02-10: 12.5 mg via ORAL
  Filled 2024-02-09: qty 1

## 2024-02-09 MED ORDER — PANTOPRAZOLE SODIUM 40 MG PO TBEC
40.0000 mg | DELAYED_RELEASE_TABLET | Freq: Two times a day (BID) | ORAL | Status: DC
  Administered 2024-02-09 – 2024-02-10 (×2): 40 mg via ORAL
  Filled 2024-02-09 (×2): qty 1

## 2024-02-09 MED ORDER — METHYLPREDNISOLONE SODIUM SUCC 125 MG IJ SOLR: 60.0000 mg | Freq: Two times a day (BID) | INTRAMUSCULAR | Status: DC

## 2024-02-09 NOTE — Care Management Important Message (Signed)
 Important Message  Patient Details  Name: Megan Odom MRN: [id number] Date of Birth: 1946-01-27   Important Message Given:  Yes - Medicare IM     Delson Dulworth L Karem Tomaso 02/09/2024, 9:45 AM

## 2024-02-09 NOTE — Progress Notes (Signed)
 PROGRESS NOTE   Megan Odom  JXB:[id number] DOB: 1946-07-13 DOA: 02/06/2024 PCP: Fanta, Tesfaye Demissie, MD   Chief Complaint  Patient presents with   Shortness of Breath   Level of care: Telemetry  Brief Admission History:   78 y.o. female with medical history significant of hypertension, paroxysmal atrial fibrillation, CHF, GERD, cirrhosis who presents to the emergency department due to 3 days onset of increasing shortness of breath which worsens with ambulation and was associated with chest congestion, cough and increased work of breathing.  Cough was intermittently productive with white phlegm.  She states that she has been using home inhaler with minimal relief.   ED Course:  In the emergency department, she was tachypneic with respiratory rate of 25/min, pulse 59 bpm, BP 156/75, O2 sat 97% on supplemental oxygen at 2 LPM, temperature 99.2 F.  Workup in the ED showed normocytic anemia, BMP was normal except for potassium of 3.4, troponin 18 > 17, BNP 137.  Influenza A, B, SARS (2, RSV was negative.  Chest x-ray showed cardiomegaly with mild interstitial edema.  Breathing treatment was provided, IV Solu-Medrol  120 mg x 1 was given.  TRH was asked to admit patient   Assessment and Plan:  New onset CHF Chest x-ray showed cardiomegaly with mild interstitial edema Repeat CXR today shows improvement in congestion and edema Continue total input/output, daily weights and fluid restriction Change IV lasix  to oral lasix  40 mg daily  Continue heart healthy diet and fluid restriction    Echocardiogram done in 11/2019 which showed LVEF of 66 5%.  No RWMA.  Mild LVH.  LV diastolic parameters were normal.  Echocardiogram will be done in the morning  Echo 02/07/24: LVEF 65-70% with mild asymmetric septal hypertrophy. Indeterminate diastolic function.  Start ambulating with PT   Acute exacerbation COPD Acute respiratory failure with hypoxia Continue duo nebs, Mucinex , Solu-Medrol ,  azithromycin . Continue Protonix  to prevent steroid-induced ulcer Continue incentive spirometry and flutter valve Continue supplemental oxygen to maintain O2 sat > 94% with plan to wean patient off oxygen as tolerated Planning 1 more dose of IV steroid today, then start oral prednisone  tomorrow    Elevated troponin probably secondary to type II demand ischemia Troponin 18 > 17; this has flattened   Hypokalemia K+ 3.4, this was replenished - with ongoing potassium supplementation and spironolactone  K is up to 4.8 - will stop oral potassium supplement, reduce spironolaction - give 1 dose of lokelma 10 g x 1 dose today   Essential hypertension Continue losartan    GERD Continue Protonix    Obesity class III (BMI 40.94) Patient was counseled about the cardiovascular and metabolic risk of morbid obesity. Patient was counseled for diet control, exercise regimen and weight loss.    DVT prophylaxis: enoxaparin  Code Status: Full  Family Communication:  Disposition: home tomorrow if improved Consultants:   Procedures:   Antimicrobials:    Subjective: Pt complains of constipation, weakness, shortness of breath and chronic headaches   Objective: Vitals:   02/09/24 0831 02/09/24 0840 02/09/24 1332 02/09/24 1436  BP:   (!) 146/80   Pulse:   (!) 57   Resp:      Temp:   99.2 F (37.3 C)   TempSrc:   Oral   SpO2: 98% 99% 93% 99%  Weight:      Height:        Intake/Output Summary (Last 24 hours) at 02/09/2024 1648 Last data filed at 02/09/2024 0559 Gross per 24 hour  Intake 266.95 ml  Output --  Net 266.95 ml   Filed Weights   02/07/24 0304 02/08/24 0441 02/09/24 0527  Weight: 111.6 kg 112.9 kg 113.9 kg   Examination:  General exam: Appears calm and comfortable  Respiratory system: diffuse expiratory wheezing heard bilateral, no crackles heard.  Cardiovascular system: normal S1 & S2 heard. No JVD, murmurs, rubs, gallops or clicks. No pedal edema. Gastrointestinal system:  Abdomen is nondistended, soft and nontender. No organomegaly or masses felt. Normal bowel sounds heard. Central nervous system: Alert and oriented. No focal neurological deficits. Extremities: Symmetric 5 x 5 power. Skin: No rashes, lesions or ulcers. Psychiatry: Judgement and insight appear normal. Mood & affect appropriate.   Data Reviewed: I have personally reviewed following labs and imaging studies  CBC: Recent Labs  Lab 02/07/24 0013 02/08/24 0503  WBC 7.7 13.1*  NEUTROABS 4.3  --   HGB 10.6* 10.8*  HCT 34.2* 35.2*  MCV 90.2 90.5  PLT 258 277    Basic Metabolic Panel: Recent Labs  Lab 02/07/24 0013 02/07/24 0231 02/08/24 0503 02/09/24 0415  NA 137  --  132* 132*  K 3.4*  --  3.5 4.8  CL 104  --  102 104  CO2 22  --  21* 23  GLUCOSE 90  --  186* 196*  BUN 13  --  22 26*  CREATININE 0.88  --  1.20* 1.18*  CALCIUM  8.3*  --  8.4* 8.6*  MG  --  1.5* 1.6*  --   PHOS  --  3.4  --   --     CBG: Recent Labs  Lab 02/09/24 1211 02/09/24 1611  GLUCAP 183* 119*    Recent Results (from the past 240 hours)  Resp panel by RT-PCR (RSV, Flu A&B, Covid) Anterior Nasal Swab     Status: None   Collection Time: 02/07/24 12:14 AM   Specimen: Anterior Nasal Swab  Result Value Ref Range Status   SARS Coronavirus 2 by RT PCR NEGATIVE NEGATIVE Final    Comment: (NOTE) SARS-CoV-2 target nucleic acids are NOT DETECTED.  The SARS-CoV-2 RNA is generally detectable in upper respiratory specimens during the acute phase of infection. The lowest concentration of SARS-CoV-2 viral copies this assay can detect is 138 copies/mL. A negative result does not preclude SARS-Cov-2 infection and should not be used as the sole basis for treatment or other patient management decisions. A negative result may occur with  improper specimen collection/handling, submission of specimen other than nasopharyngeal swab, presence of viral mutation(s) within the areas targeted by this assay, and  inadequate number of viral copies(<138 copies/mL). A negative result must be combined with clinical observations, patient history, and epidemiological information. The expected result is Negative.  Fact Sheet for Patients:  BloggerCourse.com  Fact Sheet for Healthcare Providers:  SeriousBroker.it  This test is no t yet approved or cleared by the [place]  FDA and  has been authorized for detection and/or diagnosis of SARS-CoV-2 by FDA under an Emergency Use Authorization (EUA). This EUA will remain  in effect (meaning this test can be used) for the duration of the COVID-19 declaration under Section 564(b)(1) of the Act, 21 U.S.C.section 360bbb-3(b)(1), unless the authorization is terminated  or revoked sooner.       Influenza A by PCR NEGATIVE NEGATIVE Final   Influenza B by PCR NEGATIVE NEGATIVE Final    Comment: (NOTE) The Xpert Xpress SARS-CoV-2/FLU/RSV plus assay is intended as an aid in the diagnosis of influenza from Nasopharyngeal swab specimens and should  not be used as a sole basis for treatment. Nasal washings and aspirates are unacceptable for Xpert Xpress SARS-CoV-2/FLU/RSV testing.  Fact Sheet for Patients: BloggerCourse.com  Fact Sheet for Healthcare Providers: SeriousBroker.it  This test is not yet approved or cleared by the [place]  FDA and has been authorized for detection and/or diagnosis of SARS-CoV-2 by FDA under an Emergency Use Authorization (EUA). This EUA will remain in effect (meaning this test can be used) for the duration of the COVID-19 declaration under Section 564(b)(1) of the Act, 21 U.S.C. section 360bbb-3(b)(1), unless the authorization is terminated or revoked.     Resp Syncytial Virus by PCR NEGATIVE NEGATIVE Final    Comment: (NOTE) Fact Sheet for Patients: BloggerCourse.com  Fact Sheet for Healthcare  Providers: SeriousBroker.it  This test is not yet approved or cleared by the [place]  FDA and has been authorized for detection and/or diagnosis of SARS-CoV-2 by FDA under an Emergency Use Authorization (EUA). This EUA will remain in effect (meaning this test can be used) for the duration of the COVID-19 declaration under Section 564(b)(1) of the Act, 21 U.S.C. section 360bbb-3(b)(1), unless the authorization is terminated or revoked.  Performed at [facility], [address] [place], [place] [address]      Radiology Studies: DG CHEST PORT 1 VIEW Result Date: 02/08/2024 CLINICAL DATA:  Pulmonary edema EXAM: PORTABLE CHEST 1 VIEW COMPARISON:  February 06, 2024 FINDINGS: Residual interstitial prominence however, interval improvement in the pulmonary infiltrates correlate with improving congestive changes and pulmonary edema No pleural effusions Heart and mediastinum within normal limits IMPRESSION: Improving congestive changes and pulmonary edema. Electronically Signed   By: Fredrich Jefferson M.D.   On: 02/08/2024 07:00    Scheduled Meds:  aspirin  EC  81 mg Oral Daily   azithromycin   250 mg Oral Daily   budesonide -glycopyrrolate-formoterol  2 puff Inhalation BID   cholecalciferol  1,000 Units Oral Daily   dextromethorphan -guaiFENesin   1 tablet Oral BID   DULoxetine   60 mg Oral Daily   enoxaparin  (LOVENOX ) injection  40 mg Subcutaneous Q24H   furosemide   40 mg Oral Daily   gabapentin   200 mg Oral QHS   insulin  aspart  0-9 Units Subcutaneous TID WC   ipratropium-albuterol   3 mL Nebulization TID   linaclotide   290 mcg Oral QAC breakfast   losartan   100 mg Oral Daily   methylPREDNISolone  (SOLU-MEDROL ) injection  60 mg Intravenous Q12H   pantoprazole   40 mg Oral Daily   potassium chloride  SA  20 mEq Oral Daily   spironolactone   25 mg Oral Daily   verapamil   120 mg Oral Daily   Continuous Infusions:   LOS: 2 days   Time spent: 55 mins  Allisha Harter Lincoln Renshaw,  MD How to contact the [facility] Attending or Consulting provider 7A - 7P or covering provider during after hours 7P -7A, for this patient?  Check the care team in [facility] and look for a) attending/consulting TRH provider listed and b) the TRH team listed Log into [email or web address] to find provider on call.  Locate the TRH provider you are looking for under [facility] and page to a number that you can be directly reached. If you still have difficulty reaching the provider, please page the [facility] (Director on Call) for the Hospitalists listed on amion for assistance.  02/09/2024, 4:48 PM

## 2024-02-09 NOTE — Plan of Care (Signed)

## 2024-02-10 DIAGNOSIS — J441 Chronic obstructive pulmonary disease with (acute) exacerbation: Secondary | ICD-10-CM | POA: Diagnosis not present

## 2024-02-10 DIAGNOSIS — J9601 Acute respiratory failure with hypoxia: Secondary | ICD-10-CM | POA: Diagnosis not present

## 2024-02-10 DIAGNOSIS — I509 Heart failure, unspecified: Secondary | ICD-10-CM | POA: Diagnosis not present

## 2024-02-10 LAB — BASIC METABOLIC PANEL WITH GFR
Anion gap: 5 (ref 5–15)
BUN: 30 mg/dL — ABNORMAL HIGH (ref 8–23)
CO2: 22 mmol/L (ref 22–32)
Calcium: 8.6 mg/dL — ABNORMAL LOW (ref 8.9–10.3)
Chloride: 106 mmol/L (ref 98–111)
Creatinine, Ser: 1.16 mg/dL — ABNORMAL HIGH (ref 0.44–1.00)
GFR, Estimated: 49 mL/min — ABNORMAL LOW (ref >60)
Glucose, Bld: 154 mg/dL — ABNORMAL HIGH (ref 70–99)
Potassium: 5 mmol/L (ref 3.5–5.1)
Sodium: 133 mmol/L — ABNORMAL LOW (ref 135–145)

## 2024-02-10 LAB — GLUCOSE, CAPILLARY
Glucose-Capillary: 183 mg/dL — ABNORMAL HIGH (ref 70–99)
Glucose-Capillary: 123 mg/dL — ABNORMAL HIGH (ref 70–99)

## 2024-02-10 LAB — MAGNESIUM: Magnesium: 2.1 mg/dL (ref 1.7–2.4)

## 2024-02-10 MED ORDER — FUROSEMIDE 20 MG PO TABS: 20.0000 mg | ORAL_TABLET | Freq: Every day | ORAL | 1 refills | Status: AC

## 2024-02-10 MED ORDER — GUAIFENESIN-DM 100-10 MG/5ML PO SYRP: 10.0000 mL | ORAL_SOLUTION | ORAL | 0 refills | Status: DC | PRN

## 2024-02-10 MED ORDER — AZITHROMYCIN 250 MG PO TABS: 250.0000 mg | ORAL_TABLET | Freq: Every day | ORAL | 0 refills | Status: AC

## 2024-02-10 MED ORDER — BUTALBITAL-APAP-CAFFEINE 50-325-40 MG PO TABS: 1.0000 | ORAL_TABLET | Freq: Four times a day (QID) | ORAL | 0 refills | Status: AC | PRN

## 2024-02-10 MED ORDER — PREDNISONE 50 MG PO TABS: 50.0000 mg | ORAL_TABLET | Freq: Every day | ORAL | 0 refills | Status: AC

## 2024-02-10 MED ORDER — IPRATROPIUM-ALBUTEROL 0.5-2.5 (3) MG/3ML IN SOLN: 3.0000 mL | RESPIRATORY_TRACT | 2 refills | Status: AC | PRN

## 2024-02-10 MED ORDER — ALBUTEROL SULFATE HFA 108 (90 BASE) MCG/ACT IN AERS: 1.0000 | INHALATION_SPRAY | RESPIRATORY_TRACT | 2 refills | Status: AC | PRN

## 2024-02-10 MED ORDER — LOSARTAN POTASSIUM 100 MG PO TABS: 100.0000 mg | ORAL_TABLET | Freq: Every day | ORAL | 1 refills | Status: AC

## 2024-02-10 NOTE — TOC Progression Note (Signed)
 Transition of Care [facility]) - Progression Note    Patient Details  Name: Megan Odom MRN: [id number] Date of Birth: [birth year]  [facility] of [facility] CM/SW Contact  Nena LITTIE Coffee, RN Phone Number: 02/10/2024, 11:41 AM  Clinical Narrative:    Pt to dc home when ready. CHF education added to AVS. Outpt PT ordered. TOC signing off.    Expected Discharge Plan: Home/Self Care Barriers to Discharge: Barriers Resolved  Expected Discharge Plan and Services       Living arrangements for the past 2 months: Single Family Home Expected Discharge Date: 02/10/24                                     Social Determinants of Health (SDOH) Interventions SDOH Screenings   Food Insecurity: No Food Insecurity (02/07/2024)  Housing: High Risk (02/07/2024)  Transportation Needs: No Transportation Needs (02/07/2024)  Utilities: Not At Risk (02/07/2024)  Alcohol  Screen: Low Risk  (09/23/2019)  Depression (PHQ2-9): Low Risk  (09/23/2019)  Financial Resource Strain: High Risk (09/23/2019)  Physical Activity: Inactive (09/23/2019)  Social Connections: Moderately Isolated (02/07/2024)  Stress: Stress Concern Present (09/23/2019)  Tobacco Use: High Risk (02/07/2024)    Readmission Risk Interventions     No data to display

## 2024-02-10 NOTE — Discharge Summary (Addendum)
 Physician Discharge Summary  Megan Odom FMW:[id number] DOB: [birth year] DOA: 02/06/2024  PCP: Fanta, Tesfaye Demissie, MD  Admit date: 02/06/2024 Discharge date: 02/10/2024  Admitted From:  Home  Disposition: Home with HH   Recommendations for Outpatient Follow-up:  Follow up with PCP in 1 weeks Please obtain BMP in 1 week  Home Health: PT  Discharge Condition: STABLE   CODE STATUS: FULL DIET: 2 gram sodium restricted heart healthy recommended   Brief Hospitalization Summary: Please see all hospital notes, images, labs for full details of the hospitalization. Admission provider HPI:   78 y.o. female with medical history significant of hypertension, paroxysmal atrial fibrillation, CHF, GERD, cirrhosis who presents to the emergency department due to 3 days onset of increasing shortness of breath which worsens with ambulation and was associated with chest congestion, cough and increased work of breathing.  Cough was intermittently productive with white phlegm.  She states that she has been using home inhaler with minimal relief.   ED Course:  In the emergency department, she was tachypneic with respiratory rate of 25/min, pulse 59 bpm, BP 156/75, O2 sat 97% on supplemental oxygen at 2 LPM, temperature 99.2 F.  Workup in the ED showed normocytic anemia, BMP was normal except for potassium of 3.4, troponin 18 > 17, BNP 137.  Influenza A, B, SARS (2, RSV was negative.  Chest x-ray showed cardiomegaly with mild interstitial edema.  Breathing treatment was provided, IV Solu-Medrol  120 mg x 1 was given.  TRH was asked to admit patient  Hospital Course by listed problems addressed  New onset Acute HFpEF  Chest x-ray showed cardiomegaly with mild interstitial edema Repeat CXR showed improvement in congestion and edema We monitored total input/output, daily weights and fluid restriction Treated with IV lasix  then transitioned to oral lasix    Continue heart healthy diet and fluid restriction     Echocardiogram done in 11/2019 which showed LVEF of 66 5%.  No RWMA.  Mild LVH.  LV diastolic parameters were normal.   Echo 02/07/24: LVEF 65-70% with mild asymmetric septal hypertrophy. Indeterminate diastolic function.  Pt will discharge home on daily oral lasix  20 mg daily, spironolactone  25 mg losartan  100 mg  Follow up with PCP in 1 week for recheck, labs was recommended  Acute exacerbation COPD Acute respiratory failure with hypoxia Pt was treated with duo nebs, Mucinex , Solu-Medrol , azithromycin . Pt was treated with Protonix  to prevent steroid-induced ulcer Continue incentive spirometry and flutter valve Continue supplemental oxygen to maintain O2 sat > 94% with plan to wean patient off oxygen as tolerated DC home on oral prednisone   Pt has clinically improved    Elevated troponin probably secondary to type II demand ischemia Troponin 18 > 17; this has flattened   Hypokalemia K+ 3.4, this was replenished   Essential hypertension Continue losartan  100 mg daily    GERD Treated with Protonix    Obesity class III (BMI 40.94) Patient was counseled about the cardiovascular and metabolic risk of morbid obesity. Patient was counseled for diet control, exercise regimen and weight loss.    Discharge Diagnoses:  Principal Problem:   New onset of congestive heart failure (HCC) Active Problems:   Essential hypertension   Worsening headaches   GERD (gastroesophageal reflux disease)   Hypokalemia   Acute respiratory failure with hypoxia (HCC)   Acute exacerbation of chronic obstructive pulmonary disease (COPD) (HCC)   Elevated troponin   Obesity, Class III, BMI 40-49.9 (morbid obesity)   Discharge Instructions: Discharge Instructions  Ambulatory referral to Physical Therapy   Complete by: As directed       Allergies as of 02/10/2024       Reactions   Amoxicillin Rash   Darvocet [propoxyphene N-acetaminophen ] Other (See Comments)   Hurts ears and side of face     Latex Swelling   Other Other (See Comments)   Hairspray(isoaplus)-causes big soars in head   Penicillins Other (See Comments)   Has patient had a PCN reaction causing immediate rash, facial/tongue/throat swelling, SOB or lightheadedness with hypotension: No Has patient had a PCN reaction causing severe rash involving mucus membranes or skin necrosis: No Has patient had a PCN reaction that required hospitalization No Has patient had a PCN reaction occurring within the last 10 years: No If all of the above answers are NO, then may proceed with Cephalosporin use. Hurts ears and side of face    Tetracyclines & Related Other (See Comments)   Hurts ears and side of face   Adhesive [tape] Itching, Rash   Telemetry pads cause itching and rash at site        Medication List     STOP taking these medications    Klor-Con  M20 20 MEQ tablet Generic drug: potassium chloride  SA   losartan -hydrochlorothiazide  100-25 MG tablet Commonly known as: HYZAAR       TAKE these medications    albuterol  108 (90 Base) MCG/ACT inhaler Commonly known as: VENTOLIN  HFA Inhale 1-2 puffs into the lungs every 4 (four) hours as needed for wheezing or shortness of breath.   aspirin  EC 81 MG tablet Take 81 mg by mouth daily.   azithromycin  250 MG tablet Commonly known as: ZITHROMAX  Take 1 tablet (250 mg total) by mouth daily for 2 doses. Start taking on: February 11, 2024   butalbital -acetaminophen -caffeine  50-325-40 MG tablet Commonly known as: FIORICET  Take 1 tablet by mouth every 6 (six) hours as needed for headache or migraine. MAX dose is 6 tablets per day   cholecalciferol  25 MCG (1000 UNIT) tablet Commonly known as: VITAMIN D3 Take 1,000 Units by mouth daily.   DULoxetine  60 MG capsule Commonly known as: CYMBALTA  Take 60 mg by mouth daily.   Fish Oil 1000 MG Caps Take 1,000 mg by mouth daily.   fluticasone  50 MCG/ACT nasal spray Commonly known as: FLONASE  Place 2 sprays into the nose  daily as needed for allergies.   furosemide  20 MG tablet Commonly known as: LASIX  Take 1 tablet (20 mg total) by mouth daily. Start taking on: February 11, 2024   gabapentin  300 MG capsule Commonly known as: NEURONTIN  Take 300 mg by mouth at bedtime.   guaiFENesin -dextromethorphan  100-10 MG/5ML syrup Commonly known as: ROBITUSSIN DM Take 10 mLs by mouth every 4 (four) hours as needed for cough.   HYDROcodone -acetaminophen  7.5-325 MG tablet Commonly known as: NORCO Take 1 tablet by mouth 3 (three) times daily as needed.   hydrOXYzine  10 MG tablet Commonly known as: ATARAX  Take 10 mg by mouth every 8 (eight) hours as needed for itching.   ipratropium-albuterol  0.5-2.5 (3) MG/3ML Soln Commonly known as: DUONEB Take 3 mLs by nebulization every 4 (four) hours as needed (shortness of breath or wheezing). What changed: when to take this   ketoconazole 2 % shampoo Commonly known as: NIZORAL Apply 1 Application topically 2 (two) times a week.   losartan  100 MG tablet Commonly known as: COZAAR  Take 1 tablet (100 mg total) by mouth daily. What changed:  medication strength how much to take  omeprazole 40 MG capsule Commonly known as: PRILOSEC Take 40 mg by mouth daily.   predniSONE  50 MG tablet Commonly known as: DELTASONE  Take 1 tablet (50 mg total) by mouth daily with breakfast for 5 days. Start taking on: February 11, 2024 What changed:  medication strength See the new instructions.   spironolactone  25 MG tablet Commonly known as: ALDACTONE  Take 25 mg by mouth daily.   Trelegy Ellipta 100-62.5-25 MCG/ACT Aepb Generic drug: Fluticasone -Umeclidin-Vilant Inhale 1 puff into the lungs daily.   verapamil  120 MG tablet Commonly known as: CALAN  Take 120 mg by mouth daily.        Follow-up Information     Fanta, Benita Area, MD. Schedule an appointment as soon as possible for a visit in 1 week(s).   Specialty: Internal Medicine Why: Hospital Follow Up Contact  information: [address] [place] [address] [phone]                Allergies  Allergen Reactions   Amoxicillin Rash   Darvocet [Propoxyphene N-Acetaminophen ] Other (See Comments)    Hurts ears and side of face    Latex Swelling   Other Other (See Comments)    Hairspray(isoaplus)-causes big soars in head   Penicillins Other (See Comments)    Has patient had a PCN reaction causing immediate rash, facial/tongue/throat swelling, SOB or lightheadedness with hypotension: No Has patient had a PCN reaction causing severe rash involving mucus membranes or skin necrosis: No Has patient had a PCN reaction that required hospitalization No Has patient had a PCN reaction occurring within the last 10 years: No If all of the above answers are NO, then may proceed with Cephalosporin use. Hurts ears and side of face    Tetracyclines & Related Other (See Comments)    Hurts ears and side of face   Adhesive [Tape] Itching and Rash    Telemetry pads cause itching and rash at site   Allergies as of 02/10/2024       Reactions   Amoxicillin Rash   Darvocet [propoxyphene N-acetaminophen ] Other (See Comments)   Hurts ears and side of face    Latex Swelling   Other Other (See Comments)   Hairspray(isoaplus)-causes big soars in head   Penicillins Other (See Comments)   Has patient had a PCN reaction causing immediate rash, facial/tongue/throat swelling, SOB or lightheadedness with hypotension: No Has patient had a PCN reaction causing severe rash involving mucus membranes or skin necrosis: No Has patient had a PCN reaction that required hospitalization No Has patient had a PCN reaction occurring within the last 10 years: No If all of the above answers are NO, then may proceed with Cephalosporin use. Hurts ears and side of face    Tetracyclines & Related Other (See Comments)   Hurts ears and side of face   Adhesive [tape] Itching, Rash   Telemetry pads cause itching and  rash at site        Medication List     STOP taking these medications    Klor-Con  M20 20 MEQ tablet Generic drug: potassium chloride  SA   losartan -hydrochlorothiazide  100-25 MG tablet Commonly known as: HYZAAR       TAKE these medications    albuterol  108 (90 Base) MCG/ACT inhaler Commonly known as: VENTOLIN  HFA Inhale 1-2 puffs into the lungs every 4 (four) hours as needed for wheezing or shortness of breath.   aspirin  EC 81 MG tablet Take 81 mg by mouth daily.   azithromycin  250 MG  tablet Commonly known as: ZITHROMAX  Take 1 tablet (250 mg total) by mouth daily for 2 doses. Start taking on: February 11, 2024   butalbital -acetaminophen -caffeine  50-325-40 MG tablet Commonly known as: FIORICET  Take 1 tablet by mouth every 6 (six) hours as needed for headache or migraine. MAX dose is 6 tablets per day   cholecalciferol  25 MCG (1000 UNIT) tablet Commonly known as: VITAMIN D3 Take 1,000 Units by mouth daily.   DULoxetine  60 MG capsule Commonly known as: CYMBALTA  Take 60 mg by mouth daily.   Fish Oil 1000 MG Caps Take 1,000 mg by mouth daily.   fluticasone  50 MCG/ACT nasal spray Commonly known as: FLONASE  Place 2 sprays into the nose daily as needed for allergies.   furosemide  20 MG tablet Commonly known as: LASIX  Take 1 tablet (20 mg total) by mouth daily. Start taking on: February 11, 2024   gabapentin  300 MG capsule Commonly known as: NEURONTIN  Take 300 mg by mouth at bedtime.   guaiFENesin -dextromethorphan  100-10 MG/5ML syrup Commonly known as: ROBITUSSIN DM Take 10 mLs by mouth every 4 (four) hours as needed for cough.   HYDROcodone -acetaminophen  7.5-325 MG tablet Commonly known as: NORCO Take 1 tablet by mouth 3 (three) times daily as needed.   hydrOXYzine  10 MG tablet Commonly known as: ATARAX  Take 10 mg by mouth every 8 (eight) hours as needed for itching.   ipratropium-albuterol  0.5-2.5 (3) MG/3ML Soln Commonly known as: DUONEB Take 3 mLs by  nebulization every 4 (four) hours as needed (shortness of breath or wheezing). What changed: when to take this   ketoconazole 2 % shampoo Commonly known as: NIZORAL Apply 1 Application topically 2 (two) times a week.   losartan  100 MG tablet Commonly known as: COZAAR  Take 1 tablet (100 mg total) by mouth daily. What changed:  medication strength how much to take   omeprazole 40 MG capsule Commonly known as: PRILOSEC Take 40 mg by mouth daily.   predniSONE  50 MG tablet Commonly known as: DELTASONE  Take 1 tablet (50 mg total) by mouth daily with breakfast for 5 days. Start taking on: February 11, 2024 What changed:  medication strength See the new instructions.   spironolactone  25 MG tablet Commonly known as: ALDACTONE  Take 25 mg by mouth daily.   Trelegy Ellipta 100-62.5-25 MCG/ACT Aepb Generic drug: Fluticasone -Umeclidin-Vilant Inhale 1 puff into the lungs daily.   verapamil  120 MG tablet Commonly known as: CALAN  Take 120 mg by mouth daily.        Procedures/Studies: DG CHEST PORT 1 VIEW Result Date: 02/08/2024 CLINICAL DATA:  Pulmonary edema EXAM: PORTABLE CHEST 1 VIEW COMPARISON:  February 06, 2024 FINDINGS: Residual interstitial prominence however, interval improvement in the pulmonary infiltrates correlate with improving congestive changes and pulmonary edema No pleural effusions Heart and mediastinum within normal limits IMPRESSION: Improving congestive changes and pulmonary edema. Electronically Signed   By: Franky Chard M.D.   On: 02/08/2024 07:00   CT HEAD WO CONTRAST ( ) Result Date: 02/07/2024 CLINICAL DATA:  Headache, increasing frequency or severity EXAM: CT HEAD WITHOUT CONTRAST TECHNIQUE: Contiguous axial images were obtained from the base of the skull through the vertex without intravenous contrast. RADIATION DOSE REDUCTION: This exam was performed according to the departmental dose-optimization program which includes automated exposure control, adjustment of  the mA and/or kV according to patient size and/or use of iterative reconstruction technique. COMPARISON:  CT head June 28, 2022. FINDINGS: Brain: No evidence of acute infarction, hemorrhage, hydrocephalus, extra-axial collection or mass lesion/mass effect. Vascular: No  hyperdense vessel. Skull: No acute fracture. Sinuses/Orbits: Clear sinuses.  No acute orbital findings. Other: No mastoid effusions. IMPRESSION: No evidence of acute intracranial abnormality. Electronically Signed   By: Gilmore GORMAN Molt M.D.   On: 02/07/2024 22:59   ECHOCARDIOGRAM COMPLETE Result Date: 02/07/2024    ECHOCARDIOGRAM REPORT   Patient Name:   Megan Odom Rae Date of Exam: 02/07/2024 Medical Rec #:  [id number]          Height:       65.0 in Accession #:    [id number]         Weight:       246.0 lb Date of Birth:  19-Sep-1945           BSA:          2.160 m Patient Age:    78 years           BP:           188/76 mmHg Patient Gender: F                  HR:           61 bpm. Exam Location:  [facility] Procedure: 2D Echo, Cardiac Doppler and Color Doppler (Both Spectral and Color            Flow Doppler were utilized during procedure). Indications:    CHF  History:        Patient has prior history of Echocardiogram examinations. Risk                 Factors:Hypertension.  Sonographer:    Vella Key Referring Phys: [id number] OLADAPO ADEFESO IMPRESSIONS  1. Left ventricular ejection fraction, by estimation, is 65 to 70%. The left ventricle has normal function. The left ventricle has no regional wall motion abnormalities. There is mild asymmetric left ventricular hypertrophy of the septal segment. Left ventricular diastolic parameters are indeterminate.  2. Right ventricular systolic function is normal. The right ventricular size is normal. Tricuspid regurgitation signal is inadequate for assessing PA pressure.  3. Left atrial size was mild to moderately dilated.  4. The mitral valve is degenerative. Mild mitral valve regurgitation.  5. The  aortic valve is tricuspid. There is mild calcification of the aortic valve. Aortic valve regurgitation is trivial. Aortic valve sclerosis/calcification is present, without any evidence of aortic stenosis.  6. The inferior vena cava is normal in size with greater than 50% respiratory variability, suggesting right atrial pressure of 3 mmHg. Comparison(s): Prior images reviewed side by side. LVEF 65-70% with mild asymmetric septal hypertrophy. Indeterminate diastolic function. FINDINGS  Left Ventricle: Left ventricular ejection fraction, by estimation, is 65 to 70%. The left ventricle has normal function. The left ventricle has no regional wall motion abnormalities. The left ventricular internal cavity size was normal in size. There is  mild asymmetric left ventricular hypertrophy of the septal segment. Left ventricular diastolic parameters are indeterminate. Right Ventricle: The right ventricular size is normal. No increase in right ventricular wall thickness. Right ventricular systolic function is normal. Tricuspid regurgitation signal is inadequate for assessing PA pressure. Left Atrium: Left atrial size was mild to moderately dilated. Right Atrium: Right atrial size was normal in size. Pericardium: There is no evidence of pericardial effusion. Mitral Valve: The mitral valve is degenerative in appearance. Mild mitral annular calcification. Mild mitral valve regurgitation. Tricuspid Valve: The tricuspid valve is grossly normal. Tricuspid valve regurgitation is mild. Aortic Valve: The aortic valve is tricuspid. There is mild  calcification of the aortic valve. There is mild to moderate aortic valve annular calcification. Aortic valve regurgitation is trivial. Aortic valve sclerosis/calcification is present, without any evidence of aortic stenosis. Pulmonic Valve: The pulmonic valve was grossly normal. Pulmonic valve regurgitation is trivial. Aorta: The aortic root and ascending aorta are structurally normal, with no  evidence of dilitation. Venous: The inferior vena cava is normal in size with greater than 50% respiratory variability, suggesting right atrial pressure of 3 mmHg. IAS/Shunts: No atrial level shunt detected by color flow Doppler. Additional Comments: 3D was performed not requiring image post processing on an independent workstation and was indeterminate.  LEFT VENTRICLE PLAX 2D LVIDd:         4.30 cm      Diastology LVIDs:         2.90 cm      LV e' medial:    4.35 cm/s LV PW:         1.00 cm      LV E/e' medial:  15.5 LV IVS:        1.20 cm      LV e' lateral:   5.52 cm/s LVOT diam:     1.70 cm      LV E/e' lateral: 12.2 LV SV:         69 LV SV Index:   32 LVOT Area:     2.27 cm  LV Volumes (MOD) LV vol d, MOD A2C: 98.5 ml LV vol d, MOD A4C: 101.0 ml LV vol s, MOD A2C: 34.3 ml LV vol s, MOD A4C: 50.4 ml LV SV MOD A2C:     64.2 ml LV SV MOD A4C:     101.0 ml LV SV MOD BP:      57.2 ml RIGHT VENTRICLE RV Basal diam:  3.90 cm RV S prime:     12.30 cm/s TAPSE (M-mode): 2.5 cm LEFT ATRIUM             Index        RIGHT ATRIUM           Index LA diam:        4.60 cm 2.13 cm/m   RA Area:     17.90 cm LA Vol (A2C):   78.0 ml 36.11 ml/m  RA Volume:   46.60 ml  21.57 ml/m LA Vol (A4C):   84.9 ml 39.30 ml/m LA Biplane Vol: 83.4 ml 38.61 ml/m  AORTIC VALVE LVOT Vmax:   147.00 cm/s LVOT Vmean:  106.000 cm/s LVOT VTI:    0.302 m  AORTA Ao Root diam: 3.20 cm Ao Asc diam:  3.30 cm MITRAL VALVE MV Area (PHT): 2.35 cm    SHUNTS MV Decel Time: 323 msec    Systemic VTI:  0.30 m MV E velocity: 67.60 cm/s  Systemic Diam: 1.70 cm MV A velocity: 99.90 cm/s MV E/A ratio:  0.68 Jayson Sierras MD Electronically signed by Jayson Sierras MD Signature Date/Time: 02/07/2024/2:01:49 PM    Final    DG Chest Portable 1 View Result Date: 02/06/2024 EXAM: 1 VIEW XRAY OF THE CHEST 02/06/2024 11:55:00 PM COMPARISON: 09/15/2023 CLINICAL HISTORY: sob. Productive coughing, SHOB FINDINGS: LUNGS AND PLEURA: Mild interstitial edema. No definite  pleural effusions. HEART AND MEDIASTINUM: Cardiomegaly. BONES AND SOFT TISSUES: No acute osseous abnormality. IMPRESSION: 1. Cardiomegaly with mild interstitial edema. 2. No definite pleural effusions. Electronically signed by: Pinkie Pebbles MD 02/06/2024 11:59 PM EDT RP Workstation: [id number]   MM 3D SCREENING MAMMOGRAM BILATERAL BREAST Result  Date: 01/23/2024 CLINICAL DATA:  Screening. EXAM: DIGITAL SCREENING BILATERAL MAMMOGRAM WITH TOMOSYNTHESIS AND CAD TECHNIQUE: Bilateral screening digital craniocaudal and mediolateral oblique mammograms were obtained. Bilateral screening digital breast tomosynthesis was performed. The images were evaluated with computer-aided detection. Best images possible per technologist communication. COMPARISON:  Previous exam(s). ACR Breast Density Category b: There are scattered areas of fibroglandular density. FINDINGS: There are no findings suspicious for malignancy. IMPRESSION: No mammographic evidence of malignancy. A result letter of this screening mammogram will be mailed directly to the patient. RECOMMENDATION: Screening mammogram in one year. (Code:SM-B-01Y) BI-RADS CATEGORY  1: Negative. Electronically Signed   By: Corean Salter M.D.   On: 01/23/2024 12:34     Subjective: Pt says she is breathing much better today, feels much like back to her baseline. No complaints.   Discharge Exam: Vitals:   02/10/24 0742 02/10/24 0748  BP:    Pulse:    Resp:    Temp:    SpO2: 100% 100%   Vitals:   02/10/24 0517 02/10/24 0742 02/10/24 0748 02/10/24 0801  BP: (!) 156/70     Pulse: (!) 58     Resp: 19     Temp: 98.1 F (36.7 C)     TempSrc: Oral     SpO2: 98% 100% 100%   Weight:    114.3 kg  Height:       General: Pt is alert, awake, not in acute distress Cardiovascular: normal S1/S2 +, no rubs, no gallops Respiratory: BBS full with rare wheezing, no rhonchi Abdominal: Soft, NT, ND, bowel sounds + Extremities: trace pretibial edema, no cyanosis    The results of significant diagnostics from this hospitalization (including imaging, microbiology, ancillary and laboratory) are listed below for reference.     Microbiology: Recent Results (from the past 240 hours)  Resp panel by RT-PCR (RSV, Flu A&B, Covid) Anterior Nasal Swab     Status: None   Collection Time: 02/07/24 12:14 AM   Specimen: Anterior Nasal Swab  Result Value Ref Range Status   SARS Coronavirus 2 by RT PCR NEGATIVE NEGATIVE Final    Comment: (NOTE) SARS-CoV-2 target nucleic acids are NOT DETECTED.  The SARS-CoV-2 RNA is generally detectable in upper respiratory specimens during the acute phase of infection. The lowest concentration of SARS-CoV-2 viral copies this assay can detect is 138 copies/mL. A negative result does not preclude SARS-Cov-2 infection and should not be used as the sole basis for treatment or other patient management decisions. A negative result may occur with  improper specimen collection/handling, submission of specimen other than nasopharyngeal swab, presence of viral mutation(s) within the areas targeted by this assay, and inadequate number of viral copies(<138 copies/mL). A negative result must be combined with clinical observations, patient history, and epidemiological information. The expected result is Negative.  Fact Sheet for Patients:  BloggerCourse.com  Fact Sheet for Healthcare Providers:  SeriousBroker.it  This test is no t yet approved or cleared by the [place]  FDA and  has been authorized for detection and/or diagnosis of SARS-CoV-2 by FDA under an Emergency Use Authorization (EUA). This EUA will remain  in effect (meaning this test can be used) for the duration of the COVID-19 declaration under Section 564(b)(1) of the Act, 21 U.S.C.section 360bbb-3(b)(1), unless the authorization is terminated  or revoked sooner.       Influenza A by PCR NEGATIVE NEGATIVE Final    Influenza B by PCR NEGATIVE NEGATIVE Final    Comment: (NOTE) The Xpert Xpress SARS-CoV-2/FLU/RSV plus assay  is intended as an aid in the diagnosis of influenza from Nasopharyngeal swab specimens and should not be used as a sole basis for treatment. Nasal washings and aspirates are unacceptable for Xpert Xpress SARS-CoV-2/FLU/RSV testing.  Fact Sheet for Patients: BloggerCourse.com  Fact Sheet for Healthcare Providers: SeriousBroker.it  This test is not yet approved or cleared by the [place]  FDA and has been authorized for detection and/or diagnosis of SARS-CoV-2 by FDA under an Emergency Use Authorization (EUA). This EUA will remain in effect (meaning this test can be used) for the duration of the COVID-19 declaration under Section 564(b)(1) of the Act, 21 U.S.C. section 360bbb-3(b)(1), unless the authorization is terminated or revoked.     Resp Syncytial Virus by PCR NEGATIVE NEGATIVE Final    Comment: (NOTE) Fact Sheet for Patients: BloggerCourse.com  Fact Sheet for Healthcare Providers: SeriousBroker.it  This test is not yet approved or cleared by the [place]  FDA and has been authorized for detection and/or diagnosis of SARS-CoV-2 by FDA under an Emergency Use Authorization (EUA). This EUA will remain in effect (meaning this test can be used) for the duration of the COVID-19 declaration under Section 564(b)(1) of the Act, 21 U.S.C. section 360bbb-3(b)(1), unless the authorization is terminated or revoked.  Performed at [facility], [address] [place], [place] [address]      Labs: BNP (last 3 results) Recent Labs    02/07/24 0013  BNP 137.0*   Basic Metabolic Panel: Recent Labs  Lab 02/07/24 0013 02/07/24 0231 02/08/24 0503 02/09/24 0415 02/10/24 0445  NA 137  --  132* 132* 133*  K 3.4*  --  3.5 4.8 5.0  CL 104  --  102 104 106  CO2 22  --   21* 23 22  GLUCOSE 90  --  186* 196* 154*  BUN 13  --  22 26* 30*  CREATININE 0.88  --  1.20* 1.18* 1.16*  CALCIUM  8.3*  --  8.4* 8.6* 8.6*  MG  --  1.5* 1.6*  --  2.1  PHOS  --  3.4  --   --   --    Liver Function Tests: No results for input(s): AST, ALT, ALKPHOS, BILITOT, PROT, ALBUMIN in the last 168 hours. No results for input(s): LIPASE, AMYLASE in the last 168 hours. No results for input(s): AMMONIA in the last 168 hours. CBC: Recent Labs  Lab 02/07/24 0013 02/08/24 0503  WBC 7.7 13.1*  NEUTROABS 4.3  --   HGB 10.6* 10.8*  HCT 34.2* 35.2*  MCV 90.2 90.5  PLT 258 277   Cardiac Enzymes: No results for input(s): CKTOTAL, CKMB, CKMBINDEX, TROPONINI in the last 168 hours. BNP: Invalid input(s): POCBNP CBG: Recent Labs  Lab 02/09/24 1211 02/09/24 1611 02/09/24 2057 02/10/24 0730  GLUCAP 183* 119* 206* 183*   D-Dimer No results for input(s): DDIMER in the last 72 hours. Hgb A1c No results for input(s): HGBA1C in the last 72 hours. Lipid Profile No results for input(s): CHOL, HDL, LDLCALC, TRIG, CHOLHDL, LDLDIRECT in the last 72 hours. Thyroid function studies No results for input(s): TSH, T4TOTAL, T3FREE, THYROIDAB in the last 72 hours.  Invalid input(s): FREET3 Anemia work up No results for input(s): VITAMINB12, FOLATE, FERRITIN, TIBC, IRON, RETICCTPCT in the last 72 hours. Urinalysis    Component Value Date/Time   COLORURINE YELLOW 09/04/2023 2010   APPEARANCEUR CLEAR 09/04/2023 2010   LABSPEC 1.017 09/04/2023 2010   PHURINE 5.0 09/04/2023 2010   GLUCOSEU NEGATIVE 09/04/2023 2010   HGBUR  NEGATIVE 09/04/2023 2010   BILIRUBINUR NEGATIVE 09/04/2023 2010   KETONESUR NEGATIVE 09/04/2023 2010   PROTEINUR NEGATIVE 09/04/2023 2010   UROBILINOGEN 2.0 (H) 12/09/2014 1200   NITRITE NEGATIVE 09/04/2023 2010   LEUKOCYTESUR SMALL (A) 09/04/2023 2010   Sepsis Labs Recent Labs  Lab 02/07/24 0013  02/08/24 0503  WBC 7.7 13.1*   Microbiology Recent Results (from the past 240 hours)  Resp panel by RT-PCR (RSV, Flu A&B, Covid) Anterior Nasal Swab     Status: None   Collection Time: 02/07/24 12:14 AM   Specimen: Anterior Nasal Swab  Result Value Ref Range Status   SARS Coronavirus 2 by RT PCR NEGATIVE NEGATIVE Final    Comment: (NOTE) SARS-CoV-2 target nucleic acids are NOT DETECTED.  The SARS-CoV-2 RNA is generally detectable in upper respiratory specimens during the acute phase of infection. The lowest concentration of SARS-CoV-2 viral copies this assay can detect is 138 copies/mL. A negative result does not preclude SARS-Cov-2 infection and should not be used as the sole basis for treatment or other patient management decisions. A negative result may occur with  improper specimen collection/handling, submission of specimen other than nasopharyngeal swab, presence of viral mutation(s) within the areas targeted by this assay, and inadequate number of viral copies(<138 copies/mL). A negative result must be combined with clinical observations, patient history, and epidemiological information. The expected result is Negative.  Fact Sheet for Patients:  BloggerCourse.com  Fact Sheet for Healthcare Providers:  SeriousBroker.it  This test is no t yet approved or cleared by the [place]  FDA and  has been authorized for detection and/or diagnosis of SARS-CoV-2 by FDA under an Emergency Use Authorization (EUA). This EUA will remain  in effect (meaning this test can be used) for the duration of the COVID-19 declaration under Section 564(b)(1) of the Act, 21 U.S.C.section 360bbb-3(b)(1), unless the authorization is terminated  or revoked sooner.       Influenza A by PCR NEGATIVE NEGATIVE Final   Influenza B by PCR NEGATIVE NEGATIVE Final    Comment: (NOTE) The Xpert Xpress SARS-CoV-2/FLU/RSV plus assay is intended as an  aid in the diagnosis of influenza from Nasopharyngeal swab specimens and should not be used as a sole basis for treatment. Nasal washings and aspirates are unacceptable for Xpert Xpress SARS-CoV-2/FLU/RSV testing.  Fact Sheet for Patients: BloggerCourse.com  Fact Sheet for Healthcare Providers: SeriousBroker.it  This test is not yet approved or cleared by the [place]  FDA and has been authorized for detection and/or diagnosis of SARS-CoV-2 by FDA under an Emergency Use Authorization (EUA). This EUA will remain in effect (meaning this test can be used) for the duration of the COVID-19 declaration under Section 564(b)(1) of the Act, 21 U.S.C. section 360bbb-3(b)(1), unless the authorization is terminated or revoked.     Resp Syncytial Virus by PCR NEGATIVE NEGATIVE Final    Comment: (NOTE) Fact Sheet for Patients: BloggerCourse.com  Fact Sheet for Healthcare Providers: SeriousBroker.it  This test is not yet approved or cleared by the [place]  FDA and has been authorized for detection and/or diagnosis of SARS-CoV-2 by FDA under an Emergency Use Authorization (EUA). This EUA will remain in effect (meaning this test can be used) for the duration of the COVID-19 declaration under Section 564(b)(1) of the Act, 21 U.S.C. section 360bbb-3(b)(1), unless the authorization is terminated or revoked.  Performed at [facility], [address] [place], [place] [address]    Time coordinating discharge: 40 mins  SIGNED:  Afton Louder, MD  [facility] 02/10/2024, 10:47 AM How to contact the [facility] Attending or Consulting provider 7A - 7P or covering provider during after hours 7P -7A, for this patient?  Check the care team in [facility] and look for a) attending/consulting TRH provider listed and b) the TRH team listed Log into [email or web address] and use [place]'s universal  password to access. If you do not have the password, please contact the hospital operator. Locate the TRH provider you are looking for under [facility] and page to a number that you can be directly reached. If you still have difficulty reaching the provider, please page the [facility] (Director on Call) for the Hospitalists listed on amion for assistance.

## 2024-02-10 NOTE — Plan of Care (Signed)
  Problem: Pain Managment: Goal: General experience of comfort will improve and/or be controlled Outcome: Progressing   Problem: Safety: Goal: Ability to remain free from injury will improve Outcome: Progressing

## 2024-02-10 NOTE — Discharge Instructions (Signed)
 IMPORTANT INFORMATION: PAY CLOSE ATTENTION   PHYSICIAN DISCHARGE INSTRUCTIONS  Follow with Primary care provider  Benetta Spar, MD  and other consultants as instructed by your Hospitalist Physician  SEEK MEDICAL CARE OR RETURN TO EMERGENCY ROOM IF SYMPTOMS COME BACK, WORSEN OR NEW PROBLEM DEVELOPS   Please note: You were cared for by a hospitalist during your hospital stay. Every effort will be made to forward records to your primary care provider.  You can request that your primary care provider send for your hospital records if they have not received them.  Once you are discharged, your primary care physician will handle any further medical issues. Please note that NO REFILLS for any discharge medications will be authorized once you are discharged, as it is imperative that you return to your primary care physician (or establish a relationship with a primary care physician if you do not have one) for your post hospital discharge needs so that they can reassess your need for medications and monitor your lab values.  Please get a complete blood count and chemistry panel checked by your Primary MD at your next visit, and again as instructed by your Primary MD.  Get Medicines reviewed and adjusted: Please take all your medications with you for your next visit with your Primary MD  Laboratory/radiological data: Please request your Primary MD to go over all hospital tests and procedure/radiological results at the follow up, please ask your primary care provider to get all Hospital records sent to his/her office.  In some cases, they will be blood work, cultures and biopsy results pending at the time of your discharge. Please request that your primary care provider follow up on these results.  If you are diabetic, please bring your blood sugar readings with you to your follow up appointment with primary care.    Please call and make your follow up appointments as soon as possible.     Also Note the following: If you experience worsening of your admission symptoms, develop shortness of breath, life threatening emergency, suicidal or homicidal thoughts you must seek medical attention immediately by calling 911 or calling your MD immediately  if symptoms less severe.  You must read complete instructions/literature along with all the possible adverse reactions/side effects for all the Medicines you take and that have been prescribed to you. Take any new Medicines after you have completely understood and accpet all the possible adverse reactions/side effects.   Do not drive when taking Pain medications or sleeping medications (Benzodiazepines)  Do not take more than prescribed Pain, Sleep and Anxiety Medications. It is not advisable to combine anxiety,sleep and pain medications without talking with your primary care practitioner  Special Instructions: If you have smoked or chewed Tobacco  in the last 2 yrs please stop smoking, stop any regular Alcohol  and or any Recreational drug use.  Wear Seat belts while driving.  Do not drive if taking any narcotic, mind altering or controlled substances or recreational drugs or alcohol.

## 2024-02-12 ENCOUNTER — Telehealth: Payer: Self-pay

## 2024-02-12 NOTE — Transitions of Care (Post Inpatient/ED Visit) (Signed)
 02/12/2024  Patient ID: Megan Odom, female   DOB: 02-06-1946, 78 y.o.   MRN: [id number]   Situation: Patient with recent hospital discharge.     Background:  Patient with COPD and new diagnosed heart failure.      Assessment: Patient  reports she is not feeling any better.  She reports worse coughing, shortness of breath and frequent urination to the point she cannot hold her urine. She says she just does not feel good.  Discussed with patient heart failure and worsening and COPD exacerbation and need for evaluation.  Patient reports she is to see PCP on Thursday.  Advised patient that she  needs attention today.  She verbalized understanding.      Recommendation: RN CM advised patient to go to the ER now.  Patient declined CM calling 911.  She states she is going to the ED. Advised patient that CM would monitor for ED visit and outreach as appropriate depending on if she is readmitted.  She verbalized understanding.     Liseth Wann J. Glynn Freas RN, MSN [facility], [facility] Health RN Care Manager Direct Dial: [phone]  Fax: [phone] Website: [email or web address]

## 2024-02-13 ENCOUNTER — Telehealth: Payer: Self-pay

## 2024-02-13 NOTE — Transitions of Care (Post Inpatient/ED Visit) (Signed)
   02/13/2024  Name: Megan Odom MRN: [id number] DOB: 05-Nov-1945  Today's TOC FU Call Status: Today's TOC FU Call Status:: Unsuccessful Call (2nd Attempt) Unsuccessful Call (2nd Attempt) Date: 02/13/24 [facility] FU Call Complete Date: 02/13/24  Attempted to reach the patient regarding the most recent Inpatient/ED visit.  Follow Up Plan: Additional outreach attempts will be made to reach the patient to complete the Transitions of Care (Post Inpatient/ED visit) call.   Cooper Moroney J. Garritt Molyneux RN, MSN [facility], [facility] Health RN Care Manager Direct Dial: [phone]  Fax: [phone] Website: [email or web address]

## 2024-02-14 ENCOUNTER — Telehealth: Payer: Self-pay

## 2024-02-14 DIAGNOSIS — J9620 Acute and chronic respiratory failure, unspecified whether with hypoxia or hypercapnia: Secondary | ICD-10-CM | POA: Diagnosis not present

## 2024-02-14 DIAGNOSIS — N390 Urinary tract infection, site not specified: Secondary | ICD-10-CM | POA: Diagnosis not present

## 2024-02-14 DIAGNOSIS — I10 Essential (primary) hypertension: Secondary | ICD-10-CM | POA: Diagnosis not present

## 2024-02-14 DIAGNOSIS — F17200 Nicotine dependence, unspecified, uncomplicated: Secondary | ICD-10-CM | POA: Diagnosis not present

## 2024-02-14 DIAGNOSIS — I5021 Acute systolic (congestive) heart failure: Secondary | ICD-10-CM | POA: Diagnosis not present

## 2024-02-14 DIAGNOSIS — J441 Chronic obstructive pulmonary disease with (acute) exacerbation: Secondary | ICD-10-CM | POA: Diagnosis not present

## 2024-02-14 NOTE — Transitions of Care (Post Inpatient/ED Visit) (Signed)
   02/14/2024  Name: MELLODY MASRI MRN: [id number] DOB: 23-Jan-1946  Today's TOC FU Call Status: Today's TOC FU Call Status:: Unsuccessful Call (3rd Attempt) Unsuccessful Call (3rd Attempt) Date: 02/14/24  Attempted to reach the patient regarding the most recent Inpatient/ED visit.  Follow Up Plan: No further outreach attempts will be made at this time. We have been unable to contact the patient.  Brennen Gardiner J. Rosey Eide RN, MSN [facility], [facility] Health RN Care Manager Direct Dial: [phone]  Fax: [phone] Website: [email or web address]

## 2024-02-21 ENCOUNTER — Encounter (HOSPITAL_COMMUNITY): Payer: Self-pay

## 2024-02-21 ENCOUNTER — Ambulatory Visit (HOSPITAL_COMMUNITY)

## 2024-02-21 NOTE — Therapy (Signed)
 [facility] Outpatient Rehabilitation at [facility] [address] [place], [place], [address] Phone: [phone]   Fax:  [phone]  Patient Details  Name: AZIZA STUCKERT MRN: [id number] Date of Birth: [birth year] Referring Provider:  No ref. provider found  Encounter Date: 02/21/2024  Called patient regarding missed OP PT evaluation. Pt was transferred to front office to be rescheduled.   Omega JONETTA Bottcher, PT 02/21/2024, 10:04 AM  [facility] Outpatient Rehabilitation at [facility] [address] [place], [place], [address] Phone: [phone]   Fax:  [phone]

## 2024-02-28 DIAGNOSIS — M791 Myalgia, unspecified site: Secondary | ICD-10-CM | POA: Diagnosis not present

## 2024-03-15 DIAGNOSIS — T2027XA Burn of second degree of neck, initial encounter: Secondary | ICD-10-CM | POA: Diagnosis not present

## 2024-03-15 DIAGNOSIS — I10 Essential (primary) hypertension: Secondary | ICD-10-CM | POA: Diagnosis not present

## 2024-03-15 DIAGNOSIS — T2220XA Burn of second degree of shoulder and upper limb, except wrist and hand, unspecified site, initial encounter: Secondary | ICD-10-CM | POA: Diagnosis not present

## 2024-03-15 DIAGNOSIS — F331 Major depressive disorder, recurrent, moderate: Secondary | ICD-10-CM | POA: Diagnosis not present

## 2024-04-15 DIAGNOSIS — F331 Major depressive disorder, recurrent, moderate: Secondary | ICD-10-CM | POA: Diagnosis not present

## 2024-04-15 DIAGNOSIS — I10 Essential (primary) hypertension: Secondary | ICD-10-CM | POA: Diagnosis not present

## 2024-04-29 DIAGNOSIS — M5416 Radiculopathy, lumbar region: Secondary | ICD-10-CM | POA: Diagnosis not present

## 2024-04-29 DIAGNOSIS — M7551 Bursitis of right shoulder: Secondary | ICD-10-CM | POA: Diagnosis not present

## 2024-04-29 DIAGNOSIS — M47816 Spondylosis without myelopathy or radiculopathy, lumbar region: Secondary | ICD-10-CM | POA: Diagnosis not present

## 2024-04-29 DIAGNOSIS — G894 Chronic pain syndrome: Secondary | ICD-10-CM | POA: Diagnosis not present

## 2024-04-30 ENCOUNTER — Other Ambulatory Visit (HOSPITAL_COMMUNITY): Payer: Self-pay | Admitting: Physical Medicine and Rehabilitation

## 2024-04-30 DIAGNOSIS — M5416 Radiculopathy, lumbar region: Secondary | ICD-10-CM

## 2024-05-01 ENCOUNTER — Encounter (HOSPITAL_COMMUNITY): Payer: Self-pay

## 2024-05-01 ENCOUNTER — Ambulatory Visit (HOSPITAL_COMMUNITY)

## 2024-05-10 DIAGNOSIS — H5203 Hypermetropia, bilateral: Secondary | ICD-10-CM | POA: Diagnosis not present

## 2024-05-11 ENCOUNTER — Ambulatory Visit (HOSPITAL_COMMUNITY)
Admission: RE | Admit: 2024-05-11 | Discharge: 2024-05-11 | Disposition: A | Discharge status: Home / Self Care | Source: Ambulatory Visit | Attending: Physical Medicine and Rehabilitation | Admitting: Physical Medicine and Rehabilitation

## 2024-05-11 DIAGNOSIS — M5416 Radiculopathy, lumbar region: Secondary | ICD-10-CM

## 2024-05-15 ENCOUNTER — Encounter: Payer: Self-pay | Admitting: Internal Medicine

## 2024-05-15 ENCOUNTER — Ambulatory Visit: Admitting: Internal Medicine

## 2024-05-15 VITALS — BP 158/90 | HR 64 | Ht 67.0 in | Wt 234.6 lb

## 2024-05-15 DIAGNOSIS — Z754 Unavailability and inaccessibility of other helping agencies: Secondary | ICD-10-CM | POA: Insufficient documentation

## 2024-05-15 DIAGNOSIS — Z79899 Other long term (current) drug therapy: Secondary | ICD-10-CM | POA: Insufficient documentation

## 2024-05-15 DIAGNOSIS — I5032 Chronic diastolic (congestive) heart failure: Secondary | ICD-10-CM | POA: Insufficient documentation

## 2024-05-15 MED ORDER — NIFEDIPINE ER OSMOTIC RELEASE 90 MG PO TB24: 90.0000 mg | ORAL_TABLET | Freq: Every day | ORAL | 5 refills | Status: AC

## 2024-05-15 MED ORDER — DAPAGLIFLOZIN PROPANEDIOL 10 MG PO TABS: 10.0000 mg | ORAL_TABLET | Freq: Every day | ORAL | 5 refills | Status: AC

## 2024-05-15 NOTE — Patient Instructions (Addendum)
 Medication Instructions:  Your physician has recommended you make the following change in your medication:  Start taking Farxiga  10 mg once daily Stop taking Verapamil   Start taking Nifedipine  90 mg once daily Continue taking all other medications as prescribed   Labwork: BMET in one week at [facility] in [place]   Testing/Procedures: None  Follow-Up: Your physician recommends that you schedule a follow-up appointment in: 3 months  Any Other Special Instructions Will Be Listed Below (If Applicable).  Placed referral to Social Worker  Thank you for choosing [facility]!     If you need a refill on your cardiac medications before your next appointment, please call your pharmacy.

## 2024-05-15 NOTE — Progress Notes (Signed)
 Cardiology Office Note  Date: 05/15/2024   ID: [name], Rothschild Jan 07, 1946, MRN [id number]  PCP:  Carlette Benita Area, MD  Cardiologist:  None Electrophysiologist:  None   History of Present Illness: Megan Odom is a 78 y.o. female  Referred to cardiology clinic for posthospitalization follow-up visit.  Patient was admitted to [facility] for the management of acute COPD exacerbation and acute on chronic diastolic heart failure exacerbation, diuresed with IV Lasix  and discharged on p.o. Lasix .  She also has history of CKD stage IIIb, serum creatinine 1.2 with a GFR 40s.  She reports that she has no job and no income.  She is worried about her ability to pay for her medications.  She does not have any angina but reports having dyspnea on exertion.  No dizziness, syncope, leg swelling.  She reports having severe headaches.  Past Medical History:  Diagnosis Date   Arthritis    Asthma    Cirrhosis (HCC)    Depression    Hep C w/o coma, chronic (HCC)    eradicated with Harvoni 2015.    Hyperlipemia    Hypertension     Past Surgical History:  Procedure Laterality Date   ABDOMINAL HYSTERECTOMY     APPENDECTOMY     BREAST SURGERY     traumatic injury   CHOLECYSTECTOMY     COLONOSCOPY N/A 07/03/2014   Dr. Shaaron: 5 mm and 1 cm tubular adenomas removed, diverticulosis.  Next colonoscopy in 2018.    Current Outpatient Medications  Medication Sig Dispense Refill   albuterol  (VENTOLIN  HFA) 108 (90 Base) MCG/ACT inhaler Inhale 1-2 puffs into the lungs every 4 (four) hours as needed for wheezing or shortness of breath. 8 g 2   aspirin  EC 81 MG tablet Take 81 mg by mouth daily.       butalbital -acetaminophen -caffeine  (FIORICET ) 50-325-40 MG tablet Take 1 tablet by mouth every 6 (six) hours as needed for headache or migraine. MAX dose is 6 tablets per day 10 tablet 0   cholecalciferol  (VITAMIN D3) 25 MCG (1000 UNIT) tablet Take 1,000 Units by mouth daily.     DULoxetine   (CYMBALTA ) 60 MG capsule Take 60 mg by mouth daily.      fluticasone  (FLONASE ) 50 MCG/ACT nasal spray Place 2 sprays into the nose daily as needed for allergies.      furosemide  (LASIX ) 20 MG tablet Take 1 tablet (20 mg total) by mouth daily. 30 tablet 1   gabapentin  (NEURONTIN ) 300 MG capsule Take 300 mg by mouth at bedtime.     HYDROcodone -acetaminophen  (NORCO) 7.5-325 MG tablet Take 1 tablet by mouth 3 (three) times daily as needed.     hydrOXYzine  (ATARAX ) 10 MG tablet Take 10 mg by mouth every 8 (eight) hours as needed for itching.     ipratropium-albuterol  (DUONEB) 0.5-2.5 (3) MG/3ML SOLN Take 3 mLs by nebulization every 4 (four) hours as needed (shortness of breath or wheezing). 360 mL 2   ketoconazole (NIZORAL) 2 % shampoo Apply 1 Application topically 2 (two) times a week.     losartan  (COZAAR ) 100 MG tablet Take 1 tablet (100 mg total) by mouth daily. 30 tablet 1   Omega-3 Fatty Acids (FISH OIL) 1000 MG CAPS Take 1,000 mg by mouth daily.     omeprazole (PRILOSEC) 40 MG capsule Take 40 mg by mouth daily.      spironolactone  (ALDACTONE ) 25 MG tablet Take 25 mg by mouth daily.      TRELEGY ELLIPTA 100-62.5-25  MCG/ACT AEPB Inhale 1 puff into the lungs daily.     verapamil  (CALAN ) 120 MG tablet Take 120 mg by mouth daily.     No current facility-administered medications for this visit.   Allergies:  Amoxicillin, Darvocet [propoxyphene n-acetaminophen ], Latex, Other, Penicillins, Tetracyclines & related, and Adhesive [tape]   Social History: The patient  reports that she has been smoking cigarettes. She has a 25 pack-year smoking history. She has never used smokeless tobacco. She reports that she does not drink alcohol  and does not use drugs.   Family History: The patient's family history includes Alcohol  abuse in her mother and sister; Cancer in her maternal aunt and maternal uncle; Diabetes in her brother and brother; Early death in her father and mother; HIV in her brother; Heart disease  in her brother, brother, and maternal aunt; Hepatitis C in her daughter; Hypertension in her maternal aunt, maternal uncle, and son.   ROS:  Please see the history of present illness. Otherwise, complete review of systems is positive for none  All other systems are reviewed and negative.   Physical Exam: VS:  BP (!) 158/90   Pulse 64   Ht 5' 7 (1.702 m)   Wt 234 lb 9.6 oz (106.4 kg)   SpO2 96%   BMI 36.74 kg/m , BMI Body mass index is 36.74 kg/m.  Wt Readings from Last 3 Encounters:  05/15/24 234 lb 9.6 oz (106.4 kg)  02/10/24 251 lb 15.8 oz (114.3 kg)  09/04/23 242 lb 8.1 oz (110 kg)    General: Patient appears comfortable at rest. HEENT: Conjunctiva and lids normal, oropharynx clear with moist mucosa. Neck: Supple, no elevated JVP or carotid bruits, no thyromegaly. Lungs: Clear to auscultation, nonlabored breathing at rest. Cardiac: Regular rate and rhythm, no S3 or significant systolic murmur, no pericardial rub. Abdomen: Soft, nontender, no hepatomegaly, bowel sounds present, no guarding or rebound. Extremities: No pitting edema, distal pulses 2+. Skin: Warm and dry. Musculoskeletal: No kyphosis. Neuropsychiatric: Alert and oriented x3, affect grossly appropriate.  Recent Labwork: 02/07/2024: B Natriuretic Peptide 137.0 02/08/2024: Hemoglobin 10.8; Platelets 277 02/10/2024: BUN 30; Creatinine, Ser 1.16; Magnesium  2.1; Potassium 5.0; Sodium 133     Component Value Date/Time   TRIG 92 11/22/2019 0418     Assessment and Plan:  Chronic diastolic heart failure - Aggressive control of HTN recommended. - Continue p.o. Lasix  20 mg once daily. - Start Farxiga  10 mg once daily.  Obtain BMP in 5 days. - Continue losartan  100 mg once daily. - Continue spironolactone  25 mg once daily.  HTN, poorly controlled - Home blood pressures range around 160 mmHg SBP. - Discontinue verapamil , start nifedipine  90 mg once daily. - Continue p.o. Lasix  20 mg once daily. - Continue losartan   100 mg once daily. - Continue spironolactone  25 mg once daily.  Paroxysmal atrial tachycardia - No evidence of atrial fibrillation anywhere in the chart.  Her event monitor recorded atrial tachycardia but not A-fib.  No occasion for systemic AC at this time.  Barriers to care - Patient states she does not have any job or money.  Will place social worker consult.  40 minutes spent in review the prior records, imaging, test/labs/reports, discussion of the above problems with the patient, documentation and answering all her questions.  Medication Adjustments/Labs and Tests Ordered: Current medicines are reviewed at length with the patient today.  Concerns regarding medicines are outlined above.    Disposition:  Follow up 3 months  Signed Shontia Gillooly Priya Danaisha Celli, MD,  05/15/2024 2:10 PM    [facility] at [facility] [address] [place], [place], [place] [address]

## 2024-05-15 NOTE — Addendum Note (Signed)
 Addended by: JOHNNYE LITTIE HERO on: 05/15/2024 03:05 PM   Modules accepted: Orders

## 2024-05-16 ENCOUNTER — Telehealth: Payer: Self-pay | Admitting: Licensed Clinical Social Worker

## 2024-05-16 DIAGNOSIS — I10 Essential (primary) hypertension: Secondary | ICD-10-CM | POA: Diagnosis not present

## 2024-05-16 DIAGNOSIS — F331 Major depressive disorder, recurrent, moderate: Secondary | ICD-10-CM | POA: Diagnosis not present
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
## 2024-05-16 NOTE — Progress Notes (Deleted)
  Heart and Vascular Care Navigation  05/16/2024  Megan Odom [birth year] [id number]  Reason for Referral:    Engaged with patient by telephone for initial visit for Heart and Vascular Care Coordination.                                                                                                   Assessment:                                      HRT/VAS Care Coordination     Living arrangements for the past 2 months Single Family Home   Lives with: Adult Children   Home Assistive Devices/Equipment None       Social History:                                                                             SDOH Screenings   Food Insecurity: Food Insecurity Present (05/16/2024)  Housing: High Risk (05/16/2024)  Transportation Needs: No Transportation Needs (05/16/2024)  Utilities: At Risk (05/16/2024)  Alcohol  Screen: Low Risk  (09/23/2019)  Depression (PHQ2-9): Low Risk  (09/23/2019)  Financial Resource Strain: High Risk (05/16/2024)  Physical Activity: Inactive (09/23/2019)  Social Connections: Moderately Isolated (02/07/2024)  Stress: Stress Concern Present (09/23/2019)  Tobacco Use: High Risk (05/15/2024)  Health Literacy: Adequate Health Literacy (05/16/2024)    SDOH Interventions: Financial Resources:  Surveyor, quantity Strain Interventions: Programmer, applications Provided DSS for financial assistance and [employer] for [employer] programs and Extra Help  Food Insecurity:  Food Insecurity Interventions: [employer] Provided  Housing Insecurity:  Housing Interventions: Programmer, applications Provided  Transportation:   Transportation Interventions: Intervention Not Indicated     Other Care Navigation Interventions:     Provided Pharmacy assistance resources  Pt denies any issues with medications but may benefit from Extra Help assistance   Follow-up plan:

## 2024-05-29 ENCOUNTER — Telehealth: Payer: Self-pay | Admitting: Licensed Clinical Social Worker

## 2024-05-29 NOTE — Telephone Encounter (Signed)
 H&V Care Navigation CSW Progress Note  Clinical Social Worker contacted patient by phone to f/u on community resources sent. Was able to reach Megan Odom today at [phone]. Megan Odom shares she returned to work and then her daughter went to the hospital and she spent the last few days going back and forth to [facility] to see her. She shares fatigue from all this back and forth and is upset to have been out of work further. She hasn't reviewed resources sent, asks for help with whatever we can.   Encouraged her to review documents as I share again that there are resources for utilities and food in packet, Megan Odom also should go to DSS to speak with caseworkers about SNAP and any other benefits given household includes someone receiving disability, someone 65+ and a baby. I clarified again that we aren't able to assist with utilities that have been disconnected and that the only way we can use our Patient Care Fund is if she meets criteria per our fund guidelines (homeless, uninsured or receiving SNAP). The other resource sent that is important is the [facility] flyer and encouraged Megan Odom to call their counseling team to ensure that she has best plan for her needs and they can assist during enrollment period in October to answer any questions about transitioning plans. Megan Odom states understanding, will f/u to see if any progress on resources sent.  Patient is participating in a Managed Medicaid Plan:  No, [employer] (Medicare)  SDOH Screenings   Food Insecurity: Food Insecurity Present (05/16/2024)  Housing: High Risk (05/16/2024)  Transportation Needs: No Transportation Needs (05/16/2024)  Utilities: At Risk (05/16/2024)  Alcohol  Screen: Low Risk  (09/23/2019)  Depression (PHQ2-9): Low Risk  (09/23/2019)  Financial Resource Strain: High Risk (05/16/2024)  Physical Activity: Inactive (09/23/2019)  Social Connections: Moderately Isolated (02/07/2024)  Stress: Stress Concern Present (09/23/2019)  Tobacco Use: High Risk (05/15/2024)   Health Literacy: Adequate Health Literacy (05/16/2024)    Marit Lark, MSW, LCSW Clinical Social Worker II [facility] Heart/Vascular Care Navigation  [phone]- work cell phone (preferred)

## 2024-06-10 ENCOUNTER — Telehealth: Payer: Self-pay | Admitting: Licensed Clinical Social Worker

## 2024-06-10 NOTE — Telephone Encounter (Signed)
 H&V Care Navigation CSW Progress Note  Clinical Social Worker contacted patient by phone to f/u on resources sent for utilities/housing. Reached pt at [phone]. Pt shares she is back to work and still receiving her social security income but struggling to make ends meet. LCSW confirmed pt did review resources but no one will help. Between rent and car payment and car insurance pt states she has no money for utilities.  Attempted to discuss who she had called and it is difficult to tell who she spoke with. Pt still without gas due to $1200 past due bill. She asks for assistance with her power bill stating it is $700, however without SNAP benefits we are unable to use Patient Care Fund consideration and cannot re-connect utilities. Unfortunately, also note per [employer] pt bill is over $1000 at this time and pt unable to pay any per her report at this time. Will send her public housing list for [place], encouraged her to go in person to DSS/have other households living with her (I.e her grandson and his infant) to go in person to ask about any programs they may be eligible for. Will also send information about Medical Necessity program through [facility] [employer] as that may help prevent disconnection as well.   Let pt know that she is doing the best she can, apologized we havent been able to locate a program to get utilities re-established, will ensure she receives additional resources.   Patient is participating in a Managed Medicaid Plan:  No, [employer] medicare  SDOH Screenings   Food Insecurity: Food Insecurity Present (05/16/2024)  Housing: High Risk (05/16/2024)  Transportation Needs: No Transportation Needs (05/16/2024)  Utilities: At Risk (05/16/2024)  Alcohol  Screen: Low Risk  (09/23/2019)  Depression (PHQ2-9): Low Risk  (09/23/2019)  Financial Resource Strain: High Risk (05/16/2024)  Physical Activity: Inactive (09/23/2019)  Social Connections: Moderately Isolated (02/07/2024)  Stress: Stress  Concern Present (09/23/2019)  Tobacco Use: High Risk (05/15/2024)  Health Literacy: Adequate Health Literacy (05/16/2024)    Marit Lark, MSW, LCSW Clinical Social Worker II [facility] Heart/Vascular Care Navigation  [phone]- work cell phone (preferred)

## 2024-06-24 ENCOUNTER — Telehealth: Payer: Self-pay | Admitting: Licensed Clinical Social Worker

## 2024-06-24 NOTE — Telephone Encounter (Signed)
 H&V Care Navigation CSW Progress Note  Clinical Social Worker mailed pt additional food resources to home address. Have not had any f/u calls from pt for assistance at this time.  Patient is participating in a Managed Medicaid Plan:  No A[employer] Medicare  SDOH Screenings   Food Insecurity: Food Insecurity Present (05/16/2024)  Housing: High Risk (05/16/2024)  Transportation Needs: No Transportation Needs (05/16/2024)  Utilities: At Risk (05/16/2024)  Alcohol  Screen: Low Risk  (09/23/2019)  Depression (PHQ2-9): Low Risk  (09/23/2019)  Financial Resource Strain: High Risk (05/16/2024)  Physical Activity: Inactive (09/23/2019)  Social Connections: Moderately Isolated (02/07/2024)  Stress: Stress Concern Present (09/23/2019)  Tobacco Use: High Risk (05/15/2024)  Health Literacy: Adequate Health Literacy (05/16/2024)    Marit Lark, MSW, LCSW Clinical Social Worker II [facility] Heart/Vascular Care Navigation  [phone]- work cell phone (preferred)

## 2024-07-08 ENCOUNTER — Emergency Department (HOSPITAL_COMMUNITY)
Admission: EM | Admit: 2024-07-08 | Discharge: 2024-07-08 | Disposition: A | Discharge status: Home / Self Care | Attending: Emergency Medicine | Admitting: Emergency Medicine

## 2024-07-08 ENCOUNTER — Emergency Department (HOSPITAL_COMMUNITY)

## 2024-07-08 ENCOUNTER — Other Ambulatory Visit: Payer: Self-pay

## 2024-07-08 ENCOUNTER — Encounter (HOSPITAL_COMMUNITY): Payer: Self-pay

## 2024-07-08 DIAGNOSIS — R109 Unspecified abdominal pain: Secondary | ICD-10-CM | POA: Diagnosis not present

## 2024-07-08 DIAGNOSIS — D72829 Elevated white blood cell count, unspecified: Secondary | ICD-10-CM | POA: Insufficient documentation

## 2024-07-08 DIAGNOSIS — J4489 Other specified chronic obstructive pulmonary disease: Secondary | ICD-10-CM | POA: Insufficient documentation

## 2024-07-08 DIAGNOSIS — R519 Headache, unspecified: Secondary | ICD-10-CM | POA: Insufficient documentation

## 2024-07-08 DIAGNOSIS — Z91040 Latex allergy status: Secondary | ICD-10-CM | POA: Diagnosis not present

## 2024-07-08 DIAGNOSIS — Y92511 Restaurant or cafe as the place of occurrence of the external cause: Secondary | ICD-10-CM | POA: Diagnosis not present

## 2024-07-08 DIAGNOSIS — I509 Heart failure, unspecified: Secondary | ICD-10-CM | POA: Insufficient documentation

## 2024-07-08 DIAGNOSIS — Z7951 Long term (current) use of inhaled steroids: Secondary | ICD-10-CM | POA: Insufficient documentation

## 2024-07-08 DIAGNOSIS — Z79899 Other long term (current) drug therapy: Secondary | ICD-10-CM | POA: Diagnosis not present

## 2024-07-08 DIAGNOSIS — M79604 Pain in right leg: Secondary | ICD-10-CM | POA: Diagnosis not present

## 2024-07-08 DIAGNOSIS — I110 Hypertensive heart disease with heart failure: Secondary | ICD-10-CM | POA: Diagnosis not present

## 2024-07-08 DIAGNOSIS — Y900 Blood alcohol level of less than 20 mg/100 ml: Secondary | ICD-10-CM | POA: Insufficient documentation

## 2024-07-08 DIAGNOSIS — Z7982 Long term (current) use of aspirin: Secondary | ICD-10-CM | POA: Insufficient documentation

## 2024-07-08 DIAGNOSIS — W19XXXA Unspecified fall, initial encounter: Secondary | ICD-10-CM | POA: Insufficient documentation

## 2024-07-08 DIAGNOSIS — M25511 Pain in right shoulder: Secondary | ICD-10-CM | POA: Diagnosis present

## 2024-07-08 LAB — COMPREHENSIVE METABOLIC PANEL WITH GFR
ALT: 14 U/L (ref 0–44)
AST: 18 U/L (ref 15–41)
Albumin: 3.9 g/dL (ref 3.5–5.0)
Alkaline Phosphatase: 152 U/L — ABNORMAL HIGH (ref 38–126)
Anion gap: 13 (ref 5–15)
BUN: 15 mg/dL (ref 8–23)
CO2: 21 mmol/L — ABNORMAL LOW (ref 22–32)
Calcium: 9.1 mg/dL (ref 8.9–10.3)
Chloride: 106 mmol/L (ref 98–111)
Creatinine, Ser: 1.05 mg/dL — ABNORMAL HIGH (ref 0.44–1.00)
GFR, Estimated: 55 mL/min — ABNORMAL LOW (ref >60)
Glucose, Bld: 110 mg/dL — ABNORMAL HIGH (ref 70–99)
Potassium: 4.2 mmol/L (ref 3.5–5.1)
Sodium: 141 mmol/L (ref 135–145)
Total Bilirubin: 0.2 mg/dL (ref 0.0–1.2)
Total Protein: 8.2 g/dL — ABNORMAL HIGH (ref 6.5–8.1)

## 2024-07-08 LAB — URINE DRUG SCREEN
Amphetamines: NEGATIVE
Barbiturates: NEGATIVE
Benzodiazepines: NEGATIVE
Cocaine: NEGATIVE
Fentanyl: NEGATIVE
Methadone Scn, Ur: NEGATIVE
Opiates: NEGATIVE
Tetrahydrocannabinol: NEGATIVE

## 2024-07-08 LAB — URINALYSIS, ROUTINE W REFLEX MICROSCOPIC
Bilirubin Urine: NEGATIVE
Glucose, UA: >=500 mg/dL — AB
Hgb urine dipstick: NEGATIVE
Ketones, ur: NEGATIVE mg/dL
Nitrite: NEGATIVE
Protein, ur: NEGATIVE mg/dL
Specific Gravity, Urine: 1.01 (ref 1.005–1.030)
pH: 5 (ref 5.0–8.0)

## 2024-07-08 LAB — ETHANOL: Alcohol, Ethyl (B): <15 mg/dL (ref <15)

## 2024-07-08 LAB — CBC
HCT: 39.3 % (ref 36.0–46.0)
Hemoglobin: 12.4 g/dL (ref 12.0–15.0)
MCH: 28.2 pg (ref 26.0–34.0)
MCHC: 31.6 g/dL (ref 30.0–36.0)
MCV: 89.5 fL (ref 80.0–100.0)
Platelets: 351 K/uL (ref 150–400)
RBC: 4.39 MIL/uL (ref 3.87–5.11)
RDW: 13.2 % (ref 11.5–15.5)
WBC: 11.7 K/uL — ABNORMAL HIGH (ref 4.0–10.5)
nRBC: 0 % (ref 0.0–0.2)

## 2024-07-08 LAB — PROTIME-INR
INR: 1 (ref 0.8–1.2)
Prothrombin Time: 13.9 s (ref 11.4–15.2)

## 2024-07-08 LAB — MAGNESIUM: Magnesium: 2 mg/dL (ref 1.7–2.4)

## 2024-07-08 MED ORDER — DIPHENHYDRAMINE HCL 50 MG/ML IJ SOLN
12.5000 mg | Freq: Once | INTRAMUSCULAR | Status: AC
  Administered 2024-07-08: 12.5 mg via INTRAVENOUS
  Filled 2024-07-08: qty 1

## 2024-07-08 MED ORDER — KETOROLAC TROMETHAMINE 15 MG/ML IJ SOLN
15.0000 mg | Freq: Once | INTRAMUSCULAR | Status: AC
  Administered 2024-07-08: 15 mg via INTRAVENOUS
  Filled 2024-07-08: qty 1

## 2024-07-08 MED ORDER — METHOCARBAMOL 500 MG PO TABS: 500.0000 mg | ORAL_TABLET | Freq: Three times a day (TID) | ORAL | 0 refills | Status: AC | PRN

## 2024-07-08 MED ORDER — METHOCARBAMOL 500 MG PO TABS
500.0000 mg | ORAL_TABLET | Freq: Once | ORAL | Status: AC
  Administered 2024-07-08: 500 mg via ORAL
  Filled 2024-07-08: qty 1

## 2024-07-08 MED ORDER — FENTANYL CITRATE (PF) 100 MCG/2ML IJ SOLN
50.0000 ug | Freq: Once | INTRAMUSCULAR | Status: AC
  Administered 2024-07-08: 50 ug via INTRAVENOUS
  Filled 2024-07-08: qty 2

## 2024-07-08 MED ORDER — ACETAMINOPHEN 325 MG PO TABS
650.0000 mg | ORAL_TABLET | Freq: Once | ORAL | Status: AC
  Administered 2024-07-08: 650 mg via ORAL
  Filled 2024-07-08: qty 2

## 2024-07-08 MED ORDER — METOCLOPRAMIDE HCL 5 MG/ML IJ SOLN
10.0000 mg | Freq: Once | INTRAMUSCULAR | Status: AC
  Administered 2024-07-08: 10 mg via INTRAVENOUS
  Filled 2024-07-08: qty 2

## 2024-07-08 NOTE — ED Notes (Signed)
 ED Provider at bedside.

## 2024-07-08 NOTE — ED Triage Notes (Signed)
 Patient come in POV for complaint of a fall last night outside. Denises blood thinners. Stated  I was falling face down and protected my face with my hands whole right side is hurting and swelling to right leg.

## 2024-07-08 NOTE — Discharge Instructions (Signed)
 Your test results today are reassuring.  Continue home pain medications.  A prescription for muscle relaxer was sent to your pharmacy.  Take this as needed.  Follow-up with your PCP.  Return to the emergency department for any new or worsening symptoms of concern.
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
## 2024-07-09 ENCOUNTER — Encounter: Payer: Self-pay | Admitting: Internal Medicine

## 2024-08-13 ENCOUNTER — Ambulatory Visit: Admitting: Internal Medicine

## 2024-08-30 ENCOUNTER — Encounter: Payer: Self-pay | Admitting: *Deleted

## 2024-08-30 NOTE — Progress Notes (Signed)
 ANIKAH HOGGE                                          MRN: [id number]   08/30/2024   The [facility] Quality Team Specialist reviewed this patient medical record for the purposes of chart review for care gap closure. The following were reviewed: chart review for care gap closure-controlling blood pressure.    [facility] Quality Team

## 2024-10-04 ENCOUNTER — Ambulatory Visit: Admitting: Internal Medicine
